# Patient Record
Sex: Male | Born: 1960 | Race: Black or African American | Hispanic: No | Smoking: Never smoker
Health system: Southern US, Community
[De-identification: ages and names within clinical notes are randomized; demographics above are authoritative.]

## PROBLEM LIST (undated history)

## (undated) DIAGNOSIS — I10 Essential (primary) hypertension: Secondary | ICD-10-CM

## (undated) DIAGNOSIS — I639 Cerebral infarction, unspecified: Secondary | ICD-10-CM

## (undated) DIAGNOSIS — K219 Gastro-esophageal reflux disease without esophagitis: Secondary | ICD-10-CM

## (undated) HISTORY — PX: BACK SURGERY: SHX140

## (undated) HISTORY — PX: FOOT SURGERY: SHX648

## (undated) HISTORY — PX: HERNIA REPAIR: SHX51

---

## 1998-02-15 ENCOUNTER — Emergency Department (HOSPITAL_COMMUNITY): Admission: EM | Admit: 1998-02-15 | Discharge: 1998-02-15 | Payer: Self-pay | Admitting: Emergency Medicine

## 2000-01-30 ENCOUNTER — Emergency Department (HOSPITAL_COMMUNITY): Admission: EM | Admit: 2000-01-30 | Discharge: 2000-01-30 | Payer: Self-pay | Admitting: Emergency Medicine

## 2000-01-30 ENCOUNTER — Encounter: Payer: Self-pay | Admitting: Emergency Medicine

## 2001-01-05 ENCOUNTER — Encounter: Payer: Self-pay | Admitting: Emergency Medicine

## 2001-01-05 ENCOUNTER — Emergency Department (HOSPITAL_COMMUNITY): Admission: EM | Admit: 2001-01-05 | Discharge: 2001-01-05 | Payer: Self-pay | Admitting: Emergency Medicine

## 2002-03-24 ENCOUNTER — Emergency Department (HOSPITAL_COMMUNITY): Admission: EM | Admit: 2002-03-24 | Discharge: 2002-03-24 | Payer: Self-pay | Admitting: Emergency Medicine

## 2002-03-24 ENCOUNTER — Encounter: Payer: Self-pay | Admitting: Emergency Medicine

## 2005-12-24 ENCOUNTER — Inpatient Hospital Stay (HOSPITAL_COMMUNITY): Admission: EM | Admit: 2005-12-24 | Discharge: 2005-12-31 | Payer: Self-pay | Admitting: Neurology

## 2005-12-24 ENCOUNTER — Ambulatory Visit: Payer: Self-pay | Admitting: Physical Medicine & Rehabilitation

## 2005-12-24 ENCOUNTER — Encounter: Payer: Self-pay | Admitting: Emergency Medicine

## 2005-12-24 ENCOUNTER — Ambulatory Visit: Payer: Self-pay | Admitting: Internal Medicine

## 2005-12-29 ENCOUNTER — Encounter: Payer: Self-pay | Admitting: Vascular Surgery

## 2005-12-31 ENCOUNTER — Inpatient Hospital Stay (HOSPITAL_COMMUNITY)
Admission: RE | Admit: 2005-12-31 | Discharge: 2006-01-28 | Payer: Self-pay | Admitting: Physical Medicine & Rehabilitation

## 2006-01-08 ENCOUNTER — Encounter: Payer: Self-pay | Admitting: Vascular Surgery

## 2006-01-26 ENCOUNTER — Ambulatory Visit: Payer: Self-pay | Admitting: Physical Medicine & Rehabilitation

## 2006-02-02 ENCOUNTER — Encounter
Admission: RE | Admit: 2006-02-02 | Discharge: 2006-05-03 | Payer: Self-pay | Admitting: Physical Medicine & Rehabilitation

## 2006-03-02 ENCOUNTER — Ambulatory Visit: Payer: Self-pay | Admitting: Physical Medicine & Rehabilitation

## 2006-03-02 ENCOUNTER — Encounter
Admission: RE | Admit: 2006-03-02 | Discharge: 2006-05-31 | Payer: Self-pay | Admitting: Physical Medicine & Rehabilitation

## 2006-05-04 ENCOUNTER — Encounter
Admission: RE | Admit: 2006-05-04 | Discharge: 2006-08-02 | Payer: Self-pay | Admitting: Physical Medicine & Rehabilitation

## 2007-06-25 ENCOUNTER — Emergency Department (HOSPITAL_COMMUNITY): Admission: EM | Admit: 2007-06-25 | Discharge: 2007-06-25 | Payer: Self-pay | Admitting: Emergency Medicine

## 2007-10-05 ENCOUNTER — Emergency Department (HOSPITAL_COMMUNITY): Admission: EM | Admit: 2007-10-05 | Discharge: 2007-10-06 | Payer: Self-pay | Admitting: Emergency Medicine

## 2008-01-05 ENCOUNTER — Emergency Department (HOSPITAL_COMMUNITY): Admission: EM | Admit: 2008-01-05 | Discharge: 2008-01-05 | Payer: Self-pay | Admitting: Emergency Medicine

## 2008-05-05 ENCOUNTER — Ambulatory Visit (HOSPITAL_COMMUNITY): Admission: RE | Admit: 2008-05-05 | Discharge: 2008-05-05 | Payer: Self-pay | Admitting: Internal Medicine

## 2008-05-10 ENCOUNTER — Observation Stay (HOSPITAL_COMMUNITY): Admission: RE | Admit: 2008-05-10 | Discharge: 2008-05-11 | Payer: Self-pay | Admitting: General Surgery

## 2008-05-10 ENCOUNTER — Encounter (INDEPENDENT_AMBULATORY_CARE_PROVIDER_SITE_OTHER): Payer: Self-pay | Admitting: General Surgery

## 2008-05-11 ENCOUNTER — Emergency Department (HOSPITAL_COMMUNITY): Admission: EM | Admit: 2008-05-11 | Discharge: 2008-05-11 | Payer: Self-pay | Admitting: Emergency Medicine

## 2008-09-13 ENCOUNTER — Emergency Department (HOSPITAL_COMMUNITY): Admission: EM | Admit: 2008-09-13 | Discharge: 2008-09-14 | Payer: Self-pay | Admitting: Emergency Medicine

## 2008-10-25 ENCOUNTER — Ambulatory Visit (HOSPITAL_COMMUNITY): Admission: RE | Admit: 2008-10-25 | Discharge: 2008-10-25 | Payer: Self-pay | Admitting: Neurosurgery

## 2008-11-01 ENCOUNTER — Inpatient Hospital Stay (HOSPITAL_COMMUNITY): Admission: RE | Admit: 2008-11-01 | Discharge: 2008-11-03 | Payer: Self-pay | Admitting: Neurosurgery

## 2008-11-10 ENCOUNTER — Ambulatory Visit (HOSPITAL_COMMUNITY): Admission: RE | Admit: 2008-11-10 | Discharge: 2008-11-10 | Payer: Self-pay | Admitting: Neurosurgery

## 2009-02-25 ENCOUNTER — Emergency Department (HOSPITAL_COMMUNITY): Admission: EM | Admit: 2009-02-25 | Discharge: 2009-02-25 | Payer: Self-pay | Admitting: Emergency Medicine

## 2009-02-26 ENCOUNTER — Ambulatory Visit (HOSPITAL_COMMUNITY): Admission: RE | Admit: 2009-02-26 | Discharge: 2009-02-26 | Payer: Self-pay | Admitting: Emergency Medicine

## 2009-08-26 ENCOUNTER — Emergency Department (HOSPITAL_COMMUNITY): Admission: EM | Admit: 2009-08-26 | Discharge: 2009-08-26 | Payer: Self-pay | Admitting: Emergency Medicine

## 2009-11-23 ENCOUNTER — Ambulatory Visit (HOSPITAL_COMMUNITY): Admission: RE | Admit: 2009-11-23 | Discharge: 2009-11-23 | Payer: Self-pay | Admitting: Podiatry

## 2009-12-04 ENCOUNTER — Encounter (HOSPITAL_COMMUNITY): Admission: RE | Admit: 2009-12-04 | Discharge: 2010-01-03 | Payer: Self-pay | Admitting: Podiatry

## 2011-01-27 LAB — URINALYSIS, ROUTINE W REFLEX MICROSCOPIC
Bilirubin Urine: NEGATIVE
Glucose, UA: NEGATIVE mg/dL
Nitrite: NEGATIVE
Protein, ur: 100 mg/dL — AB
Specific Gravity, Urine: 1.015 (ref 1.005–1.030)
Urobilinogen, UA: 1 mg/dL (ref 0.0–1.0)
pH: 7 (ref 5.0–8.0)

## 2011-01-27 LAB — COMPREHENSIVE METABOLIC PANEL
Albumin: 4 g/dL (ref 3.5–5.2)
Albumin: 4.2 g/dL (ref 3.5–5.2)
Alkaline Phosphatase: 84 U/L (ref 39–117)
Alkaline Phosphatase: 111 U/L (ref 39–117)
BUN: 8 mg/dL (ref 6–23)
BUN: 9 mg/dL (ref 6–23)
CO2: 32 mEq/L (ref 19–32)
Calcium: 9.4 mg/dL (ref 8.4–10.5)
Calcium: 9.5 mg/dL (ref 8.4–10.5)
Creatinine, Ser: 1 mg/dL (ref 0.4–1.5)
GFR calc non Af Amer: >60 mL/min (ref >60)
Glucose, Bld: 80 mg/dL (ref 70–99)
Potassium: 3.3 mEq/L — ABNORMAL LOW (ref 3.5–5.1)
Potassium: 3.7 mEq/L (ref 3.5–5.1)
Total Bilirubin: 0.8 mg/dL (ref 0.3–1.2)
Total Protein: 6.8 g/dL (ref 6.0–8.3)
Total Protein: 8.3 g/dL (ref 6.0–8.3)

## 2011-01-27 LAB — CBC
HCT: 42.7 % (ref 39.0–52.0)
MCHC: 32.6 g/dL (ref 30.0–36.0)
MCV: 86.9 fL (ref 78.0–100.0)
Platelets: 360 10*3/uL (ref 150–400)
RDW: 15 % (ref 11.5–15.5)
WBC: 8.9 10*3/uL (ref 4.0–10.5)

## 2011-01-27 LAB — DIFFERENTIAL
Basophils Relative: 1 % (ref 0–1)
Basophils Relative: 0 % (ref 0–1)
Lymphocytes Relative: 21 % (ref 12–46)
Lymphocytes Relative: 21 % (ref 12–46)
Lymphs Abs: 1.8 10*3/uL (ref 0.7–4.0)
Lymphs Abs: 2.4 10*3/uL (ref 0.7–4.0)
Monocytes Absolute: 0.8 10*3/uL (ref 0.1–1.0)
Monocytes Relative: 8 % (ref 3–12)
Monocytes Relative: 7 % (ref 3–12)
Neutro Abs: 6.3 10*3/uL (ref 1.7–7.7)
Neutro Abs: 8 10*3/uL — ABNORMAL HIGH (ref 1.7–7.7)

## 2011-01-27 LAB — BASIC METABOLIC PANEL
BUN: 12 mg/dL (ref 6–23)
CO2: 29 mEq/L (ref 19–32)
Chloride: 101 mEq/L (ref 96–112)
Chloride: 98 mEq/L (ref 96–112)
Creatinine, Ser: 0.92 mg/dL (ref 0.4–1.5)
Creatinine, Ser: 1 mg/dL (ref 0.4–1.5)
GFR calc Af Amer: >60 mL/min (ref >60)
GFR calc non Af Amer: >60 mL/min (ref >60)
GFR calc non Af Amer: >60 mL/min (ref >60)
Glucose, Bld: 105 mg/dL — ABNORMAL HIGH (ref 70–99)
Potassium: 2.6 mEq/L — CL (ref 3.5–5.1)
Potassium: 2.6 mEq/L — CL (ref 3.5–5.1)
Sodium: 135 mEq/L (ref 135–145)
Sodium: 141 mEq/L (ref 135–145)

## 2011-01-27 LAB — APTT: aPTT: 45 seconds — ABNORMAL HIGH (ref 24–37)

## 2011-01-27 LAB — URINE MICROSCOPIC-ADD ON

## 2011-02-25 NOTE — Op Note (Signed)
NAMETHERAN, VANDERGRIFT            ACCOUNT NO.:  [id number]   MEDICAL RECORD NO.:  [id number]          PATIENT TYPE:  INP   LOCATION:  3004                         FACILITY:  [facility]   PHYSICIAN:  Payton Doughty, M.D.      DATE OF BIRTH:  07/22/1961   DATE OF PROCEDURE:  11/01/2008  DATE OF DISCHARGE:                               OPERATIVE REPORT   PREOPERATIVE DIAGNOSIS:  Herniated disk on the left side, L4-5.   POSTOPERATIVE DIAGNOSIS:  Herniated disk on the left side, L4-5.   OPERATIVE PROCEDURE:  Left L4-5 laminectomy, diskectomy.   SURGEON:  Payton Doughty, MD   PREPARATION:  Prepped and draped with alcohol wipe.   COMPLICATIONS:  None.   ASSISTANT:  Danae Orleans. Venetia Maxon, MD   BODY OF TEXT:  A 49 year old with the disk at 4-5 on the left, taken to  operating room, smoothly anesthetized and intubated, placed prone on the  operating table.  Following shave, prep, and drape in usual sterile  fashion, skin was infiltrated with 1% lidocaine with 1:400,00  epinephrine.  Skin was incised over the 4-5 interspace.  The left L4  lamina was dissected free in subperiosteal plane.  Intraoperative x-ray  confirmed correctness level.  Having confirmed correctness level, hemi-  semi-laminectomy was carried out to the top of ligamentum flavum that  was removed in a retrograde fashion.  This allowed exposure of the  lateral aspect of the thecal sac as well as the five root as it  traversed.  This area was gently retracted medially.  A large herniated  disk was found underneath it and removed without difficulty.  The disk  space was entered and other fragments were removed.  This resulted in  immediate decompression of left L5 root.  Root was explored carefully  and found to be free.  Wound was irrigated.  Hemostasis assured.  Depo-  Medrol soaked cotton was placed in the laminectomy defect.  Successive  layers of 0-Vicryl, 2-0 Vicryl, and 3-0 nylon were used to close.  Betadine and Telfa dressing was  applied, made occlusive with OpSite, and  the patient returned recovery room in good condition.           ______________________________  Payton Doughty, M.D.     MWR/MEDQ  D:  11/01/2008  T:  11/02/2008  Job:  [id number]

## 2011-02-25 NOTE — Op Note (Signed)
NAMELEOCADIO, HEAL            ACCOUNT NO.:  [id number]   MEDICAL RECORD NO.:  [id number]          PATIENT TYPE:  OBV   LOCATION:  A305                          FACILITY:  [facility]   PHYSICIAN:  Barbaraann Barthel, M.D. DATE OF BIRTH:  [birth year]   DATE OF PROCEDURE:  05/10/2008  DATE OF DISCHARGE:                               OPERATIVE REPORT   SURGEON:  Barbaraann Barthel, MD   PREOPERATIVE DIAGNOSIS:  Umbilical hernia.   POSTOPERATIVE DIAGNOSIS:  Umbilical hernia.   PROCEDURE:  Umbilical herniorrhaphy.   NOTE:  This is a 50 year old black male who is status post  cerebrovascular accident who had an incarcerated umbilical hernia for  quite some time.  We planned for an elective repair.  He was referred  from the [facility] [place].   GROSS OPERATIVE FINDINGS:  The patient had some incarcerated omentum and  small umbilical hernia defect which did not require mesh for repair.   TECHNIQUE:  The patient was placed in the supine position, and after the  adequate administration of general anesthesia via endotracheal  intubation his abdomen was prepped with Betadine solution and draped in  the usual manner.  A periumbilical incision was carried out over the  inferior aspect of the umbilicus.  The skin and subcutaneous tissue was  incised down the fascia.  The hernia sac was dissected free from the  skin of the umbilicus.  There was a small defect about the size of a  quarter, which was closed with interrupted 0 Polysorb suture.  This was  done using figure-of-eight sutures.  I then tacked the umbilicus down to  the fascia restoring its natural concave position, irrigated with normal  saline solution, and closed the skin with stapling device.  Prior to  closure, all sponge, needle, and instrument counts were found to be  correct.  Estimated blood loss was minimal.  The patient received 500 mL  of crystalloids intraoperatively.  No drains were placed.  There were no  complications.      Barbaraann Barthel, M.D.  Electronically Signed     WB/MEDQ  D:  05/10/2008  T:  05/10/2008  Job:  [id number]   cc:   [facility]

## 2011-02-25 NOTE — H&P (Signed)
NAMEKAZUMA, ELENA            ACCOUNT NO.:  [id number]   MEDICAL RECORD NO.:  [id number]          PATIENT TYPE:  INP   LOCATION:  3004                         FACILITY:  [facility]   PHYSICIAN:  Payton Doughty, M.D.      DATE OF BIRTH:  [birth year]   DATE OF ADMISSION:  11/01/2008  DATE OF DISCHARGE:                              HISTORY & PHYSICAL   ADMITTING DIAGNOSIS:  Herniated disk on the left side at L4-5.   SURGEON:  Payton Doughty, MD   BODY OF TEXT:  Dwaine Pringle is a 50 year old right-handed black  gentleman 6 months had increasing pain down his back to his left lower  extremity.  MR showed herniated disk at L4-5.  He was sent to me.  He  underwent epidural steroids to no avail and he is admitted now for  diskectomy.  He was going to be admitted last week because his potassium  was low, now it is okay and he is admitted.   MEDICAL HISTORY:  Remarkable for:  1. Stroke few years ago.  2. Left hemiparesis.  3. He has hypertension.   MEDICATIONS:  He is on Neurontin, omega-3 fish oil, potassium,  hydrochlorothiazide, simvastatin, Norvasc and vitamin E.   ALLERGIES:  None.   SURGICAL HISTORY:  Hernia.   SOCIAL HISTORY:  He does not smoke or drink.  He is on disability.   FAMILY HISTORY:  Mother 84 and father 104.  There is a history of  hypertension in the family.   REVIEW OF SYSTEMS:  Remarkable for hypertension, hypercholesterolemia,  swelling in hands and feet, leg pain, back pain, and arm pain.   PHYSICAL EXAMINATION:  HEENT:  Within normal limits.  NECK:  He has reasonable range of motion in neck.  CHEST:  Clear.  CARDIAC:  Regular rate and rhythm.  ABDOMEN: Large but nontender with no hepatosplenomegaly.  EXTREMITIES:  No clubbing, cyanosis.  GU:  Deferred.  Peripheral pulses are good.  NEUROLOGIC:  He is awake, alert and oriented.  His cranial nerves are  intact.  Motor exam shows 5/5 strength throughout the upper and lower  extremities except for the left  side which is hemiparetic from his  stroke.  He has a positive straight leg raise on the left.   MR shows herniated disk at L4-5 on the left.   CLINICAL IMPRESSION:  Left L5 radiculopathy related to herniated disk at  L4-5.   PLAN:  Left L4-5 laminectomy and diskectomy.  The risks and benefits  have been discussed with him and he wished to proceed.    .           ______________________________  Payton Doughty, M.D.     MWR/MEDQ  D:  11/01/2008  T:  11/02/2008  Job:  [id number]

## 2011-02-28 NOTE — H&P (Signed)
Scott Ritter, Scott Ritter            ACCOUNT NO.:  [id number]   MEDICAL RECORD NO.:  [id number]          PATIENT TYPE:  INP   LOCATION:  2909                         FACILITY:  [facility]   PHYSICIAN:  Marlan Palau, M.D.  DATE OF BIRTH:  [birth year]   DATE OF ADMISSION:  12/24/2005  DATE OF DISCHARGE:                                HISTORY & PHYSICAL   HISTORY OF PRESENT ILLNESS:  Scott Ritter is a 50 year old right-handed  black male born [birth year] with a history of hypertension followed by Dr.  Gareth Morgan in the [place] area.  Patient has been on a blood pressure  pill but family cannot remember the name of the medication.  Patient  otherwise has been in good health.  Patient was noted to have some problems  with speech, slight confusion beginning around 7:30 p.m. tonight.  Patient  went to the [facility] Room and underwent CT of the head showing  evidence of a right basilar gangliar hemorrhage without intraventricular  extension or significant shift.  Size of the hemorrhage was noted to be  around 32 x 33 mm.  Patient was transported via [facility] to [facility].  En route patient was noted to have agitation and then became  quite somnolent.  Patient developed bradycardia as well with rates in the  mid 50s.  Patient would occasionally drop down in the mid 40s.  Patient has  remained somnolent upon arrival to [facility].  Patient had been placed on a  Cardene drip initially.  This was discontinued en route and patient's blood  pressure has not elevated.  Heart rates, again, remained in the upper 50s to  around 60.  Patient is being admitted to stroke M.D.   PAST MEDICAL HISTORY:  1.  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  2.  Hypertension.   MEDICATIONS:  Blood pressure pill, unknown type.   Patient has no known allergies.  Not smoke or drink.   SOCIAL HISTORY:  This  patient is engaged to be married next week.  Works at  [employer].  Has one son, one daughter who are alive and well.   FAMILY HISTORY:  Mother is alive and well.  Father is alive, has history of  hypertension.  History of strokes on the father's side of the family.  Patient has four brothers and sisters who are alive and well.   REVIEW OF SYSTEMS:  Cannot be directly obtained from the patient.  Patient  did complain of a headache around the time the slurred speech began.  Developed a left hemiparesis.  Was really feeling fairly good prior to that.   PHYSICAL EXAMINATION:  VITAL SIGNS:  Blood pressure approximately 169/90,  heart rate 60, respiratory rate 18, temperature afebrile.  GENERAL:  This patient is a fairly well-developed, black male who is very  lethargic at the time of the examination.  HEENT:  Head is atraumatic.  Eyes:  Pupils are 2-3 mm, reactive.  Disks are  flat.  NECK:  Supple.  No carotid bruits noted.  RESPIRATORY:  Clear.  CARDIOVASCULAR:  Regular rate and rhythm.  No obvious murmur, rubs noted.  ABDOMEN:  Decreased bowel sounds.  No organomegaly or tenderness noted.  EXTREMITIES:  Without significant edema.  NEUROLOGIC:  Cranial nerves as above.  Facial symmetry reveals minimal  depression of the left nasal labial fold.  Patient at times has divergent  gaze, other times is able to conjugately deviate the eyes from one side to  the next.  Does not really blink from either side.  When stimulated patient  may say a word or two that is relatively unintelligible.  Will occasionally  follow some verbal commands, but then doze off to sleep.  Patient does seem  to grimace some to deep pain stimulation on all fours, more so on the right  than the left.  Patient has inability to maintain the left arm above head  against gravity.  Can perform this on the right.  Can grip with the right  hand.  Cannot keep the left leg up off the bed.  Can perform this to some  degree with  the right leg.  Deep tendon reflexes are depressed, but  symmetric. Toe on the left is neutral, maybe slightly upgoing toe on the  right.  Patient was not alert enough to follow cerebellar testing commands  and was really not alert enough to participate in a more detailed sensory  examination.  NIH stroke scale was 25.   LABORATORIES:  Notable for a white count of 9.7, hemoglobin of 12.7,  hematocrit of 37.8, MCV of 85.3, platelets of 306.  Sodium 140, potassium  3.1, chloride of 107, CO2 28, glucose of 111, BUN of 17, creatinine of 1.2,  calcium of 8.9.  Urine drug screen was negative.  Urinalysis revealed  specific gravity of 1.02, pH of 7.5, red cells 3-6, negative white cells.   Only medication given to the [facility] ER was 20 mg of labetalol and 2 mg of  morphine.  No other sedative medications were given.   EKG, chest x-ray are pending.   IMPRESSION:  1.  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  2.  History of hypertension.  3.  Hypokalemia on admission.   This patient appears to have a fairly significant deficit with the left  hemiparesis, but apparently has had a change in mental status en route with  initially some agitation, but now has significant lethargy/stuporous state  that may indicate an extension of the intracranial hemorrhage from the  original CT scan.  Need to pursue further testing at this point.  The  ensuing bradycardia may be a manifestation of increased intracranial  pressure.   PLAN:  1.  Repeat CT scan of the head.  2.  Neurosurgical consultation.  3.  Consider cardiology evaluation if bradycardia becomes significant.  4.  Replenish potassium.  5.  n.p.o.  6.  Will repeat blood work and CT scan of the head in a.m.  I have discussed      the case with the family.      Marlan Palau, M.D.  Electronically Signed    CKW/MEDQ  D:  12/25/2005  T:  12/25/2005  Job:  [id number]   cc:   Mila Homer. Sudie Bailey, M.D.  Fax:  [phone]   Garrison [place]. Yetta Barre, M.D.  Fax: [phone]

## 2011-02-28 NOTE — H&P (Signed)
NAMESAMMY, CASSAR            ACCOUNT NO.:  [id number]   MEDICAL RECORD NO.:  [id number]          PATIENT TYPE:  IPS   LOCATION:  4029                         FACILITY:  [facility]   PHYSICIAN:  Ranelle Oyster, M.D.DATE OF BIRTH:  09-10-1961   DATE OF ADMISSION:  12/31/2005  DATE OF DISCHARGE:                                HISTORY & PHYSICAL   CHIEF COMPLAINT:  Left-sided weakness, dysarthria, and dysphagia.   HISTORY OF PRESENT ILLNESS:  This is a 50 year old black male with history  of hypertension admitted to [facility] with speech problems and  mental status changes. On admission he was found to have a right basal  ganglia hemorrhage. The patient developed increasing left hemiparesis around  the time of his admit. The patient developed bradycardia which was felt to  be secondary to his Cardizem drip versus Cushing's reflex. The patient was  treated x1 with mannitol for increased intracranial pressure and lethargy  with improvement of mental status. The patient displayed ongoing improvement  in his mental status. He was found to have oral phase dysphagia with  spillage and is currently on D2 thin liquid diet. Followup head scan on  March 17 revealed stable right basal ganglia hematoma, approximately 6 mm  right-to-left midline shift. The patient had surrounding edema as well.   REVIEW OF SYSTEMS:  Negative for any shortness of breath, chest pain, sleep  difficulties. Other pertinent positives are listed above. The patient has  displayed a flat affect during this hospitalization.   PAST MEDICAL HISTORY:  Positive for hypertension. The patient has taken no  medications for a year.   FAMILY HISTORY:  Positive for stroke and hypertension.   SOCIAL HISTORY:  The patient lives with his fiance and three children in a  two-level house. The patient was independent prior to arrival and worked for  [employer].   MEDICATIONS PRIOR TO ARRIVAL:  Medications prior to arrival  which he had  stopped include Lotrel and hydrochlorothiazide. He uses ibuprofen and Aleve  p.r.n.   ALLERGIES:  No known drug allergies.   PHYSICAL EXAMINATION:  VITAL SIGNS:  Blood pressure is 124/82, pulse is 82,  respiratory rate is 16. He is afebrile.  GENERAL:  The patient is generally flat, in no acute distress.  HEENT:  Pupils equal, round, and reactive to light and accommodation.  Extraocular eye movements are intact. Ear, nose, and throat exam was  unremarkable except for poor dentition.  NECK:  Supple without JVD or lymphadenopathy.  CHEST:  Clear to auscultation bilaterally without wheezes, rales, or  rhonchi.  HEART:  Regular rate and rhythm without murmur, rubs, or gallops.  EXTREMITIES:  Show no clubbing, cyanosis, or edema.  ABDOMEN:  Soft, nontender, bowel sounds positive.  NEUROLOGIC:  Cranial nerve exam revealed a positive left central VII and  XII. He had left inattention today. His left arm and leg were flaccid. He  had 1+ reflexes at the elbow, knee, and ankle today. The patient had some  problems with his visual acuity. He was slow to respond and lacked  initiative and awareness, as well as attention today.  He is alert to name  and place. Memory was fair. There was a large mood component to his  participation in the neurological exam today. Right-sided motor function was  5/5 and as stated before, left side was 0/5.   ASSESSMENT AND PLAN:  1.  Functional deficit secondary to right basal ganglia hemorrhage with      flaccid left hemiparesis, dysphagia, inattention, and dysarthria. Begin      comprehensive inpatient rehab with PT, OT, and speech. PT will assess      for mobility, lower extremity strengthening. OT will assess for self      care and upper extremities. Speech will assess for swallowing,      dysphagia, and cognitive deficits. Nursing will manage bowel, bladder,      skin, and medications. Psychology to assess for mood. Nurse case manager      and  social work to assess for psychosocial needs. Estimated length of      stay is 3 weeks. Prognosis fair to good. Goal is supervision to minimum      assist.  2.  Deep venous thrombosis prophylaxis with TEDs and PAS hose.  3.  Hypertension. Continue hydrochlorothiazide daily.  4.  Acute renal insufficiency. Push p.o. fluids.  5.  Lethargy. The patient may benefit from Ritalin trial.  6.  Dyslipidemia:  Zocor.  7.  Hypokalemia:  Check admission labs.  8.  Hyperhomocysteinemia:  Continue Foltx.      Ranelle Oyster, M.D.  Electronically Signed     ZTS/MEDQ  D:  12/31/2005  T:  01/01/2006  Job:  [id number]

## 2011-02-28 NOTE — Assessment & Plan Note (Signed)
A 50 year old male with history of hypertension.  Had a right basal ganglia  intracranial hemorrhage on December 24, 2005.  He was admitted to  rehabilitation on December 31, 2005 and stayed in rehabilitation until January 28, 2006.  He was discharged to home using an AFO requiring total assistance  for ambulation.  He is now ambulating with a wheeled walker with supervision  assistance.  He has followed up with Dr. Donette Larry who has increased his  Altace, but otherwise kept all medications the same.  Patient has had no  falls.  He has had no aspiration.  He has had no seizures.  No depression.   He takes Tylenol at night for left shoulder pain.  Shoulder pain has  responded to OT scapular mobilization treatments.  Sleep is good.   He uses a wheelchair out in the community.  He has not worked.  He has not  driven since his stroke.   REVIEW OF SYSTEMS:  Swelling left foot.  Numbness and tingling in the left  fingers.   PHYSICAL EXAMINATION:  VITAL SIGNS:  Blood pressure 143/73, pulse 66,  respiratory rate 17, O2 saturation 99% room air.  GENERAL:  No acute distress.  Mood and affect appropriate.  NEUROLOGIC:  His left upper extremity has no evidence of subluxation.  He  has 2- at the shoulder retractors, trace at the biceps, trace shoulder  adduction on the left side.  He does have increased tone in the left  pectoralis musculature.  He has pain with passive external rotation of his  left humerus.   He has no swelling in his left hand.  No cyanosis or erythema.  No  hypersensitivity to touch.  His left lower extremity strength is 4- at the  knee extensor and 3- at the ankle dorsiflexor.  His gait is with stand-by  assistance, widened base support, decreased step length, and decreased toe  clearance on the left compared to the right side.   He has absent sensation to light touch in the left hand.  His visual  confrontation testing is normal.   IMPRESSION:  1.  Right intracranial hemorrhage  basal ganglia causing left upper      extremity, lower extremity spastic hemiplegia.  2.  Left hemisensory deficits secondary to above.  No visual neglect or      visual field cut noted.  3.  Dysphagia, improved.   PLAN:  1.  Continue PT and OT.  2.  Discussed driving.  3.  Discussed work/disability issues.  I think there is a possibility that      he can go back to a sedentary type of      position in six more months or so, but will need to monitor his      rehabilitation progress.  Discuss driving at next visit, but none until      then.  He will probably need OT driver's evaluation.      Erick Colace, M.D.  Electronically Signed     AEK/MedQ  D:  03/03/2006 10:54:45  T:  03/03/2006 15:07:55  Job #:  [id number]   cc:   Georgann Housekeeper, MD  Fax: [phone]   Pramod P. Pearlean Brownie, MD  Fax: [phone]

## 2011-02-28 NOTE — Discharge Summary (Signed)
NAMEHASSEN, Scott Ritter            ACCOUNT NO.:  [id number]   MEDICAL RECORD NO.:  [id number]          PATIENT TYPE:  IPS   LOCATION:  4149                         FACILITY:  [facility]   PHYSICIAN:  Erick Colace, M.D.DATE OF BIRTH:  [birth year]   DATE OF ADMISSION:  12/31/2005  DATE OF DISCHARGE:  01/28/2006                                 DISCHARGE SUMMARY   DISCHARGE DIAGNOSES:  1.  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  2.  Hypertension.  3.  Acute renal insufficiency, resolved.  4.  Pericarditis treated.  5.  Dyslipidemia.  6.  Hypokalemia, resolved.   HISTORY OF PRESENT ILLNESS:  Scott Ritter is a 50 year old male with a  history of hypertension, no BP medications for the last few months admitted  via [facility] with speech problem and mental status changes  secondary to right basal ganglia hemorrhage. The patient noted to have  bradycardia in route as well as left hemiparesis noted at admission. Neuro  consulted for input and has been following along with conservative  management with serial CT scans. Initially patient on Cardizem drip for BP  management. However, he developed bradycardia and [facility] was  consulted for input. They question bradycardia secondary to Cushing reflex  due to stress, voice the side effect of Cardizem drip. This patient is noted  to have increase in intracranial pressure as well as lethargy and was  treated with one dose Mannitol with improvement in mental status. Other  workup include check with carotid duplex showing no ICA stenosis. . The  patient's mentation improved. The patient was noted to have oral phase  dysphagia with spillage and is currently on D2 diet with thin liquids. The  patient continued with left hemiparesis, restricted upward gaze, dysarthria  and inattention.   LABORATORY DATA:  CT of head from March 17 showed stable right basal ganglia  hematoma with 6 mm right to left midline shift  with surrounding edema. The  patient also noted to have elevated homocystine level at 17.5 and was  started on Foltx. Currently the patient continues with hypersomnia and  lethargy. Blood pressures borderline control with some renal insufficiency.  BUN 25, creatinine 1.2 onHCl  50 mg a day. Rehab consulted for progressive  therapy secondary to impairment and self care and mobility.   PAST MEDICAL HISTORY:  Significant for hypertension not treated for past few  months.   ALLERGIES:  No known drug allergies.   FAMILY HISTORY:  Positive for CVA and hypertension.   SOCIAL HISTORY:  The patient lives with fiance and 2 children in a two-level  home with 3 steps at entry, 5 steps to bedroom. Does not use any tobacco,  uses alcohol occasionally.   HOSPITAL COURSE:  Scott Ritter was admitted to rehab on December 31, 2005. The patient's therapies to consist of PT/OT daily. Past admission  followup labs done revealed hemoglobin 14.0, hematocrit 41.0, white count  14.6, platelets 461. Check of lytes showed mild renal insufficiency with BUN  24, creatinine 1.1. The patient's HCTZ was decreased to 25 mg q.a.m. and  patient was started  on Altace 2.5 mg p.o. per day for BP control. On March  26, the patient was noted to have a temperature spike of 101.1. Blood  cultures x2 were drawn including check of labs showing patient with  worsening renal insufficiency with BUN 30, creatinine 1.1, leukocytosis  noted with white count at 15.4. Chest x-ray done showed left basilar  atelectasis and small left effusion. A followup CT of head was done as  question edema pressure as cause of fever. CT of head showed right basal  ganglia, hematoma to be slightly decreased with decrease in subfalcine  herniation. The patient was noted to have some fullness of the right face  with fever and leukocytosis. This was contributed to pericarditis. He was  treated with IV cefuroxime from March 28 to April 2 and also  hydrated with  IV fluids. Bilateral lower extremity Doppler's were also done to rule out  DVT secondary to the patient's left hemiparesis. This was noted to be  negative. On April 2, the patient was changed over to p.o. amoxicillin;  however, this did not provide good coverage as the patient respiked temp on  April 6. He was changed over to Augmentin 825 mg p.o. b.i.d. and was  continued on this through January 28, 2006 to complete treatment for  pericarditis. The most recent check of electrolytes from April 4 which shows  hyponatremia and renal insufficiency have resolved with sodium 137,  potassium 3.6, chloride 104, CO2 25, BUN 13, creatinine 1.1, glucose 106.  CBC check of April 4 shows hemoglobin 11.0, hematocrit 32.2, white count  9.2, platelets 478. Blood cultures checked on February 26 and April 6 both  show no growth.   The patient's mood has been stable. He has participated in therapy. He has  been continent of bowel and bladder. He has had issues with constipation  that was treated with Dulcolax tabs 2 p.o. q.h.s. as Senokot S ineffective.  __________ was carried out to help assist with his left facial droop and  this has helped greatly. Currently the patient has been advanced to D3 diet  with thin liquids and is tolerating this without difficulty. The patient's  blood pressures have been monitored on a b.i.d. basis with addition of meds  with tighter control. At the time of discharge, blood pressures ranging from  120s to 130s systolic, 80s diastolic on Altace 5 mg b.i.d., Norvasc 5 mg a  day, 25 mg a day and Lopressor 50 mg b.i.d. Heart rate stable from 70s to  high 80s on current dose of beta blocker. Currently the patient is at  supervision level for supervision mod independent,  to follow speech,  swallowing function exercises. He is able to attend to tasks at supervision  level. He does continue with left hemiparesis, left upper greater than left lower. A left AFO  was  ordered to help with gait. Currently the patient is  able to ambulate 20 feet x4 with__________  total assist to help facilitate  left hip and for quad control. He is min assist with standwith transfers. He  requires moderate assist for upper extremity support for dynamic standing  balance. The patient is at min assist for self care. The patient will  continue to receive further followup outpatient PT/OT at [facility] beginning February 02, 2006. On January 28, 2006, patient is  discharged to home.   DISCHARGE MEDICATIONS:  1.  Norvasc 5 mg a day.  2.__________  25 mg a day.  1.  Altace 5 mg b.i.d.  2.  Foltx 1 per day.  3.  Lopressor 50 mg b.i.d.  4.  Prevacid 15 mg a day p.r.n.  5.  K-Dur 20 mEq a day.  6.  Dulcolax tabs 2 p.o. q.h.s.  7.  Zocor 20 mg q.h.s.   DIET:  Low fat.   ACTIVITY:  As toleratedwalk only with assistance.   SPECIAL INSTRUCTIONS:  No alcohol, no aspirin, no aspirin containing  products. May use Tylenol p.r.n. for pain.   FOLLOW UP:  The patient to followup with Dr. Wynn Banker on May 11 at 9:30,  followup with Dr. Pearlean Brownie in 4-6 weeks, followup with Dr. Donette Larry in 2-3 weeks  for recheck blood pressure and medical management.      Greg Cutter, P.A.      Erick Colace, M.D.  Electronically Signed    PP/MEDQ  D:  01/28/2006  T:  01/29/2006  Job:  [id number]   cc:   Georgann Housekeeper, MD  Fax: [phone]   Pramod P. Pearlean Brownie, MD  Fax: [phone]

## 2011-02-28 NOTE — Discharge Summary (Signed)
NAMESIGURD, PUGH            ACCOUNT NO.:  [id number]   MEDICAL RECORD NO.:  [id number]          PATIENT TYPE:  INP   LOCATION:  3041                         FACILITY:  [facility]   PHYSICIAN:  Pramod P. Pearlean Brownie, MD    DATE OF BIRTH:  [birth year]   DATE OF ADMISSION:  12/24/2005  DATE OF DISCHARGE:  12/30/2005                                 DISCHARGE SUMMARY   DISCHARGE DIAGNOSES:  1.  Hypertensive right basal ganglia hemorrhage.  2.  Hypertension.  3.  Dysphagia.  4.  Hypercholesterolemia.  5.  Hyperhomocystinemia.   DISCHARGE MEDICATIONS:  1.  Protonix 40 mg a day.  2.  Hydrochlorothiazide 50 mg a day.  3.  Potassium 20 mEq a day.  4.  Lotensin 20 mg a day.   STUDIES PERFORMED:  1.  CT of the brain on admission shows acute intracranial hemorrhage in the      right lateral basal ganglia likely hypertensive.  There is 2.5 mm right-      to-left shift.  2.  Follow-up CT at 24 hours shows slightly interval increase in size of      interparenchymal hemorrhage at right basal ganglia with mild increase in      right-to-left shift.  3.  CT at 48 hours shows no significant acute change in the right basal      ganglia.  There is a 6 mm at midline shift with mass effect.  4.  CT of the head at 36 hours shows stable right basal ganglia hematoma      with 6 mm right-to-left shift.  5.  Abdominal x-ray shows Panda tube in the gastricum tip.  6.  Chest x-ray shows cardiomegaly without acute abnormality.  7.  EKG shows normal sinus rhythm with right axis deviation, pulmonary      disease patient, minimal voltage criteria for LVH, may be normal      variant.  8.  Carotid Doppler shows no ICA stenosis.  9.  A 2-D echocardiogram  has been completed, results are pending.  10. Transcranial Doppler was not ordered, not needed.   LABORATORY STUDIES:  CBC with hemoglobin 13.5 to 17.4, RDW 14.4 to 14.7,  otherwise normal, differential with neutrophils 90, lymphocytes 8, monocytes  2,  eosinophils 0, basophils 0.  Chemistry with sugars 120 to 132, creatinine  1.1 to 1.2.  Liver function tests normal.  Homocystine 17.5.  Cholesterol  208, triglycerides 186, HDL 41, LDL 130.   HISTORY OF PRESENT ILLNESS:  Scott Ritter is a 50 year old right-handed  African American male with a history of hypertension followed by Dr.  Sudie Bailey in [place], [place].  Patient has been on blood pressure  pill but family cannot remember the name of the medicine.  He has otherwise  been in good health.  The night of admission, he was noted to have some  problems with his speech and slight confusion that started around 7:30 p.m.  The patient went to [facility] and underwent a CT of  the head which showed a right basal ganglia hemorrhage without  interventricular extension or significant  shift.  Size of the hemorrhage was  around 32 x 33 mm.  Patient was transported via [facility] to [facility].  En route, patient was noticed to have agitation, then  became quite somnolent.  He developed bradycardia with rates in the 50s,  with occasional drops in the 40s.  Patient has remained somnolent upon  arrival to [facility]. [facility].  He was placed on a Cardene drip  initially, but this was discontinued en route as his blood pressure did not  elevate.  He was admitted to the hospital to the ICU for further stroke  evaluation.   HOSPITAL COURSE:  Patient was initially apparently placed on a Cardizem drip  but then changed to Cardene in the hospital for elevated blood pressures in  the 180s/100s.  He was initially very sedate and unable to swallow.  Panda  was placed for tube feeding administration and medicines.  He was started on  per tube blood pressure medicines and Cardene drip was able to be weaned.  He became more alert and was able to pass a swallowing evaluation and  tolerate a dysphagia II thin liquid diet.  Medicines were then  changed to  p.o. and patient was moved from the intensive care unit.  He did have one  episode of emergency elevated hypertension with bradycardia and apnea once  off Cardene.  He was difficult to arouse.  Blood pressure was 212/102 with  heart rates in the 30s.  He had episodes of apnea.  Dr. Orlin Hilding was called  in to see him emergently and was concerned about __________  triad with risk  for intracranial pressure related to his breathing.  It was discussed at  length with Dr. Gerlene Fee who felt there was no rule for surgery.  A one time  dose of mannitol was administered.  There was also some discussion that  BiPAP may have been the answer, however, he returned to normal breathing  pattern and this was not necessary.  This all occurred in the unit prior to  transfer to floor.  Once on the floor, patient was stabilized.  He does look  like he has some mild renal insufficiency.  He had newly diagnosed risk  factors of hypercholesterolemia and hyperhomocystinemia for which he was  started on Statin and Foltx, respectively. He remained with flaccid left  hemiparesis and was thought to be a good rehab candidate.   CONDITION ON DISCHARGE:  Patient alert and oriented x3.  Speech is  dysarthric.  Affect is flat.  He has left-sided neglect.  He has left  facial, he last left homonomous hemianopsia, no aphasia. He has flaccid left  hemiparesis with right strength okay.  He is able to follow one, two and  three-step commands.  He does withdraw to pain on the left.  His chest is  clear to auscultation.  His heart rate is regular.   DISCHARGE PLAN:  1.  Transfer to rehab for continuation of therapies.  2.  Aspirin in future after stabilized from hemorrhage.  3.  New Statin follow-up 6 to 8 weeks.  4.  New Foltx, will need to remain on life-long.  5.  Aggressive blood pressure control.  Rehab to follow, adjust medications      as needed. 6.  Follow-up primary care physician within one month of  discharge.  Follow-      up with Dr. Pearlean Brownie in two to three months.      Jasmine December  Biby, N.P.    ______________________________  Sunny Schlein. Pearlean Brownie, MD    SB/MEDQ  D:  12/30/2005  T:  12/31/2005  Job:  [id number]   cc:   Mila Homer. Sudie Bailey, M.D.  Fax: [phone]   Garrison [place]. Yetta Barre, M.D.  Fax: [phone]

## 2011-03-30 ENCOUNTER — Emergency Department (HOSPITAL_COMMUNITY)
Admission: EM | Admit: 2011-03-30 | Discharge: 2011-03-30 | Disposition: A | Discharge status: Home / Self Care | Payer: 59 | Attending: Emergency Medicine | Admitting: Emergency Medicine

## 2011-03-30 DIAGNOSIS — I10 Essential (primary) hypertension: Secondary | ICD-10-CM | POA: Insufficient documentation

## 2011-03-30 DIAGNOSIS — Z8673 Personal history of transient ischemic attack (TIA), and cerebral infarction without residual deficits: Secondary | ICD-10-CM | POA: Insufficient documentation

## 2011-03-30 DIAGNOSIS — M549 Dorsalgia, unspecified: Secondary | ICD-10-CM | POA: Insufficient documentation

## 2011-03-30 DIAGNOSIS — J069 Acute upper respiratory infection, unspecified: Secondary | ICD-10-CM | POA: Insufficient documentation

## 2011-03-30 LAB — RAPID STREP SCREEN (MED CTR MEBANE ONLY): Streptococcus, Group A Screen (Direct): NEGATIVE

## 2011-07-11 LAB — DIFFERENTIAL
Basophils Absolute: 0
Basophils Absolute: 0
Basophils Relative: 0
Basophils Relative: 1
Lymphocytes Relative: 23
Lymphocytes Relative: 7 — ABNORMAL LOW
Monocytes Absolute: 0.3
Monocytes Absolute: 0.6
Monocytes Relative: 8
Neutro Abs: 13.3 — ABNORMAL HIGH
Neutro Abs: 4.8
Neutrophils Relative %: 63
Neutrophils Relative %: 91 — ABNORMAL HIGH

## 2011-07-11 LAB — BASIC METABOLIC PANEL
Calcium: 9.5
Creatinine, Ser: 1.09
GFR calc Af Amer: >60
GFR calc non Af Amer: >60
Sodium: 138

## 2011-07-11 LAB — CBC
HCT: 41.3
Hemoglobin: 12.8 — ABNORMAL LOW
Hemoglobin: 13.8
MCV: 85.2
RBC: 4.48
RBC: 4.85
WBC: 14.6 — ABNORMAL HIGH

## 2011-07-11 LAB — COMPREHENSIVE METABOLIC PANEL
Albumin: 4.7
Alkaline Phosphatase: 89
BUN: 8
CO2: 28
Chloride: 99
Creatinine, Ser: 1.09
GFR calc non Af Amer: >60
Glucose, Bld: 118 — ABNORMAL HIGH
Potassium: 3 — ABNORMAL LOW
Total Bilirubin: 0.6

## 2011-07-11 LAB — URINALYSIS, ROUTINE W REFLEX MICROSCOPIC
Bilirubin Urine: NEGATIVE
Glucose, UA: NEGATIVE
Ketones, ur: NEGATIVE
Leukocytes, UA: NEGATIVE
Protein, ur: NEGATIVE
pH: 7

## 2011-07-11 LAB — LIPASE, BLOOD: Lipase: 23

## 2011-07-11 LAB — POCT I-STAT 4, (NA,K, GLUC, HGB,HCT)
Glucose, Bld: 101 — ABNORMAL HIGH
HCT: 42
Potassium: 3.1 — ABNORMAL LOW

## 2011-07-11 LAB — URINE MICROSCOPIC-ADD ON

## 2011-07-11 LAB — POTASSIUM: Potassium: 3.2 — ABNORMAL LOW

## 2011-07-18 LAB — CBC
MCHC: 34.3
RBC: 4.4

## 2011-07-18 LAB — DIFFERENTIAL
Basophils Relative: 1
Eosinophils Absolute: 0.2
Lymphs Abs: 2.1
Neutro Abs: 4.6
Neutrophils Relative %: 60

## 2011-07-18 LAB — POCT CARDIAC MARKERS
Operator id: 294341
Troponin i, poc: <0.05

## 2011-07-18 LAB — I-STAT 8, (EC8 V) (CONVERTED LAB)
Acid-Base Excess: 4 — ABNORMAL HIGH
Bicarbonate: 27.5 — ABNORMAL HIGH
Chloride: 105
HCT: 42
Operator id: 294341
Sodium: 138
pCO2, Ven: 38.3 — ABNORMAL LOW

## 2011-07-18 LAB — CK: Total CK: 296 — ABNORMAL HIGH

## 2011-07-18 LAB — D-DIMER, QUANTITATIVE: D-Dimer, Quant: 0.22

## 2011-11-09 ENCOUNTER — Emergency Department (HOSPITAL_COMMUNITY): Payer: Medicare PPO

## 2011-11-09 ENCOUNTER — Emergency Department (HOSPITAL_COMMUNITY)
Admission: EM | Admit: 2011-11-09 | Discharge: 2011-11-09 | Disposition: A | Discharge status: Home / Self Care | Payer: Medicare PPO | Attending: Emergency Medicine | Admitting: Emergency Medicine

## 2011-11-09 ENCOUNTER — Encounter (HOSPITAL_COMMUNITY): Payer: Self-pay | Admitting: Emergency Medicine

## 2011-11-09 DIAGNOSIS — I10 Essential (primary) hypertension: Secondary | ICD-10-CM | POA: Insufficient documentation

## 2011-11-09 DIAGNOSIS — M5430 Sciatica, unspecified side: Secondary | ICD-10-CM | POA: Insufficient documentation

## 2011-11-09 DIAGNOSIS — M549 Dorsalgia, unspecified: Secondary | ICD-10-CM

## 2011-11-09 HISTORY — DX: Essential (primary) hypertension: I10

## 2011-11-09 MED ORDER — HYDROMORPHONE HCL PF 1 MG/ML IJ SOLN
1.0000 mg | Freq: Once | INTRAMUSCULAR | Status: AC
  Administered 2011-11-09: 1 mg via INTRAMUSCULAR
  Filled 2011-11-09: qty 1

## 2011-11-09 MED ORDER — OXYCODONE-ACETAMINOPHEN 5-325 MG PO TABS: 1.0000 | ORAL_TABLET | Freq: Four times a day (QID) | ORAL | Status: AC | PRN

## 2011-11-09 MED ORDER — PREDNISONE 20 MG PO TABS: ORAL_TABLET | ORAL | Status: DC

## 2011-11-09 NOTE — ED Notes (Signed)
Patient complaining of sudden onset of back pain starting this morning. Also complaining of tingling down left leg. Denies injury. Denies chronic back pain.

## 2011-11-09 NOTE — ED Provider Notes (Signed)
History   This chart was scribed for Scott Lennert, MD by Sofie Rower. The patient was seen in room APA01/APA01 and the patient's care was started at 9:46PM.    CSN: [id number]  Arrival date & time 11/09/11  2021   First MD Initiated Contact with Patient 11/09/11 2138      Chief Complaint  Patient presents with  . Back Pain    (Consider location/radiation/quality/duration/timing/severity/associated sxs/prior treatment) Patient is a 51 y.o. male presenting with back pain. The history is provided by the patient. No language interpreter was used.  Back Pain  This is a new problem. The current episode started 6 to 12 hours ago. The problem occurs constantly. The problem has not changed since onset.The pain is associated with no known injury. The quality of the pain is described as shooting. The pain radiates to the left knee. The pain is moderate. Associated symptoms include tingling. Pertinent negatives include no chest pain, no fever, no headaches and no abdominal pain.    Past Medical History  Diagnosis Date  . Hypertension     Past Surgical History  Procedure Date  . Back surgery     History reviewed. No pertinent family history.  History  Substance Use Topics  . Smoking status: Never Smoker   . Smokeless tobacco: Not on file  . Alcohol Use: Yes     occasionally      Review of Systems  Constitutional: Negative for fever and fatigue.  HENT: Negative for congestion, sinus pressure and ear discharge.   Eyes: Negative for discharge.  Respiratory: Negative for cough.   Cardiovascular: Negative for chest pain.  Gastrointestinal: Negative for abdominal pain and diarrhea.  Genitourinary: Negative for frequency and hematuria.  Musculoskeletal: Positive for back pain.  Skin: Negative for rash.  Neurological: Positive for tingling. Negative for seizures and headaches.  Hematological: Negative.   Psychiatric/Behavioral: Negative for hallucinations.  All other systems  reviewed and are negative.    Allergies  Review of patient's allergies indicates no known allergies.  Home Medications  No current outpatient prescriptions on file.  BP 169/82  Pulse 60  Temp(Src) 97.8 F (36.6 C) (Oral)  Resp 16  Ht 5\' 7"  (1.702 m)  Wt 260 lb (117.935 kg)  BMI 40.72 kg/m2  SpO2 100%  Physical Exam  Constitutional: He is oriented to person, place, and time. He appears well-developed.  HENT:  Head: Normocephalic and atraumatic.  Eyes: Conjunctivae and EOM are normal. No scleral icterus.  Neck: Neck supple. No thyromegaly present.  Cardiovascular: Normal rate and regular rhythm.  Exam reveals no gallop and no friction rub.   No murmur heard. Pulmonary/Chest: No stridor. He has no wheezes. He has no rales. He exhibits no tenderness.  Abdominal: He exhibits no distension. There is no tenderness. There is no rebound.  Musculoskeletal: He exhibits tenderness (tenderness in lumbar spine.). He exhibits no edema.       Pt cannot move his left arm.  Lymphadenopathy:    He has no cervical adenopathy.  Neurological: He is oriented to person, place, and time. Coordination normal.  Skin: No rash noted. No erythema.  Psychiatric: He has a normal mood and affect. His behavior is normal.    ED Course  Procedures (including critical care time)  DIAGNOSTIC STUDIES: Oxygen Saturation is 100% on room air, normal by my interpretation.    COORDINATION OF CARE:    Results for orders placed during the hospital encounter of 03/30/11  RAPID STREP SCREEN  Component Value Range   Streptococcus, Group A Screen (Direct) NEGATIVE  NEGATIVE    Dg Lumbar Spine Complete  11/09/2011  *RADIOLOGY REPORT*  Clinical Data: Mid lower back pain greater on left  LUMBAR SPINE - COMPLETE 4+ VIEW  Comparison: 11/01/2008  Findings: Five non-rib bearing lumbar vertebrae. Question mild osseous demineralization. Vertebral body heights maintained. Disc space narrowing L4-L5. No acute  fracture, subluxation or bone destruction. No definite spondylolysis. SI joints symmetric.  IMPRESSION: Degenerative disc disease changes L4-L5. No acute bony abnormalities.  Original Report Authenticated By: Lollie Marrow, M.D.         MDM  Back pain sciatica..  Pt should follow up in 3 daya    9:49PM- EDP at bedside discusses treatment plan.   The chart was scribed for me under my direct supervision.  I personally performed the history, physical, and medical decision making and all procedures in the evaluation of this patient.Scott Lennert, MD 11/09/11 2312

## 2012-12-04 ENCOUNTER — Emergency Department (HOSPITAL_COMMUNITY)
Admission: EM | Admit: 2012-12-04 | Discharge: 2012-12-04 | Disposition: A | Discharge status: Home / Self Care | Payer: Medicare Other | Attending: Emergency Medicine | Admitting: Emergency Medicine

## 2012-12-04 ENCOUNTER — Encounter (HOSPITAL_COMMUNITY): Payer: Self-pay

## 2012-12-04 DIAGNOSIS — M5430 Sciatica, unspecified side: Secondary | ICD-10-CM | POA: Insufficient documentation

## 2012-12-04 DIAGNOSIS — M79609 Pain in unspecified limb: Secondary | ICD-10-CM | POA: Insufficient documentation

## 2012-12-04 DIAGNOSIS — I10 Essential (primary) hypertension: Secondary | ICD-10-CM | POA: Insufficient documentation

## 2012-12-04 DIAGNOSIS — Z9889 Other specified postprocedural states: Secondary | ICD-10-CM | POA: Insufficient documentation

## 2012-12-04 DIAGNOSIS — M549 Dorsalgia, unspecified: Secondary | ICD-10-CM

## 2012-12-04 MED ORDER — IBUPROFEN 800 MG PO TABS: 800.0000 mg | ORAL_TABLET | Freq: Four times a day (QID) | ORAL | Status: DC | PRN

## 2012-12-04 MED ORDER — HYDROCODONE-ACETAMINOPHEN 5-325 MG PO TABS: 1.0000 | ORAL_TABLET | Freq: Four times a day (QID) | ORAL | Status: DC | PRN

## 2012-12-04 MED ORDER — HYDROCODONE-ACETAMINOPHEN 5-325 MG PO TABS
2.0000 | ORAL_TABLET | Freq: Once | ORAL | Status: AC
  Administered 2012-12-04: 2 via ORAL
  Filled 2012-12-04: qty 2

## 2012-12-04 MED ORDER — IBUPROFEN 800 MG PO TABS
800.0000 mg | ORAL_TABLET | Freq: Once | ORAL | Status: AC
  Administered 2012-12-04: 800 mg via ORAL
  Filled 2012-12-04: qty 1

## 2012-12-04 MED ORDER — DIAZEPAM 5 MG PO TABS: 5.0000 mg | ORAL_TABLET | Freq: Two times a day (BID) | ORAL | Status: DC | PRN

## 2012-12-04 MED ORDER — DIAZEPAM 5 MG PO TABS
5.0000 mg | ORAL_TABLET | Freq: Once | ORAL | Status: AC
  Administered 2012-12-04: 5 mg via ORAL
  Filled 2012-12-04: qty 1

## 2012-12-04 NOTE — ED Notes (Signed)
Pain in lower back shooting into left leg per pt. Hurts me to lay down, sit up, stand, walk per pt.

## 2012-12-04 NOTE — ED Provider Notes (Signed)
History     CSN: [id number]  Arrival date & time 12/04/12  0000   First MD Initiated Contact with Patient 12/04/12 0028      Chief Complaint  Patient presents with  . Back Pain  . Leg Pain    (Consider location/radiation/quality/duration/timing/severity/associated sxs/prior treatment) HPI Scott Ritter is a 52 y.o. male with a history of sciatica who presents with back pain in the lower lumbar region it is severe, 10/10, sharp and shooting and radiates in an electric fashion down the left leg. He denies any numbness or tingling at rest, weakness of the left leg, saddle anesthesia, urinary retention, urinary incontinence or fecal incontinence. He's had similar symptoms before when his sciatica is acting up. He says the other day he twisted wrong while moving a chair and may have injured it then. He says he occasionally gets some tingling when he is walking on the left leg. It hurts worse when walking and hurt worse when sitting for prolonged times, helps when he is laying down with a pillow underneath his legs. Patient has not taken any analgesics within the last 12 hours. Patient denies any recent weight loss, any other antecedent trauma, no fevers or chills. Patient is had no abdominal pain, no chest pain, no dizziness, no shortness of breath, no nausea or vomiting.   Past Medical History  Diagnosis Date  . Hypertension     Past Surgical History  Procedure Laterality Date  . Back surgery      No family history on file.  History  Substance Use Topics  . Smoking status: Never Smoker   . Smokeless tobacco: Not on file  . Alcohol Use: Yes     Comment: occasionally      Review of Systems At least 10pt or greater review of systems completed and are negative except where specified in the HPI.  Allergies  Review of patient's allergies indicates no known allergies.  Home Medications  No current outpatient prescriptions on file.  BP 130/53  Pulse 70  Temp(Src) 98.3 F  (36.8 C) (Oral)  Resp 20  Ht 5\' 7"  (1.702 m)  Wt 208 lb (94.348 kg)  BMI 32.57 kg/m2  SpO2 96%  Physical Exam  Nursing notes reviewed.  Electronic medical record reviewed. VITAL SIGNS:   Filed Vitals:   12/04/12 0017  BP: 130/53  Pulse: 70  Temp: 98.3 F (36.8 C)  TempSrc: Oral  Resp: 20  Height: 5\' 7"  (1.702 m)  Weight: 208 lb (94.348 kg)  SpO2: 96%   CONSTITUTIONAL: Awake, oriented, appears non-toxic HENT: Atraumatic, normocephalic, oral mucosa pink and moist, airway patent. Nares patent without drainage. External ears normal. EYES: Conjunctiva clear, EOMI, PERRLA NECK: Trachea midline, non-tender, supple CARDIOVASCULAR: Normal heart rate, Normal rhythm, No murmurs, rubs, gallops PULMONARY/CHEST: Clear to auscultation, no rhonchi, wheezes, or rales. Symmetrical breath sounds. Non-tender. ABDOMINAL: Non-distended, soft, non-tender - no rebound or guarding.  BS normal. Back: Tender to palpation in the left lower lumbar paraspinous region, there is a palpable muscle spasm in the L2-L4 region.  Negative straight leg test. NEUROLOGIC: Non-focal, moving all four extremities, no gross sensory or motor deficits. EXTREMITIES: No clubbing, cyanosis, or edema SKIN: Warm, Dry, No erythema, No rash  ED Course  Procedures (including critical care time)  Labs Reviewed - No data to display No results found.   1. Back pain   2. Sciatica       MDM  Scott Ritter is a 52 y.o. male presenting with sciatic  symptoms. Patient does have a negative straight leg test however his symptoms are consistent with his prior sciatica, he is had prior disc decompression, and on prior x-ray is had degenerative disc disease at L4 and L5. Patient has no symptoms which are consistent with cord compression syndrome or cauda equina syndrome. There is no suggestion or concern for epidural abscess.  We'll treat the patient's pain, and given a prescription for analgesics as well as a short prescription  for Valium to control muscle spasm.  Patient has no other symptoms I do not think he's got any intrapelvic or neurologic emergency at this time.  I explained the diagnosis and have given explicit precautions to return to the ER including weakness, loss of bowel or bladder functions, urinary retention, fevers chills or any other new or worsening symptoms. The patient understands and accepts the medical plan as it's been dictated and I have answered their questions. Discharge instructions concerning home care and prescriptions have been given.  The patient is STABLE and is discharged to home in good condition.          Jones Skene, MD 12/04/12 [phone]

## 2013-07-13 DIAGNOSIS — R209 Unspecified disturbances of skin sensation: Secondary | ICD-10-CM | POA: Insufficient documentation

## 2013-07-13 DIAGNOSIS — M545 Low back pain, unspecified: Secondary | ICD-10-CM | POA: Insufficient documentation

## 2013-07-13 DIAGNOSIS — I10 Essential (primary) hypertension: Secondary | ICD-10-CM | POA: Insufficient documentation

## 2013-07-14 ENCOUNTER — Emergency Department (HOSPITAL_COMMUNITY)
Admission: EM | Admit: 2013-07-14 | Discharge: 2013-07-14 | Disposition: A | Discharge status: Home / Self Care | Payer: Medicare Other | Attending: Emergency Medicine | Admitting: Emergency Medicine

## 2013-07-14 ENCOUNTER — Encounter (HOSPITAL_COMMUNITY): Payer: Self-pay

## 2013-07-14 ENCOUNTER — Emergency Department (HOSPITAL_COMMUNITY): Payer: Medicare Other

## 2013-07-14 DIAGNOSIS — M549 Dorsalgia, unspecified: Secondary | ICD-10-CM

## 2013-07-14 MED ORDER — HYDROMORPHONE HCL PF 1 MG/ML IJ SOLN
1.0000 mg | Freq: Once | INTRAMUSCULAR | Status: AC
  Administered 2013-07-14: 1 mg via INTRAMUSCULAR
  Filled 2013-07-14: qty 1

## 2013-07-14 MED ORDER — HYDROCODONE-ACETAMINOPHEN 5-325 MG PO TABS: 1.0000 | ORAL_TABLET | Freq: Four times a day (QID) | ORAL | Status: DC | PRN

## 2013-07-14 MED ORDER — METHYLPREDNISOLONE SODIUM SUCC 125 MG IJ SOLR
125.0000 mg | Freq: Once | INTRAMUSCULAR | Status: AC
  Administered 2013-07-14: 125 mg via INTRAMUSCULAR
  Filled 2013-07-14: qty 2

## 2013-07-14 MED ORDER — PREDNISONE 10 MG PO TABS: 20.0000 mg | ORAL_TABLET | Freq: Every day | ORAL | Status: DC

## 2013-07-14 NOTE — ED Provider Notes (Signed)
CSN: [id number]     Arrival date & time 07/13/13  2349 History   First MD Initiated Contact with Patient 07/14/13 0029     Chief Complaint  Patient presents with  . Back Pain  . Leg numbness    (Consider location/radiation/quality/duration/timing/severity/associated sxs/prior Treatment) Patient is a 52 y.o. male presenting with back pain. The history is provided by the patient (the pt complains of back pain with radiation down his left leg).  Back Pain Location:  Lumbar spine Quality:  Aching Radiates to:  L posterior upper leg Pain severity:  Moderate Pain is:  Same all the time Onset quality:  Gradual Timing:  Constant Associated symptoms: no abdominal pain, no chest pain and no headaches     Past Medical History  Diagnosis Date  . Hypertension    Past Surgical History  Procedure Laterality Date  . Back surgery     No family history on file. History  Substance Use Topics  . Smoking status: Never Smoker   . Smokeless tobacco: Not on file  . Alcohol Use: Yes     Comment: occasionally    Review of Systems  Constitutional: Negative for appetite change and fatigue.  HENT: Negative for congestion, sinus pressure and ear discharge.   Eyes: Negative for discharge.  Respiratory: Negative for cough.   Cardiovascular: Negative for chest pain.  Gastrointestinal: Negative for abdominal pain and diarrhea.  Genitourinary: Negative for frequency and hematuria.  Musculoskeletal: Positive for back pain.  Skin: Negative for rash.  Neurological: Negative for seizures and headaches.  Psychiatric/Behavioral: Negative for hallucinations.    Allergies  Review of patient's allergies indicates no known allergies.  Home Medications   Current Outpatient Rx  Name  Route  Sig  Dispense  Refill  . diazepam (VALIUM) 5 MG tablet   Oral   Take 1 tablet (5 mg total) by mouth every 12 (twelve) hours as needed (severe back pain, muscle spasm).   11 tablet   0   .  HYDROcodone-acetaminophen (NORCO/VICODIN) 5-325 MG per tablet   Oral   Take 1-2 tablets by mouth every 6 (six) hours as needed for pain.   17 tablet   0   . HYDROcodone-acetaminophen (NORCO/VICODIN) 5-325 MG per tablet   Oral   Take 1 tablet by mouth every 6 (six) hours as needed for pain.   20 tablet   0   . ibuprofen (ADVIL,MOTRIN) 800 MG tablet   Oral   Take 1 tablet (800 mg total) by mouth every 6 (six) hours as needed for pain.   20 tablet   0   . predniSONE (DELTASONE) 10 MG tablet   Oral   Take 2 tablets (20 mg total) by mouth daily.   15 tablet   0    BP 136/81  Pulse 83  Temp(Src) 98.9 F (37.2 C) (Oral)  Resp 18  SpO2 98% Physical Exam  Constitutional: He is oriented to person, place, and time. He appears well-developed.  HENT:  Head: Normocephalic.  Eyes: Conjunctivae and EOM are normal. No scleral icterus.  Neck: Neck supple. No thyromegaly present.  Cardiovascular: Normal rate and regular rhythm.  Exam reveals no gallop and no friction rub.   No murmur heard. Pulmonary/Chest: No stridor. He has no wheezes. He has no rales. He exhibits no tenderness.  Abdominal: He exhibits no distension. There is no tenderness. There is no rebound.  Musculoskeletal: Normal range of motion. He exhibits no edema.  Lymphadenopathy:    He has no cervical  adenopathy.  Neurological: He is oriented to person, place, and time. He displays normal reflexes. He exhibits normal muscle tone. Coordination normal.  Post. Straight leg raise left  Skin: No rash noted. No erythema.  Psychiatric: He has a normal mood and affect. His behavior is normal.    ED Course  Procedures (including critical care time) Labs Review Labs Reviewed - No data to display Imaging Review Dg Lumbar Spine Complete  07/14/2013   CLINICAL DATA:  Left back pain, leg pain.  EXAM: LUMBAR SPINE - COMPLETE 4+ VIEW  COMPARISON:  11/09/2011  FINDINGS: Slight disk space narrowing in the lower lumbar spine at L4-5  and L5-S1. Normal alignment. No fracture. SI joints are symmetric and unremarkable. Mild degenerative changes in the hips bilaterally.  IMPRESSION: No acute bony abnormality.   Electronically Signed   By: Charlett Nose M.D.   On: 07/14/2013 01:34    MDM   1. Back pain       Benny Lennert, MD 07/14/13 [phone]

## 2013-07-14 NOTE — ED Notes (Signed)
Pt denies injury, states his lower back is bothering him more than usual today. Pt also c/o numbness to lower legs.

## 2014-03-22 ENCOUNTER — Encounter (HOSPITAL_COMMUNITY): Payer: Self-pay | Admitting: Emergency Medicine

## 2014-03-22 ENCOUNTER — Emergency Department (HOSPITAL_COMMUNITY)
Admission: EM | Admit: 2014-03-22 | Discharge: 2014-03-22 | Disposition: A | Discharge status: Home / Self Care | Payer: Medicare HMO | Attending: Emergency Medicine | Admitting: Emergency Medicine

## 2014-03-22 DIAGNOSIS — I10 Essential (primary) hypertension: Secondary | ICD-10-CM | POA: Insufficient documentation

## 2014-03-22 DIAGNOSIS — J329 Chronic sinusitis, unspecified: Secondary | ICD-10-CM

## 2014-03-22 DIAGNOSIS — IMO0002 Reserved for concepts with insufficient information to code with codable children: Secondary | ICD-10-CM | POA: Insufficient documentation

## 2014-03-22 DIAGNOSIS — Z8673 Personal history of transient ischemic attack (TIA), and cerebral infarction without residual deficits: Secondary | ICD-10-CM | POA: Insufficient documentation

## 2014-03-22 HISTORY — DX: Cerebral infarction, unspecified: I63.9

## 2014-03-22 MED ORDER — FLUTICASONE PROPIONATE 50 MCG/ACT NA SUSP: 2.0000 | Freq: Every day | NASAL | Status: DC

## 2014-03-22 NOTE — ED Provider Notes (Signed)
CSN: [id number]     Arrival date & time 03/22/14  2216 History   First MD Initiated Contact with Patient 03/22/14 2230     Chief Complaint  Patient presents with  . Facial Pain     The history is provided by the patient.  pt reports he has had symptoms of a sinus infection for past 2 days.  He reports congestion, cough and difficulty breathing though his nose.  No fever. No vomiting His course is worsening, and OTC meds are not helping his symptoms  Past Medical History  Diagnosis Date  . Hypertension   . Stroke    Past Surgical History  Procedure Laterality Date  . Back surgery     History reviewed. No pertinent family history. History  Substance Use Topics  . Smoking status: Never Smoker   . Smokeless tobacco: Not on file  . Alcohol Use: Yes     Comment: occasionally    Review of Systems  Constitutional: Negative for fever.  Respiratory: Positive for cough.       Allergies  Review of patient's allergies indicates no known allergies.  Home Medications   Prior to Admission medications   Medication Sig Start Date End Date Taking? Authorizing Provider  fluticasone (FLONASE) 50 MCG/ACT nasal spray Place 2 sprays into both nostrils daily. 03/22/14   Joya Gaskins, MD   BP 161/86  Pulse 65  Temp(Src) 98 F (36.7 C) (Oral)  Resp 20  Ht 5\' 7"  (1.702 m)  Wt 215 lb (97.523 kg)  BMI 33.67 kg/m2  SpO2 99% Physical Exam CONSTITUTIONAL: Well developed/well nourished HEAD: Normocephalic/atraumatic EYES: EOMI/PERRL ENMT: Mucous membranes moist, nasal congestion noted.   NECK: supple no meningeal signs CV: S1/S2 noted, no murmurs/rubs/gallops noted LUNGS: Lungs are clear to auscultation bilaterally, no apparent distress ABDOMEN: soft, nontender, no rebound or guarding NEURO: Pt is awake/alert, moves all extremitiesx4 EXTREMITIES: pulses normal, full ROM SKIN: warm, color normal PSYCH: no abnormalities of mood noted  ED Course  Procedures   MDM   Final  diagnoses:  Sinusitis    Nursing notes including past medical history and social history reviewed and considered in documentation     Joya Gaskins, MD 03/22/14 2244

## 2014-03-22 NOTE — ED Notes (Signed)
Discharge instructions and prescription given and reviewed with patient. Patient verbalized understanding to follow up with PMD as needed.  Patient discharged home in good condition via wheelchair.

## 2014-03-22 NOTE — Discharge Instructions (Signed)

## 2014-03-22 NOTE — ED Notes (Signed)
Pt with facial pressure and congestion since Monday, productive cough, denies fever

## 2014-09-22 ENCOUNTER — Ambulatory Visit: Payer: Self-pay | Admitting: Podiatrist

## 2014-10-20 ENCOUNTER — Ambulatory Visit: Payer: Self-pay | Admitting: Podiatrist

## 2015-05-02 DIAGNOSIS — N529 Male erectile dysfunction, unspecified: Secondary | ICD-10-CM | POA: Diagnosis not present

## 2015-05-02 DIAGNOSIS — E118 Type 2 diabetes mellitus with unspecified complications: Secondary | ICD-10-CM | POA: Diagnosis not present

## 2015-05-02 DIAGNOSIS — E785 Hyperlipidemia, unspecified: Secondary | ICD-10-CM | POA: Diagnosis not present

## 2015-05-02 DIAGNOSIS — G8114 Spastic hemiplegia affecting left nondominant side: Secondary | ICD-10-CM | POA: Diagnosis not present

## 2015-05-12 ENCOUNTER — Emergency Department (HOSPITAL_COMMUNITY)
Admission: EM | Admit: 2015-05-12 | Discharge: 2015-05-12 | Disposition: A | Discharge status: Home / Self Care | Payer: Commercial Managed Care - HMO | Attending: Emergency Medicine | Admitting: Emergency Medicine

## 2015-05-12 ENCOUNTER — Encounter (HOSPITAL_COMMUNITY): Payer: Self-pay | Admitting: *Deleted

## 2015-05-12 DIAGNOSIS — Y998 Other external cause status: Secondary | ICD-10-CM | POA: Diagnosis not present

## 2015-05-12 DIAGNOSIS — X58XXXA Exposure to other specified factors, initial encounter: Secondary | ICD-10-CM | POA: Diagnosis not present

## 2015-05-12 DIAGNOSIS — I10 Essential (primary) hypertension: Secondary | ICD-10-CM | POA: Insufficient documentation

## 2015-05-12 DIAGNOSIS — Z8673 Personal history of transient ischemic attack (TIA), and cerebral infarction without residual deficits: Secondary | ICD-10-CM | POA: Diagnosis not present

## 2015-05-12 DIAGNOSIS — S3992XA Unspecified injury of lower back, initial encounter: Secondary | ICD-10-CM | POA: Insufficient documentation

## 2015-05-12 DIAGNOSIS — M549 Dorsalgia, unspecified: Secondary | ICD-10-CM

## 2015-05-12 DIAGNOSIS — Z9889 Other specified postprocedural states: Secondary | ICD-10-CM | POA: Diagnosis not present

## 2015-05-12 DIAGNOSIS — Z7951 Long term (current) use of inhaled steroids: Secondary | ICD-10-CM | POA: Diagnosis not present

## 2015-05-12 DIAGNOSIS — Y9289 Other specified places as the place of occurrence of the external cause: Secondary | ICD-10-CM | POA: Insufficient documentation

## 2015-05-12 DIAGNOSIS — M545 Low back pain: Secondary | ICD-10-CM | POA: Diagnosis not present

## 2015-05-12 DIAGNOSIS — Y9389 Activity, other specified: Secondary | ICD-10-CM | POA: Diagnosis not present

## 2015-05-12 MED ORDER — PREDNISONE 20 MG PO TABS: 40.0000 mg | ORAL_TABLET | Freq: Every day | ORAL | Status: DC

## 2015-05-12 MED ORDER — METHOCARBAMOL 500 MG PO TABS: 500.0000 mg | ORAL_TABLET | Freq: Two times a day (BID) | ORAL | Status: DC

## 2015-05-12 NOTE — ED Notes (Signed)
Pt reporting pain in lower back radiating into legs.  Reports pain began about 2 days ago.  Denies specific injury.

## 2015-05-12 NOTE — Discharge Instructions (Signed)
Take the prescribed medication as directed. °Follow-up with your primary care physician. °Return to the ED for new or worsening symptoms. ° °

## 2015-05-12 NOTE — ED Provider Notes (Signed)
CSN: [id number]     Arrival date & time 05/12/15  2046 History   First MD Initiated Contact with Patient 05/12/15 2059     Chief Complaint  Patient presents with  . Back Pain     (Consider location/radiation/quality/duration/timing/severity/associated sxs/prior Treatment) The history is provided by the patient and medical records.    54 y.o. M with hx of HTN and stroke, presenting to the ED for low back pain x 2 days.  Patient states on Thursday evening he lifted a heavy laundry basket and thinks he "tweaked" his back.  He state since this time he has been having persisten tpain in his left lower back with some radiation into the left posterior thigh but does not descend past the knee. He denies numbness or weakness of his left leg. No loss of bowel or bladder control. Patient does have history of herniated disc in the past which didn't require surgery. He has not followed up with his neurosurgeon in the past several years. He took aspirin prior to arrival, mild relief.  Past Medical History  Diagnosis Date  . Hypertension   . Stroke    Past Surgical History  Procedure Laterality Date  . Back surgery     No family history on file. History  Substance Use Topics  . Smoking status: Never Smoker   . Smokeless tobacco: Not on file  . Alcohol Use: Yes     Comment: occasionally    Review of Systems  Musculoskeletal: Positive for back pain.  All other systems reviewed and are negative.     Allergies  Review of patient's allergies indicates no known allergies.  Home Medications   Prior to Admission medications   Medication Sig Start Date End Date Taking? Authorizing Provider  fluticasone (FLONASE) 50 MCG/ACT nasal spray Place 2 sprays into both nostrils daily. 03/22/14   Zadie Rhine, MD   BP 196/88 mmHg  Pulse 77  Temp(Src) 98.8 F (37.1 C) (Oral)  Resp 20  Ht 5' 6.5" (1.689 m)  Wt 212 lb (96.163 kg)  BMI 33.71 kg/m2  SpO2 95%   Physical Exam  Constitutional: He  is oriented to person, place, and time. He appears well-developed and well-nourished.  NAD, texting on cell phone throughout entire exam  HENT:  Head: Normocephalic and atraumatic.  Mouth/Throat: Oropharynx is clear and moist.  Eyes: Conjunctivae and EOM are normal. Pupils are equal, round, and reactive to light.  Neck: Normal range of motion.  Cardiovascular: Normal rate, regular rhythm and normal heart sounds.   Pulmonary/Chest: Effort normal and breath sounds normal. No respiratory distress. He has no wheezes.  Musculoskeletal: Normal range of motion.  Well-healed midline lumbar surgical incision Tenderness of left SI joint without acute deformity; positive straight leg raise on left; normal strength and sensation of bilateral lower extremities; normal gait  Neurological: He is alert and oriented to person, place, and time.  Skin: Skin is warm and dry.  Psychiatric: He has a normal mood and affect.  Nursing note and vitals reviewed.   ED Course  Procedures (including critical care time) Labs Review Labs Reviewed - No data to display  Imaging Review No results found.   EKG Interpretation None      MDM   Final diagnoses:  Back pain, unspecified location   54 year old male here with left lower back pain for the past 2 days after lifting a laundry basket. He has tenderness of his left SI joint with positive straight leg raise on the left. He  has no focal neurologic deficits to suggest cauda equina. Vital signs are stable. Suspect lumbar strain +/- sciatic nerve irritation.  Patient d/c home with robaxin and prednisone.  Encouraged to FU with PCP.  Discussed plan with patient, he/she acknowledged understanding and agreed with plan of care.  Return precautions given for new or worsening symptoms.  Garlon Hatchet, PA-C 05/12/15 2121  Mancel Bale, MD 05/12/15 [phone]

## 2015-08-05 ENCOUNTER — Encounter (HOSPITAL_COMMUNITY): Payer: Self-pay | Admitting: Emergency Medicine

## 2015-08-05 ENCOUNTER — Emergency Department (HOSPITAL_COMMUNITY)
Admission: EM | Admit: 2015-08-05 | Discharge: 2015-08-05 | Disposition: A | Discharge status: Home / Self Care | Payer: Commercial Managed Care - HMO | Attending: Emergency Medicine | Admitting: Emergency Medicine

## 2015-08-05 DIAGNOSIS — Z8673 Personal history of transient ischemic attack (TIA), and cerebral infarction without residual deficits: Secondary | ICD-10-CM | POA: Insufficient documentation

## 2015-08-05 DIAGNOSIS — Z7982 Long term (current) use of aspirin: Secondary | ICD-10-CM | POA: Diagnosis not present

## 2015-08-05 DIAGNOSIS — Z7951 Long term (current) use of inhaled steroids: Secondary | ICD-10-CM | POA: Diagnosis not present

## 2015-08-05 DIAGNOSIS — H109 Unspecified conjunctivitis: Secondary | ICD-10-CM | POA: Diagnosis not present

## 2015-08-05 DIAGNOSIS — I10 Essential (primary) hypertension: Secondary | ICD-10-CM | POA: Insufficient documentation

## 2015-08-05 DIAGNOSIS — H578 Other specified disorders of eye and adnexa: Secondary | ICD-10-CM | POA: Diagnosis present

## 2015-08-05 DIAGNOSIS — Z79899 Other long term (current) drug therapy: Secondary | ICD-10-CM | POA: Diagnosis not present

## 2015-08-05 MED ORDER — TETRACAINE HCL 0.5 % OP SOLN
OPHTHALMIC | Status: AC
  Administered 2015-08-05: 22:00:00
  Filled 2015-08-05: qty 2

## 2015-08-05 MED ORDER — CIPROFLOXACIN HCL 0.3 % OP SOLN
OPHTHALMIC | Status: AC
  Filled 2015-08-05: qty 2.5

## 2015-08-05 MED ORDER — FLUORESCEIN SODIUM 1 MG OP STRP
ORAL_STRIP | OPHTHALMIC | Status: AC
  Administered 2015-08-05: 1
  Filled 2015-08-05: qty 1

## 2015-08-05 MED ORDER — CIPROFLOXACIN HCL 0.3 % OP SOLN
2.0000 [drp] | OPHTHALMIC | Status: DC
  Administered 2015-08-05: 2 [drp] via OPHTHALMIC
  Filled 2015-08-05: qty 2.5

## 2015-08-05 NOTE — ED Provider Notes (Signed)
CSN: [id number]     Arrival date & time 08/05/15  2043 History   First MD Initiated Contact with Patient 08/05/15 2055     Chief Complaint  Patient presents with  . Eye Drainage     (Consider location/radiation/quality/duration/timing/severity/associated sxs/prior Treatment) HPI  Scott Ritter N Radler is a 54 y.o. male who presents with a three day history of clear, thin drainage from his left eye. No purulence noted by pt. Pt denies waking up with eye matted shut. Pt denies allergy symptoms such as rhinorrhea or sneezing. Pt also states, "Sometimes it feels like there is something in my eye, but then it goes away." Pt denies any vision changes, accompanying pain or illness, or any additional complaints.   Past Medical History  Diagnosis Date  . Hypertension   . Stroke [facility](HCC)    Past Surgical History  Procedure Laterality Date  . Back surgery     History reviewed. No pertinent family history. Social History  Substance Use Topics  . Smoking status: Never Smoker   . Smokeless tobacco: None  . Alcohol Use: Yes     Comment: occasionally    Review of Systems  Constitutional: Negative for fever and chills.  HENT: Negative for ear discharge, ear pain, rhinorrhea, sneezing and sore throat.        Eye drainage from left eye.  Skin: Negative for rash.  Neurological: Negative for headaches.  Hematological: Negative for adenopathy.      Allergies  Review of patient's allergies indicates no known allergies.  Home Medications   Prior to Admission medications   Medication Sig Start Date End Date Taking? Authorizing Provider  amLODipine (NORVASC) 5 MG tablet Take 5 mg by mouth daily. 05/04/15  Yes Historical Provider, MD  aspirin EC 81 MG tablet Take 81 mg by mouth daily.   Yes Historical Provider, MD  atorvastatin (LIPITOR) 20 MG tablet Take 20 mg by mouth daily. 05/04/15  Yes Historical Provider, MD  losartan-hydrochlorothiazide (HYZAAR) 100-25 MG per tablet Take 1 tablet by mouth  daily. 05/04/15  Yes Historical Provider, MD  fluticasone (FLONASE) 50 MCG/ACT nasal spray Place 2 sprays into both nostrils daily. 03/22/14   Zadie Rhineonald Wickline, MD  methocarbamol (ROBAXIN) 500 MG tablet Take 1 tablet (500 mg total) by mouth 2 (two) times daily. 05/12/15   Garlon HatchetLisa M Sanders, PA-C  predniSONE (DELTASONE) 20 MG tablet Take 2 tablets (40 mg total) by mouth daily. Take 40 mg by mouth daily for 3 days, then 20mg  by mouth daily for 3 days, then 10mg  daily for 3 days 05/12/15   Garlon HatchetLisa M Sanders, PA-C   BP 178/90 mmHg  Pulse 60  Temp(Src) 97.8 F (36.6 C) (Oral)  Resp 15  Ht 5\' 7"  (1.702 m)  Wt 215 lb (97.523 kg)  BMI 33.67 kg/m2  SpO2 98% Physical Exam  Constitutional: He appears well-developed and well-nourished.  HENT:  Head: Normocephalic and atraumatic.  Minimal, thin, non-purulent discharge present from left eye. No copious discharge. Sclera mildly injected. No swelling of lid. Woods and Slit lamp exams documented in MDM.  Eyes: Conjunctivae and EOM are normal. Pupils are equal, round, and reactive to light.  Nursing note and vitals reviewed.   ED Course  Procedures (including critical care time) Labs Review Labs Reviewed - No data to display  Imaging Review No results found. I have personally reviewed and evaluated these images and lab results as part of my medical decision-making.   EKG Interpretation None      MDM  Final diagnoses:  Conjunctivitis of left eye    Scott Bruce presents with thin, watery discharge from left eye for three days.  Fluorescein and tetracaine applied. Woods lamp revealed no foreign bodies. Slit lamp exam reveals no foreign bodies, corneal lesions, or any other abnormalities. Pt to be discharged with antibiotic drops for possible conjunctivitis.      Anselm Pancoast, PA-C 08/05/15 2157  Eber Hong, MD 08/07/15 [phone]

## 2015-08-05 NOTE — ED Notes (Signed)
Pt states that he has been having left eye irritation with drainage for 2 days

## 2015-08-05 NOTE — Discharge Instructions (Signed)
You are encouraged to use the antibiotics that you have been discharged with 1-2 drops in the left eye every four hours while awake. Follow up with PCP as needed. Return to ED should symptoms worsen.   Bacterial Conjunctivitis Bacterial conjunctivitis, commonly called pink eye, is an inflammation of the clear membrane that covers the white part of the eye (conjunctiva). The inflammation can also happen on the underside of the eyelids. The blood vessels in the conjunctiva become inflamed, causing the eye to become red or pink. Bacterial conjunctivitis may spread easily from one eye to another and from person to person (contagious).  CAUSES  Bacterial conjunctivitis is caused by bacteria. The bacteria may come from your own skin, your upper respiratory tract, or from someone else with bacterial conjunctivitis. SYMPTOMS  The normally white color of the eye or the underside of the eyelid is usually pink or red. The pink eye is usually associated with irritation, tearing, and some sensitivity to light. Bacterial conjunctivitis is often associated with a thick, yellowish discharge from the eye. The discharge may turn into a crust on the eyelids overnight, which causes your eyelids to stick together. If a discharge is present, there may also be some blurred vision in the affected eye. DIAGNOSIS  Bacterial conjunctivitis is diagnosed by your caregiver through an eye exam and the symptoms that you report. Your caregiver looks for changes in the surface tissues of your eyes, which may point to the specific type of conjunctivitis. A sample of any discharge may be collected on a cotton-tip swab if you have a severe case of conjunctivitis, if your cornea is affected, or if you keep getting repeat infections that do not respond to treatment. The sample will be sent to a lab to see if the inflammation is caused by a bacterial infection and to see if the infection will respond to antibiotic medicines. TREATMENT   1. Bacterial conjunctivitis is treated with antibiotics. Antibiotic eyedrops are most often used. However, antibiotic ointments are also available. Antibiotics pills are sometimes used. Artificial tears or eye washes may ease discomfort. HOME CARE INSTRUCTIONS  1. To ease discomfort, apply a cool, clean washcloth to your eye for 10-20 minutes, 3-4 times a day. 2. Gently wipe away any drainage from your eye with a warm, wet washcloth or a cotton ball. 3. Wash your hands often with soap and water. Use paper towels to dry your hands. 4. Do not share towels or washcloths. This may spread the infection. 5. Change or wash your pillowcase every day. 6. You should not use eye makeup until the infection is gone. 7. Do not operate machinery or drive if your vision is blurred. 8. Stop using contact lenses. Ask your caregiver how to sterilize or replace your contacts before using them again. This depends on the type of contact lenses that you use. 9. When applying medicine to the infected eye, do not touch the edge of your eyelid with the eyedrop bottle or ointment tube. SEEK IMMEDIATE MEDICAL CARE IF:   Your infection has not improved within 3 days after beginning treatment.  You had yellow discharge from your eye and it returns.  You have increased eye pain.  Your eye redness is spreading.  Your vision becomes blurred.  You have a fever or persistent symptoms for more than 2-3 days.  You have a fever and your symptoms suddenly get worse.  You have facial pain, redness, or swelling. MAKE SURE YOU:   Understand these instructions.  Will watch  your condition.  Will get help right away if you are not doing well or get worse.   This information is not intended to replace advice given to you by your health care provider. Make sure you discuss any questions you have with your health care provider.   Document Released: 09/29/2005 Document Revised: 10/20/2014 Document Reviewed:  03/01/2012 Elsevier Interactive Patient Education 2016 [employer].  How to Use Eye Drops and Eye Ointments HOW TO APPLY EYE DROPS Follow these steps when applying eye drops: 2. Wash your hands. 3. Tilt your head back. 4. Put a finger under your eye and use it to gently pull your lower lid downward. Keep that finger in place. 5. Using your other hand, hold the dropper between your thumb and index finger. 6. Position the dropper just over the edge of the lower lid. Hold it as close to your eye as you can without touching the dropper to your eye. 7. Steady your hand. One way to do this is to lean your index finger against your brow. 8. Look up. 9. Slowly and gently squeeze one drop of medicine into your eye. 10. Close your eye. 11. Place a finger between your lower eyelid and your nose. Press gently for 2 minutes. This increases the amount of time that the medicine is exposed to the eye. It also reduces side effects that can develop if the drop gets into the bloodstream through the nose. HOW TO APPLY EYE OINTMENTS Follow these steps when applying eye ointments: 10. Wash your hands. 11. Put a finger under your eye and use it to gently pull your lower lid downward. Keep that finger in place. 12. Using your other hand, place the tip of the tube between your thumb and index finger with the remaining fingers braced against your cheek or nose. 13. Hold the tube just over the edge of your lower lid without touching the tube to your lid or eyeball. 14. Look up. 15. Line the inner part of your lower lid with ointment. 16. Gently pull up on your upper lid and look down. This will force the ointment to spread over the surface of the eye. 17. Release the upper lid. 18. If you can, close your eyes for 1-2 minutes. Do not rub your eyes. If you applied the ointment correctly, your vision will be blurry for a few minutes. This is normal. ADDITIONAL INFORMATION  Make sure to use the eye drops or  ointment as told by your health care provider.  If you have been told to use both eye drops and an eye ointment, apply the eye drops first, then wait 3-4 minutes before you apply the ointment.  Try not to touch the tip of the dropper or tube to your eye. A dropper or tube that has touched the eye can become contaminated.   This information is not intended to replace advice given to you by your health care provider. Make sure you discuss any questions you have with your health care provider.   Document Released: 01/05/2001 Document Revised: 02/13/2015 Document Reviewed: 09/25/2014 Elsevier Interactive Patient Education [employer]

## 2015-08-05 NOTE — ED Provider Notes (Signed)
The patient presents with one day of left eye discomfort and some watery drainage. On exam he does have mild erythema of the left conjunctiva, small amount of increased watery drainage, on lid eversion there is no foreign body, normal pupils, normal extraocular movements, normal visual acuity, slit lamp exam with floor seen in tetracaine reveals no signs of corneal abrasion, corneal ulcer or foreign bodies. The patient appears stable for discharge on antibiotic for possible early conjunctivitis.  Medical screening examination/treatment/procedure(s) were conducted as a shared visit with non-physician practitioner(s) and myself.  I personally evaluated the patient during the encounter.  Clinical Impression:   Final diagnoses:  Conjunctivitis of left eye         Scott HongBrian Nester Bachus, MD 08/07/15 [phone]

## 2015-10-01 DIAGNOSIS — N529 Male erectile dysfunction, unspecified: Secondary | ICD-10-CM | POA: Diagnosis not present

## 2015-10-01 DIAGNOSIS — I10 Essential (primary) hypertension: Secondary | ICD-10-CM | POA: Diagnosis not present

## 2015-10-01 DIAGNOSIS — G8114 Spastic hemiplegia affecting left nondominant side: Secondary | ICD-10-CM | POA: Diagnosis not present

## 2015-10-01 DIAGNOSIS — I69354 Hemiplegia and hemiparesis following cerebral infarction affecting left non-dominant side: Secondary | ICD-10-CM | POA: Diagnosis not present

## 2015-10-01 DIAGNOSIS — M722 Plantar fascial fibromatosis: Secondary | ICD-10-CM | POA: Diagnosis not present

## 2015-10-01 DIAGNOSIS — Z125 Encounter for screening for malignant neoplasm of prostate: Secondary | ICD-10-CM | POA: Diagnosis not present

## 2015-10-01 DIAGNOSIS — E785 Hyperlipidemia, unspecified: Secondary | ICD-10-CM | POA: Diagnosis not present

## 2015-10-01 DIAGNOSIS — E118 Type 2 diabetes mellitus with unspecified complications: Secondary | ICD-10-CM | POA: Diagnosis not present

## 2015-10-09 ENCOUNTER — Telehealth: Payer: Self-pay

## 2015-10-09 NOTE — Telephone Encounter (Signed)
Pt received letter from [facility] to set up colonoscopy. Please call him back at [phone]

## 2015-10-09 NOTE — Telephone Encounter (Signed)
Records requested

## 2015-10-09 NOTE — Telephone Encounter (Signed)
I called pt and he said he had a [facility] about 5-6 years ago [facility]. He doesn't remember the results and recommendation. I told him we will get a copy of that and review and call him.  Sending a message to Darl PikesSusan to get that report.

## 2015-10-10 NOTE — Telephone Encounter (Signed)
Received a fax back from [facility] that pt has not had a procedure at that facility.

## 2015-10-11 NOTE — Telephone Encounter (Signed)
I called and informed pt. He said it had been so long ago he is not sure. I have triaged and scheduled for colonoscopy with Dr. Darrick PennaFields on 10/29/2015 at 12:45 PM.

## 2015-10-11 NOTE — Addendum Note (Signed)
Addended by: Lavena BullionSTEWART, Tacori Kvamme H on: 10/11/2015 11:03 AM   Modules accepted: Medications

## 2015-10-11 NOTE — Telephone Encounter (Signed)
Gastroenterology Pre-Procedure Review  Request Date: 10/11/2015 Requesting Physician: Dr. Parke SimmersBland  PATIENT REVIEW QUESTIONS: The patient responded to the following health history questions as indicated:    1. Diabetes Melitis: no 2. Joint replacements in the past 12 months: no 3. Major health problems in the past 3 months: no 4. Has an artificial valve or MVP: no 5. Has a defibrillator: no 6. Has been advised in past to take antibiotics in advance of a procedure like teeth cleaning: no 7. Family history of colon cancer: no  8. Alcohol Use: YES    Said he drinks a couple of beers on the weekends 9. History of sleep apnea: no     MEDICATIONS & ALLERGIES:    Patient reports the following regarding taking any blood thinners:   Plavix? no Aspirin? YES Coumadin? no  Patient confirms/reports the following medications:  Current Outpatient Prescriptions  Medication Sig Dispense Refill  . amLODipine (NORVASC) 5 MG tablet Take 5 mg by mouth daily.    Marland Kitchen. aspirin EC 81 MG tablet Take 81 mg by mouth daily.    Marland Kitchen. atorvastatin (LIPITOR) 20 MG tablet Take 20 mg by mouth daily.    Marland Kitchen. losartan-hydrochlorothiazide (HYZAAR) 100-25 MG per tablet Take 1 tablet by mouth daily.    . fluticasone (FLONASE) 50 MCG/ACT nasal spray Place 2 sprays into both nostrils daily. (Patient not taking: Reported on 10/11/2015) 16 g 0  . methocarbamol (ROBAXIN) 500 MG tablet Take 1 tablet (500 mg total) by mouth 2 (two) times daily. (Patient not taking: Reported on 10/11/2015) 20 tablet 0  . predniSONE (DELTASONE) 20 MG tablet Take 2 tablets (40 mg total) by mouth daily. Take 40 mg by mouth daily for 3 days, then 20mg  by mouth daily for 3 days, then 10mg  daily for 3 days (Patient not taking: Reported on 10/11/2015) 12 tablet 0   No current facility-administered medications for this visit.    Patient confirms/reports the following allergies:  No Known Allergies  No orders of the defined types were placed in this  encounter.    AUTHORIZATION INFORMATION Primary Insurance:   ID #:   Group #:  Pre-Cert / Auth required: Pre-Cert / Auth #:   Secondary Insurance:  ID #:   Group #:  Pre-Cert / Auth required:  Pre-Cert / Auth #:   SCHEDULE INFORMATION: Procedure has been scheduled as follows:  Date:  10/29/2015             Time:  12:45 PM Location: [facility] Short Stay  This Gastroenterology Pre-Precedure Review Form is being routed to the following provider(s): Jonette EvaSandi Fields, MD

## 2015-10-11 NOTE — Telephone Encounter (Signed)
SUPREP SPLIT DOSING- CLEAR LIQUIDS WITH BREAKFAST.  

## 2015-10-17 ENCOUNTER — Other Ambulatory Visit: Payer: Self-pay

## 2015-10-17 DIAGNOSIS — Z1211 Encounter for screening for malignant neoplasm of colon: Secondary | ICD-10-CM

## 2015-10-17 MED ORDER — NA SULFATE-K SULFATE-MG SULF 17.5-3.13-1.6 GM/177ML PO SOLN: 1.0000 | ORAL | Status: DC

## 2015-10-17 NOTE — Telephone Encounter (Signed)
Rx sent to the pharmacy and instructions mailed to pt.  

## 2015-10-17 NOTE — Addendum Note (Signed)
Addended by: Lavena BullionSTEWART, Zoeie Ritter H on: 10/17/2015 10:34 AM   Modules accepted: Orders

## 2015-10-23 ENCOUNTER — Telehealth: Payer: Self-pay

## 2015-10-23 NOTE — Telephone Encounter (Signed)
[employer] referral/approval for colonoscopy with Dr. Darrick PennaFields and Kennyth ArnoldStacy is scanning in epic.

## 2015-10-24 ENCOUNTER — Ambulatory Visit: Payer: Self-pay | Admitting: Podiatry

## 2015-10-26 ENCOUNTER — Telehealth: Payer: Self-pay

## 2015-10-26 NOTE — Telephone Encounter (Signed)
Pt called and requested to move his TCS to 11/26/2015.  I told pt I would hold spot on schedule for him.  He would have to speak with Tyler Aasoris whom triaged him to verify his triage before new instructions would be mailed to him.  [facility] and instructions

## 2015-11-08 NOTE — Telephone Encounter (Signed)
Tried to call pt. Will need to update his med list. He has been rescheduled to 11/26/2015 at 8:30 AM with Dr. Darrick Penna.

## 2015-11-08 NOTE — Telephone Encounter (Signed)
Tried to call, many rings and no answer.  

## 2015-11-09 NOTE — Telephone Encounter (Signed)
Mailing a letter to pt with his new instructions to call and give me update on his medications since he has been rescheduled.

## 2015-11-21 ENCOUNTER — Ambulatory Visit (INDEPENDENT_AMBULATORY_CARE_PROVIDER_SITE_OTHER): Payer: Commercial Managed Care - HMO | Admitting: Podiatry

## 2015-11-21 NOTE — Progress Notes (Signed)
Patient ID: Scott Ritter, male   DOB: 05-09-1961, 55 y.o.   MRN: [id number]   No show

## 2015-11-22 ENCOUNTER — Telehealth: Payer: Self-pay

## 2015-11-22 MED ORDER — PEG 3350-KCL-NA BICARB-NACL 420 G PO SOLR: 4000.0000 mL | ORAL | Status: DC

## 2015-11-22 NOTE — Telephone Encounter (Signed)
Pt called and could not afford Suprep. I sent in [place] and mailed new instructions.

## 2015-11-22 NOTE — Telephone Encounter (Signed)
REVIEWED-NO ADDITIONAL RECOMMENDATIONS. 

## 2015-11-26 ENCOUNTER — Telehealth: Payer: Self-pay

## 2015-11-26 NOTE — Telephone Encounter (Signed)
I got a VM from pt that he had to cancel the colonoscopy today. His wife's schedule changed and she could not bring him. He would like to reschedule. I tried to call, many rings and no answer. I called and told ENDO that he had to reschedule due to transportation problems.

## 2015-11-27 NOTE — Telephone Encounter (Signed)
PT left a VM that he will reschedule his colonoscopy. I tried to return his call @ [phone] and was told it was not his number.

## 2015-12-03 NOTE — Telephone Encounter (Signed)
PT came by the office and rescheduled for 12/28/2015 at 9:30 AM with Dr. Darrick Penna. Scott Ritter is aware in Endo.  I gave pt new instructions for the trilyte prep since he had asked for cheaper prep.

## 2015-12-03 NOTE — Telephone Encounter (Signed)
REVIEWED-NO ADDITIONAL RECOMMENDATIONS. 

## 2015-12-03 NOTE — Telephone Encounter (Signed)
Pt said there has been no change in his medications.

## 2015-12-27 ENCOUNTER — Telehealth: Payer: Self-pay

## 2015-12-27 ENCOUNTER — Telehealth: Payer: Self-pay | Admitting: Gastroenterology

## 2015-12-27 NOTE — Telephone Encounter (Signed)
PLEASE CALL PT. WE UNDERSTAND THINGS COME UP, BUT IF HE CANCELS OR RESCHEDULES HIS COLONOSCOPY  WITHIN 7 DAYS OF HIS APPT IN April, WE WILL NEED TO DISCHARGE HIM FROM THE PRACTICE.

## 2015-12-27 NOTE — Telephone Encounter (Signed)
PT said he has a bad cold and wants to reschedule the procedure. He has been rescheduled to 02/01/2016 at 1:15 PM with Dr. Darrick PennaFields and I am sending new instructions.

## 2015-12-27 NOTE — Telephone Encounter (Signed)
Pt wants to cancel procedure for tomorrow. Please call [phone] to reschedule

## 2015-12-27 NOTE — Telephone Encounter (Signed)
[employer] approved the screening colonoscopy on line # [phone].

## 2015-12-28 ENCOUNTER — Encounter (HOSPITAL_COMMUNITY): Admission: RE | Payer: Self-pay | Source: Ambulatory Visit

## 2015-12-28 SURGERY — COLONOSCOPY
Anesthesia: Moderate Sedation

## 2015-12-28 NOTE — Telephone Encounter (Signed)
I tried to call the [phone]) and she stated the patient no longer lives there and she doesn't know how to get in touch with him.

## 2016-01-28 ENCOUNTER — Telehealth: Payer: Self-pay

## 2016-01-28 NOTE — Telephone Encounter (Signed)
Tried to call pt to update meds prior to procedure. Line was busy.

## 2016-01-31 NOTE — Telephone Encounter (Signed)
Pt called office and LMOM states that he will need to cancel his TCS on 04/21 because something has come up and will call us back and reschedule

## 2016-02-01 ENCOUNTER — Encounter (HOSPITAL_COMMUNITY): Admission: RE | Payer: Self-pay | Source: Ambulatory Visit

## 2016-02-01 ENCOUNTER — Ambulatory Visit (HOSPITAL_COMMUNITY)
Admission: RE | Admit: 2016-02-01 | Payer: Commercial Managed Care - HMO | Source: Ambulatory Visit | Admitting: Gastroenterology

## 2016-02-01 SURGERY — COLONOSCOPY
Anesthesia: Moderate Sedation

## 2016-02-06 ENCOUNTER — Encounter: Payer: Self-pay | Admitting: General Practice

## 2016-02-06 NOTE — Telephone Encounter (Signed)
Discharged letter mailed

## 2016-02-06 NOTE — Telephone Encounter (Signed)
FYI to Dr. Fields.  

## 2016-02-06 NOTE — Telephone Encounter (Addendum)
REVIEWED-PT HAS CANCELLED OR RESCHEDULES HIS COLONOSCOPY 3 TIMES SINCE JAN 2017, WE WILL NEED TO DISCHARGE HIM FROM THE PRACTICE DUE TO INABILITY TO ADHERE TO PLAN OF CARE.

## 2016-02-11 NOTE — Telephone Encounter (Signed)
Gastroenterology Pre-Procedure Review  Request Date: 02/11/2016 Requesting Physician:   PATIENT REVIEW QUESTIONS: The patient responded to the following health history questions as indicated:    1. Diabetes Melitis: no 2. Joint replacements in the past 12 months: no 3. Major health problems in the past 3 months: no 4. Has an artificial valve or MVP: no 5. Has a defibrillator: no 6. Has been advised in past to take antibiotics in advance of a procedure like teeth cleaning: no 7. Family history of colon cancer: no  8. Alcohol Use: YES 9. History of sleep apnea: no     MEDICATIONS & ALLERGIES:    Patient reports the following regarding taking any blood thinners:   Plavix? no Aspirin? YES Coumadin? no  Patient confirms/reports the following medications:  Current Outpatient Prescriptions  Medication Sig Dispense Refill  . amLODipine (NORVASC) 5 MG tablet Take 5 mg by mouth daily.    Marland Kitchen aspirin EC 81 MG tablet Take 81 mg by mouth daily.    Marland Kitchen atorvastatin (LIPITOR) 20 MG tablet Take 20 mg by mouth daily.    . fluticasone (FLONASE) 50 MCG/ACT nasal spray Place 2 sprays into both nostrils daily. (Patient not taking: Reported on 10/11/2015) 16 g 0  . losartan-hydrochlorothiazide (HYZAAR) 100-25 MG per tablet Take 1 tablet by mouth daily.    . methocarbamol (ROBAXIN) 500 MG tablet Take 1 tablet (500 mg total) by mouth 2 (two) times daily. (Patient not taking: Reported on 10/11/2015) 20 tablet 0  . Na Sulfate-K Sulfate-Mg Sulf (SUPREP BOWEL PREP) SOLN Take 1 kit by mouth as directed. 1 Bottle 0  . polyethylene glycol-electrolytes (TRILYTE) 420 g solution Take 4,000 mLs by mouth as directed. 4000 mL 0  . predniSONE (DELTASONE) 20 MG tablet Take 2 tablets (40 mg total) by mouth daily. Take 40 mg by mouth daily for 3 days, then 72m by mouth daily for 3 days, then 126mdaily for 3 days (Patient not taking: Reported on 10/11/2015) 12 tablet 0   No current facility-administered medications for this  visit.    Patient confirms/reports the following allergies:  No Known Allergies  No orders of the defined types were placed in this encounter.    AUTHORIZATION INFORMATION Primary Insurance:   ID #:   Group #: Pre-Cert / Auth required:  Pre-Cert / Auth #:   Secondary Insurance:   ID #: G Andres Ege:  Pre-Cert / Auth required:  Pre-Cert / Auth #:   SCHEDULE INFORMATION: Procedure has been scheduled as follows:  Date:  02/29/2016                 Time:  8:30 Am [facility] [place]  This Gastroenterology Pre-Precedure Review Form is being routed to the following provider(s): SaBarney DrainMD

## 2016-02-11 NOTE — Telephone Encounter (Signed)
[facility] to [facility]

## 2016-02-11 NOTE — Telephone Encounter (Signed)
Ok to [facility]: Lambert Rhonda [facility] TCS.

## 2016-02-11 NOTE — Telephone Encounter (Signed)
I spoke with the patient and he stated he would like another chance and asked if I would speak with Dr. Darrick PennaFields about it and call him back at [phone].  Routing to Dr. Darrick PennaFields for her advice

## 2016-02-12 NOTE — Telephone Encounter (Signed)
Routing to Dor[facility] 

## 2016-02-12 NOTE — Telephone Encounter (Signed)
SUPREP SPLIT DOSING- CLEAR LIQUIDS WITH BREAKFAST.  

## 2016-02-13 DIAGNOSIS — I10 Essential (primary) hypertension: Secondary | ICD-10-CM | POA: Diagnosis not present

## 2016-02-13 DIAGNOSIS — E785 Hyperlipidemia, unspecified: Secondary | ICD-10-CM | POA: Diagnosis not present

## 2016-02-18 ENCOUNTER — Other Ambulatory Visit: Payer: Self-pay

## 2016-02-18 DIAGNOSIS — Z1211 Encounter for screening for malignant neoplasm of colon: Secondary | ICD-10-CM

## 2016-02-18 NOTE — Telephone Encounter (Signed)
I called pt and reviewed his instructions and told him his prescription was sent in on 11/22/2015. He could not afford the Suprep and wanted the [place]. I am sending him new instructions and I told him to call if he has questions.

## 2016-02-29 ENCOUNTER — Ambulatory Visit (HOSPITAL_COMMUNITY)
Admission: RE | Admit: 2016-02-29 | Discharge: 2016-02-29 | Disposition: A | Discharge status: Home / Self Care | Payer: Commercial Managed Care - HMO | Source: Ambulatory Visit | Attending: Gastroenterology | Admitting: Gastroenterology

## 2016-02-29 ENCOUNTER — Encounter (HOSPITAL_COMMUNITY)
Admission: RE | Disposition: A | Discharge status: Home / Self Care | Payer: Self-pay | Source: Ambulatory Visit | Attending: Gastroenterology

## 2016-02-29 ENCOUNTER — Encounter (HOSPITAL_COMMUNITY): Payer: Self-pay | Admitting: *Deleted

## 2016-02-29 DIAGNOSIS — Z1211 Encounter for screening for malignant neoplasm of colon: Secondary | ICD-10-CM | POA: Insufficient documentation

## 2016-02-29 DIAGNOSIS — Q438 Other specified congenital malformations of intestine: Secondary | ICD-10-CM | POA: Diagnosis not present

## 2016-02-29 DIAGNOSIS — Z8673 Personal history of transient ischemic attack (TIA), and cerebral infarction without residual deficits: Secondary | ICD-10-CM | POA: Insufficient documentation

## 2016-02-29 DIAGNOSIS — K648 Other hemorrhoids: Secondary | ICD-10-CM | POA: Diagnosis not present

## 2016-02-29 DIAGNOSIS — Z79899 Other long term (current) drug therapy: Secondary | ICD-10-CM | POA: Diagnosis not present

## 2016-02-29 DIAGNOSIS — Z7982 Long term (current) use of aspirin: Secondary | ICD-10-CM | POA: Insufficient documentation

## 2016-02-29 DIAGNOSIS — I10 Essential (primary) hypertension: Secondary | ICD-10-CM | POA: Insufficient documentation

## 2016-02-29 HISTORY — PX: COLONOSCOPY: SHX5424

## 2016-02-29 SURGERY — COLONOSCOPY
Anesthesia: Moderate Sedation

## 2016-02-29 MED ORDER — MIDAZOLAM HCL 5 MG/5ML IJ SOLN
INTRAMUSCULAR | Status: AC
  Filled 2016-02-29: qty 10

## 2016-02-29 MED ORDER — SODIUM CHLORIDE 0.9 % IV SOLN
INTRAVENOUS | Status: DC
Start: 2016-02-29 — End: 2016-02-29
  Administered 2016-02-29: 1000 mL via INTRAVENOUS

## 2016-02-29 MED ORDER — MEPERIDINE HCL 100 MG/ML IJ SOLN
INTRAMUSCULAR | Status: AC
  Filled 2016-02-29: qty 2

## 2016-02-29 MED ORDER — MIDAZOLAM HCL 5 MG/5ML IJ SOLN
INTRAMUSCULAR | Status: DC | PRN
  Administered 2016-02-29 (×2): 2 mg via INTRAVENOUS

## 2016-02-29 MED ORDER — SIMETHICONE 40 MG/0.6ML PO SUSP
ORAL | Status: DC | PRN
  Administered 2016-02-29: 2.5 mL

## 2016-02-29 MED ORDER — MEPERIDINE HCL 100 MG/ML IJ SOLN
INTRAMUSCULAR | Status: DC | PRN
  Administered 2016-02-29: 50 mg via INTRAVENOUS
  Administered 2016-02-29: 25 mg via INTRAVENOUS

## 2016-02-29 NOTE — Op Note (Addendum)
Operating Room Services Patient Name: Scott Ritter Procedure Date: 02/29/2016 8:34 AM MRN: [id number] Date of Birth: 06/02/1961 Attending MD: Jonette Eva , MD CSN: [id number] Age: 55 Admit Type: Outpatient Procedure:                Colonoscopy Indications:              Screening for colorectal malignant neoplasm Providers:                Jonette Eva, MD, Nena Polio, RN, Burke Keels,                            Technician Referring MD:              Medicines:                Meperidine 75 mg IV, Midazolam 4 mg IV Complications:            No immediate complications. Estimated Blood Loss:     Estimated blood loss: none. Procedure:                Pre-Anesthesia Assessment:                           - Prior to the procedure, a History and Physical                            was performed, and patient medications and                            allergies were reviewed. The patient's tolerance of                            previous anesthesia was also reviewed. The risks                            and benefits of the procedure and the sedation                            options and risks were discussed with the patient.                            All questions were answered, and informed consent                            was obtained. Prior Anticoagulants: The patient has                            taken aspirin, last dose was day of procedure. ASA                            Grade Assessment: II - A patient with mild systemic                            disease. After reviewing the risks and benefits,  the patient was deemed in satisfactory condition to                            undergo the procedure.                           After obtaining informed consent, the colonoscope                            was passed under direct vision. Throughout the                            procedure, the patient's blood pressure, pulse, and                            oxygen  saturations were monitored continuously. The                            EC-[id number] ([id number]) scope was introduced through                            the anus and advanced to the the cecum, identified                            by appendiceal orifice and ileocecal valve. The                            colonoscopy was somewhat difficult due to a                            tortuous colon. Successful completion of the                            procedure was aided by straightening and shortening                            the scope to obtain bowel loop reduction. The                            ileocecal valve, appendiceal orifice, and rectum                            were photographed. The quality of the bowel                            preparation was good. Scope In: 9:16:32 AM Scope Out: 9:33:05 AM Scope Withdrawal Time: 0 hours 11 minutes 21 seconds  Total Procedure Duration: 0 hours 16 minutes 33 seconds  Findings:      The digital rectal exam was normal.      The recto-sigmoid colon was mildly redundant.      Non-bleeding internal hemorrhoids were found. The hemorrhoids were small. Impression:               - Redundant colon.                           -  Non-bleeding internal hemorrhoids. Moderate Sedation:      Moderate (conscious) sedation was administered by the endoscopy nurse       and supervised by the endoscopist. The following parameters were       monitored: oxygen saturation, heart rate, blood pressure, and response       to care. Total physician intraservice time was 34 minutes. Recommendation:           - High fiber diet.                           - Repeat colonoscopy in 10 years for surveillance.                           - Patient has a contact number available for                            emergencies. The signs and symptoms of potential                            delayed complications were discussed with the                            patient. Return to normal  activities tomorrow.                            Written discharge instructions were provided to the                            patient.                           DRINK WATER TO KEEP YOUR URINE LIGHT YELLOW.                           FOLLOW A HIGH FIBER DIET. AVOID ITEMS THAT CAUSE                            BLOATING. SEE INFO BELOW.                           USE PREPARATION H FOUR TIMES A DAY IF NEEDED TO                            RELIEVE RECTAL PAIN/PRESSURE/BLEEDING.                           Next colonoscopy in 10 years.                           - Continue present medications. Procedure Code(s):        --- Professional ---                           N8295, Colorectal cancer screening; colonoscopy on  individual not meeting criteria for high risk                           99153, Moderate sedation services; each additional                            15 minutes intraservice time                           G0500, Moderate sedation services provided by the                            same physician or other qualified health care                            professional performing a gastrointestinal                            endoscopic service that sedation supports,                            requiring the presence of an independent trained                            observer to assist in the monitoring of the                            patient's level of consciousness and physiological                            status; initial 15 minutes of intra-service time;                            patient age 36 years or older (additional time may                            be reported with 40981, as appropriate) Diagnosis Code(s):        --- Professional ---                           Z12.11, Encounter for screening for malignant                            neoplasm of colon                           K64.8, Other hemorrhoids                           Q43.8, Other specified  congenital malformations of                            intestine CPT copyright 2016 [employer]. All rights reserved. The codes documented in this report are preliminary and upon coder review may  be revised to meet current compliance requirements. Jeannie Mallinger  Darrick PennaFields, MD Jonette EvaSandi Thedford Bunton, MD 02/29/2016 9:00:03 PM This report has been signed electronically. Number of Addenda: 0

## 2016-02-29 NOTE — Discharge Instructions (Signed)
You have internal hemorrhoids. YOU DID NOT HAVE ANY POLYPS. ° ° °DRINK WATER TO KEEP YOUR URINE LIGHT YELLOW. ° °FOLLOW A HIGH FIBER DIET. AVOID ITEMS THAT CAUSE BLOATING. SEE INFO BELOW. ° °USE PREPARATION H FOUR TIMES  A DAY IF NEEDED TO RELIEVE RECTAL PAIN/PRESSURE/BLEEDING. ° °Next colonoscopy in 10 years. °Colonoscopy °Care After °Read the instructions outlined below and refer to this sheet in the next week. These discharge instructions provide you with general information on caring for yourself after you leave the hospital. While your treatment has been planned according to the most current medical practices available, unavoidable complications occasionally occur. If you have any problems or questions after discharge, call DR. Swetha Rayle, [phone]. ° °ACTIVITY °· You may resume your regular activity, but move at a slower pace for the next 24 hours.  °· Take frequent rest periods for the next 24 hours.  °· Walking will help get rid of the air and reduce the bloated feeling in your belly (abdomen).  °· No driving for 24 hours (because of the medicine (anesthesia) used during the test).  °· You may shower.  °· Do not sign any important legal documents or operate any machinery for 24 hours (because of the anesthesia used during the test).  °·  °NUTRITION °· Drink plenty of fluids.  °· You may resume your normal diet as instructed by your doctor.  °· Begin with a light meal and progress to your normal diet. Heavy or fried foods are harder to digest and may make you feel sick to your stomach (nauseated).  °· Avoid alcoholic beverages for 24 hours or as instructed.  °·  °MEDICATIONS °· You may resume your normal medications. °·  °WHAT YOU CAN EXPECT TODAY °· Some feelings of bloating in the abdomen.  °· Passage of more gas than usual.  °· Spotting of blood in your stool or on the toilet paper °· .  °IF YOU HAD POLYPS REMOVED DURING THE COLONOSCOPY: °· Eat a soft diet IF YOU HAVE NAUSEA, BLOATING, ABDOMINAL PAIN, OR  VOMITING. °·   °FINDING OUT THE RESULTS OF YOUR TEST °Not all test results are available during your visit. DR. Lollie Gunner WILL CALL YOU WITHIN 7 DAYS OF YOUR PROCEDUE WITH YOUR RESULTS. Do not assume everything is normal if you have not heard from DR. Adelaida Reindel IN ONE WEEK, CALL HER OFFICE AT [phone]. ° °SEEK IMMEDIATE MEDICAL ATTENTION AND CALL THE OFFICE: [phone] IF: °· You have more than a spotting of blood in your stool.  °· Your belly is swollen (abdominal distention).  °· You are nauseated or vomiting.  °· You have a temperature over 101F.  °· You have abdominal pain or discomfort that is severe or gets worse throughout the day. ° °High-Fiber Diet °A high-fiber diet changes your normal diet to include more whole grains, legumes, fruits, and vegetables. Changes in the diet involve replacing refined carbohydrates with unrefined foods. The calorie level of the diet is essentially unchanged. The Dietary Reference Intake (recommended amount) for adult males is 38 grams per day. For adult females, it is 25 grams per day. Pregnant and lactating women should consume 28 grams of fiber per day. °Fiber is the intact part of a plant that is not broken down during digestion. Functional fiber is fiber that has been isolated from the plant to provide a beneficial effect in the body. °PURPOSE °· Increase stool bulk.  °· Ease and regulate bowel movements.  °· Lower cholesterol.  °· REDUCE RISK OF COLON   CANCER ° °INDICATIONS THAT YOU NEED MORE FIBER °· Constipation and hemorrhoids.  °· Uncomplicated diverticulosis (intestine condition) and irritable bowel syndrome.  °· Weight management.  °· As a protective measure against hardening of the arteries (atherosclerosis), diabetes, and cancer.  ° °GUIDELINES FOR INCREASING FIBER IN THE DIET °· Start adding fiber to the diet slowly. A gradual increase of about 5 more grams (2 slices of whole-wheat bread, 2 servings of most fruits or vegetables, or 1 bowl of high-fiber cereal) per  day is best. Too rapid an increase in fiber may result in constipation, flatulence, and bloating.  °· Drink enough water and fluids to keep your urine clear or pale yellow. Water, juice, or caffeine-free drinks are recommended. Not drinking enough fluid may cause constipation.  °· Eat a variety of high-fiber foods rather than one type of fiber.  °· Try to increase your intake of fiber through using high-fiber foods rather than fiber pills or supplements that contain small amounts of fiber.  °· The goal is to change the types of food eaten. Do not supplement your present diet with high-fiber foods, but replace foods in your present diet.  ° °INCLUDE A VARIETY OF FIBER SOURCES °· Replace refined and processed grains with whole grains, canned fruits with fresh fruits, and incorporate other fiber sources. White rice, white breads, and most bakery goods contain little or no fiber.  °· Brown whole-grain rice, buckwheat oats, and many fruits and vegetables are all good sources of fiber. These include: broccoli, Brussels sprouts, cabbage, cauliflower, beets, sweet potatoes, white potatoes (skin on), carrots, tomatoes, eggplant, squash, berries, fresh fruits, and dried fruits.  °· Cereals appear to be the richest source of fiber. Cereal fiber is found in whole grains and bran. Bran is the fiber-rich outer coat of cereal grain, which is largely removed in refining. In whole-grain cereals, the bran remains. In breakfast cereals, the largest amount of fiber is found in those with "bran" in their names. The fiber content is sometimes indicated on the label.  °· You may need to include additional fruits and vegetables each day.  °· In baking, for 1 cup white flour, you may use the following substitutions:  °· 1 cup whole-wheat flour minus 2 tablespoons.  °· 1/2 cup white flour plus 1/2 cup whole-wheat flour.  ° °Hemorrhoids °Hemorrhoids are dilated (enlarged) veins around the rectum. Sometimes clots will form in the veins. This  makes them swollen and painful. These are called thrombosed hemorrhoids. °Causes of hemorrhoids include: °· Constipation.  °· Straining to have a bowel movement. °·  HEAVY LIFTING ° °HOME CARE INSTRUCTIONS °· Eat a well balanced diet and drink 6 to 8 glasses of water every day to avoid constipation. You may also use a bulk laxative.  °· Avoid straining to have bowel movements.  °· Keep anal area dry and clean.  °· Do not use a donut shaped pillow or sit on the toilet for long periods. This increases blood pooling and pain.  °· Move your bowels when your body has the urge; this will require less straining and will decrease pain and pressure.  ° °

## 2016-02-29 NOTE — H&P (Signed)
  Primary Care Physician:  Maggie Font, MD Primary Gastroenterologist:  Dr. Oneida Alar  Pre-Procedure History & Physical: HPI:  Scott Ritter is a 55 y.o. male here for [place]. Ate sandwich and spaghetti.  Past Medical History  Diagnosis Date  . Hypertension   . Stroke [facility])     Past Surgical History  Procedure Laterality Date  . Back surgery      Prior to Admission medications   Medication Sig Start Date End Date Taking? Authorizing Provider  amLODipine (NORVASC) 10 MG tablet Take 10 mg by mouth daily.   Yes Historical Provider, MD  aspirin EC 81 MG tablet Take 81 mg by mouth daily.   Yes Historical Provider, MD  atorvastatin (LIPITOR) 20 MG tablet Take 20 mg by mouth daily. 05/04/15  Yes Historical Provider, MD  losartan-hydrochlorothiazide (HYZAAR) 100-25 MG per tablet Take 1 tablet by mouth daily. 05/04/15  Yes Historical Provider, MD  polyethylene glycol-electrolytes (TRILYTE) 420 g solution Take 4,000 mLs by mouth as directed. 11/22/15  Yes Danie Binder, MD  Na Sulfate-K Sulfate-Mg Sulf (SUPREP BOWEL PREP) SOLN Take 1 kit by mouth as directed. Patient not taking: Reported on 02/19/2016 10/17/15   Danie Binder, MD    Allergies as of 02/18/2016  . (No Known Allergies)    Family History  Problem Relation Age of Onset  . Hypertension Father     Social History   Social History  . Marital Status: Married    Spouse Name: N/A  . Number of Children: N/A  . Years of Education: N/A   Occupational History  . Not on file.   Social History Main Topics  . Smoking status: Never Smoker   . Smokeless tobacco: Not on file  . Alcohol Use: Yes     Comment: occasionally  . Drug Use: No  . Sexual Activity: Not on file   Other Topics Concern  . Not on file   Social History Narrative    Review of Systems: See HPI, otherwise negative ROS   Physical Exam: BP 150/81 mmHg  Pulse 62  Temp(Src) 97.8 F (36.6 C) (Oral)  Resp 13  Ht _0  (1.702 m)  Wt 211  lb (95.709 kg)  BMI 33.04 kg/m2  SpO2 99% General:   Alert,  pleasant and cooperative in NAD Head:  Normocephalic and atraumatic. Neck:  Supple; Lungs:  Clear throughout to auscultation.    Heart:  Regular rate and rhythm. Abdomen:  Soft, nontender and nondistended. Normal bowel sounds, without guarding, and without rebound.   Neurologic:  Alert and  oriented x4;  grossly normal neurologically.  Impression/Plan:     SCREENING  Plan:  1. TCS TODAY

## 2016-03-04 ENCOUNTER — Encounter (HOSPITAL_COMMUNITY): Payer: Self-pay | Admitting: Gastroenterology

## 2016-12-15 ENCOUNTER — Encounter (HOSPITAL_COMMUNITY): Payer: Self-pay | Admitting: *Deleted

## 2016-12-15 DIAGNOSIS — K226 Gastro-esophageal laceration-hemorrhage syndrome: Secondary | ICD-10-CM | POA: Diagnosis not present

## 2016-12-15 DIAGNOSIS — E876 Hypokalemia: Secondary | ICD-10-CM | POA: Diagnosis not present

## 2016-12-15 DIAGNOSIS — Z79899 Other long term (current) drug therapy: Secondary | ICD-10-CM | POA: Diagnosis not present

## 2016-12-15 DIAGNOSIS — Z23 Encounter for immunization: Secondary | ICD-10-CM | POA: Insufficient documentation

## 2016-12-15 DIAGNOSIS — R109 Unspecified abdominal pain: Secondary | ICD-10-CM | POA: Diagnosis present

## 2016-12-15 DIAGNOSIS — Z7982 Long term (current) use of aspirin: Secondary | ICD-10-CM | POA: Diagnosis not present

## 2016-12-15 DIAGNOSIS — I10 Essential (primary) hypertension: Secondary | ICD-10-CM | POA: Insufficient documentation

## 2016-12-15 DIAGNOSIS — Z8673 Personal history of transient ischemic attack (TIA), and cerebral infarction without residual deficits: Secondary | ICD-10-CM | POA: Insufficient documentation

## 2016-12-15 NOTE — ED Triage Notes (Signed)
Pt c/o abdominal pain and vomiting bright red blood x 3 hours

## 2016-12-16 ENCOUNTER — Emergency Department (HOSPITAL_COMMUNITY): Payer: Medicare HMO

## 2016-12-16 ENCOUNTER — Observation Stay (HOSPITAL_COMMUNITY)
Admission: EM | Admit: 2016-12-16 | Discharge: 2016-12-17 | Disposition: A | Discharge status: Home / Self Care | Payer: Medicare HMO | Attending: Internal Medicine | Admitting: Internal Medicine

## 2016-12-16 ENCOUNTER — Observation Stay (HOSPITAL_COMMUNITY): Payer: Medicare HMO

## 2016-12-16 ENCOUNTER — Encounter (HOSPITAL_COMMUNITY): Payer: Self-pay | Admitting: Internal Medicine

## 2016-12-16 DIAGNOSIS — K922 Gastrointestinal hemorrhage, unspecified: Secondary | ICD-10-CM | POA: Diagnosis not present

## 2016-12-16 DIAGNOSIS — K226 Gastro-esophageal laceration-hemorrhage syndrome: Secondary | ICD-10-CM | POA: Diagnosis present

## 2016-12-16 DIAGNOSIS — I10 Essential (primary) hypertension: Secondary | ICD-10-CM

## 2016-12-16 DIAGNOSIS — E876 Hypokalemia: Secondary | ICD-10-CM | POA: Diagnosis present

## 2016-12-16 DIAGNOSIS — K920 Hematemesis: Secondary | ICD-10-CM | POA: Diagnosis present

## 2016-12-16 LAB — CBC
HCT: 39 % (ref 39.0–52.0)
HCT: 36.1 % — ABNORMAL LOW (ref 39.0–52.0)
HCT: 35.8 % — ABNORMAL LOW (ref 39.0–52.0)
HEMATOCRIT: 35.7 % — AB (ref 39.0–52.0)
HEMOGLOBIN: 12 g/dL — AB (ref 13.0–17.0)
HEMOGLOBIN: 12 g/dL — AB (ref 13.0–17.0)
Hemoglobin: 12.9 g/dL — ABNORMAL LOW (ref 13.0–17.0)
Hemoglobin: 12.2 g/dL — ABNORMAL LOW (ref 13.0–17.0)
MCH: 27.9 pg (ref 26.0–34.0)
MCH: 28 pg (ref 26.0–34.0)
MCH: 28.2 pg (ref 26.0–34.0)
MCH: 28.3 pg (ref 26.0–34.0)
MCHC: 33.1 g/dL (ref 30.0–36.0)
MCHC: 33.5 g/dL (ref 30.0–36.0)
MCHC: 33.6 g/dL (ref 30.0–36.0)
MCHC: 33.8 g/dL (ref 30.0–36.0)
MCV: 83.6 fL (ref 78.0–100.0)
MCV: 83.8 fL (ref 78.0–100.0)
MCV: 83.8 fL (ref 78.0–100.0)
MCV: 84.4 fL (ref 78.0–100.0)
PLATELETS: 340 10*3/uL (ref 150–400)
PLATELETS: 298 10*3/uL (ref 150–400)
PLATELETS: 326 10*3/uL (ref 150–400)
Platelets: 318 10*3/uL (ref 150–400)
RBC: 4.26 MIL/uL (ref 4.22–5.81)
RBC: 4.28 MIL/uL (ref 4.22–5.81)
RBC: 4.31 MIL/uL (ref 4.22–5.81)
RBC: 4.62 MIL/uL (ref 4.22–5.81)
RDW: 14.7 % (ref 11.5–15.5)
RDW: 14.8 % (ref 11.5–15.5)
RDW: 14.8 % (ref 11.5–15.5)
RDW: 15 % (ref 11.5–15.5)
WBC: 10.4 10*3/uL (ref 4.0–10.5)
WBC: 12.2 10*3/uL — ABNORMAL HIGH (ref 4.0–10.5)
WBC: 8.7 10*3/uL (ref 4.0–10.5)
WBC: 9.6 10*3/uL (ref 4.0–10.5)

## 2016-12-16 LAB — COMPREHENSIVE METABOLIC PANEL
ALBUMIN: 4.1 g/dL (ref 3.5–5.0)
ALK PHOS: 89 U/L (ref 38–126)
ALT: 14 U/L — ABNORMAL LOW (ref 17–63)
ANION GAP: 9 (ref 5–15)
AST: 25 U/L (ref 15–41)
BILIRUBIN TOTAL: 0.6 mg/dL (ref 0.3–1.2)
BUN: 17 mg/dL (ref 6–20)
CALCIUM: 9.1 mg/dL (ref 8.9–10.3)
CO2: 29 mmol/L (ref 22–32)
Chloride: 102 mmol/L (ref 101–111)
Creatinine, Ser: 1.14 mg/dL (ref 0.61–1.24)
GFR calc non Af Amer: >60 mL/min (ref >60)
GLUCOSE: 106 mg/dL — AB (ref 65–99)
POTASSIUM: 2.6 mmol/L — AB (ref 3.5–5.1)
SODIUM: 140 mmol/L (ref 135–145)
TOTAL PROTEIN: 8 g/dL (ref 6.5–8.1)

## 2016-12-16 LAB — BASIC METABOLIC PANEL
Anion gap: 8 (ref 5–15)
BUN: 10 mg/dL (ref 6–20)
CHLORIDE: 103 mmol/L (ref 101–111)
CO2: 27 mmol/L (ref 22–32)
CREATININE: 0.87 mg/dL (ref 0.61–1.24)
Calcium: 8.7 mg/dL — ABNORMAL LOW (ref 8.9–10.3)
GFR calc non Af Amer: >60 mL/min (ref >60)
Glucose, Bld: 97 mg/dL (ref 65–99)
Potassium: 2.7 mmol/L — CL (ref 3.5–5.1)
SODIUM: 138 mmol/L (ref 135–145)

## 2016-12-16 LAB — URINALYSIS, ROUTINE W REFLEX MICROSCOPIC
BACTERIA UA: NONE SEEN
BILIRUBIN URINE: NEGATIVE
Glucose, UA: NEGATIVE mg/dL
Hgb urine dipstick: NEGATIVE
KETONES UR: NEGATIVE mg/dL
LEUKOCYTES UA: NEGATIVE
NITRITE: NEGATIVE
PROTEIN: 30 mg/dL — AB
Specific Gravity, Urine: 1.02 (ref 1.005–1.030)
pH: 6 (ref 5.0–8.0)

## 2016-12-16 LAB — MRSA PCR SCREENING: MRSA BY PCR: NEGATIVE

## 2016-12-16 LAB — TYPE AND SCREEN
ABO/RH(D): A POS
ANTIBODY SCREEN: NEGATIVE

## 2016-12-16 LAB — LIPASE, BLOOD: Lipase: 16 U/L (ref 11–51)

## 2016-12-16 MED ORDER — SODIUM CHLORIDE 0.9 % IV BOLUS (SEPSIS)
1000.0000 mL | Freq: Once | INTRAVENOUS | Status: AC
  Administered 2016-12-16: 1000 mL via INTRAVENOUS

## 2016-12-16 MED ORDER — SODIUM CHLORIDE 0.9 % IV SOLN
8.0000 mg/h | INTRAVENOUS | Status: DC
  Administered 2016-12-16 (×2): 8 mg/h via INTRAVENOUS
  Filled 2016-12-16 (×4): qty 80

## 2016-12-16 MED ORDER — POTASSIUM CHLORIDE 10 MEQ/100ML IV SOLN
10.0000 meq | INTRAVENOUS | Status: AC
  Administered 2016-12-16 (×3): 10 meq via INTRAVENOUS
  Filled 2016-12-16 (×3): qty 100

## 2016-12-16 MED ORDER — SODIUM CHLORIDE 0.9% FLUSH
3.0000 mL | Freq: Two times a day (BID) | INTRAVENOUS | Status: DC
  Administered 2016-12-16: 3 mL via INTRAVENOUS

## 2016-12-16 MED ORDER — ONDANSETRON HCL 4 MG PO TABS: 4.0000 mg | ORAL_TABLET | Freq: Four times a day (QID) | ORAL | Status: DC | PRN

## 2016-12-16 MED ORDER — PANTOPRAZOLE SODIUM 40 MG IV SOLR
40.0000 mg | Freq: Once | INTRAVENOUS | Status: AC
  Administered 2016-12-16: 40 mg via INTRAVENOUS
  Filled 2016-12-16: qty 40

## 2016-12-16 MED ORDER — POTASSIUM CHLORIDE 10 MEQ/100ML IV SOLN
INTRAVENOUS | Status: AC
  Filled 2016-12-16: qty 100

## 2016-12-16 MED ORDER — ONDANSETRON HCL 4 MG/2ML IJ SOLN: 4.0000 mg | Freq: Four times a day (QID) | INTRAMUSCULAR | Status: DC | PRN

## 2016-12-16 MED ORDER — PANTOPRAZOLE SODIUM 40 MG IV SOLR
40.0000 mg | Freq: Two times a day (BID) | INTRAVENOUS | Status: DC
  Administered 2016-12-16 – 2016-12-17 (×2): 40 mg via INTRAVENOUS
  Filled 2016-12-16 (×2): qty 40

## 2016-12-16 MED ORDER — ATORVASTATIN CALCIUM 20 MG PO TABS
20.0000 mg | ORAL_TABLET | Freq: Every day | ORAL | Status: DC
  Administered 2016-12-16 – 2016-12-17 (×2): 20 mg via ORAL
  Filled 2016-12-16 (×2): qty 1

## 2016-12-16 MED ORDER — POTASSIUM CHLORIDE 10 MEQ/100ML IV SOLN
10.0000 meq | Freq: Once | INTRAVENOUS | Status: AC
  Administered 2016-12-16: 10 meq via INTRAVENOUS

## 2016-12-16 MED ORDER — KCL IN DEXTROSE-NACL 20-5-0.9 MEQ/L-%-% IV SOLN
INTRAVENOUS | Status: DC
  Administered 2016-12-16 (×2): via INTRAVENOUS

## 2016-12-16 MED ORDER — PANTOPRAZOLE SODIUM 40 MG IV SOLR
INTRAVENOUS | Status: AC
  Filled 2016-12-16: qty 80

## 2016-12-16 MED ORDER — PANTOPRAZOLE SODIUM 40 MG IV SOLR: 40.0000 mg | Freq: Two times a day (BID) | INTRAVENOUS | Status: DC

## 2016-12-16 MED ORDER — INFLUENZA VAC SPLIT QUAD 0.5 ML IM SUSY
0.5000 mL | PREFILLED_SYRINGE | INTRAMUSCULAR | Status: AC
  Administered 2016-12-17: 0.5 mL via INTRAMUSCULAR
  Filled 2016-12-16: qty 0.5

## 2016-12-16 NOTE — Care Management Note (Signed)
Case Management Note  Patient Details  Name: Scott Ritter MRN: [id number] Date of Birth: 14-Feb-1961  Subjective/Objective:   Patient adm from home with hematemesis/hypokalmeia. He is from home with wife, ind with ADL's. Has cane if needed. Has PCP, still drives to appointments, no issues affording medications.               Action/Plan: Anticipate DC home with self care. No CM needs known. Awaiting GI consult.   Expected Discharge Date:  12/21/16               Expected Discharge Plan:  Home/Self Care  In-House Referral:     Discharge planning Services  CM Consult  Post Acute Care Choice:  NA Choice offered to:  NA  DME Arranged:    DME Agency:     HH Arranged:    HH Agency:     Status of Service:  Completed, signed off  If discussed at [employer] Length of Stay Meetings, dates discussed:    Additional Comments:  Scott Ritter, Scott OilerSharley Diane, RN 12/16/2016, 2:15 PM

## 2016-12-16 NOTE — ED Provider Notes (Signed)
[facility] DEPT Provider Note   CSN: [id number] Arrival date & time: 12/15/16  2302  Time seen 03:45 AM   History   Chief Complaint Chief Complaint  Patient presents with  . Abdominal Pain    HPI Scott Ritter is a 56 y.o. male.  HPI  patient states he felt fine all day. He states he ate about 1:30 PM and ate a  [place] melt sandwich with strawberry cheesecake and afterwards just felt full all day. He states about 6 PM he started getting achy generalized abdominal discomfort and about an hour later he had nausea and vomiting and felt better. However about 10 PM he started feeling nauseated again and he had another episode of vomiting and this time it was just a large amount of bright red blood. He states the blood was so thick you couldn't see through the toilet water. He states afterwards he felt weak although that's improving. He states he did have some burning in his epigastric area afterwards. He states he has mild nausea but no diarrhea, melena. He denies being on blood thinners. He states he's never had this happen before.  PCP Evlyn Courier, MD   Past Medical History:  Diagnosis Date  . Hypertension   . Stroke [facility])     Patient Active Problem List   Diagnosis Date Noted  . Special screening for malignant neoplasms, colon     Past Surgical History:  Procedure Laterality Date  . BACK SURGERY    . COLONOSCOPY N/A 02/29/2016   Procedure: COLONOSCOPY;  Surgeon: West Bali, MD;  Location: [facility] ENDO SUITE;  Service: Endoscopy;  Laterality: N/A;  8:30 Am  . HERNIA REPAIR         Home Medications    Prior to Admission medications   Medication Sig Start Date End Date Taking? Authorizing Provider  amLODipine (NORVASC) 10 MG tablet Take 10 mg by mouth daily.    Historical Provider, MD  aspirin EC 81 MG tablet Take 81 mg by mouth daily.    Historical Provider, MD  atorvastatin (LIPITOR) 20 MG tablet Take 20 mg by mouth daily. 05/04/15   Historical Provider, MD    losartan-hydrochlorothiazide (HYZAAR) 100-25 MG per tablet Take 1 tablet by mouth daily. 05/04/15   Historical Provider, MD    Family History Family History  Problem Relation Age of Onset  . Hypertension Father     Social History Social History  Substance Use Topics  . Smoking status: Never Smoker  . Smokeless tobacco: Never Used  . 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   Review of Systems Review of Systems  All other systems reviewed and are negative.    Physical Exam Updated Vital Signs BP 167/97 (BP Location: Right Wrist)   Pulse 62   Temp 98.3 F (36.8 C) (Oral)   Resp 16   Ht 5\' 7"  (1.702 m)   Wt 212 lb (96.2 kg)   SpO2 99%   BMI 33.20 kg/m   Vital signs normal except for hypertension   Physical Exam  Constitutional: He is oriented to person, place, and time. He appears well-developed and well-nourished.  Non-toxic appearance. He does not appear ill. No distress.  HENT:  Head: Normocephalic and atraumatic.  Right Ear:  External ear normal.  Left Ear: External ear normal.  Nose: Nose normal. No mucosal edema or rhinorrhea.  Mouth/Throat: Oropharynx is clear and moist and mucous membranes are normal. No dental abscesses or uvula swelling.  Eyes: Conjunctivae and EOM are normal. Pupils are equal, round, and reactive to light.  Neck: Normal range of motion and full passive range of motion without pain. Neck supple.  Cardiovascular: Normal rate, regular rhythm and normal heart sounds.  Exam reveals no gallop and no friction rub.   No murmur heard. Pulmonary/Chest: Effort normal and breath sounds normal. No respiratory distress. He has no wheezes. He has no rhonchi. He has no rales. He exhibits no tenderness and no crepitus.  Abdominal: Soft. Normal  appearance and bowel sounds are normal. He exhibits no distension. There is no tenderness. There is no rebound and no guarding.  Musculoskeletal: Normal range of motion. He exhibits no edema or tenderness.  Moves all extremities well.   Neurological: He is alert and oriented to person, place, and time. He has normal strength. No cranial nerve deficit.  Skin: Skin is warm, dry and intact. No rash noted. No erythema. No pallor.  Psychiatric: He has a normal mood and affect. His speech is normal and behavior is normal. His mood appears not anxious.  Nursing note and vitals reviewed.    ED Treatments / Results  Labs (all labs ordered are listed, but only abnormal results are displayed) Results for orders placed or performed during the hospital encounter of 12/16/16  Lipase, blood  Result Value Ref Range   Lipase 16 11 - 51 U/L  Comprehensive metabolic panel  Result Value Ref Range   Sodium 140 135 - 145 mmol/L   Potassium 2.6 (LL) 3.5 - 5.1 mmol/L   Chloride 102 101 - 111 mmol/L   CO2 29 22 - 32 mmol/L   Glucose, Bld 106 (H) 65 - 99 mg/dL   BUN 17 6 - 20 mg/dL   Creatinine, Ser 1.61 0.61 - 1.24 mg/dL   Calcium 9.1 8.9 - 09.6 mg/dL   Total Protein 8.0 6.5 - 8.1 g/dL   Albumin 4.1 3.5 - 5.0 g/dL   AST 25 15 - 41 U/L   ALT 14 (L) 17 - 63 U/L   Alkaline Phosphatase 89 38 - 126 U/L   Total Bilirubin 0.6 0.3 - 1.2 mg/dL   GFR calc non Af Amer >60 >60 mL/min   GFR calc Af Amer >60 >60 mL/min   Anion gap 9 5 - 15  CBC  Result Value Ref Range   WBC 12.2 (H) 4.0 - 10.5 K/uL   RBC 4.26 4.22 - 5.81 MIL/uL   Hemoglobin 12.0 (L) 13.0 - 17.0 g/dL   HCT 04.5 (L) 40.9 - 81.1 %   MCV 83.8 78.0 - 100.0 fL   MCH 28.2 26.0 - 34.0 pg   MCHC 33.6 30.0 - 36.0 g/dL   RDW 91.4 78.2 - 95.6 %   Platelets 318 150 - 400 K/uL  Urinalysis, Routine w reflex microscopic  Result Value Ref Range   Color, Urine YELLOW YELLOW   APPearance CLEAR CLEAR   Specific Gravity, Urine 1.020 1.005 - 1.030   pH 6.0  5.0 - 8.0   Glucose, UA NEGATIVE NEGATIVE mg/dL   Hgb urine dipstick NEGATIVE NEGATIVE   Bilirubin Urine NEGATIVE NEGATIVE   Ketones, ur NEGATIVE NEGATIVE mg/dL   Protein, ur 30 (A) NEGATIVE mg/dL   Nitrite NEGATIVE NEGATIVE   Leukocytes, UA NEGATIVE NEGATIVE  RBC / HPF 0-5 0 - 5 RBC/hpf   WBC, UA 0-5 0 - 5 WBC/hpf   Bacteria, UA NONE SEEN NONE SEEN   Mucous PRESENT    Laboratory interpretation all normal except Hypokalemia (patient has had hypokalemia down to 2.6 couple times in the past), leukocytosis, mild anemia    EKG  EKG Interpretation  Date/Time:  Tuesday December 16 2016 04:03:37 EST Ventricular Rate:  51 PR Interval:    QRS Duration: 112 QT Interval:  485 QTC Calculation: 447 R Axis:   94 Text Interpretation:  Sinus rhythm Borderline intraventricular conduction delay Borderline T abnormalities, inferior leads No significant change since last tracing 19 Oct 2008 Confirmed by [facility]  MD-I, Paige Monarrez ([id number]) on 12/16/2016 4:43:12 AM       Radiology No results found.  Procedures Procedures (including critical care time)  Medications Ordered in ED Medications  potassium chloride 10 mEq in 100 mL IVPB (10 mEq Intravenous New Bag/Given 12/16/16 0442)  pantoprazole (PROTONIX) 80 mg in sodium chloride 0.9 % 250 mL (0.32 mg/mL) infusion (not administered)  pantoprazole (PROTONIX) injection 40 mg (not administered)  pantoprazole (PROTONIX) injection 40 mg (not administered)  sodium chloride 0.9 % bolus 1,000 mL (1,000 mLs Intravenous New Bag/Given 12/16/16 0432)  pantoprazole (PROTONIX) injection 40 mg (40 mg Intravenous Given 12/16/16 0434)     Initial Impression / Assessment and Plan / ED Course  I have reviewed the triage vital signs and the nursing notes.  Pertinent labs & imaging results that were available during my care of the patient were reviewed by me and considered in my medical decision making (see chart for details).  Patient was given IV fluids and started on IV  Protonix. After reviewing his laboratory results (I was not contacted them being low him (he was started on IV runs of potassium since at this point he should be nothing by mouth. We discussed admission to get endoscopy in the morning. We discussed most likely had a Mallory-Weiss tear however he could have some peptic ulcer disease or esophagitis. Patient is agreeable.  Orthostatic VS for the past 24 hrs:  BP- Lying Pulse- Lying BP- Sitting Pulse- Sitting BP- Standing at 0 minutes Pulse- Standing at 0 minutes  12/16/16 0354 156/78 57 159/85 58 169/83 61     Normal orthostatics  04:28 AM Dr Conley RollsLe, hospitalist, wants patient to get the full 80 mg bolus of Protonix  and start on a Protonix drip. This was done. He will admit the patient.    Final Clinical Impressions(s) / ED Diagnoses   Final diagnoses:  Acute upper GI bleed  Hypokalemia    Plan admission  Devoria AlbeIva Trannie Bardales, MD, Concha PyoFACEP     Ondria Oswald, MD 12/16/16 [phone]

## 2016-12-16 NOTE — ED Notes (Signed)
Orthostatic vitals taken. EKG done

## 2016-12-16 NOTE — ED Notes (Signed)
CRITICAL VALUE ALERT  Critical value received:  Potassium 2.6  Date of notification:  12/16/2016  Time of notification:  0031  Critical value read back: yes  Nurse who received alert:  Casimiro NeedleMichael, RN  MD notified (1st page):  Lynelle DoctorKnapp  Time of first page:  [phone]

## 2016-12-16 NOTE — Care Management Obs Status (Signed)
MEDICARE OBSERVATION STATUS NOTIFICATION   Patient Details  Name: Scott Ritter MRN: [id number] Date of Birth: Sep 29, 1961   Medicare Observation Status Notification Given:  Yes    Jalayne Ganesh, Chrystine OilerSharley Diane, RN 12/16/2016, 2:14 PM

## 2016-12-16 NOTE — Progress Notes (Signed)
12/16/2016  3:04 AM  12/16/2016 7:21 AM  Scott Ritter was seen and examined.  The H&P by the admitting provider, orders, imaging was reviewed.  Please see orders.  Will continue to follow.   Maryln Manuel. Johnson, MD [facility]

## 2016-12-16 NOTE — H&P (Signed)
History and Physical    [name]:[id number] DOB: April 27, 1961 DOA: 12/16/2016  PCP: Evlyn Courier, MD  Patient coming from: Home.    Chief Complaint:   Hematemesis.   HPI: Scott Ritter is an 56 y.o. male with hx of HTN on Norvasc and Zesteretic, hx of prior CVA, a rare beer drinker, presented to the ER with hematemesis.  He had been nauseated earlier today, with no abdominal pain, and vomited food earlier today, had some retching, then a few hours later, he vomited bright red blood.  He denied dark or old blood, and had no melena.  He denied lightheadedness or diaphoresis.  In the ER, his SBP was 160, and his Hb was 12 g per dL, with BUN of 17.  He was found to have a K of 2.6, and his Cr was normal. His LFT and Lipase were normal.  Hospitalist was asked to admit him for severe hypokalemia, diuretic induced, and hematemesis, likely from a MW tear.  He did not have a CXR.   ED Course:  See above.  Rewiew of Systems:  Constitutional: Negative for malaise, fever and chills. No significant weight loss or weight gain Eyes: Negative for eye pain, redness and discharge, diplopia, visual changes, or flashes of light. ENMT: Negative for ear pain, hoarseness, nasal congestion, sinus pressure and sore throat. No headaches; tinnitus, drooling, or problem swallowing. Cardiovascular: Negative for chest pain, palpitations, diaphoresis, dyspnea and peripheral edema. ; No orthopnea, PND Respiratory: Negative for cough, hemoptysis, wheezing and stridor. No pleuritic chestpain. Gastrointestinal: Negative for diarrhea, constipation,  melena, blood in stool, jaundice and rectal bleeding.    Genitourinary: Negative for frequency, dysuria, incontinence,flank pain and hematuria; Musculoskeletal: Negative for back pain and neck pain. Negative for swelling and trauma.;  Skin: . Negative for pruritus, rash, abrasions, bruising and skin lesion.; ulcerations Neuro: Negative for headache, lightheadedness  and neck stiffness. Negative for weakness, altered level of consciousness , altered mental status, extremity weakness, burning feet, involuntary movement, seizure and syncope.  Psych: negative for anxiety, depression, insomnia, tearfulness, panic attacks, hallucinations, paranoia, suicidal or homicidal ideation    Past Medical History:  Diagnosis Date  . Hypertension   . Stroke [facility])     Past Surgical History:  Procedure Laterality Date  . BACK SURGERY    . COLONOSCOPY N/A 02/29/2016   Procedure: COLONOSCOPY;  Surgeon: West Bali, MD;  Location: [facility] ENDO SUITE;  Service: Endoscopy;  Laterality: N/A;  8:30 Am  . HERNIA REPAIR       reports that he has never smoked. He has never used smokeless tobacco. He reports that he drinks alcohol. He reports that he does not use drugs.  No Known Allergies  Family History  Problem Relation Age of Onset  . Hypertension Father      Prior to Admission medications   Medication Sig Start Date End Date Taking? Authorizing Provider  amLODipine (NORVASC) 10 MG tablet Take 10 mg by mouth daily.    Historical Provider, MD  aspirin EC 81 MG tablet Take 81 mg by mouth daily.    Historical Provider, MD  atorvastatin (LIPITOR) 20 MG tablet Take 20 mg by mouth daily. 05/04/15   Historical Provider, MD  losartan-hydrochlorothiazide (HYZAAR) 100-25 MG per tablet Take 1 tablet by mouth daily. 05/04/15   Historical Provider, MD    Physical Exam: Vitals:   12/15/16 2339 12/16/16 0433  BP: 155/84 167/97  Pulse: 65 62  Resp: 18 16  Temp: 98.3 F (  36.8 C)   TempSrc: Oral   SpO2: 99% 99%  Weight: 96.2 kg (212 lb)   Height: 5\' 7"  (1.702 m)    Constitutional: NAD, calm, comfortable Vitals:   12/15/16 2339 12/16/16 0433  BP: 155/84 167/97  Pulse: 65 62  Resp: 18 16  Temp: 98.3 F (36.8 C)   TempSrc: Oral   SpO2: 99% 99%  Weight: 96.2 kg (212 lb)   Height: 5\' 7"  (1.702 m)    Eyes: PERRL, lids and conjunctivae normal ENMT: Mucous membranes are  moist. Posterior pharynx clear of any exudate or lesions.Normal dentition.  Neck: normal, supple, no masses, no thyromegaly Respiratory: clear to auscultation bilaterally, no wheezing, no crackles. Normal respiratory effort. No accessory muscle use.  Cardiovascular: Regular rate and rhythm, no murmurs / rubs / gallops. No extremity edema. 2+ pedal pulses. No carotid bruits.  Abdomen: no tenderness, no masses palpated. No hepatosplenomegaly. Bowel sounds positive.  Musculoskeletal: no clubbing / cyanosis. No joint deformity upper and lower extremities. Good ROM, no contractures. Normal muscle tone.  Skin: no rashes, lesions, ulcers. No induration Neurologic: CN 2-12 grossly intact. Sensation intact, DTR normal. Strength 5/5 in all 4.  Psychiatric: Normal judgment and insight. Alert and oriented x 3. Normal mood.    Labs on Admission: I have personally reviewed following labs and imaging studies  CBC:  Recent Labs Lab 12/15/16 2351  WBC 12.2*  HGB 12.0*  HCT 35.7*  MCV 83.8  PLT 318   Basic Metabolic Panel:  Recent Labs Lab 12/15/16 2351  NA 140  K 2.6*  CL 102  CO2 29  GLUCOSE 106*  BUN 17  CREATININE 1.14  CALCIUM 9.1   GFR: Estimated Creatinine Clearance: 82.8 mL/min (by C-G formula based on SCr of 1.14 mg/dL). Liver Function Tests:  Recent Labs Lab 12/15/16 2351  AST 25  ALT 14*  ALKPHOS 89  BILITOT 0.6  PROT 8.0  ALBUMIN 4.1    Recent Labs Lab 12/15/16 2351  LIPASE 16   Urine analysis:    Component Value Date/Time   COLORURINE YELLOW 12/16/2016 0330   APPEARANCEUR CLEAR 12/16/2016 0330   LABSPEC 1.020 12/16/2016 0330   PHURINE 6.0 12/16/2016 0330   GLUCOSEU NEGATIVE 12/16/2016 0330   HGBUR NEGATIVE 12/16/2016 0330   BILIRUBINUR NEGATIVE 12/16/2016 0330   KETONESUR NEGATIVE 12/16/2016 0330   PROTEINUR 30 (A) 12/16/2016 0330   UROBILINOGEN 1.0 11/01/2008 1425   NITRITE NEGATIVE 12/16/2016 0330   LEUKOCYTESUR NEGATIVE 12/16/2016 0330    Radiological Exams on Admission:  EKG: Independently reviewed.  Assessment/Plan Principal Problem:   Mallory-Weiss tear Active Problems:   Hematemesis   HTN (hypertension)   Hypokalemia   PLAN:   Hematemesis:  I suspect from the history, that he had a Mallory Weiss tear.  Will obtain and upright CXR to rule Boorhaave's syndrome.  He has no chest pain or SOB.   Will make NPO and follow H and H.  Type and screen and consult GI.  Will hold anti HTN meds and give IVF.  He is hemodynamically stable.   Reasonable at this time to give PPI drip.  No evidence of esophageal varices.   Hypokalemia:  Will give IV boluses of KCL, along with oral and IV fluid additive.  Follow K level q 12 hours.   HTN:  His SBP is 160.  Will hold anti HTN for now.  Resume once ascertained his bleeding stopped.  ;Hold ASA.    DVT prophylaxis: SCD.  Code Status: FULL CODE.  Family Communication: wife at bedside.  Disposition Plan: To home when appropriate.  Consults called: None. Admission status: OBS>    Kista Robb MD FACP. [facility]  If 7PM-7AM, please contact night-coverage [email or web address] Password [email or web address]  12/16/2016, 5:02 AM

## 2016-12-16 NOTE — Progress Notes (Signed)
Placed Dr. Darrick PennaFields on treatment team and called and left message.

## 2016-12-16 NOTE — ED Notes (Signed)
ICU called and asked this RN if I could give them time before getting report due to a death upstairs. Pt still needing care here in the ED.

## 2016-12-16 NOTE — ED Notes (Signed)
Called xray for stat xray.

## 2016-12-16 NOTE — ED Notes (Signed)
Called to ICU to see if the were ready to get report. The nurses are taking care of a pt who has passed away and they will call me back shortly.

## 2016-12-16 NOTE — Consult Note (Signed)
Referring Provider: Dr. Conley Rolls  Primary Care Physician:  Evlyn Courier, MD Primary Gastroenterologist:  Dr. Darrick Penna   Date of Admission: 12/15/16 Date of Consultation: 12/16/16   Reason for Consultation:  Hematemesis   HPI:  Scott Ritter is a 56 y.o. year old male who presented to the ED yesterday evening after several episodes of vomiting, resulting in bright red emesis He states that he ate a sandwich from [employer] yesterday and strawberry cheesecake. Around 6pm, he felt his stomach burning "all over" and then had several episodes of N/V, which brought relief. It wasn't until around 9 to 10 pm that evening that he felt he was going to heave again and noted "only bright red blood" filling the toilet. Denies NSAIDs. No chronic reflux. Feels much improved since admission and actually states he does not want to stay overnight. Significant hypokalemia on admission 2.6, s/p multiple rounds of KCL 10 mEq IV. This afternoon, potassium 2.7. Hgb 12 on admission and remaining stable. No further hematemesis since admission. NO melena. No prior EGD. Colonoscopy on file from last year. No known history of liver disease.   Past Medical History:  Diagnosis Date  . Hypertension   . Stroke [facility])     Past Surgical History:  Procedure Laterality Date  . BACK SURGERY    . COLONOSCOPY N/A 02/29/2016   Dr. Darrick Penna: redundant colon, non-bleeding internal hemorrhoids   . HERNIA REPAIR      Prior to Admission medications   Medication Sig Start Date End Date Taking? Authorizing Provider  amLODipine (NORVASC) 10 MG tablet Take 10 mg by mouth daily.   Yes Historical Provider, MD  aspirin EC 81 MG tablet Take 81 mg by mouth daily.   Yes Historical Provider, MD  atorvastatin (LIPITOR) 20 MG tablet Take 20 mg by mouth daily. 05/04/15  Yes Historical Provider, MD  losartan-hydrochlorothiazide (HYZAAR) 100-25 MG per tablet Take 1 tablet by mouth daily. 05/04/15  Yes Historical Provider, MD    Current  Facility-Administered Medications  Medication Dose Route Frequency Provider Last Rate Last Dose  . atorvastatin (LIPITOR) tablet 20 mg  20 mg Oral Daily Houston Siren, MD   20 mg at 12/16/16 1002  . dextrose 5 % and 0.9 % NaCl with KCl 20 mEq/L infusion   Intravenous Continuous Houston Siren, MD 100 mL/hr at 12/16/16 0808    . [START ON 12/17/2016] Influenza vac split quadrivalent PF (FLUARIX) injection 0.5 mL  0.5 mL Intramuscular Tomorrow-1000 Clanford L Johnson, MD      . ondansetron (ZOFRAN) tablet 4 mg  4 mg Oral Q6H PRN Houston Siren, MD       Or  . ondansetron [facility]) injection 4 mg  4 mg Intravenous Q6H PRN Houston Siren, MD      . pantoprazole (PROTONIX) 80 mg in sodium chloride 0.9 % 250 mL (0.32 mg/mL) infusion  8 mg/hr Intravenous Continuous Devoria Albe, MD 25 mL/hr at 12/16/16 1324 8 mg/hr at 12/16/16 1324  . potassium chloride 10 MEQ/100ML IVPB           . sodium chloride flush (NS) 0.9 % injection 3 mL  3 mL Intravenous Q12H Houston Siren, MD   3 mL at 12/16/16 1007    Allergies as of 12/15/2016  . (No Known Allergies)    Family History  Problem Relation Age of Onset  . Hypertension Father   . Colon cancer Neg Hx     Social History   Social History  . Marital status: Married  Spouse name: N/A  . Number of children: N/A  . Years of education: N/A   Occupational History  . Not on file.   Social History Main Topics  . Smoking status: Never Smoker  . Smokeless tobacco: Never Used  . Alcohol use Yes     Comment: occasionally  . Drug use: No  . Sexual activity: Not on file   Other Topics Concern  . Not on file   Social History Narrative  . No narrative on file    Review of Systems: Gen: Denies fever, chills, loss of appetite, change in weight or weight loss CV: Denies chest pain, heart palpitations, syncope, edema  Resp: Denies shortness of breath with rest, cough, wheezing GI: see HPI  GU : Denies urinary burning, urinary frequency, urinary incontinence.  MS: Denies joint  pain,swelling, cramping Derm: Denies rash, itching, dry skin Psych: Denies depression, anxiety,confusion, or memory loss Heme: see HPI   Physical Exam: Vital signs in last 24 hours: Temp:  [98.1 F (36.7 C)-98.3 F (36.8 C)] 98.1 F (36.7 C) (03/06 1529) Pulse Rate:  [58-67] 67 (03/06 1300) Resp:  [12-21] 20 (03/06 1300) BP: (142-167)/(73-97) 142/73 (03/06 1000) SpO2:  [98 %-100 %] 100 % (03/06 1300) Weight:  [203 lb 11.3 oz (92.4 kg)-212 lb (96.2 kg)] 203 lb 11.3 oz (92.4 kg) (03/06 0736) Last BM Date: 12/14/16 General:   Alert,  Well-developed, well-nourished, pleasant and cooperative in NAD Head:  Normocephalic and atraumatic. Eyes:  Sclera clear, no icterus.   Conjunctiva pink. Ears:  Normal auditory acuity. Nose:  No deformity, discharge,  or lesions. Mouth:  No deformity or lesions Lungs:  Clear throughout to auscultation.   No wheezes, crackles, or rhonchi. No acute distress. Heart:  Slight bradycardia, occasional PVCs; no murmurs, clicks, rubs,  or gallops. Abdomen:  Soft, nontender and nondistended. No masses, hepatosplenomegaly or hernias noted. Normal bowel sounds, without guarding, and without rebound.   Rectal:  Deferred  Msk:  Symmetrical without gross deformities. Normal posture. Extremities:  Without edema. Neurologic:  Alert and  oriented x4 Psych:  Alert and cooperative. Normal mood and affect.  Intake/Output from previous day: No intake/output data recorded. Intake/Output this shift: Total I/O In: 155.8 [I.V.:155.8] Out: 1325 [Urine:1325]  Lab Results:  Recent Labs  12/15/16 2351 12/16/16 0902 12/16/16 1322  WBC 12.2* 10.4 8.7  HGB 12.0* 12.9* 12.2*  HCT 35.7* 39.0 36.1*  PLT 318 340 298   BMET  Recent Labs  12/15/16 2351 12/16/16 1322  NA 140 138  K 2.6* 2.7*  CL 102 103  CO2 29 27  GLUCOSE 106* 97  BUN 17 10  CREATININE 1.14 0.87  CALCIUM 9.1 8.7*   LFT  Recent Labs  12/15/16 2351  PROT 8.0  ALBUMIN 4.1  AST 25  ALT 14*   ALKPHOS 89  BILITOT 0.6    Studies/Results: Dg Chest 2 View  Result Date: 12/16/2016 CLINICAL DATA:  Hematemesis. EXAM: CHEST  2 VIEW COMPARISON:  10/19/2008 FINDINGS: The heart size and mediastinal contours are within normal limits. Both lungs are clear. The visualized skeletal structures are unremarkable. IMPRESSION: No active cardiopulmonary disease. Electronically Signed   By: Ellery Plunk M.D.   On: 12/16/2016 05:19    Impression: 56 year old male admitted with hematemesis after multiple rounds of vomiting. No further overt GI bleeding since admission, Hgb remaining stable since admission. Significant hypokalemia continues to be an issue, requiring IV replacement. Likely MW tear in setting of repetitive vomiting. Discussed electrolyte replacement with tentative  EGD tomorrow with Dr. Darrick PennaFields; however, patient is quite eager to go home and even spoke of leaving against our advice. He continued to question how many more rounds of potassium he would need. I discussed the risks of leaving without diagnostic evaluation of hematemesis and also the dangers of not replacing potassium. I relayed patient's eagerness to go home with nurse.   Plan: Potassium replacement Recommend EGD while inpatient if patient is willing IV Protonix Will evaluate again in the morning   Gelene MinkAnna W. Kyran Whittier, ANP-BC [facility]     LOS: 0 days    12/16/2016, 4:02 PM

## 2016-12-17 ENCOUNTER — Encounter (HOSPITAL_COMMUNITY): Payer: Self-pay | Admitting: *Deleted

## 2016-12-17 ENCOUNTER — Encounter (HOSPITAL_COMMUNITY)
Admission: EM | Disposition: A | Discharge status: Home / Self Care | Payer: Self-pay | Source: Home / Self Care | Attending: Emergency Medicine

## 2016-12-17 DIAGNOSIS — I10 Essential (primary) hypertension: Secondary | ICD-10-CM | POA: Diagnosis not present

## 2016-12-17 DIAGNOSIS — K920 Hematemesis: Secondary | ICD-10-CM

## 2016-12-17 DIAGNOSIS — K226 Gastro-esophageal laceration-hemorrhage syndrome: Secondary | ICD-10-CM | POA: Diagnosis not present

## 2016-12-17 DIAGNOSIS — E876 Hypokalemia: Secondary | ICD-10-CM | POA: Diagnosis not present

## 2016-12-17 HISTORY — PX: ESOPHAGOGASTRODUODENOSCOPY: SHX5428

## 2016-12-17 LAB — COMPREHENSIVE METABOLIC PANEL
ALBUMIN: 3.6 g/dL (ref 3.5–5.0)
ALT: 14 U/L — ABNORMAL LOW (ref 17–63)
AST: 23 U/L (ref 15–41)
Alkaline Phosphatase: 81 U/L (ref 38–126)
Anion gap: 7 (ref 5–15)
BUN: 7 mg/dL (ref 6–20)
CHLORIDE: 104 mmol/L (ref 101–111)
CO2: 25 mmol/L (ref 22–32)
Calcium: 8.4 mg/dL — ABNORMAL LOW (ref 8.9–10.3)
Creatinine, Ser: 0.94 mg/dL (ref 0.61–1.24)
GFR calc Af Amer: >60 mL/min (ref >60)
GFR calc non Af Amer: >60 mL/min (ref >60)
GLUCOSE: 98 mg/dL (ref 65–99)
POTASSIUM: 2.8 mmol/L — AB (ref 3.5–5.1)
Sodium: 136 mmol/L (ref 135–145)
Total Bilirubin: 0.6 mg/dL (ref 0.3–1.2)
Total Protein: 7.2 g/dL (ref 6.5–8.1)

## 2016-12-17 LAB — CBC
HEMATOCRIT: 35 % — AB (ref 39.0–52.0)
Hemoglobin: 11.6 g/dL — ABNORMAL LOW (ref 13.0–17.0)
MCH: 27.8 pg (ref 26.0–34.0)
MCHC: 33.1 g/dL (ref 30.0–36.0)
MCV: 83.9 fL (ref 78.0–100.0)
Platelets: 287 10*3/uL (ref 150–400)
RBC: 4.17 MIL/uL — ABNORMAL LOW (ref 4.22–5.81)
RDW: 14.6 % (ref 11.5–15.5)
WBC: 9.3 10*3/uL (ref 4.0–10.5)

## 2016-12-17 LAB — POTASSIUM: Potassium: 3.1 mmol/L — ABNORMAL LOW (ref 3.5–5.1)

## 2016-12-17 LAB — MAGNESIUM: Magnesium: 1.7 mg/dL (ref 1.7–2.4)

## 2016-12-17 LAB — HIV ANTIBODY (ROUTINE TESTING W REFLEX): HIV Screen 4th Generation wRfx: NONREACTIVE

## 2016-12-17 SURGERY — EGD (ESOPHAGOGASTRODUODENOSCOPY)
Anesthesia: Moderate Sedation

## 2016-12-17 MED ORDER — POTASSIUM CHLORIDE CRYS ER 20 MEQ PO TBCR: 20.0000 meq | EXTENDED_RELEASE_TABLET | Freq: Every day | ORAL | 0 refills | Status: DC

## 2016-12-17 MED ORDER — ASPIRIN EC 81 MG PO TBEC: 81.0000 mg | DELAYED_RELEASE_TABLET | Freq: Every day | ORAL | Status: DC

## 2016-12-17 MED ORDER — PANTOPRAZOLE SODIUM 40 MG PO TBEC: 40.0000 mg | DELAYED_RELEASE_TABLET | Freq: Every day | ORAL | 1 refills | Status: DC

## 2016-12-17 MED ORDER — SODIUM CHLORIDE 0.9 % IV SOLN
INTRAVENOUS | Status: DC
  Administered 2016-12-17: 1000 mL via INTRAVENOUS

## 2016-12-17 MED ORDER — LIDOCAINE VISCOUS 2 % MT SOLN
OROMUCOSAL | Status: AC
  Administered 2016-12-17: 16:00:00
  Filled 2016-12-17: qty 15

## 2016-12-17 MED ORDER — POTASSIUM CHLORIDE 10 MEQ/100ML IV SOLN
10.0000 meq | INTRAVENOUS | Status: AC
  Administered 2016-12-17 (×3): 10 meq via INTRAVENOUS
  Filled 2016-12-17 (×3): qty 100

## 2016-12-17 MED ORDER — MEPERIDINE HCL 100 MG/ML IJ SOLN
INTRAMUSCULAR | Status: DC | PRN
  Administered 2016-12-17 (×2): 25 mg via INTRAVENOUS

## 2016-12-17 MED ORDER — POTASSIUM CHLORIDE CRYS ER 20 MEQ PO TBCR
40.0000 meq | EXTENDED_RELEASE_TABLET | Freq: Once | ORAL | Status: AC
  Administered 2016-12-17: 40 meq via ORAL
  Filled 2016-12-17: qty 2

## 2016-12-17 MED ORDER — MIDAZOLAM HCL 5 MG/5ML IJ SOLN
INTRAMUSCULAR | Status: AC
  Administered 2016-12-17: 16:00:00
  Filled 2016-12-17: qty 10

## 2016-12-17 MED ORDER — PANTOPRAZOLE SODIUM 40 MG PO TBEC
40.0000 mg | DELAYED_RELEASE_TABLET | Freq: Two times a day (BID) | ORAL | Status: DC
  Administered 2016-12-17: 40 mg via ORAL
  Filled 2016-12-17: qty 1

## 2016-12-17 MED ORDER — MEPERIDINE HCL 100 MG/ML IJ SOLN
INTRAMUSCULAR | Status: AC
  Administered 2016-12-17: 16:00:00
  Filled 2016-12-17: qty 2

## 2016-12-17 MED ORDER — LIDOCAINE VISCOUS 2 % MT SOLN
OROMUCOSAL | Status: DC | PRN
  Administered 2016-12-17: 1 via OROMUCOSAL

## 2016-12-17 MED ORDER — MIDAZOLAM HCL 5 MG/5ML IJ SOLN
INTRAMUSCULAR | Status: DC | PRN
  Administered 2016-12-17: 1 mg via INTRAVENOUS
  Administered 2016-12-17 (×2): 2 mg via INTRAVENOUS

## 2016-12-17 NOTE — Op Note (Signed)
[facility] Patient Name: Scott Ritter Procedure Date: 12/17/2016 2:58 PM MRN: [id number] Date of Birth: [birth year] Attending MD: Jonette EvaSandi Talon Regala , MD CSN: [id number] Age: 56 Admit Type: Outpatient Procedure:                Upper GI endoscopy, DIAGNOSTIC Indications:              Hematemesis Providers:                Jonette EvaSandi Huxton Glaus, MD, Nena PolioLisa Moore, RN, Edrick Kinsammy Vaught, RN Referring MD:             Annia FriendlyGerald K. Hill MD, MD Medicines:                Meperidine 50 mg IV, Midazolam 5 mg IV Complications:            No immediate complications. Estimated Blood Loss:     Estimated blood loss: none. Procedure:                Pre-Anesthesia Assessment:                           - Prior to the procedure, a History and Physical                            was performed, and patient medications and                            allergies were reviewed. The patient's tolerance of                            previous anesthesia was also reviewed. The risks                            and benefits of the procedure and the sedation                            options and risks were discussed with the patient.                            All questions were answered, and informed consent                            was obtained. Prior Anticoagulants: The patient has                            taken aspirin, last dose was 2 days prior to                            procedure. ASA Grade Assessment: II - A patient                            with mild systemic disease. After reviewing the                            risks and benefits, the patient was deemed in  satisfactory condition to undergo the procedure.                            After obtaining informed consent, the endoscope was                            passed under direct vision. Throughout the                            procedure, the patient's blood pressure, pulse, and                            oxygen saturations were monitored  continuously. The                            [id number] ([id number]) scope was introduced through the                            mouth, and advanced to the second part of duodenum.                            The upper GI endoscopy was accomplished without                            difficulty. The patient tolerated the procedure                            well. Scope In: 3:14:24 PM Scope Out: 3:19:39 PM Total Procedure Duration: 0 hours 5 minutes 15 seconds  Findings:      A 3 mm non-bleeding Mallory-Weiss tear with no stigmata of recent       bleeding was found.      No gross lesions were noted in the stomach.      The examined duodenum was normal. Impression:               - HEMATEMESIS DUE TO Mallory-Weiss tear. Moderate Sedation:      Moderate (conscious) sedation was administered by the endoscopy nurse       and supervised by the endoscopist. The following parameters were       monitored: oxygen saturation, heart rate, blood pressure, and response       to care. Total physician intraservice time was 13 minutes. Recommendation:           - Return patient to hospital ward.                           - Cardiac diet.                           - Continue present medications.                           - Use Protonix (pantoprazole) 40 mg PO daily TO                            PREVENT GASTRITIS/PUD WHILE TAKING ASA. Procedure Code(s):        ---  Professional ---                           [phone], Esophagogastroduodenoscopy, flexible,                            transoral; diagnostic, including collection of                            specimen(s) by brushing or washing, when performed                            (separate procedure)                           99152, Moderate sedation services provided by the                            same physician or other qualified health care                            professional performing the diagnostic or                            therapeutic service that  the sedation supports,                            requiring the presence of an independent trained                            observer to assist in the monitoring of the                            patient's level of consciousness and physiological                            status; initial 15 minutes of intraservice time,                            patient age 54 years or older Diagnosis Code(s):        --- Professional ---                           K22.6, Gastro-esophageal laceration-hemorrhage                            syndrome                           K92.0, Hematemesis CPT copyright 2016 American [employer] Association. All rights reserved. The codes documented in this report are preliminary and upon coder review may  be revised to meet current compliance requirements. Jonette Eva, MD Jonette Eva, MD 12/17/2016 3:28:08 PM This report has been signed electronically. Number of Addenda: 0

## 2016-12-17 NOTE — Progress Notes (Signed)
REVIEWED-NO ADDITIONAL RECOMMENDATIONS-SLF  Patient for EGD today. Potassium 2.8. Discussed with Dr. Darrick Pennafields. Plan for KCl 40 mEq orally 1 now. KCl 10 mEq IV Q hour 3.  Leanna BattlesLeslie S. Dixon BoosLewis, PA-C [facility] [phone] 3/7/20188:17 AM

## 2016-12-17 NOTE — H&P (Signed)
Primary Care Physician:  Evlyn CourierGerald K Hill, MD Primary Gastroenterologist:  Dr. Darrick PennaFields  Pre-Procedure History & Physical: HPI:  Scott Ritter is a 56 y.o. male here for HEMATEMESIS.  Past Medical History:  Diagnosis Date  . Hypertension   . Stroke [facility](H[facility])     Past Surgical History:  Procedure Laterality Date  . BACK SURGERY    . COLONOSCOPY N/A 02/29/2016   Dr. Darrick PennaFields: redundant colon, non-bleeding internal hemorrhoids   . HERNIA REPAIR      Prior to Admission medications   Medication Sig Start Date End Date Taking? Authorizing Provider  amLODipine (NORVASC) 10 MG tablet Take 10 mg by mouth daily.   Yes Historical Provider, MD  aspirin EC 81 MG tablet Take 81 mg by mouth daily.   Yes Historical Provider, MD  atorvastatin (LIPITOR) 20 MG tablet Take 20 mg by mouth daily. 05/04/15  Yes Historical Provider, MD  losartan-hydrochlorothiazide (HYZAAR) 100-25 MG per tablet Take 1 tablet by mouth daily. 05/04/15  Yes Historical Provider, MD    Allergies as of 12/15/2016  . (No Known Allergies)    Family History  Problem Relation Age of Onset  . Hypertension Father   . Colon cancer Neg Hx     Social History   Social History  . Marital status: Married    Spouse name: N/A  . Number of children: N/A  . Years of education: N/A   Occupational History  . Not on file.   Social History Main Topics  . Smoking status: Never Smoker  . Smokeless tobacco: Never Used  . Alcohol use Yes     Comment: occasionally  . Drug use: No  . Sexual activity: Not on file   Other Topics Concern  . Not on file   Social History Narrative  . No narrative on file    Review of Systems: See HPI, otherwise negative ROS   Physical Exam: BP (!) 176/93   Pulse 68   Temp 99.2 F (37.3 C) (Oral)   Resp 14   Ht 5\' 7"  (1.702 m)   Wt 201 lb 12.6 oz (91.5 kg)   SpO2 100%   BMI 31.60 kg/m  General:   Alert,  pleasant and cooperative in NAD Head:  Normocephalic and atraumatic. Neck:   Supple; Lungs:  Clear throughout to auscultation.    Heart:  Regular rate and rhythm. Abdomen:  Soft, nontender and nondistended. Normal bowel sounds, without guarding, and without rebound.   Neurologic:  Alert and  oriented x4;  grossly normal neurologically.  Impression/Plan:     HEMATEMESIS  PLAN:  EGD TODAY. DISCUSSED PROCEDURE, BENEFITS, & RISKS: < 1% chance of medication reaction, bleeding, or perforation.

## 2016-12-17 NOTE — Discharge Summary (Signed)
Physician Discharge Summary  Scott Ritter ZOX:[id number] DOB: [birth year] DOA: 12/16/2016  PCP: Evlyn Courier, MD  Admit date: 12/16/2016 Discharge date: 12/17/2016  Admitted From: home Disposition:  home  Recommendations for Outpatient Follow-up:  1. Follow up with PCP in 1-2 weeks 2. Please obtain BMP/CBC in one week 3. Follow up with GI as needed  Home Health: Equipment/Devices:  Discharge Condition: stable CODE STATUS: full Diet recommendation: Heart Healthy   Brief/Interim Summary: Scott Ritter is an 56 y.o. male with hx of HTN on Norvasc and Zesteretic, hx of prior CVA, a rare beer drinker, presented to the ER with hematemesis.  He had been nauseated earlier today, with no abdominal pain, and vomited food earlier today, had some retching, then a few hours later, he vomited bright red blood.  He denied dark or old blood, and had no melena.  He denied lightheadedness or diaphoresis.  In the ER, his SBP was 160, and his Hb was 12 g per dL, with BUN of 17.  He was found to have a K of 2.6, and his Cr was normal. His LFT and Lipase were normal.  Hospitalist was asked to admit him for severe hypokalemia, diuretic induced, and hematemesis, likely from a MW tear.     Discharge Diagnoses:  Principal Problem:   Mallory-Weiss tear Active Problems:   Hematemesis   HTN (hypertension)   Hypokalemia   Acute upper GI bleed  Patient was admitted to The hospital with repeated episodes of vomiting and hematemesis. He underwent EGD which revealed Mallory-Weiss tear. Hemoglobin remained relatively stable and did not require transfusion of PRBCs. GI is recommended Protonix to be taken while he is on aspirin. He did not have any further episodes of hematemesis since being admitted to the hospital.  He was noted to be significantly hypokalemic, also noted to be on hydrochlorothiazide as an outpatient. Potassium has been repleted and will continue on daily potassium supplement. He will need  repeat chemistry to be drawn in one week.  Discharge Instructions  Discharge Instructions    Diet - low sodium heart healthy    Complete by:  As directed    Increase activity slowly    Complete by:  As directed      Allergies as of 12/17/2016   No Known Allergies     Medication List    TAKE these medications   amLODipine 10 MG tablet Commonly known as:  NORVASC Take 10 mg by mouth daily.   aspirin EC 81 MG tablet Take 1 tablet (81 mg total) by mouth daily. Restart in 5 days What changed:  additional instructions   atorvastatin 20 MG tablet Commonly known as:  LIPITOR Take 20 mg by mouth daily.   losartan-hydrochlorothiazide 100-25 MG tablet Commonly known as:  HYZAAR Take 1 tablet by mouth daily.   pantoprazole 40 MG tablet Commonly known as:  PROTONIX Take 1 tablet (40 mg total) by mouth daily.   potassium chloride SA 20 MEQ tablet Commonly known as:  K-DUR,KLOR-CON Take 1 tablet (20 mEq total) by mouth daily.      Follow-up Information    Jonette Eva, MD Follow up.   Specialty:  Gastroenterology Why:  follow up as needed  Contact information: [address] [place] [address] [phone]        Evlyn Courier, MD. Schedule an appointment as soon as possible for a visit on 12/20/2016.   Specialty:  Family Medicine Why:  Appointment with Dr. Erma Pinto. March 10th  at 11:15am Contact information: [address] [place] [address] [phone]          No Known Allergies  Consultations:  GI   Procedures/Studies: Dg Chest 2 View  Result Date: 12/16/2016 CLINICAL DATA:  Hematemesis. EXAM: CHEST  2 VIEW COMPARISON:  10/19/2008 FINDINGS: The heart size and mediastinal contours are within normal limits. Both lungs are clear. The visualized skeletal structures are unremarkable. IMPRESSION: No active cardiopulmonary disease. Electronically Signed   By: Ellery Plunk M.D.   On: 12/16/2016 05:19    Echo: - HEMATEMESIS DUE TO  Mallory-Weiss tear.   Subjective: No further vomiting or hematemesis  Discharge Exam: Vitals:   12/17/16 1540 12/17/16 1600  BP: 123/68 (!) 149/82  Pulse: 62 62  Resp: 14 15  Temp:  97.7 F (36.5 C)   Vitals:   12/17/16 1530 12/17/16 1535 12/17/16 1540 12/17/16 1600  BP: 122/82 107/65 123/68 (!) 149/82  Pulse: 65 64 62 62  Resp: 15 14 14 15   Temp:    97.7 F (36.5 C)  TempSrc:    Oral  SpO2: 100% 100% 100% 99%  Weight:      Height:        General: Pt is alert, awake, not in acute distress Cardiovascular: RRR, S1/S2 +, no rubs, no gallops Respiratory: CTA bilaterally, no wheezing, no rhonchi Abdominal: Soft, NT, ND, bowel sounds + Extremities: no edema, no cyanosis    The results of significant diagnostics from this hospitalization (including imaging, microbiology, ancillary and laboratory) are listed below for reference.     Microbiology: Recent Results (from the past 240 hour(s))  MRSA PCR Screening     Status: None   Collection Time: 12/16/16  7:21 AM  Result Value Ref Range Status   MRSA by PCR NEGATIVE NEGATIVE Final    Comment:        The GeneXpert MRSA Assay (FDA approved for NASAL specimens only), is one component of a comprehensive MRSA colonization surveillance program. It is not intended to diagnose MRSA infection nor to guide or monitor treatment for MRSA infections.      Labs: BNP (last 3 results) No results for input(s): BNP in the last 8760 hours. Basic Metabolic Panel:  Recent Labs Lab 12/15/16 2351 12/16/16 1322 12/17/16 0100 12/17/16 1542  NA 140 138 136  --   K 2.6* 2.7* 2.8* 3.1*  CL 102 103 104  --   CO2 29 27 25   --   GLUCOSE 106* 97 98  --   BUN 17 10 7   --   CREATININE 1.14 0.87 0.94  --   CALCIUM 9.1 8.7* 8.4*  --   MG  --   --  1.7  --    Liver Function Tests:  Recent Labs Lab 12/15/16 2351 12/17/16 0100  AST 25 23  ALT 14* 14*  ALKPHOS 89 81  BILITOT 0.6 0.6  PROT 8.0 7.2  ALBUMIN 4.1 3.6    Recent  Labs Lab 12/15/16 2351  LIPASE 16   No results for input(s): AMMONIA in the last 168 hours. CBC:  Recent Labs Lab 12/15/16 2351 12/16/16 0902 12/16/16 1322 12/16/16 1958 12/17/16 0100  WBC 12.2* 10.4 8.7 9.6 9.3  HGB 12.0* 12.9* 12.2* 12.0* 11.6*  HCT 35.7* 39.0 36.1* 35.8* 35.0*  MCV 83.8 84.4 83.8 83.6 83.9  PLT 318 340 298 326 287   Cardiac Enzymes: No results for input(s): CKTOTAL, CKMB, CKMBINDEX, TROPONINI in the last 168 hours. BNP: Invalid input(s):  POCBNP CBG: No results for input(s): GLUCAP in the last 168 hours. D-Dimer No results for input(s): DDIMER in the last 72 hours. Hgb A1c No results for input(s): HGBA1C in the last 72 hours. Lipid Profile No results for input(s): CHOL, HDL, LDLCALC, TRIG, CHOLHDL, LDLDIRECT in the last 72 hours. Thyroid function studies No results for input(s): TSH, T4TOTAL, T3FREE, THYROIDAB in the last 72 hours.  Invalid input(s): FREET3 Anemia work up No results for input(s): VITAMINB12, FOLATE, FERRITIN, TIBC, IRON, RETICCTPCT in the last 72 hours. Urinalysis    Component Value Date/Time   COLORURINE YELLOW 12/16/2016 0330   APPEARANCEUR CLEAR 12/16/2016 0330   LABSPEC 1.020 12/16/2016 0330   PHURINE 6.0 12/16/2016 0330   GLUCOSEU NEGATIVE 12/16/2016 0330   HGBUR NEGATIVE 12/16/2016 0330   BILIRUBINUR NEGATIVE 12/16/2016 0330   KETONESUR NEGATIVE 12/16/2016 0330   PROTEINUR 30 (A) 12/16/2016 0330   UROBILINOGEN 1.0 11/01/2008 1425   NITRITE NEGATIVE 12/16/2016 0330   LEUKOCYTESUR NEGATIVE 12/16/2016 0330   Sepsis Labs Invalid input(s): PROCALCITONIN,  WBC,  LACTICIDVEN Microbiology Recent Results (from the past 240 hour(s))  MRSA PCR Screening     Status: None   Collection Time: 12/16/16  7:21 AM  Result Value Ref Range Status   MRSA by PCR NEGATIVE NEGATIVE Final    Comment:        The GeneXpert MRSA Assay (FDA approved for NASAL specimens only), is one component of a comprehensive MRSA  colonization surveillance program. It is not intended to diagnose MRSA infection nor to guide or monitor treatment for MRSA infections.      Time coordinating discharge: Over 30 minutes  SIGNED:   Erick BlinksMEMON,JEHANZEB, MD  [facility] 12/17/2016, 5:36 PM Pager   If 7PM-7AM, please contact night-coverage [email or web address] Password [email or web address]

## 2016-12-17 NOTE — Progress Notes (Signed)
Pt IV and telemetry removed, tolerated well.  Reviewed discharge instructions with pt and answered all questions at this time. 

## 2016-12-19 ENCOUNTER — Encounter (HOSPITAL_COMMUNITY): Payer: Self-pay | Admitting: Gastroenterology

## 2017-01-12 DIAGNOSIS — I6930 Unspecified sequelae of cerebral infarction: Secondary | ICD-10-CM | POA: Diagnosis not present

## 2017-01-12 DIAGNOSIS — E784 Other hyperlipidemia: Secondary | ICD-10-CM | POA: Diagnosis not present

## 2017-01-12 DIAGNOSIS — E559 Vitamin D deficiency, unspecified: Secondary | ICD-10-CM | POA: Diagnosis not present

## 2017-01-12 DIAGNOSIS — Z125 Encounter for screening for malignant neoplasm of prostate: Secondary | ICD-10-CM | POA: Diagnosis not present

## 2017-01-12 DIAGNOSIS — I10 Essential (primary) hypertension: Secondary | ICD-10-CM | POA: Diagnosis not present

## 2017-02-02 DIAGNOSIS — E876 Hypokalemia: Secondary | ICD-10-CM | POA: Diagnosis not present

## 2017-02-02 DIAGNOSIS — E784 Other hyperlipidemia: Secondary | ICD-10-CM | POA: Diagnosis not present

## 2017-02-02 DIAGNOSIS — I10 Essential (primary) hypertension: Secondary | ICD-10-CM | POA: Diagnosis not present

## 2017-02-02 DIAGNOSIS — E559 Vitamin D deficiency, unspecified: Secondary | ICD-10-CM | POA: Diagnosis not present

## 2017-03-16 DIAGNOSIS — E784 Other hyperlipidemia: Secondary | ICD-10-CM | POA: Diagnosis not present

## 2017-03-16 DIAGNOSIS — E876 Hypokalemia: Secondary | ICD-10-CM | POA: Diagnosis not present

## 2017-03-16 DIAGNOSIS — Z713 Dietary counseling and surveillance: Secondary | ICD-10-CM | POA: Diagnosis not present

## 2017-03-16 DIAGNOSIS — I10 Essential (primary) hypertension: Secondary | ICD-10-CM | POA: Diagnosis not present

## 2017-03-16 DIAGNOSIS — E559 Vitamin D deficiency, unspecified: Secondary | ICD-10-CM | POA: Diagnosis not present

## 2017-06-23 DIAGNOSIS — E784 Other hyperlipidemia: Secondary | ICD-10-CM | POA: Diagnosis not present

## 2017-06-23 DIAGNOSIS — I10 Essential (primary) hypertension: Secondary | ICD-10-CM | POA: Diagnosis not present

## 2017-06-23 DIAGNOSIS — E559 Vitamin D deficiency, unspecified: Secondary | ICD-10-CM | POA: Diagnosis not present

## 2017-06-23 DIAGNOSIS — M79673 Pain in unspecified foot: Secondary | ICD-10-CM | POA: Diagnosis not present

## 2017-06-23 DIAGNOSIS — E876 Hypokalemia: Secondary | ICD-10-CM | POA: Diagnosis not present

## 2017-07-16 DIAGNOSIS — I10 Essential (primary) hypertension: Secondary | ICD-10-CM | POA: Diagnosis not present

## 2017-07-16 DIAGNOSIS — E876 Hypokalemia: Secondary | ICD-10-CM | POA: Diagnosis not present

## 2017-07-17 DIAGNOSIS — M71572 Other bursitis, not elsewhere classified, left ankle and foot: Secondary | ICD-10-CM | POA: Diagnosis not present

## 2017-07-17 DIAGNOSIS — M76822 Posterior tibial tendinitis, left leg: Secondary | ICD-10-CM | POA: Diagnosis not present

## 2017-07-17 DIAGNOSIS — M19072 Primary osteoarthritis, left ankle and foot: Secondary | ICD-10-CM | POA: Diagnosis not present

## 2017-10-14 DIAGNOSIS — I6930 Unspecified sequelae of cerebral infarction: Secondary | ICD-10-CM | POA: Diagnosis not present

## 2017-10-14 DIAGNOSIS — E7849 Other hyperlipidemia: Secondary | ICD-10-CM | POA: Diagnosis not present

## 2017-10-20 DIAGNOSIS — E559 Vitamin D deficiency, unspecified: Secondary | ICD-10-CM | POA: Diagnosis not present

## 2017-10-20 DIAGNOSIS — E7849 Other hyperlipidemia: Secondary | ICD-10-CM | POA: Diagnosis not present

## 2017-10-20 DIAGNOSIS — I10 Essential (primary) hypertension: Secondary | ICD-10-CM | POA: Diagnosis not present

## 2017-10-20 DIAGNOSIS — Z23 Encounter for immunization: Secondary | ICD-10-CM | POA: Diagnosis not present

## 2017-11-28 ENCOUNTER — Emergency Department (HOSPITAL_COMMUNITY)
Admission: EM | Admit: 2017-11-28 | Discharge: 2017-11-28 | Disposition: A | Discharge status: Home / Self Care | Payer: Medicare HMO | Attending: Emergency Medicine | Admitting: Emergency Medicine

## 2017-11-28 ENCOUNTER — Emergency Department (HOSPITAL_COMMUNITY): Payer: Medicare HMO

## 2017-11-28 ENCOUNTER — Other Ambulatory Visit: Payer: Self-pay

## 2017-11-28 ENCOUNTER — Encounter (HOSPITAL_COMMUNITY): Payer: Self-pay | Admitting: Emergency Medicine

## 2017-11-28 DIAGNOSIS — Z7982 Long term (current) use of aspirin: Secondary | ICD-10-CM | POA: Insufficient documentation

## 2017-11-28 DIAGNOSIS — Z79899 Other long term (current) drug therapy: Secondary | ICD-10-CM | POA: Insufficient documentation

## 2017-11-28 DIAGNOSIS — I10 Essential (primary) hypertension: Secondary | ICD-10-CM | POA: Insufficient documentation

## 2017-11-28 DIAGNOSIS — S8992XA Unspecified injury of left lower leg, initial encounter: Secondary | ICD-10-CM | POA: Diagnosis not present

## 2017-11-28 DIAGNOSIS — M25562 Pain in left knee: Secondary | ICD-10-CM | POA: Diagnosis not present

## 2017-11-28 DIAGNOSIS — M79672 Pain in left foot: Secondary | ICD-10-CM | POA: Diagnosis not present

## 2017-11-28 DIAGNOSIS — S99922A Unspecified injury of left foot, initial encounter: Secondary | ICD-10-CM | POA: Diagnosis not present

## 2017-11-28 DIAGNOSIS — S99912A Unspecified injury of left ankle, initial encounter: Secondary | ICD-10-CM | POA: Diagnosis not present

## 2017-11-28 NOTE — ED Provider Notes (Addendum)
[facility] EMERGENCY DEPARTMENT Provider Note   CSN: [id number] Arrival date & time: 11/28/17  7829     History   Chief Complaint Chief Complaint  Patient presents with  . Foot Pain    left    HPI Scott Ritter is a 57 y.o. male.  HPI  Patient slipped on steps and twisted left foot striking it on the stairs last night.  He did not fall to the ground and has no other injuries.  He has been walking on his foot with his cane without difficulty.  Pain is moderate.  He has taken nothing for the pain.  It is worsened by walking.  It is relieved by walking with his cane or staying off of his foot.  Past Medical History:  Diagnosis Date  . Hypertension   . Stroke [facility])     Patient Active Problem List   Diagnosis Date Noted  . Mallory-Weiss tear 12/16/2016  . Hematemesis 12/16/2016  . HTN (hypertension) 12/16/2016  . Hypokalemia 12/16/2016  . Acute upper GI bleed   . Special screening for malignant neoplasms, colon     Past Surgical History:  Procedure Laterality Date  . BACK SURGERY    . COLONOSCOPY N/A 02/29/2016   Dr. Darrick Penna: redundant colon, non-bleeding internal hemorrhoids   . ESOPHAGOGASTRODUODENOSCOPY N/A 12/17/2016   Procedure: ESOPHAGOGASTRODUODENOSCOPY (EGD);  Surgeon: West Bali, MD;  Location: [facility];  Service: Endoscopy;  Laterality: N/A;  . HERNIA REPAIR         Home Medications    Prior to Admission medications   Medication Sig Start Date End Date Taking? Authorizing Provider  amLODipine (NORVASC) 10 MG tablet Take 10 mg by mouth daily.    [provider]  aspirin EC 81 MG tablet Take 1 tablet (81 mg total) by mouth daily. Restart in 5 days 12/17/16   Erick Blinks, MD  atorvastatin (LIPITOR) 20 MG tablet Take 20 mg by mouth daily. 05/04/15   [provider]  hydrALAZINE (APRESOLINE) 25 MG tablet Take 1 tablet by mouth daily. 10/22/17   [provider]  losartan (COZAAR) 100 MG tablet Take 1 tablet by mouth  daily. 10/22/17   [provider]  losartan-hydrochlorothiazide (HYZAAR) 100-25 MG per tablet Take 1 tablet by mouth daily. 05/04/15   [provider]  pantoprazole (PROTONIX) 40 MG tablet Take 1 tablet (40 mg total) by mouth daily. 12/17/16   Erick Blinks, MD  potassium chloride SA (K-DUR,KLOR-CON) 20 MEQ tablet Take 1 tablet (20 mEq total) by mouth daily. 12/17/16   Erick Blinks, MD    Family History Family History  Problem Relation Age of Onset  . Hypertension Father   . Colon cancer Neg Hx     Social History Social History   Tobacco Use  . Smoking status: Never Smoker  . Smokeless tobacco: Never Used  Substance Use Topics  . Alcohol use: Yes    Comment: occasionally  . Drug use: No     Allergies   Patient has no known allergies.   Review of Systems Review of Systems  All other systems reviewed and are negative.    Physical Exam Updated Vital Signs BP (!) 168/86 (BP Location: Right Arm)   Pulse 65   Temp 98.6 F (37 C) (Oral)   Resp 18   Ht 1.702 m (5\' 7" )   Wt 94.3 kg (208 lb)   SpO2 100%   BMI 32.58 kg/m   Physical Exam  Constitutional: He is oriented  to person, place, and time. He appears well-developed and well-nourished.  HENT:  Head: Normocephalic and atraumatic.  Eyes: Pupils are equal, round, and reactive to light.  Neck: Normal range of motion.  Cardiovascular: Normal rate and regular rhythm.  Pulmonary/Chest: Effort normal.  Musculoskeletal: Normal range of motion.  Left hand contracted- prior stroke Left foot ttp dorsum No ttp left ankle mild ttp left lower knee Full arom Skin  Pink and intact Pulses intact  Neurological: He is alert and oriented to person, place, and time.  Skin: Skin is warm. Capillary refill takes less than 2 seconds.  Psychiatric: He has a normal mood and affect.  Nursing note and vitals reviewed.    ED Treatments / Results  Labs (all labs ordered are listed, but only abnormal results are  displayed) Labs Reviewed - No data to display  EKG  EKG Interpretation None       Radiology Dg Knee 2 Views Left  Result Date: 11/28/2017 CLINICAL DATA:  Fall with left foot pain and knee pain. Initial encounter. EXAM: LEFT KNEE - 1-2 VIEW COMPARISON:  None. FINDINGS: No acute osseous or joint abnormality. IMPRESSION: Negative. Electronically Signed   By: Leanna BattlesMelinda  Blietz M.D.   On: 11/28/2017 10:07   Dg Ankle 2 Views Left  Result Date: 11/28/2017 CLINICAL DATA:  Fall with left foot pain initial encounter. EXAM: LEFT ANKLE - 2 VIEW COMPARISON:  None. FINDINGS: No acute osseous abnormality.  Ankle mortise is intact. IMPRESSION: No acute findings. Electronically Signed   By: Leanna BattlesMelinda  Blietz M.D.   On: 11/28/2017 10:06   Dg Foot Complete Left  Result Date: 11/28/2017 CLINICAL DATA:  Larey SeatFell 2 days ago with left foot pain. EXAM: LEFT FOOT - COMPLETE 3+ VIEW COMPARISON:  None. FINDINGS: Negative for an acute fracture or dislocation. No focal soft tissue abnormality. Alignment of the left foot is within normal limits. IMPRESSION: No acute abnormality. Electronically Signed   By: Richarda OverlieAdam  Henn M.D.   On: 11/28/2017 10:06   Reviewed all x-rays and discussed with patient Margarita GrizzleDanielle Mariska Daffin  Procedures Procedures (including critical care time)  Medications Ordered in ED Medications - No data to display   Initial Impression / Assessment and Plan / ED Course  I have reviewed the triage vital signs and the nursing notes.  Pertinent labs & imaging results that were available during my care of the patient were reviewed by me and considered in my medical decision making (see chart for details).     Left foot  Injury from fall.  No fracture noted. Injury localized to left foot Patient with prior contracture from stroke and will use cane.  Plan post op boot.   Final Clinical Impressions(s) / ED Diagnoses   Final diagnoses:  Foot pain, left    ED Discharge Orders    None       Margarita Grizzleay, Jacorie Ernsberger,  MD 11/28/17 1020    Margarita Grizzleay, Darryon Bastin, MD 11/28/17 1021

## 2017-11-28 NOTE — ED Triage Notes (Signed)
Pt reports falling two days ago and has L foot pain. Has not taken OTC medicines today. Ambulatory in lobby with use of cane.

## 2017-11-28 NOTE — ED Notes (Signed)
Patient transported to X-ray 

## 2018-01-19 DIAGNOSIS — I10 Essential (primary) hypertension: Secondary | ICD-10-CM | POA: Diagnosis not present

## 2018-01-19 DIAGNOSIS — Z125 Encounter for screening for malignant neoplasm of prostate: Secondary | ICD-10-CM | POA: Diagnosis not present

## 2018-01-19 DIAGNOSIS — E876 Hypokalemia: Secondary | ICD-10-CM | POA: Diagnosis not present

## 2018-01-19 DIAGNOSIS — E559 Vitamin D deficiency, unspecified: Secondary | ICD-10-CM | POA: Diagnosis not present

## 2018-01-19 DIAGNOSIS — E7849 Other hyperlipidemia: Secondary | ICD-10-CM | POA: Diagnosis not present

## 2018-02-16 DIAGNOSIS — I10 Essential (primary) hypertension: Secondary | ICD-10-CM | POA: Diagnosis not present

## 2018-02-16 DIAGNOSIS — E876 Hypokalemia: Secondary | ICD-10-CM | POA: Diagnosis not present

## 2018-02-16 DIAGNOSIS — Z0001 Encounter for general adult medical examination with abnormal findings: Secondary | ICD-10-CM | POA: Diagnosis not present

## 2018-02-16 DIAGNOSIS — E559 Vitamin D deficiency, unspecified: Secondary | ICD-10-CM | POA: Diagnosis not present

## 2018-02-16 DIAGNOSIS — D508 Other iron deficiency anemias: Secondary | ICD-10-CM | POA: Diagnosis not present

## 2018-02-16 DIAGNOSIS — Z79899 Other long term (current) drug therapy: Secondary | ICD-10-CM | POA: Diagnosis not present

## 2018-02-16 DIAGNOSIS — D649 Anemia, unspecified: Secondary | ICD-10-CM | POA: Diagnosis not present

## 2018-02-18 ENCOUNTER — Encounter (HOSPITAL_COMMUNITY): Payer: Self-pay | Admitting: Emergency Medicine

## 2018-02-18 ENCOUNTER — Other Ambulatory Visit: Payer: Self-pay

## 2018-02-18 ENCOUNTER — Encounter: Payer: Self-pay | Admitting: Internal Medicine

## 2018-02-18 ENCOUNTER — Inpatient Hospital Stay (HOSPITAL_COMMUNITY)
Admission: EM | Admit: 2018-02-18 | Discharge: 2018-02-20 | DRG: 379 | Disposition: A | Discharge status: Home / Self Care | Payer: Medicare HMO | Attending: Internal Medicine | Admitting: Internal Medicine

## 2018-02-18 ENCOUNTER — Emergency Department (HOSPITAL_COMMUNITY): Payer: Medicare HMO

## 2018-02-18 DIAGNOSIS — K922 Gastrointestinal hemorrhage, unspecified: Secondary | ICD-10-CM | POA: Diagnosis present

## 2018-02-18 DIAGNOSIS — K2970 Gastritis, unspecified, without bleeding: Secondary | ICD-10-CM | POA: Diagnosis not present

## 2018-02-18 DIAGNOSIS — D500 Iron deficiency anemia secondary to blood loss (chronic): Secondary | ICD-10-CM | POA: Diagnosis present

## 2018-02-18 DIAGNOSIS — K921 Melena: Secondary | ICD-10-CM | POA: Diagnosis present

## 2018-02-18 DIAGNOSIS — E876 Hypokalemia: Secondary | ICD-10-CM | POA: Diagnosis present

## 2018-02-18 DIAGNOSIS — T39395A Adverse effect of other nonsteroidal anti-inflammatory drugs [NSAID], initial encounter: Secondary | ICD-10-CM | POA: Diagnosis not present

## 2018-02-18 DIAGNOSIS — I10 Essential (primary) hypertension: Secondary | ICD-10-CM | POA: Diagnosis present

## 2018-02-18 DIAGNOSIS — K226 Gastro-esophageal laceration-hemorrhage syndrome: Secondary | ICD-10-CM | POA: Diagnosis present

## 2018-02-18 DIAGNOSIS — Z8673 Personal history of transient ischemic attack (TIA), and cerebral infarction without residual deficits: Secondary | ICD-10-CM

## 2018-02-18 DIAGNOSIS — Z79899 Other long term (current) drug therapy: Secondary | ICD-10-CM | POA: Diagnosis not present

## 2018-02-18 DIAGNOSIS — R0602 Shortness of breath: Secondary | ICD-10-CM | POA: Diagnosis not present

## 2018-02-18 DIAGNOSIS — K264 Chronic or unspecified duodenal ulcer with hemorrhage: Principal | ICD-10-CM | POA: Diagnosis present

## 2018-02-18 DIAGNOSIS — K2951 Unspecified chronic gastritis with bleeding: Secondary | ICD-10-CM | POA: Diagnosis not present

## 2018-02-18 DIAGNOSIS — I639 Cerebral infarction, unspecified: Secondary | ICD-10-CM | POA: Diagnosis present

## 2018-02-18 DIAGNOSIS — K319 Disease of stomach and duodenum, unspecified: Secondary | ICD-10-CM | POA: Diagnosis not present

## 2018-02-18 DIAGNOSIS — Z7982 Long term (current) use of aspirin: Secondary | ICD-10-CM | POA: Diagnosis not present

## 2018-02-18 DIAGNOSIS — K3189 Other diseases of stomach and duodenum: Secondary | ICD-10-CM | POA: Diagnosis not present

## 2018-02-18 DIAGNOSIS — K269 Duodenal ulcer, unspecified as acute or chronic, without hemorrhage or perforation: Secondary | ICD-10-CM | POA: Diagnosis not present

## 2018-02-18 DIAGNOSIS — D72829 Elevated white blood cell count, unspecified: Secondary | ICD-10-CM | POA: Diagnosis present

## 2018-02-18 DIAGNOSIS — D649 Anemia, unspecified: Secondary | ICD-10-CM | POA: Diagnosis not present

## 2018-02-18 DIAGNOSIS — K2960 Other gastritis without bleeding: Secondary | ICD-10-CM | POA: Diagnosis not present

## 2018-02-18 DIAGNOSIS — K219 Gastro-esophageal reflux disease without esophagitis: Secondary | ICD-10-CM | POA: Diagnosis not present

## 2018-02-18 DIAGNOSIS — K228 Other specified diseases of esophagus: Secondary | ICD-10-CM | POA: Diagnosis not present

## 2018-02-18 DIAGNOSIS — E785 Hyperlipidemia, unspecified: Secondary | ICD-10-CM | POA: Diagnosis present

## 2018-02-18 LAB — BASIC METABOLIC PANEL
Anion gap: 7 (ref 5–15)
BUN: 26 mg/dL — AB (ref 6–20)
CO2: 25 mmol/L (ref 22–32)
Calcium: 9.4 mg/dL (ref 8.9–10.3)
Chloride: 111 mmol/L (ref 101–111)
Creatinine, Ser: 1.19 mg/dL (ref 0.61–1.24)
GFR calc Af Amer: >60 mL/min (ref >60)
GLUCOSE: 108 mg/dL — AB (ref 65–99)
POTASSIUM: 3.2 mmol/L — AB (ref 3.5–5.1)
SODIUM: 143 mmol/L (ref 135–145)

## 2018-02-18 LAB — URINALYSIS, ROUTINE W REFLEX MICROSCOPIC
BILIRUBIN URINE: NEGATIVE
Glucose, UA: NEGATIVE mg/dL
HGB URINE DIPSTICK: NEGATIVE
Ketones, ur: NEGATIVE mg/dL
Leukocytes, UA: NEGATIVE
Nitrite: NEGATIVE
Protein, ur: NEGATIVE mg/dL
SPECIFIC GRAVITY, URINE: 1.012 (ref 1.005–1.030)
pH: 7 (ref 5.0–8.0)

## 2018-02-18 LAB — IRON AND TIBC
IRON: 43 ug/dL — AB (ref 45–182)
Saturation Ratios: 11 % — ABNORMAL LOW (ref 17.9–39.5)
TIBC: 391 ug/dL (ref 250–450)
UIBC: 348 ug/dL

## 2018-02-18 LAB — FERRITIN: Ferritin: 3 ng/mL — ABNORMAL LOW (ref 24–336)

## 2018-02-18 LAB — CBC WITH DIFFERENTIAL/PLATELET
Basophils Absolute: 0 10*3/uL (ref 0.0–0.1)
Basophils Relative: 0 %
EOS ABS: 0.3 10*3/uL (ref 0.0–0.7)
Eosinophils Relative: 2 %
HEMATOCRIT: 19.7 % — AB (ref 39.0–52.0)
HEMOGLOBIN: 6.1 g/dL — AB (ref 13.0–17.0)
LYMPHS ABS: 3.4 10*3/uL (ref 0.7–4.0)
LYMPHS PCT: 24 %
MCH: 23.9 pg — AB (ref 26.0–34.0)
MCHC: 31 g/dL (ref 30.0–36.0)
MCV: 77.3 fL — ABNORMAL LOW (ref 78.0–100.0)
Monocytes Absolute: 1.3 10*3/uL — ABNORMAL HIGH (ref 0.1–1.0)
Monocytes Relative: 9 %
NEUTROS ABS: 8.9 10*3/uL — AB (ref 1.7–7.7)
NEUTROS PCT: 65 %
Platelets: 292 10*3/uL (ref 150–400)
RBC: 2.55 MIL/uL — AB (ref 4.22–5.81)
RDW: 16.7 % — ABNORMAL HIGH (ref 11.5–15.5)
WBC: 14 10*3/uL — AB (ref 4.0–10.5)

## 2018-02-18 LAB — RETICULOCYTES
RBC.: 2.36 MIL/uL — ABNORMAL LOW (ref 4.22–5.81)
RETIC CT PCT: 4.9 % — AB (ref 0.4–3.1)
Retic Count, Absolute: 115.6 10*3/uL (ref 19.0–186.0)

## 2018-02-18 LAB — RAPID URINE DRUG SCREEN, HOSP PERFORMED
Amphetamines: NOT DETECTED
BARBITURATES: NOT DETECTED
Benzodiazepines: NOT DETECTED
Cocaine: NOT DETECTED
OPIATES: NOT DETECTED
TETRAHYDROCANNABINOL: NOT DETECTED

## 2018-02-18 LAB — BRAIN NATRIURETIC PEPTIDE: B Natriuretic Peptide: 283 pg/mL — ABNORMAL HIGH (ref 0.0–100.0)

## 2018-02-18 LAB — VITAMIN B12: VITAMIN B 12: 159 pg/mL — AB (ref 180–914)

## 2018-02-18 LAB — POC OCCULT BLOOD, ED: Fecal Occult Bld: POSITIVE — AB

## 2018-02-18 LAB — TROPONIN I: Troponin I: <0.03 ng/mL (ref <0.03)

## 2018-02-18 LAB — MAGNESIUM: MAGNESIUM: 1.7 mg/dL (ref 1.7–2.4)

## 2018-02-18 LAB — PREPARE RBC (CROSSMATCH)

## 2018-02-18 LAB — FOLATE: FOLATE: 9 ng/mL (ref >5.9)

## 2018-02-18 MED ORDER — PANTOPRAZOLE SODIUM 40 MG PO TBEC
40.0000 mg | DELAYED_RELEASE_TABLET | Freq: Every day | ORAL | Status: DC
  Administered 2018-02-19 – 2018-02-20 (×2): 40 mg via ORAL
  Filled 2018-02-18 (×2): qty 1

## 2018-02-18 MED ORDER — SODIUM CHLORIDE 0.9 % IV SOLN
INTRAVENOUS | Status: DC
  Administered 2018-02-18 – 2018-02-19 (×2): via INTRAVENOUS

## 2018-02-18 MED ORDER — ACETAMINOPHEN 325 MG PO TABS
650.0000 mg | ORAL_TABLET | Freq: Four times a day (QID) | ORAL | Status: DC | PRN
  Administered 2018-02-18: 650 mg via ORAL
  Filled 2018-02-18: qty 2

## 2018-02-18 MED ORDER — ALBUTEROL SULFATE (2.5 MG/3ML) 0.083% IN NEBU
5.0000 mg | INHALATION_SOLUTION | Freq: Once | RESPIRATORY_TRACT | Status: AC
  Administered 2018-02-18: 5 mg via RESPIRATORY_TRACT
  Filled 2018-02-18: qty 6

## 2018-02-18 MED ORDER — HYDRALAZINE HCL 25 MG PO TABS
25.0000 mg | ORAL_TABLET | Freq: Three times a day (TID) | ORAL | Status: DC
  Administered 2018-02-18 – 2018-02-20 (×6): 25 mg via ORAL
  Filled 2018-02-18 (×6): qty 1

## 2018-02-18 MED ORDER — VITAMIN D 50 MCG (2000 UT) PO CAPS: 2000.0000 [IU] | ORAL_CAPSULE | Freq: Every day | ORAL | Status: DC

## 2018-02-18 MED ORDER — ACETAMINOPHEN 650 MG RE SUPP: 650.0000 mg | Freq: Four times a day (QID) | RECTAL | Status: DC | PRN

## 2018-02-18 MED ORDER — ONDANSETRON HCL 4 MG/2ML IJ SOLN: 4.0000 mg | Freq: Four times a day (QID) | INTRAMUSCULAR | Status: DC | PRN

## 2018-02-18 MED ORDER — CHOLECALCIFEROL 10 MCG (400 UNIT) PO TABS
200.0000 [IU] | ORAL_TABLET | Freq: Every day | ORAL | Status: DC
  Administered 2018-02-19 – 2018-02-20 (×2): 200 [IU] via ORAL
  Filled 2018-02-18 (×2): qty 1

## 2018-02-18 MED ORDER — LOSARTAN POTASSIUM 50 MG PO TABS
100.0000 mg | ORAL_TABLET | Freq: Every day | ORAL | Status: DC
  Administered 2018-02-19 – 2018-02-20 (×2): 100 mg via ORAL
  Filled 2018-02-18 (×2): qty 2

## 2018-02-18 MED ORDER — POTASSIUM CHLORIDE 10 MEQ/100ML IV SOLN
10.0000 meq | INTRAVENOUS | Status: DC
  Filled 2018-02-18: qty 100

## 2018-02-18 MED ORDER — ONDANSETRON HCL 4 MG PO TABS: 4.0000 mg | ORAL_TABLET | Freq: Four times a day (QID) | ORAL | Status: DC | PRN

## 2018-02-18 MED ORDER — SODIUM CHLORIDE 0.9 % IV SOLN: 10.0000 mL/h | Freq: Once | INTRAVENOUS | Status: DC

## 2018-02-18 MED ORDER — PANTOPRAZOLE SODIUM 40 MG IV SOLR
80.0000 mg | Freq: Once | INTRAVENOUS | Status: AC
  Administered 2018-02-18: 80 mg via INTRAVENOUS
  Filled 2018-02-18: qty 80

## 2018-02-18 MED ORDER — ATORVASTATIN CALCIUM 20 MG PO TABS
20.0000 mg | ORAL_TABLET | Freq: Every day | ORAL | Status: DC
  Administered 2018-02-18 – 2018-02-20 (×3): 20 mg via ORAL
  Filled 2018-02-18 (×3): qty 1

## 2018-02-18 MED ORDER — TRAZODONE HCL 50 MG PO TABS: 25.0000 mg | ORAL_TABLET | Freq: Every evening | ORAL | Status: DC | PRN

## 2018-02-18 NOTE — ED Triage Notes (Signed)
Pt c/o of sob since yesterday. States PCP switched his HTN meds and having elevated BP since.

## 2018-02-18 NOTE — ED Notes (Signed)
Date and time results received: 02/18/18 1:14 PM   Test: hgb  Critical Value: 6.1  Name of Provider Notified: Long  Orders Received? Or Actions Taken?: no new orders at this time.

## 2018-02-18 NOTE — ED Provider Notes (Addendum)
Emergency Department Provider Note   I have reviewed the triage vital signs and the nursing notes.   HISTORY  Chief Complaint Shortness of Breath   HPI Scott Ritter is a 57 y.o. male with PMH of HTN and prior CVA with mild left sided deficits presents to the ED for evaluation of shortness of breath worsening over the past 2 days.  Patient denies any associated chest pain.  He has no prior history of dyspnea.  He does not smoke cigarettes.  He had amlodipine discontinued recently due to gum swelling.  He states since that time his blood pressures have been elevated and he has "just not felt right."  2 days ago he noticed worsening exertional dyspnea without chest pain.  He denies any lightheadedness.  No fevers or chills.  No productive cough.  No vomiting or diarrhea.  He has some baseline mild swelling in the left leg which is unchanged.    Past Medical History:  Diagnosis Date  . Hypertension   . Stroke [facility])     Patient Active Problem List   Diagnosis Date Noted  . Symptomatic anemia 02/18/2018  . Melena 02/18/2018  . History of Stroke 02/18/2018  . Leukocytosis 02/18/2018  . NSAID induced gastritis 02/18/2018  . Hyperlipidemia 02/18/2018  . Gastrointestinal hemorrhage with melena   . History of Mallory-Weiss tear 12/16/2016  . Hematemesis 12/16/2016  . HTN (hypertension) 12/16/2016  . Hypokalemia 12/16/2016  . Acute upper GI bleed   . Special screening for malignant neoplasms, colon     Past Surgical History:  Procedure Laterality Date  . BACK SURGERY    . COLONOSCOPY N/A 02/29/2016   Dr. Darrick Penna: redundant colon, non-bleeding internal hemorrhoids   . ESOPHAGOGASTRODUODENOSCOPY N/A 12/17/2016   Procedure: ESOPHAGOGASTRODUODENOSCOPY (EGD);  Surgeon: West Bali, MD;  Location: [facility];  Service: Endoscopy;  Laterality: N/A;  . HERNIA REPAIR        Allergies Patient has no known allergies.  Family History  Problem Relation Age of Onset  .  Hypertension Father   . Colon cancer Neg Hx     Social History Social History   Tobacco Use  . Smoking status: Never Smoker  . Smokeless tobacco: Never Used  Substance Use Topics  . Alcohol use: Yes    Comment: occasionally  . Drug use: No    Review of Systems  Constitutional: No fever/chills Eyes: No visual changes. ENT: No sore throat. Cardiovascular: Denies chest pain. Respiratory: Positive shortness of breath. Gastrointestinal: No abdominal pain.  No nausea, no vomiting.  No diarrhea.  No constipation. Genitourinary: Negative for dysuria. Musculoskeletal: Negative for back pain. Skin: Negative for rash. Neurological: Negative for headaches, focal weakness or numbness.  10-point ROS otherwise negative.  ____________________________________________   PHYSICAL EXAM:  VITAL SIGNS: ED Triage Vitals [02/18/18 1134]  Enc Vitals Group     BP (!) 180/77     Pulse Rate 82     Resp 18     Temp 98.3 F (36.8 C)     Temp Source Oral     SpO2 99 %     Weight 214 lb (97.1 kg)     Height  (1.702 m)     Pain Score 5   Constitutional: Alert and oriented. Well appearing and in no acute distress. Eyes: Conjunctivae are normal.  Head: Atraumatic. Nose: No congestion/rhinnorhea. Mouth/Throat: Mucous membranes are moist.  Oropharynx non-erythematous. Neck: No stridor.   Cardiovascular: Normal rate, regular rhythm. Good peripheral circulation.  Grossly normal heart sounds.   Respiratory: Normal respiratory effort.  No retractions. Lungs CTAB. Gastrointestinal: Soft and nontender. No distention.  Musculoskeletal: No lower extremity tenderness nor edema. No gross deformities of extremities. Neurologic:  Normal speech and language. No gross focal neurologic deficits are appreciated.  Skin:  Skin is warm, dry and intact. No rash noted.  ____________________________________________   LABS (all labs ordered are listed, but only abnormal results are displayed)  Labs  Reviewed  BASIC METABOLIC PANEL - Abnormal; Notable for the following components:      Result Value   Potassium 3.2 (*)    Glucose, Bld 108 (*)    BUN 26 (*)    All other components within normal limits  BRAIN NATRIURETIC PEPTIDE - Abnormal; Notable for the following components:   B Natriuretic Peptide 283.0 (*)    All other components within normal limits  CBC WITH DIFFERENTIAL/PLATELET - Abnormal; Notable for the following components:   WBC 14.0 (*)    RBC 2.55 (*)    Hemoglobin 6.1 (*)    HCT 19.7 (*)    MCV 77.3 (*)    MCH 23.9 (*)    RDW 16.7 (*)    Neutro Abs 8.9 (*)    Monocytes Absolute 1.3 (*)    All other components within normal limits  RETICULOCYTES - Abnormal; Notable for the following components:   Retic Ct Pct 4.9 (*)    RBC. 2.36 (*)    All other components within normal limits  URINALYSIS, ROUTINE W REFLEX MICROSCOPIC - Abnormal; Notable for the following components:   Color, Urine STRAW (*)    All other components within normal limits  POC OCCULT BLOOD, ED - Abnormal; Notable for the following components:   Fecal Occult Bld POSITIVE (*)    All other components within normal limits  TROPONIN I  RAPID URINE DRUG SCREEN, HOSP PERFORMED  MAGNESIUM  VITAMIN B12  FOLATE  IRON AND TIBC  FERRITIN  MAGNESIUM  HIV ANTIBODY (ROUTINE TESTING)  CBC  BASIC METABOLIC PANEL  PREPARE RBC (CROSSMATCH)  TYPE AND SCREEN   ____________________________________________  EKG   EKG Interpretation  Date/Time:  Thursday Feb 18 2018 12:34:20 EDT Ventricular Rate:  85 PR Interval:    QRS Duration: 97 QT Interval:  388 QTC Calculation: 462 R Axis:   104 Text Interpretation:  Sinus rhythm Right axis deviation Baseline wander in lead(s) V2 No STEMI.  Confirmed by Alona Bene [phone]) on 02/18/2018 12:36:24 PM       ____________________________________________   PROCEDURES  Procedure(s) performed:   .Critical Care Performed by: Maia Plan, MD Authorized by:  Maia Plan, MD   Critical care provider statement:    Critical care time (minutes):  35   Critical care time was exclusive of:  Separately billable procedures and treating other patients and teaching time   Critical care was necessary to treat or prevent imminent or life-threatening deterioration of the following conditions:  Circulatory failure and dehydration   Critical care was time spent personally by me on the following activities:  Blood draw for specimens, discussions with consultants, evaluation of patient's response to treatment, development of treatment plan with patient or surrogate, examination of patient, obtaining history from patient or surrogate, ordering and performing treatments and interventions, ordering and review of laboratory studies, ordering and review of radiographic studies, pulse oximetry, re-evaluation of patient's condition and review of old charts   I assumed direction of critical care for this patient from another provider in  my specialty: no       ____________________________________________   INITIAL IMPRESSION / ASSESSMENT AND PLAN / ED COURSE  Pertinent labs & imaging results that were available during my care of the patient were reviewed by me and considered in my medical decision making (see chart for details).  Patient presents to the emergency department for evaluation of exertional dyspnea for the past 2 days.  He has no wheezing on exam.  He received an albuterol treatment from triage with no improvement in symptoms.  He has no significant lower extremity edema or tenderness.  Denies chest pain.  No evidence of volume overload.  No upper airway swelling.  Patient was told by his PCP recently that he is anemic.  Plan for baseline labs, chest x-ray, and EKG.  Very low suspicion for atypical ACS presentation.   01:25 PM Patient has a hemoglobin of 6.1.  Rectal exam shows melena.  Patient is not anticoagulated.  He is having no abdominal or chest pain.   Will initiate Protonix bolus and have ordered for 2 units PRBCs to be given in the emergency department.  I will discussed the case with gastroneurology on-call.  Anticipate admission.   Spoke with Dr. Jena Gauss with GI who will evaluate the patient as consult.   Discussed patient's case with Hospitalist, Dr. Laural Benes to request admission. Patient and family (if present) updated with plan. Care transferred to Hospitalist service.  I reviewed all nursing notes, vitals, pertinent old records, EKGs, labs, imaging (as available).  ____________________________________________  FINAL CLINICAL IMPRESSION(S) / ED DIAGNOSES  Final diagnoses:  Symptomatic anemia  Gastrointestinal hemorrhage with melena     MEDICATIONS GIVEN DURING THIS VISIT:  Medications  atorvastatin (LIPITOR) tablet 20 mg (20 mg Oral Given 02/18/18 1712)  hydrALAZINE (APRESOLINE) tablet 25 mg (25 mg Oral Given 02/18/18 1712)  losartan (COZAAR) tablet 100 mg (has no administration in time range)  acetaminophen (TYLENOL) tablet 650 mg (650 mg Oral Given 02/18/18 1857)    Or  acetaminophen (TYLENOL) suppository 650 mg ( Rectal See Alternative 02/18/18 1857)  ondansetron (ZOFRAN) tablet 4 mg (has no administration in time range)    Or  ondansetron (ZOFRAN) injection 4 mg (has no administration in time range)  traZODone (DESYREL) tablet 25 mg (has no administration in time range)  pantoprazole (PROTONIX) EC tablet 40 mg (has no administration in time range)  potassium chloride 10 mEq in 100 mL IVPB (has no administration in time range)  0.9 %  sodium chloride infusion ( Intravenous New Bag/Given 02/18/18 1659)  cholecalciferol (VITAMIN D) tablet 200 Units (has no administration in time range)  albuterol (PROVENTIL) (2.5 MG/3ML) 0.083% nebulizer solution 5 mg (5 mg Nebulization Given 02/18/18 1205)  pantoprazole (PROTONIX) injection 80 mg (80 mg Intravenous Given 02/18/18 1348)     Note:  This document was prepared using Dragon voice  recognition software and may include unintentional dictation errors.  Alona Bene, MD Emergency Medicine    Arzell Mcgeehan, Arlyss Repress, MD 02/18/18 2103    Maia Plan, MD 02/18/18 2103

## 2018-02-18 NOTE — ED Notes (Signed)
Patient transported to X-ray 

## 2018-02-18 NOTE — ED Notes (Signed)
Pt states ekg done in triage.  Pt denies any CP

## 2018-02-18 NOTE — Consult Note (Addendum)
Referring Provider: [facility] Primary Care Physician:  Wilson Singer, MD Primary Gastroenterologist:  Dr. Darrick Penna  Date of Admission: 02/18/18 Date of Consultation: 02/18/18  Reason for Consultation:  Melena, symptomatic anemia  HPI:  Scott Ritter is a 57 y.o. male with a past medical history of hypertension and stroke.  The patient is only been seen by our service as an inpatient in the hospital.  He was recently admitted from 12/16/2016 through 12/17/2016 for acute upper GI bleed.  At that time he presented to the emergency department with hematemesis, nausea but without abdominal pain.  He did have some vomiting, some of which had bright red blood.  Denied melena.  Denied symptoms of anemia.  In the ER his vital signs are stable, hemoglobin was 12 and BUN 17.  He underwent EGD which revealed a Mallory-Weiss tear.  Hemoglobin remained stable and he did not require transfusion.  Recommend Protonix while on aspirin.  He was discharged in satisfactory condition.  Today the patient presents emergency department for shortness of breath over the past 2 days which is been progressive.  No associated chest pain and no history of dyspnea or smoking.  Denies lightheadedness, fever, chills, vomiting, diarrhea.  His labs reviewed white blood cell count of 14.0, hemoglobin is 6.1.  Heme positive on exam, gross melena noted on rectal exam.  No anticoagulation on board.  Initiated Protonix bolus in the emergency department.  2 units of PRBCs were ordered in the emergency department.  Patient was referred for admission and GI was consulted.  Medication list was reviewed which lists ibuprofen 200 mg as needed, 81 mg aspirin daily.  Specifically concerning that no PPIs listed on his home medication list despite NSAID use.  Today he states he began having dark stools about 2 weeks ago.  He started having progressive dyspnea 2 days ago which is gotten worse over the past day.  He has also had some  worsening fatigue and lethargy.  He remains on daily aspirin.  He takes intermittent ibuprofen but states he has been taking this more regularly over the past month due to a toothache.  Denies overt abdominal pain, GERD, nausea, vomiting, diarrhea, hematochezia.  He is not on a PPI at home.  No other overt GI symptoms.  Past Medical History:  Diagnosis Date  . Hypertension   . Stroke [facility])     Past Surgical History:  Procedure Laterality Date  . BACK SURGERY    . COLONOSCOPY N/A 02/29/2016   Dr. Darrick Penna: redundant colon, non-bleeding internal hemorrhoids   . ESOPHAGOGASTRODUODENOSCOPY N/A 12/17/2016   Procedure: ESOPHAGOGASTRODUODENOSCOPY (EGD);  Surgeon: West Bali, MD;  Location: [facility];  Service: Endoscopy;  Laterality: N/A;  . HERNIA REPAIR      Prior to Admission medications   Medication Sig Start Date End Date Taking? Authorizing Provider  amLODipine (NORVASC) 10 MG tablet Take 10 mg by mouth daily.   Yes [provider]  amoxicillin (AMOXIL) 500 MG capsule  01/15/18  Yes [provider]  aspirin EC 81 MG tablet Take 1 tablet (81 mg total) by mouth daily. Restart in 5 days 12/17/16  Yes Erick Blinks, MD  atorvastatin (LIPITOR) 20 MG tablet Take 20 mg by mouth daily. 05/04/15  Yes [provider]  Cholecalciferol (VITAMIN D) 2000 units CAPS Take 2,000 Units by mouth daily.   Yes [provider]  hydrALAZINE (APRESOLINE) 25 MG tablet Take 1 tablet by mouth daily. 10/22/17  Yes [provider]  hydrALAZINE (APRESOLINE) 50 MG tablet  01/19/18  Yes [provider]  losartan (COZAAR) 100 MG tablet Take 1 tablet by mouth daily. 10/22/17  Yes [provider]  losartan-hydrochlorothiazide (HYZAAR) 100-25 MG per tablet Take 1 tablet by mouth daily. 05/04/15  Yes [provider]  potassium chloride SA (K-DUR,KLOR-CON) 20 MEQ tablet Take 1 tablet (20 mEq total) by mouth daily. 12/17/16  Yes Erick Blinks, MD  ibuprofen  (ADVIL,MOTRIN) 800 MG tablet Take 200 mg by mouth as needed.  01/15/18   [provider]    Current Facility-Administered Medications  Medication Dose Route Frequency Provider Last Rate Last Dose  . 0.9 %  sodium chloride infusion  10 mL/hr Intravenous Once Long, Arlyss Repress, MD       Current Outpatient Medications  Medication Sig Dispense Refill  . amLODipine (NORVASC) 10 MG tablet Take 10 mg by mouth daily.    Marland Kitchen amoxicillin (AMOXIL) 500 MG capsule   0  . aspirin EC 81 MG tablet Take 1 tablet (81 mg total) by mouth daily. Restart in 5 days    . atorvastatin (LIPITOR) 20 MG tablet Take 20 mg by mouth daily.    . Cholecalciferol (VITAMIN D) 2000 units CAPS Take 2,000 Units by mouth daily.    . hydrALAZINE (APRESOLINE) 25 MG tablet Take 1 tablet by mouth daily.    . hydrALAZINE (APRESOLINE) 50 MG tablet     . losartan (COZAAR) 100 MG tablet Take 1 tablet by mouth daily.    Marland Kitchen losartan-hydrochlorothiazide (HYZAAR) 100-25 MG per tablet Take 1 tablet by mouth daily.    . potassium chloride SA (K-DUR,KLOR-CON) 20 MEQ tablet Take 1 tablet (20 mEq total) by mouth daily. 30 tablet 0  . ibuprofen (ADVIL,MOTRIN) 800 MG tablet Take 200 mg by mouth as needed.   0    Allergies as of 02/18/2018  . (No Known Allergies)    Family History  Problem Relation Age of Onset  . Hypertension Father   . Colon cancer Neg Hx     Social History   Socioeconomic History  . Marital status: Married    Spouse name: Not on file  . Number of children: Not on file  . Years of education: Not on file  . Highest education level: Not on file  Occupational History  . Not on file  Social Needs  . Financial resource strain: Not on file  . Food insecurity:    Worry: Not on file    Inability: Not on file  . Transportation needs:    Medical: Not on file    Non-medical: Not on file  Tobacco Use  . Smoking status: Never Smoker  . Smokeless tobacco: Never Used  Substance and Sexual Activity  . Alcohol use:  Yes    Comment: occasionally  . Drug use: No  . Sexual activity: Not on file  Lifestyle  . Physical activity:    Days per week: Not on file    Minutes per session: Not on file  . Stress: Not on file  Relationships  . Social connections:    Talks on phone: Not on file    Gets together: Not on file    Attends religious service: Not on file    Active member of club or organization: Not on file    Attends meetings of clubs or organizations: Not on file    Relationship status: Not on file  . Intimate partner violence:    Fear of current or ex partner: Not  on file    Emotionally abused: Not on file    Physically abused: Not on file    Forced sexual activity: Not on file  Other Topics Concern  . Not on file  Social History Narrative  . Not on file    Review of Systems: General: Negative for anorexia, weight loss, fever, chills. ENT: Negative for hoarseness, difficulty swallowing. CV: Negative for chest pain, angina, palpitations, peripheral edema.  Respiratory: Negative for dyspnea at rest, cough, sputum, wheezing.  GI: See history of present illness. Derm: Negative for rash or itching.  Endo: Negative for unusual weight change.  Heme: Negative for bruising or bleeding. Neuro: Admits left-sided weakness s/p CVA history. Allergy: Negative for rash or hives.  Physical Exam: Vital signs in last 24 hours: Temp:  [98.3 F (36.8 C)] 98.3 F (36.8 C) (05/09 1134) Pulse Rate:  [77-90] 78 (05/09 1345) Resp:  [11-20] 17 (05/09 1345) BP: (164-180)/(62-77) 164/62 (05/09 1300) SpO2:  [97 %-100 %] 99 % (05/09 1345) Weight:  [214 lb (97.1 kg)] 214 lb (97.1 kg) (05/09 1134)   General:   Alert,  Well-developed, well-nourished, pleasant and cooperative in NAD Head:  Normocephalic and atraumatic. Eyes:  Sclera clear, no icterus. Conjunctiva pink. Ears:  Normal auditory acuity. Neck:  Supple; no masses or thyromegaly. Lungs:  Clear throughout to auscultation. No wheezes, crackles, or  rhonchi. No acute distress. Heart:  Regular rate and rhythm; no murmurs, clicks, rubs,  or gallops. Abdomen:  Soft, nontender and nondistended. No masses, hepatosplenomegaly or hernias noted. Normal bowel sounds, without guarding, and without rebound.   Rectal:  Deferred.   Msk:  Symmetrical without gross deformities. Pulses:  Normal bilateral DP pulses noted. Extremities:  Without clubbing or edema. Neurologic:  Alert and  oriented x4;  Noted left-sided weakness with unilateral decreased grip strength and leg raise. Skin:  Intact without significant lesions or rashes. Psych:  Alert and cooperative. Normal mood and affect.  Intake/Output from previous day: No intake/output data recorded. Intake/Output this shift: No intake/output data recorded.  Lab Results: Recent Labs    02/18/18 1230  WBC 14.0*  HGB 6.1*  HCT 19.7*  PLT 292   BMET Recent Labs    02/18/18 1230  NA 143  K 3.2*  CL 111  CO2 25  GLUCOSE 108*  BUN 26*  CREATININE 1.19  CALCIUM 9.4   LFT No results for input(s): PROT, ALBUMIN, AST, ALT, ALKPHOS, BILITOT, BILIDIR, IBILI in the last 72 hours. PT/INR No results for input(s): LABPROT, INR in the last 72 hours. Hepatitis Panel No results for input(s): HEPBSAG, HCVAB, HEPAIGM, HEPBIGM in the last 72 hours. C-Diff No results for input(s): CDIFFTOX in the last 72 hours.  Studies/Results: No results found.  Impression: Less than 70 year old gentleman who presents with melena, symptomatic anemia with a hemoglobin in the 6 range.  He is currently getting blood in the emergency department.  Vital signs are stable.  He has had progressive dyspnea and weakness/lethargy.  Rectal exam was heme positive and noted gross melena per the ED provider.  He was admitted about a year and 3 months ago for hematemesis and found to have a Mallory-Weiss tear.  He was recommended to take a PPI as long as he on daily aspirin.  He is no longer on PPI.  He is on daily aspirin still  and, additionally, he is on ibuprofen for recent toothache pretty regularly over the past month.  Denies hematochezia.  His BUN is elevated at 26 as  would be expected.  Last meal was about 3 to 4 hours ago.  Differentials include esophagitis, gastritis, duodenitis, peptic ulcer disease, duodenal ulcer.  Less likely neoplastic process or AVM.  No sedating medications on home list. Last EGD 1 yr 2 months ago sucessful on conscious sedation.  In the event he is scheduled for EGD: the risks, benefits, and alternatives have been discussed with the patient in detail. The patient states understanding and desires to proceed.   Plan: 1. Clear liquids tonight 2. NPO after midnight 3. Would liekly benefit from EGD tomorrow (conscious sedation), will discuss with Dr. Jena Gauss 4. Monitor for recurrent GI bleed 5. Agree with PPI bolus and gtt until EGD proves it unnecessary 6. Monitor H/H 7. Transfuse as necessary 8. Supportive measures   Thank you for allowing [place] to participate in the care of Scott Gobble, DNP, AGNP-C Adult & Gerontological Nurse Practitioner [facility]    LOS: 0 days     02/18/2018, 2:26 PM

## 2018-02-18 NOTE — H&P (Addendum)
History and Physical  Scott Ritter [id number]:[id number] DOB: 07-20-1961 DOA: 02/18/2018  Referring physician: Jacqulyn Bath PCP: Wilson Singer, MD   Chief Complaint: Shortness of breath   HPI: Scott Ritter is a 57 y.o. male with a past medical history of Mallory-Weiss tear, NSAID induced gastritis and hypertension, prior CVA, hematemesis presented to the emergency department complaining of progressive shortness of breath over the past several days.  He denies having chest pain.  He says that he has not been feeling well.  He has had elevated blood pressures.  He has been having some dental problems and been taking ibuprofen because of dental pain.  He has recently stopped amlodipine due to swelling in the gums.  He denies fever and chills nausea and vomiting. ED course: The patient was evaluated in the ED and noted to have a hemoglobin of 6.1.  He also noted that he has been having melena.  He denied having abdominal pain.  His rectal exam was Hemoccult positive.  He was ordered for packed red blood cells and admission was requested.  GI was consulted and recommended starting a Protonix infusion.  Review of Systems: All systems reviewed and apart from history of presenting illness, are negative.  Past Medical History:  Diagnosis Date  . Hypertension   . Stroke [facility])    Past Surgical History:  Procedure Laterality Date  . BACK SURGERY    . COLONOSCOPY N/A 02/29/2016   Dr. Darrick Penna: redundant colon, non-bleeding internal hemorrhoids   . ESOPHAGOGASTRODUODENOSCOPY N/A 12/17/2016   Procedure: ESOPHAGOGASTRODUODENOSCOPY (EGD);  Surgeon: West Bali, MD;  Location: [facility];  Service: Endoscopy;  Laterality: N/A;  . HERNIA REPAIR     Social History:  reports that he has never smoked. He has never used smokeless tobacco. He reports that he drinks alcohol. He reports that he does not use drugs.  No Known Allergies  Family History  Problem Relation Age of Onset  . Hypertension Father     . Colon cancer Neg Hx     Prior to Admission medications   Medication Sig Start Date End Date Taking? Authorizing Provider  amLODipine (NORVASC) 10 MG tablet Take 10 mg by mouth daily.   Yes [provider]  amoxicillin (AMOXIL) 500 MG capsule  01/15/18  Yes [provider]  aspirin EC 81 MG tablet Take 1 tablet (81 mg total) by mouth daily. Restart in 5 days 12/17/16  Yes Erick Blinks, MD  atorvastatin (LIPITOR) 20 MG tablet Take 20 mg by mouth daily. 05/04/15  Yes [provider]  Cholecalciferol (VITAMIN D) 2000 units CAPS Take 2,000 Units by mouth daily.   Yes [provider]  hydrALAZINE (APRESOLINE) 25 MG tablet Take 1 tablet by mouth daily. 10/22/17  Yes [provider]  hydrALAZINE (APRESOLINE) 50 MG tablet  01/19/18  Yes [provider]  losartan (COZAAR) 100 MG tablet Take 1 tablet by mouth daily. 10/22/17  Yes [provider]  losartan-hydrochlorothiazide (HYZAAR) 100-25 MG per tablet Take 1 tablet by mouth daily. 05/04/15  Yes [provider]  potassium chloride SA (K-DUR,KLOR-CON) 20 MEQ tablet Take 1 tablet (20 mEq total) by mouth daily. 12/17/16  Yes Erick Blinks, MD  ibuprofen (ADVIL,MOTRIN) 800 MG tablet Take 200 mg by mouth as needed.  01/15/18   [provider]   Physical Exam: Vitals:   02/18/18 1300 02/18/18 1315 02/18/18 1330 02/18/18 1345  BP: (!) 164/62     Pulse: 90 83 84 78  Resp: Temp:      TempSrc:      SpO2: 99% 97% 100% 99%  Weight:      Height:        General exam: Moderately built and nourished patient, lying comfortably supine on the gurney in no obvious distress.  Head, eyes and ENT: Nontraumatic and normocephalic. Pupils equally reacting to light and accommodation. Oral mucosa dry.  Neck: Supple. No JVD, carotid bruit or thyromegaly.  Lymphatics: No lymphadenopathy.  Respiratory system: Clear to auscultation. No increased work of breathing.  Cardiovascular  system: S1 and S2 heard with murmur heard.Marland Kitchen No JVD.  Gastrointestinal system: Abdomen is nondistended, soft and nontender. Normal bowel sounds heard. No organomegaly or masses appreciated.  Central nervous system: Alert and oriented. No focal neurological deficits.  Extremities: Symmetric 5 x 5 power. Peripheral pulses symmetrically felt.   Skin: No rashes or acute findings.  Musculoskeletal system: Negative exam.  Psychiatry: Pleasant and cooperative.  Labs on Admission:  Basic Metabolic Panel: Recent Labs  Lab 02/18/18 1230  NA 143  K 3.2*  CL 111  CO2 25  GLUCOSE 108*  BUN 26*  CREATININE 1.19  CALCIUM 9.4   Liver Function Tests: No results for input(s): AST, ALT, ALKPHOS, BILITOT, PROT, ALBUMIN in the last 168 hours. No results for input(s): LIPASE, AMYLASE in the last 168 hours. No results for input(s): AMMONIA in the last 168 hours. CBC: Recent Labs  Lab 02/18/18 1230  WBC 14.0*  NEUTROABS 8.9*  HGB 6.1*  HCT 19.7*  MCV 77.3*  PLT 292   Cardiac Enzymes: Recent Labs  Lab 02/18/18 1230  TROPONINI <0.03    BNP (last 3 results) No results for input(s): PROBNP in the last 8760 hours. CBG: No results for input(s): GLUCAP in the last 168 hours.  Radiological Exams on Admission: No results found.  EKG: Personally reviewed.   Assessment/Plan Principal Problem:   Symptomatic anemia Active Problems:   Melena   History of Mallory-Weiss tear   HTN (hypertension)   Hypokalemia   Acute upper GI bleed   History of Stroke   Leukocytosis   NSAID induced gastritis   Hyperlipidemia  1. Upper GI bleeding with chronic blood loss anemia- admit for IV Protonix, GI consult, stabilization with packed red blood cell transfusion.  Clear liquid diet is been started.  Supportive therapy orders written.  Holding all NSAIDs. 2. Chronic blood loss anemia- patient is being transfused 2 units of packed red blood cells.  Monitor CBC.  GI consult for consideration of  EGD. 3. History of Mallory-Weiss tear- GI planning for upper endoscopy when medically stabilized. 4. Essential hypertension-resume home blood pressure medications and follow. 5. Leukocytosis-no signs of infection found will initiate work-up and plan to recheck CBC with differential in the morning. 6. Hyperlipidemia-resume home medications- follow. 7. Hypokalemia-replacement has been ordered, check magnesium and replace if needed. 8. Melena-likely from upper GI bleeding-treatment noted above.  DVT Prophylaxis: SCDs Code Status: Full Family Communication: Bedside Disposition Plan: Inpatient medical care  Time spent: 57 minutes  Standley Dakins, MD [facility] Pager [phone]  If 7PM-7AM, please contact night-coverage [email or web address] Password [email or web address] 02/18/2018, 2:11 PM

## 2018-02-19 ENCOUNTER — Encounter (HOSPITAL_COMMUNITY): Payer: Self-pay | Admitting: Emergency Medicine

## 2018-02-19 ENCOUNTER — Other Ambulatory Visit: Payer: Self-pay

## 2018-02-19 ENCOUNTER — Encounter (HOSPITAL_COMMUNITY)
Admission: EM | Disposition: A | Discharge status: Home / Self Care | Payer: Self-pay | Source: Home / Self Care | Attending: Family Medicine

## 2018-02-19 DIAGNOSIS — K228 Other specified diseases of esophagus: Secondary | ICD-10-CM

## 2018-02-19 DIAGNOSIS — K269 Duodenal ulcer, unspecified as acute or chronic, without hemorrhage or perforation: Secondary | ICD-10-CM

## 2018-02-19 DIAGNOSIS — K3189 Other diseases of stomach and duodenum: Secondary | ICD-10-CM

## 2018-02-19 HISTORY — PX: ESOPHAGOGASTRODUODENOSCOPY: SHX5428

## 2018-02-19 LAB — BPAM RBC
BLOOD PRODUCT EXPIRATION DATE: 201906052359
BLOOD PRODUCT EXPIRATION DATE: 201906052359
ISSUE DATE / TIME: 201905092018
ISSUE DATE / TIME: 201905091638
UNIT TYPE AND RH: 6200
Unit Type and Rh: 6200

## 2018-02-19 LAB — BASIC METABOLIC PANEL
ANION GAP: 7 (ref 5–15)
BUN: 14 mg/dL (ref 6–20)
CO2: 24 mmol/L (ref 22–32)
Calcium: 8.7 mg/dL — ABNORMAL LOW (ref 8.9–10.3)
Chloride: 106 mmol/L (ref 101–111)
Creatinine, Ser: 0.98 mg/dL (ref 0.61–1.24)
GLUCOSE: 101 mg/dL — AB (ref 65–99)
POTASSIUM: 3.6 mmol/L (ref 3.5–5.1)
Sodium: 137 mmol/L (ref 135–145)

## 2018-02-19 LAB — CBC
HEMATOCRIT: 27 % — AB (ref 39.0–52.0)
Hemoglobin: 8.7 g/dL — ABNORMAL LOW (ref 13.0–17.0)
MCH: 26 pg (ref 26.0–34.0)
MCHC: 32.2 g/dL (ref 30.0–36.0)
MCV: 80.6 fL (ref 78.0–100.0)
Platelets: 275 10*3/uL (ref 150–400)
RBC: 3.35 MIL/uL — AB (ref 4.22–5.81)
RDW: 17.4 % — ABNORMAL HIGH (ref 11.5–15.5)
WBC: 11.3 10*3/uL — AB (ref 4.0–10.5)

## 2018-02-19 LAB — TYPE AND SCREEN
ABO/RH(D): A POS
Antibody Screen: NEGATIVE
UNIT DIVISION: 0
Unit division: 0

## 2018-02-19 LAB — HIV ANTIBODY (ROUTINE TESTING W REFLEX): HIV Screen 4th Generation wRfx: NONREACTIVE

## 2018-02-19 SURGERY — EGD (ESOPHAGOGASTRODUODENOSCOPY)
Anesthesia: Moderate Sedation

## 2018-02-19 MED ORDER — MEPERIDINE HCL 100 MG/ML IJ SOLN
INTRAMUSCULAR | Status: AC
  Filled 2018-02-19: qty 2

## 2018-02-19 MED ORDER — SODIUM CHLORIDE 0.9 % IV SOLN: INTRAVENOUS | Status: DC

## 2018-02-19 MED ORDER — ONDANSETRON HCL 4 MG/2ML IJ SOLN
INTRAMUSCULAR | Status: AC
  Filled 2018-02-19: qty 2

## 2018-02-19 MED ORDER — ONDANSETRON HCL 4 MG/2ML IJ SOLN
INTRAMUSCULAR | Status: DC | PRN
  Administered 2018-02-19: 4 mg via INTRAVENOUS

## 2018-02-19 MED ORDER — POTASSIUM CHLORIDE 10 MEQ/100ML IV SOLN
10.0000 meq | INTRAVENOUS | Status: AC
  Administered 2018-02-19 (×5): 10 meq via INTRAVENOUS
  Filled 2018-02-19 (×4): qty 100

## 2018-02-19 MED ORDER — MIDAZOLAM HCL 5 MG/5ML IJ SOLN
INTRAMUSCULAR | Status: AC
  Filled 2018-02-19: qty 10

## 2018-02-19 MED ORDER — LIDOCAINE VISCOUS 2 % MT SOLN
OROMUCOSAL | Status: AC
  Filled 2018-02-19: qty 15

## 2018-02-19 MED ORDER — LIDOCAINE VISCOUS 2 % MT SOLN
OROMUCOSAL | Status: DC | PRN
  Administered 2018-02-19: 1 via OROMUCOSAL

## 2018-02-19 MED ORDER — MIDAZOLAM HCL 5 MG/5ML IJ SOLN
INTRAMUSCULAR | Status: DC | PRN
  Administered 2018-02-19: 1 mg via INTRAVENOUS
  Administered 2018-02-19: 2 mg via INTRAVENOUS

## 2018-02-19 MED ORDER — MEPERIDINE HCL 100 MG/ML IJ SOLN
INTRAMUSCULAR | Status: DC | PRN
  Administered 2018-02-19: 50 mg via INTRAVENOUS
  Administered 2018-02-19: 25 mg via INTRAVENOUS

## 2018-02-19 NOTE — Progress Notes (Addendum)
PROGRESS NOTE    Scott Ritter  ZOX:[id number]  DOB: 03/14/1961  DOA: 02/18/2018 PCP: Wilson Singer, MD   Brief Admission Hx: Scott Ritter is a 57 y.o. male with a past medical history of Mallory-Weiss tear, NSAID induced gastritis and hypertension, prior CVA, hematemesis presented to the emergency department complaining of progressive shortness of breath over the past several days.  He was admitted with severe symptomatic anemia.   MDM/Assessment & Plan:   1. Upper GI bleeding with chronic blood loss anemia- admit for IV Protonix, GI consult, stabilization with packed red blood cell transfusion.  Holding all NSAIDs.  He had EGD this morning and it showed erosive gastropathy and duodenal erosions, continue daily oral pantoprazole. 2. Chronic blood loss anemia- patient was transfused 2 units of packed red blood cells.  Monitor CBC.  3. History of Mallory-Weiss tear- GI completed upper endoscopy - see findings below.  4. Essential hypertension-resumed home blood pressure medications and follow. 5. Leukocytosis-no signs of infection found. Improving. 6. Hyperlipidemia-resumed home medications. 7. Hypokalemia-repleted.  8. Melena-likely from upper GI bleeding-treatment noted above.  DVT Prophylaxis: SCDs Code Status: Full Family Communication: Bedside Disposition Plan: Discharge in 24 hours   Consultants:  GI  Procedures:  EGD 02/19/18 Impression:      - Mucosal changes in the esophagus - query segment Barrett's. Biopsied.                           - Erosive gastropathy. Biopsied.                           - Duodenal erosions. I suspect NSAID insult                            produced clinical presentation. Other occult NSAID                            lesions further down in the small bowel not                            excluded. Colonoscopy findings 2 years ago                            reassuring. Moderate Sedation:      Moderate (conscious) sedation was  administered by the endoscopy nurse       and supervised by the endoscopist. The following parameters were       monitored: oxygen saturation, heart rate, blood pressure, respiratory       rate, EKG, adequacy of pulmonary ventilation, and response to care.       Total physician intraservice time was 15 minutes. Recommendation:           - Return patient to hospital ward for ongoing care.                           - Advance diet as tolerated. Continue once daily                            PPI therapy. Avoid all nonsteroidal agents over  above 181 mg aspirin daily.                           - Continue present medications.                           - Await pathology results.                           - No repeat upper endoscopy. From a GI standpoint,                            patient could probably be discharged within the                            next 24 hours.                           - Return to GI office in 6 weeks. Patient needs to                            be followed closely as an outpatient to assure                            hemoglobin returns to normal. If not, further                            evaluation may be needed.   Subjective: Pt reports that he feels a lot better after blood transfusion.    Objective: Vitals:   02/19/18 1120 02/19/18 1125 02/19/18 1130 02/19/18 1152  BP: (!) 160/139 (!) 126/59 120/62 (!) 147/53  Pulse: 71 72 61 63  Resp: Temp:    97.8 F (36.6 C)  TempSrc:    Oral  SpO2: 97% 90% 96% 100%  Weight:      Height:        Intake/Output Summary (Last 24 hours) at 02/19/2018 1220 Last data filed at 02/19/2018 0934 Gross per 24 hour  Intake 2079.17 ml  Output 2800 ml  Net -720.83 ml   Filed Weights   02/18/18 1134 02/19/18 1019  Weight: 97.1 kg (214 lb) 97.1 kg (214 lb)     REVIEW OF SYSTEMS  As per history otherwise all reviewed and reported negative  Exam:  General exam: awake, alert, NAD.  Cooperative.   Respiratory system: Clear. No increased work of breathing. Cardiovascular system: S1 & S2 heard, RRR. No JVD, murmurs, gallops, clicks or pedal edema. Gastrointestinal system: Abdomen is nondistended, soft and nontender. Normal bowel sounds heard. Central nervous system: Alert and oriented. No focal neurological deficits. Extremities: no CCE.  Data Reviewed: Basic Metabolic Panel: Recent Labs  Lab 02/18/18 1230 02/18/18 1231 02/19/18 0628  NA 143  --  137  K 3.2*  --  3.6  CL 111  --  106  CO2 25  --  24  GLUCOSE 108*  --  101*  BUN 26*  --  14  CREATININE 1.19  --  0.98  CALCIUM 9.4  --  8.7*  MG  --  1.7  --    Liver Function Tests:  No results for input(s): AST, ALT, ALKPHOS, BILITOT, PROT, ALBUMIN in the last 168 hours. No results for input(s): LIPASE, AMYLASE in the last 168 hours. No results for input(s): AMMONIA in the last 168 hours. CBC: Recent Labs  Lab 02/18/18 1230 02/19/18 0628  WBC 14.0* 11.3*  NEUTROABS 8.9*  --   HGB 6.1* 8.7*  HCT 19.7* 27.0*  MCV 77.3* 80.6  PLT 292 275   Cardiac Enzymes: Recent Labs  Lab 02/18/18 1230  TROPONINI <0.03   CBG (last 3)  No results for input(s): GLUCAP in the last 72 hours. No results found for this or any previous visit (from the past 240 hour(s)).   Studies: Dg Chest 2 View  Result Date: 02/18/2018 CLINICAL DATA:  57 y/o M; 2 days of shortness of breath and weakness. EXAM: CHEST - 2 VIEW COMPARISON:  12/16/2016 chest radiograph FINDINGS: Normal cardiac silhouette. Aortic atherosclerosis with calcification. Clear lungs. No pleural effusion or pneumothorax. No acute osseous abnormality is evident. IMPRESSION: No acute pulmonary process identified. Electronically Signed   By: Mitzi Hansen M.D.   On: 02/18/2018 14:31     Scheduled Meds: . atorvastatin  20 mg Oral Daily  . cholecalciferol  200 Units Oral Daily  . hydrALAZINE  25 mg Oral Q8H  . losartan  100 mg Oral Daily  .  pantoprazole  40 mg Oral Daily   Continuous Infusions: . sodium chloride 50 mL/hr at 02/19/18 1158    Principal Problem:   Symptomatic anemia Active Problems:   Melena   History of Mallory-Weiss tear   HTN (hypertension)   Hypokalemia   Acute upper GI bleed   History of Stroke   Leukocytosis   NSAID induced gastritis   Hyperlipidemia   Gastrointestinal hemorrhage with melena  Time spent:   Standley Dakins, MD, FAAFP [facility] Pager [phone]  If 7PM-7AM, please contact night-coverage [email or web address] Password [email or web address] 02/19/2018, 12:20 PM    LOS: 1 day

## 2018-02-19 NOTE — Plan of Care (Deleted)
Pt currently afebrile. Iv site within defined parameters. No s/s of infection noted at current time. Hg currently 8.5 up from 6.1 after receiving 2 units of PRBCS on 02/18/18.

## 2018-02-19 NOTE — Progress Notes (Signed)
Patient seen in endoscopy. He remains hemodynamically stable hemoglobin 8.7 this morning after 2 units of packed RBCs. EGD now being performed.  The risks, benefits, limitations, alternatives and imponderables have been reviewed with the patient. Potential for esophageal dilation, biopsy, etc. have also been reviewed.  Questions have been answered. All parties agreeable.  Further recommendations to follow.

## 2018-02-19 NOTE — Op Note (Signed)
[facility] Patient Name: Scott Ritter Procedure Date: 02/19/2018 10:22 AM MRN: [id number] Date of Birth: 1961/01/18 Attending MD: Gennette Pac , MD CSN: [id number] Age: 57 Admit Type: Inpatient Procedure:                Upper GI endoscopy Indications:              Melena Providers:                Gennette Pac, MD, Nena Polio, RN, Dyann Ruddle Referring MD:              Medicines:                Midazolam 3 mg IV, Meperidine 75 mg IV Complications:            No immediate complications. Estimated Blood Loss:     Estimated blood loss was minimal. Procedure:                Pre-Anesthesia Assessment:                           - Prior to the procedure, a History and Physical                            was performed, and patient medications and                            allergies were reviewed. The patient's tolerance of                            previous anesthesia was also reviewed. The risks                            and benefits of the procedure and the sedation                            options and risks were discussed with the patient.                            All questions were answered, and informed consent                            was obtained. Prior Anticoagulants: The patient has                            taken no previous anticoagulant or antiplatelet                            agents. ASA Grade Assessment: II - A patient with                            mild systemic disease. After reviewing the risks  and benefits, the patient was deemed in                            satisfactory condition to undergo the procedure.                           After obtaining informed consent, the endoscope was                            passed under direct vision. Throughout the                            procedure, the patient's blood pressure, pulse, and                            oxygen saturations were  monitored continuously. The                            EG-[id number] ([id number]) scope was introduced through the                            mouth, and advanced to the second part of duodenum.                            The upper GI endoscopy was accomplished without                            difficulty. The patient tolerated the procedure                            well. Scope In: 11:08:25 AM Scope Out: 11:17:25 AM Total Procedure Duration: 0 hours 9 minutes 0 seconds  Findings:      Mucosal changes were found in the esophagus. (3) 1-2 cm "tongues" of       salmon-colored epithelium at the GE junction. This was biopsied with a       cold forceps.      Multiple erosions were found in the gastric antrum. This was biopsied       with a cold forceps for histology.      A few erosions were found in the duodenal bulb. No ulcer or infiltrating       process found in the upper GI tract. Impression:               - Mucosal changes in the esophagus - query short                            segment Barrett's. Biopsied.                           - Erosive gastropathy. Biopsied.                           - Duodenal erosions. I suspect NSAID insult  produced clinical presentation. Other occult NSAID                            lesions further down in the small bowel not                            excluded. Colonoscopy findings 2 years ago                            reassuring. Moderate Sedation:      Moderate (conscious) sedation was administered by the endoscopy nurse       and supervised by the endoscopist. The following parameters were       monitored: oxygen saturation, heart rate, blood pressure, respiratory       rate, EKG, adequacy of pulmonary ventilation, and response to care.       Total physician intraservice time was 15 minutes. Recommendation:           - Return patient to hospital ward for ongoing care.                           - Advance diet as tolerated. Continue  once daily                            PPI therapy. Avoid all nonsteroidal agents over                            above 181 mg aspirin daily.                           - Continue present medications.                           - Await pathology results.                           - No repeat upper endoscopy. From a GI standpoint,                            patient could probably be discharged within the                            next 24 hours.                           - Return to GI office in 6 weeks. Patient needs to                            be followed closely as an outpatient to assure                            hemoglobin returns to normal. If not, further                            evaluation may be needed. Procedure Code(s):        ---  Professional ---                           [phone], Esophagogastroduodenoscopy, flexible,                            transoral; with biopsy, single or multiple                           G0500, Moderate sedation services provided by the                            same physician or other qualified health care                            professional performing a gastrointestinal                            endoscopic service that sedation supports,                            requiring the presence of an independent trained                            observer to assist in the monitoring of the                            patient's level of consciousness and physiological                            status; initial 15 minutes of intra-service time;                            patient age 41 years or older (additional time may                            be reported with 46962, as appropriate) Diagnosis Code(s):        --- Professional ---                           K22.8, Other specified diseases of esophagus                           K31.89, Other diseases of stomach and duodenum                           K26.9, Duodenal ulcer, unspecified as acute or                             chronic, without hemorrhage or perforation                           K92.1, Melena (includes Hematochezia) CPT copyright 2017 [employer]. All rights reserved. The codes documented in this report are preliminary and upon coder review may  be revised to meet current  compliance requirements. Gerrit Friends. , MD Gennette Pac, MD 02/19/2018 11:29:02 AM This report has been signed electronically. Number of Addenda: 0

## 2018-02-20 DIAGNOSIS — E785 Hyperlipidemia, unspecified: Secondary | ICD-10-CM

## 2018-02-20 LAB — BASIC METABOLIC PANEL
ANION GAP: 6 (ref 5–15)
BUN: 17 mg/dL (ref 6–20)
CALCIUM: 8.5 mg/dL — AB (ref 8.9–10.3)
CO2: 25 mmol/L (ref 22–32)
Chloride: 106 mmol/L (ref 101–111)
Creatinine, Ser: 1.2 mg/dL (ref 0.61–1.24)
GFR calc Af Amer: >60 mL/min (ref >60)
GLUCOSE: 99 mg/dL (ref 65–99)
POTASSIUM: 3.6 mmol/L (ref 3.5–5.1)
SODIUM: 137 mmol/L (ref 135–145)

## 2018-02-20 LAB — CBC
HCT: 27.9 % — ABNORMAL LOW (ref 39.0–52.0)
Hemoglobin: 8.7 g/dL — ABNORMAL LOW (ref 13.0–17.0)
MCH: 25.5 pg — ABNORMAL LOW (ref 26.0–34.0)
MCHC: 31.2 g/dL (ref 30.0–36.0)
MCV: 81.8 fL (ref 78.0–100.0)
PLATELETS: 282 10*3/uL (ref 150–400)
RBC: 3.41 MIL/uL — ABNORMAL LOW (ref 4.22–5.81)
RDW: 17.7 % — AB (ref 11.5–15.5)
WBC: 10.8 10*3/uL — AB (ref 4.0–10.5)

## 2018-02-20 MED ORDER — FERROUS SULFATE 325 (65 FE) MG PO TBEC: 325.0000 mg | DELAYED_RELEASE_TABLET | Freq: Two times a day (BID) | ORAL | 3 refills | Status: DC

## 2018-02-20 MED ORDER — PANTOPRAZOLE SODIUM 40 MG PO TBEC: 40.0000 mg | DELAYED_RELEASE_TABLET | Freq: Every day | ORAL | 0 refills | Status: DC

## 2018-02-20 NOTE — Progress Notes (Signed)
Patient says he feels good. Tolerating diet. No abdominal pain. No melena.  Vital signs in last 24 hours: Temp:  [97.8 F (36.6 C)-98.4 F (36.9 C)] 98.4 F (36.9 C) (05/11 0517) Pulse Rate:  [61-73] 62 (05/11 0517) Resp:  [12-17] 17 (05/11 0517) BP: (120-182)/(53-139) 163/87 (05/11 0517) SpO2:  [90 %-100 %] 97 % (05/11 0752) Weight:  [214 lb (97.1 kg)] 214 lb (97.1 kg) (05/10 1019) Last BM Date: 02/18/18 General:   Alert,   pleasant and cooperative in NAD Abdomen:  Soft, nontender and nondistended.  Normal bowel sounds, without guarding, and without rebound.  No mass or organomegaly. Extremities:  Without clubbing or edema.    Intake/Output from previous day: 05/10 0701 - 05/11 0700 In: 1049.2 [P.O.:600; I.V.:449.2] Out: 800 [Urine:800] Intake/Output this shift: No intake/output data recorded.  Lab Results: Recent Labs    02/18/18 1230 02/19/18 0628 02/20/18 0457  WBC 14.0* 11.3* 10.8*  HGB 6.1* 8.7* 8.7*  HCT 19.7* 27.0* 27.9*  PLT 292 275 282   BMET Recent Labs    02/18/18 1230 02/19/18 0628 02/20/18 0457  NA 143 137 137  K 3.2* 3.6 3.6  CL 111 106 106  CO2 GLUCOSE 108* 101* 99  BUN 26* 14 17  CREATININE 1.19 0.98 1.20  CALCIUM 9.4 8.7* 8.5*   LFT No results for input(s): PROT, ALBUMIN, AST, ALT, ALKPHOS, BILITOT, BILIDIR, IBILI in the last 72 hours. PT/INR No results for input(s): LABPROT, INR in the last 72 hours. Hepatitis Panel No results for input(s): HEPBSAG, HCVAB, HEPAIGM, HEPBIGM in the last 72 hours. C-Diff No results for input(s): CDIFFTOX in the last 72 hours.  Studies/Results: Dg Chest 2 View  Result Date: 02/18/2018 CLINICAL DATA:  57 y/o M; 2 days of shortness of breath and weakness. EXAM: CHEST - 2 VIEW COMPARISON:  12/16/2016 chest radiograph FINDINGS: Normal cardiac silhouette. Aortic atherosclerosis with calcification. Clear lungs. No pleural effusion or pneumothorax. No acute osseous abnormality is evident. IMPRESSION: No  acute pulmonary process identified. Electronically Signed   By: Mitzi Hansen M.D.   On: 02/18/2018 14:31    Impression:  GI bleed secondary to NSAID gastropathy/enteropathy. Bleeding seems to have ceased. Hemoglobin stable.  Recommendations: Absolutely refrain from all forms NSAID ASA products for now.  Continue PPI once daily.  Short course of oral iron therapy.  Follow-up on pathology. Will arrange close interval follow-up in the office to assure he is doing well and follow his H&H back to the normal range. Further evaluation GI bleeding may or may not be needed as an outpatient. From a GI standpoint, could be discharged anytime.

## 2018-02-20 NOTE — Discharge Summary (Signed)
Physician Discharge Summary  Scott Ritter ZOX:[id number] DOB: Oct 04, 1961 DOA: 02/18/2018  PCP: Wilson Singer, MD  Admit date: 02/18/2018 Discharge date: 02/20/2018  Admitted From: Home Disposition: Home  Recommendations for Outpatient Follow-up:  1. Follow up with PCP in 1-2 weeks 2. Please obtain BMP/CBC in one week 3. Follow-up with GI as an outpatient  Home Health: Equipment/Devices:  Discharge Condition: Stable CODE STATUS: Full code Diet recommendation: Heart Healthy    Brief/Interim Summary: 57 year old male with a history of Mallory-Weiss tear, NSAID induced gastritis and hypertension, admitted to the hospital with hematemesis.  He had progressive shortness of breath over the last several days prior to admission.  He was admitted with severe symptomatic anemia.  On admission, hemoglobin noted to be low at 6.1.  He was transfused 2 units of PRBC with improvement of hemoglobin 8.7.  Follow-up hemoglobin remained stable.  He was seen by gastroenterology who performed endoscopy.  It was felt that his anemia was related to NSAID induced gastric injury.  He was advised to refrain from any further NSAID/aspirin use.  He is continued on PPI daily.  Patient is feeling significantly better and feels ready for discharge home.  His hemoglobin has remained stable.  Arrangements will be made by GI service for outpatient follow-up.  Discharge Diagnoses:  Principal Problem:   Symptomatic anemia Active Problems:   History of Mallory-Weiss tear   HTN (hypertension)   Hypokalemia   Acute upper GI bleed   Melena   History of Stroke   Leukocytosis   NSAID induced gastritis   Hyperlipidemia   Gastrointestinal hemorrhage with melena    Discharge Instructions  Discharge Instructions    Diet - low sodium heart healthy   Complete by:  As directed    Increase activity slowly   Complete by:  As directed      Allergies as of 02/20/2018   No Known Allergies     Medication List     STOP taking these medications   amoxicillin 500 MG capsule Commonly known as:  AMOXIL   aspirin EC 81 MG tablet   ibuprofen 800 MG tablet Commonly known as:  ADVIL,MOTRIN   losartan 100 MG tablet Commonly known as:  COZAAR     TAKE these medications   amLODipine 10 MG tablet Commonly known as:  NORVASC Take 10 mg by mouth daily.   atorvastatin 20 MG tablet Commonly known as:  LIPITOR Take 20 mg by mouth daily.   ferrous sulfate 325 (65 FE) MG EC tablet Take 1 tablet (325 mg total) by mouth 2 (two) times daily.   hydrALAZINE 25 MG tablet Commonly known as:  APRESOLINE Take 1 tablet by mouth daily. What changed:  Another medication with the same name was removed. Continue taking this medication, and follow the directions you see here.   losartan-hydrochlorothiazide 100-25 MG tablet Commonly known as:  HYZAAR Take 1 tablet by mouth daily.   pantoprazole 40 MG tablet Commonly known as:  PROTONIX Take 1 tablet (40 mg total) by mouth daily. Start taking on:  02/21/2018   potassium chloride SA 20 MEQ tablet Commonly known as:  K-DUR,KLOR-CON Take 1 tablet (20 mEq total) by mouth daily.   Vitamin D 2000 units Caps Take 2,000 Units by mouth daily.       No Known Allergies  Consultations:  Gastroenterology   Procedures/Studies: Dg Chest 2 View  Result Date: 02/18/2018 CLINICAL DATA:  57 y/o M; 2 days of shortness of breath and weakness. EXAM: CHEST -  2 VIEW COMPARISON:  12/16/2016 chest radiograph FINDINGS: Normal cardiac silhouette. Aortic atherosclerosis with calcification. Clear lungs. No pleural effusion or pneumothorax. No acute osseous abnormality is evident. IMPRESSION: No acute pulmonary process identified. Electronically Signed   By: Mitzi Hansen M.D.   On: 02/18/2018 14:31    EGD:   - Mucosal changes in the esophagus - query short                            segment Barrett's. Biopsied.                           - Erosive gastropathy.  Biopsied.                           - Duodenal erosions. I suspect NSAID insult                            produced clinical presentation. Other occult NSAID                            lesions further down in the small bowel not                            excluded. Colonoscopy findings 2 years ago                            reassuring     Subjective: Feeling better.  No dizziness or lightheadedness.  No chest pain or shortness of breath.  No abdominal pain.  Discharge Exam: Vitals:   02/20/18 0517 02/20/18 0752  BP: (!) 163/87   Pulse: 62   Resp: 17   Temp: 98.4 F (36.9 C)   SpO2: 98% 97%   Vitals:   02/19/18 1302 02/19/18 2048 02/20/18 0517 02/20/18 0752  BP: (!) 158/77 (!) 161/74 (!) 163/87   Pulse: 61 68 62   Resp: 16  17   Temp: 98 F (36.7 C) 98.2 F (36.8 C) 98.4 F (36.9 C)   TempSrc: Oral Oral Oral   SpO2: 99% 98% 98% 97%  Weight:      Height:        General: Pt is alert, awake, not in acute distress Cardiovascular: RRR, S1/S2 +, no rubs, no gallops Respiratory: CTA bilaterally, no wheezing, no rhonchi Abdominal: Soft, NT, ND, bowel sounds + Extremities: no edema, no cyanosis    The results of significant diagnostics from this hospitalization (including imaging, microbiology, ancillary and laboratory) are listed below for reference.     Microbiology: No results found for this or any previous visit (from the past 240 hour(s)).   Labs: BNP (last 3 results) Recent Labs    02/18/18 1230  BNP 283.0*   Basic Metabolic Panel: Recent Labs  Lab 02/18/18 1230 02/18/18 1231 02/19/18 0628 02/20/18 0457  NA 143  --  137 137  K 3.2*  --  3.6 3.6  CL 111  --  106 106  CO2 25  --  24 25  GLUCOSE 108*  --  101* 99  BUN 26*  --  14 17  CREATININE 1.19  --  0.98 1.20  CALCIUM 9.4  --  8.7* 8.5*  MG  --  1.7  --   --    Liver Function Tests: No results for input(s): AST, ALT, ALKPHOS, BILITOT, PROT, ALBUMIN in the last 168 hours. No results for  input(s): LIPASE, AMYLASE in the last 168 hours. No results for input(s): AMMONIA in the last 168 hours. CBC: Recent Labs  Lab 02/18/18 1230 02/19/18 0628 02/20/18 0457  WBC 14.0* 11.3* 10.8*  NEUTROABS 8.9*  --   --   HGB 6.1* 8.7* 8.7*  HCT 19.7* 27.0* 27.9*  MCV 77.3* 80.6 81.8  PLT 292 275 282   Cardiac Enzymes: Recent Labs  Lab 02/18/18 1230  TROPONINI <0.03   BNP: Invalid input(s): POCBNP CBG: No results for input(s): GLUCAP in the last 168 hours. D-Dimer No results for input(s): DDIMER in the last 72 hours. Hgb A1c No results for input(s): HGBA1C in the last 72 hours. Lipid Profile No results for input(s): CHOL, HDL, LDLCALC, TRIG, CHOLHDL, LDLDIRECT in the last 72 hours. Thyroid function studies No results for input(s): TSH, T4TOTAL, T3FREE, THYROIDAB in the last 72 hours.  Invalid input(s): FREET3 Anemia work up Recent Labs    02/18/18 1240 02/18/18 1415  VITAMINB12 159*  --   FOLATE  --  9.0  FERRITIN 3*  --   TIBC 391  --   IRON 43*  --   RETICCTPCT  --  4.9*   Urinalysis    Component Value Date/Time   COLORURINE STRAW (A) 02/18/2018 1700   APPEARANCEUR CLEAR 02/18/2018 1700   LABSPEC 1.012 02/18/2018 1700   PHURINE 7.0 02/18/2018 1700   GLUCOSEU NEGATIVE 02/18/2018 1700   HGBUR NEGATIVE 02/18/2018 1700   BILIRUBINUR NEGATIVE 02/18/2018 1700   KETONESUR NEGATIVE 02/18/2018 1700   PROTEINUR NEGATIVE 02/18/2018 1700   UROBILINOGEN 1.0 11/01/2008 1425   NITRITE NEGATIVE 02/18/2018 1700   LEUKOCYTESUR NEGATIVE 02/18/2018 1700   Sepsis Labs Invalid input(s): PROCALCITONIN,  WBC,  LACTICIDVEN Microbiology No results found for this or any previous visit (from the past 240 hour(s)).   Time coordinating discharge:  SIGNED:   Erick Blinks, MD  [facility] 02/20/2018, 6:29 PM Pager   If 7PM-7AM, please contact night-coverage [email or web address] Password [email or web address]

## 2018-02-20 NOTE — Progress Notes (Signed)
Removed IV-clean, dry, intact. Reviewed d/c paperwork with patient. Discussed new/changes of medications. Answered all questions. Wheeled stable patient and belongings to main entrance.

## 2018-02-20 NOTE — Plan of Care (Signed)
progressing 

## 2018-02-23 ENCOUNTER — Encounter: Payer: Self-pay | Admitting: Internal Medicine

## 2018-02-23 ENCOUNTER — Encounter (HOSPITAL_COMMUNITY): Payer: Self-pay | Admitting: Internal Medicine

## 2018-03-31 DIAGNOSIS — D649 Anemia, unspecified: Secondary | ICD-10-CM | POA: Diagnosis not present

## 2018-03-31 DIAGNOSIS — M79673 Pain in unspecified foot: Secondary | ICD-10-CM | POA: Diagnosis not present

## 2018-03-31 DIAGNOSIS — I10 Essential (primary) hypertension: Secondary | ICD-10-CM | POA: Diagnosis not present

## 2018-04-26 ENCOUNTER — Ambulatory Visit: Payer: Medicare HMO | Admitting: Nurse Practitioner

## 2018-08-16 DIAGNOSIS — E559 Vitamin D deficiency, unspecified: Secondary | ICD-10-CM | POA: Diagnosis not present

## 2018-08-16 DIAGNOSIS — Z23 Encounter for immunization: Secondary | ICD-10-CM | POA: Diagnosis not present

## 2018-08-16 DIAGNOSIS — E7849 Other hyperlipidemia: Secondary | ICD-10-CM | POA: Diagnosis not present

## 2018-08-16 DIAGNOSIS — I10 Essential (primary) hypertension: Secondary | ICD-10-CM | POA: Diagnosis not present

## 2018-08-16 DIAGNOSIS — D649 Anemia, unspecified: Secondary | ICD-10-CM | POA: Diagnosis not present

## 2018-08-17 ENCOUNTER — Encounter (HOSPITAL_COMMUNITY): Payer: Self-pay | Admitting: Emergency Medicine

## 2018-08-17 ENCOUNTER — Observation Stay (HOSPITAL_COMMUNITY)
Admission: EM | Admit: 2018-08-17 | Discharge: 2018-08-19 | Disposition: A | Discharge status: Home / Self Care | Payer: Medicare HMO | Attending: Internal Medicine | Admitting: Internal Medicine

## 2018-08-17 ENCOUNTER — Other Ambulatory Visit: Payer: Self-pay

## 2018-08-17 DIAGNOSIS — K6389 Other specified diseases of intestine: Secondary | ICD-10-CM | POA: Insufficient documentation

## 2018-08-17 DIAGNOSIS — K449 Diaphragmatic hernia without obstruction or gangrene: Secondary | ICD-10-CM | POA: Diagnosis not present

## 2018-08-17 DIAGNOSIS — D509 Iron deficiency anemia, unspecified: Secondary | ICD-10-CM

## 2018-08-17 DIAGNOSIS — Z8673 Personal history of transient ischemic attack (TIA), and cerebral infarction without residual deficits: Secondary | ICD-10-CM | POA: Insufficient documentation

## 2018-08-17 DIAGNOSIS — D649 Anemia, unspecified: Secondary | ICD-10-CM | POA: Diagnosis not present

## 2018-08-17 DIAGNOSIS — I10 Essential (primary) hypertension: Secondary | ICD-10-CM | POA: Diagnosis present

## 2018-08-17 DIAGNOSIS — E876 Hypokalemia: Secondary | ICD-10-CM | POA: Diagnosis not present

## 2018-08-17 DIAGNOSIS — D62 Acute posthemorrhagic anemia: Principal | ICD-10-CM | POA: Insufficient documentation

## 2018-08-17 DIAGNOSIS — K226 Gastro-esophageal laceration-hemorrhage syndrome: Secondary | ICD-10-CM | POA: Diagnosis present

## 2018-08-17 DIAGNOSIS — Q2733 Arteriovenous malformation of digestive system vessel: Secondary | ICD-10-CM | POA: Diagnosis not present

## 2018-08-17 DIAGNOSIS — D500 Iron deficiency anemia secondary to blood loss (chronic): Secondary | ICD-10-CM

## 2018-08-17 DIAGNOSIS — Z8719 Personal history of other diseases of the digestive system: Secondary | ICD-10-CM | POA: Insufficient documentation

## 2018-08-17 DIAGNOSIS — Z8249 Family history of ischemic heart disease and other diseases of the circulatory system: Secondary | ICD-10-CM | POA: Insufficient documentation

## 2018-08-17 DIAGNOSIS — E785 Hyperlipidemia, unspecified: Secondary | ICD-10-CM | POA: Diagnosis not present

## 2018-08-17 DIAGNOSIS — K922 Gastrointestinal hemorrhage, unspecified: Secondary | ICD-10-CM | POA: Diagnosis present

## 2018-08-17 DIAGNOSIS — R0602 Shortness of breath: Secondary | ICD-10-CM | POA: Diagnosis present

## 2018-08-17 DIAGNOSIS — Z79899 Other long term (current) drug therapy: Secondary | ICD-10-CM | POA: Insufficient documentation

## 2018-08-17 LAB — COMPREHENSIVE METABOLIC PANEL
ALBUMIN: 4 g/dL (ref 3.5–5.0)
ALT: 12 U/L (ref 0–44)
ANION GAP: 10 (ref 5–15)
AST: 18 U/L (ref 15–41)
Alkaline Phosphatase: 80 U/L (ref 38–126)
BILIRUBIN TOTAL: 0.6 mg/dL (ref 0.3–1.2)
BUN: 16 mg/dL (ref 6–20)
CALCIUM: 9 mg/dL (ref 8.9–10.3)
CO2: 23 mmol/L (ref 22–32)
Chloride: 104 mmol/L (ref 98–111)
Creatinine, Ser: 1.35 mg/dL — ABNORMAL HIGH (ref 0.61–1.24)
GFR calc Af Amer: >60 mL/min (ref >60)
GFR calc non Af Amer: 58 mL/min — ABNORMAL LOW (ref >60)
GLUCOSE: 91 mg/dL (ref 70–99)
POTASSIUM: 3.4 mmol/L — AB (ref 3.5–5.1)
Sodium: 137 mmol/L (ref 135–145)
TOTAL PROTEIN: 7.7 g/dL (ref 6.5–8.1)

## 2018-08-17 LAB — CBC WITH DIFFERENTIAL/PLATELET
Abs Immature Granulocytes: 0.04 10*3/uL (ref 0.00–0.07)
BASOS ABS: 0.1 10*3/uL (ref 0.0–0.1)
Basophils Relative: 1 %
EOS ABS: 0.3 10*3/uL (ref 0.0–0.5)
EOS PCT: 3 %
HEMATOCRIT: 24.6 % — AB (ref 39.0–52.0)
Hemoglobin: 7.2 g/dL — ABNORMAL LOW (ref 13.0–17.0)
Immature Granulocytes: 0 %
LYMPHS ABS: 1.9 10*3/uL (ref 0.7–4.0)
LYMPHS PCT: 15 %
MCH: 21.6 pg — ABNORMAL LOW (ref 26.0–34.0)
MCHC: 29.3 g/dL — AB (ref 30.0–36.0)
MCV: 73.9 fL — AB (ref 80.0–100.0)
MONO ABS: 1 10*3/uL (ref 0.1–1.0)
MONOS PCT: 9 %
NRBC: 0 % (ref 0.0–0.2)
Neutro Abs: 8.8 10*3/uL — ABNORMAL HIGH (ref 1.7–7.7)
Neutrophils Relative %: 72 %
Platelets: 387 10*3/uL (ref 150–400)
RBC: 3.33 MIL/uL — ABNORMAL LOW (ref 4.22–5.81)
RDW: 17.3 % — AB (ref 11.5–15.5)
WBC: 12.2 10*3/uL — ABNORMAL HIGH (ref 4.0–10.5)

## 2018-08-17 LAB — RETICULOCYTES
IMMATURE RETIC FRACT: 38.3 % — AB (ref 2.3–15.9)
RBC.: 3.33 MIL/uL — ABNORMAL LOW (ref 4.22–5.81)
RETIC COUNT ABSOLUTE: 73.9 10*3/uL (ref 19.0–186.0)
Retic Ct Pct: 2.2 % (ref 0.4–3.1)

## 2018-08-17 LAB — POC OCCULT BLOOD, ED: Fecal Occult Bld: POSITIVE — AB

## 2018-08-17 LAB — MAGNESIUM: Magnesium: 2 mg/dL (ref 1.7–2.4)

## 2018-08-17 LAB — IRON AND TIBC
IRON: 13 ug/dL — AB (ref 45–182)
SATURATION RATIOS: 3 % — AB (ref 17.9–39.5)
TIBC: 441 ug/dL (ref 250–450)
UIBC: 428 ug/dL

## 2018-08-17 LAB — PREPARE RBC (CROSSMATCH)

## 2018-08-17 LAB — VITAMIN B12: VITAMIN B 12: 256 pg/mL (ref 180–914)

## 2018-08-17 LAB — FOLATE: FOLATE: 9.7 ng/mL (ref >5.9)

## 2018-08-17 LAB — FERRITIN: Ferritin: 3 ng/mL — ABNORMAL LOW (ref 24–336)

## 2018-08-17 MED ORDER — ONDANSETRON HCL 4 MG/2ML IJ SOLN: 4.0000 mg | Freq: Four times a day (QID) | INTRAMUSCULAR | Status: DC | PRN

## 2018-08-17 MED ORDER — POTASSIUM CHLORIDE CRYS ER 20 MEQ PO TBCR
20.0000 meq | EXTENDED_RELEASE_TABLET | Freq: Every day | ORAL | Status: DC
  Administered 2018-08-18 – 2018-08-19 (×2): 20 meq via ORAL
  Filled 2018-08-17 (×2): qty 1

## 2018-08-17 MED ORDER — ACETAMINOPHEN 325 MG PO TABS: 650.0000 mg | ORAL_TABLET | Freq: Four times a day (QID) | ORAL | Status: DC | PRN

## 2018-08-17 MED ORDER — HYDRALAZINE HCL 20 MG/ML IJ SOLN
10.0000 mg | INTRAMUSCULAR | Status: DC | PRN
  Administered 2018-08-18: 10 mg via INTRAVENOUS
  Filled 2018-08-17: qty 1

## 2018-08-17 MED ORDER — AMLODIPINE BESYLATE 5 MG PO TABS
10.0000 mg | ORAL_TABLET | Freq: Every day | ORAL | Status: DC
  Administered 2018-08-18 – 2018-08-19 (×2): 10 mg via ORAL
  Filled 2018-08-17 (×2): qty 2

## 2018-08-17 MED ORDER — HYDRALAZINE HCL 25 MG PO TABS
50.0000 mg | ORAL_TABLET | Freq: Three times a day (TID) | ORAL | Status: DC
  Administered 2018-08-17 – 2018-08-19 (×4): 50 mg via ORAL
  Filled 2018-08-17 (×5): qty 2

## 2018-08-17 MED ORDER — ONDANSETRON HCL 4 MG PO TABS: 4.0000 mg | ORAL_TABLET | Freq: Four times a day (QID) | ORAL | Status: DC | PRN

## 2018-08-17 MED ORDER — SODIUM CHLORIDE 0.9 % IV SOLN: 10.0000 mL/h | Freq: Once | INTRAVENOUS | Status: DC

## 2018-08-17 MED ORDER — ACETAMINOPHEN 650 MG RE SUPP: 650.0000 mg | Freq: Four times a day (QID) | RECTAL | Status: DC | PRN

## 2018-08-17 MED ORDER — ATORVASTATIN CALCIUM 20 MG PO TABS
20.0000 mg | ORAL_TABLET | Freq: Every evening | ORAL | Status: DC
  Administered 2018-08-17 – 2018-08-18 (×2): 20 mg via ORAL
  Filled 2018-08-17 (×2): qty 1

## 2018-08-17 MED ORDER — LOSARTAN POTASSIUM 50 MG PO TABS
100.0000 mg | ORAL_TABLET | Freq: Every day | ORAL | Status: DC
  Administered 2018-08-18 – 2018-08-19 (×2): 100 mg via ORAL
  Filled 2018-08-17 (×2): qty 2

## 2018-08-17 MED ORDER — PANTOPRAZOLE SODIUM 40 MG IV SOLR
40.0000 mg | INTRAVENOUS | Status: DC
  Administered 2018-08-18: 40 mg via INTRAVENOUS
  Filled 2018-08-17: qty 40

## 2018-08-17 MED ORDER — FERROUS SULFATE 325 (65 FE) MG PO TABS
325.0000 mg | ORAL_TABLET | Freq: Every day | ORAL | Status: DC
  Administered 2018-08-17 – 2018-08-19 (×3): 325 mg via ORAL
  Filled 2018-08-17 (×2): qty 1

## 2018-08-17 MED ORDER — FUROSEMIDE 10 MG/ML IJ SOLN
20.0000 mg | Freq: Once | INTRAMUSCULAR | Status: AC
  Administered 2018-08-17: 20 mg via INTRAVENOUS
  Filled 2018-08-17: qty 2

## 2018-08-17 MED ORDER — POLYETHYLENE GLYCOL 3350 17 G PO PACK: 17.0000 g | PACK | Freq: Every day | ORAL | Status: DC | PRN

## 2018-08-17 MED ORDER — POTASSIUM CHLORIDE CRYS ER 20 MEQ PO TBCR
40.0000 meq | EXTENDED_RELEASE_TABLET | Freq: Once | ORAL | Status: AC
  Administered 2018-08-17: 40 meq via ORAL
  Filled 2018-08-17: qty 2

## 2018-08-17 NOTE — ED Provider Notes (Signed)
Emergency Department Provider Note   I have reviewed the triage vital signs and the nursing notes.   HISTORY  Chief Complaint Abnormal Lab   HPI Scott Ritter is a 57 y.o. male with history of hypertension, stroke and symptomatic anemia secondary to GI bleed the presents to the emergency department today with fatigue, shortness of breath with exertion and weakness.  Patient states that he saw his doctor yesterday and was told he had a hemoglobin of that was low need to come to the emergency room.  Patient has no other complaints at this time.  No dark stools or bright red blood in his stools.  No other focality of blood loss. No other associated or modifying symptoms.    Past Medical History:  Diagnosis Date  . Hypertension   . Stroke [facility])     Patient Active Problem List   Diagnosis Date Noted  . Symptomatic anemia 02/18/2018  . Melena 02/18/2018  . History of Stroke 02/18/2018  . Leukocytosis 02/18/2018  . NSAID induced gastritis 02/18/2018  . Hyperlipidemia 02/18/2018  . Gastrointestinal hemorrhage with melena   . History of Mallory-Weiss tear 12/16/2016  . Hematemesis 12/16/2016  . HTN (hypertension) 12/16/2016  . Hypokalemia 12/16/2016  . Acute upper GI bleed   . Special screening for malignant neoplasms, colon     Past Surgical History:  Procedure Laterality Date  . BACK SURGERY    . COLONOSCOPY N/A 02/29/2016   Dr. Darrick Penna: redundant colon, non-bleeding internal hemorrhoids   . ESOPHAGOGASTRODUODENOSCOPY N/A 12/17/2016   Procedure: ESOPHAGOGASTRODUODENOSCOPY (EGD);  Surgeon: West Bali, MD;  Location: [facility] ENDO [facility]ITE;  Service: Endoscopy;  Laterality: N/A;  . ESOPHAGOGASTRODUODENOSCOPY N/A 02/19/2018   Procedure: ESOPHAGOGASTRODUODENOSCOPY (EGD);  Surgeon: Corbin Ade, MD;  Location: [facility] ENDO SUITE;  Service: Endoscopy;  Laterality: N/A;  . HERNIA REPAIR      Current Outpatient Rx  . Order #: [id number] Class: Historical Med  . Order #:  [id number] Class: Historical Med  . Order #: [id number] Class: Historical Med  . Order #: [id number] Class: Historical Med  . Order #: [id number] Class: Historical Med  . Order #: [id number] Class: Normal    Allergies Patient has no known allergies.  Family History  Problem Relation Age of Onset  . Hypertension Father   . Colon cancer Neg Hx     Social History Social History   Tobacco Use  . Smoking status: Never Smoker  . Smokeless tobacco: Never Used  Substance Use Topics  . Alcohol use: Yes    Comment: occasionally  . Drug use: No    Review of Systems  All other systems negative except as documented in the HPI. All pertinent positives and negatives as reviewed in the HPI. ____________________________________________   PHYSICAL EXAM:  VITAL SIGNS: ED Triage Vitals [08/17/18 1317]  Enc Vitals Group     BP (!) 179/85     Pulse Rate 99     Resp 16     Temp (!) 97.5 F (36.4 C)     Temp Source Tympanic     SpO2 100 %     Weight 211 lb (95.7 kg)     Height 5\' 7"  (1.702 m)    Constitutional: Alert and oriented. Well appearing and in no acute distress. Eyes: Conjunctivae are pale. PERRL. EOMI. Head: Atraumatic. Nose: No congestion/rhinnorhea. Mouth/Throat: Mucous membranes are moist.  Oropharynx non-erythematous. Neck: No stridor.  No meningeal signs.   Cardiovascular: Normal rate, regular rhythm. Good peripheral circulation. Grossly normal heart sounds.  Respiratory: Normal respiratory effort.  No retractions. Lungs CTAB. Gastrointestinal: Soft and nontender. No distention.  Musculoskeletal: No lower extremity tenderness nor edema. No gross deformities of extremities. Neurologic:  Normal speech and language. No gross focal neurologic deficits are appreciated.  Skin:  Skin is warm, pale, dry and intact. No rash noted.  ____________________________________________   LABS (all labs ordered are listed, but only abnormal results are displayed)  Labs Reviewed  CBC  WITH DIFFERENTIAL/PLATELET - Abnormal; Notable for the following components:      Result Value   WBC 12.2 (*)    RBC 3.33 (*)    Hemoglobin 7.2 (*)    HCT 24.6 (*)    MCV 73.9 (*)    MCH 21.6 (*)    MCHC 29.3 (*)    RDW 17.3 (*)    Neutro Abs 8.8 (*)    All other components within normal limits  COMPREHENSIVE METABOLIC PANEL - Abnormal; Notable for the following components:   Potassium 3.4 (*)    Creatinine, Ser 1.35 (*)    GFR calc non Af Amer 58 (*)    All other components within normal limits  IRON AND TIBC - Abnormal; Notable for the following components:   Iron 13 (*)    Saturation Ratios 3 (*)    All other components within normal limits  FERRITIN - Abnormal; Notable for the following components:   Ferritin 3 (*)    All other components within normal limits  RETICULOCYTES - Abnormal; Notable for the following components:   RBC. 3.33 (*)    Immature Retic Fract 38.3 (*)    All other components within normal limits  POC OCCULT BLOOD, ED - Abnormal; Notable for the following components:   Fecal Occult Bld POSITIVE (*)    All other components within normal limits  VITAMIN B12  FOLATE  TYPE AND SCREEN  PREPARE RBC (CROSSMATCH)   ____________________________________________  PROCEDURES  Procedure(s) performed:   Procedures  CRITICAL CARE Performed by: Marily Memos Total critical care time: 35 minutes Critical care time was exclusive of separately billable procedures and treating other patients. Critical care was necessary to treat or prevent imminent or life-threatening deterioration. Critical care was time spent personally by me on the following activities: development of treatment plan with patient and/or surrogate as well as nursing, discussions with consultants, evaluation of patient's response to treatment, examination of patient, obtaining history from patient or surrogate, ordering and performing treatments and interventions, ordering and review of laboratory  studies, ordering and review of radiographic studies, pulse oximetry and re-evaluation of patient's condition.  ____________________________________________   INITIAL IMPRESSION / ASSESSMENT AND PLAN / ED COURSE  Symptomatic anemia with hemoccult positive stool but not grossly positive. Will d/w GI and hospitalist for admission/management.   Pertinent labs & imaging results that were available during my care of the patient were reviewed by me and considered in my medical decision making (see chart for details).  ____________________________________________  FINAL CLINICAL IMPRESSION(S) / ED DIAGNOSES  Final diagnoses:  Symptomatic anemia     MEDICATIONS GIVEN DURING THIS VISIT:  Medications  0.9 %  sodium chloride infusion (has no administration in time range)     NEW OUTPATIENT MEDICATIONS STARTED DURING THIS VISIT:  New Prescriptions   No medications on file    Note:  This note was prepared with assistance of Dragon voice recognition software. Occasional wrong-word or sound-a-like substitutions may have occurred due to the inherent limitations of voice recognition software.   Jaidon Ellery, Barbara Cower, MD  08/17/18 1621  

## 2018-08-17 NOTE — H&P (Addendum)
History and Physical    SEVERO BEBER ZOX:[id number] DOB: Mar 24, 1961 DOA: 08/17/2018  PCP: Wilson Singer, MD   Patient coming from: Home  I have personally briefly reviewed patient's old medical records in [facility]  Chief Complaint: Shortness of breath with exertion, low blood count.  HPI: Scott Ritter is a 57 y.o. male with medical history significant Mallory-weiss tear, HTN, upper Gi bleed, anemia, with complaints of shortness of breath with exertion over the past week.  He saw his primary care provider for same yesterday, blood work was drawn and he was called today to come to the ED because low blood counts. He denies chest pain, no leg swelling.  He denies vomiting, abdominal pain or black stools.  He tells me he takes took aspirin very occasionally- last time 2 to 3 weeks ago he is unsure of the dose.  He otherwise denies NSAID use.  Denies Goody powder use.  He occasionally drinks alcohol ~1-2 bottles a week.  He is not on a PPI or H2 blocker  Similar presentation 02/2018-1 requiring 2 units PRBC.  Endoscopy by Dr. Naoma Diener gastropathy duodenal erosions thought to be NSAID induced.  ED Course: Stable vitals.  Hemoglobin low at 7.2-baseline prior to recent admission 11-12.  Active stool FOBT. Ferritin low 3, low serum iron 13, low iron saturation 3.  Reticulocyte- WNL.  2 units PRBC ordered in ED.   Review of Systems: As per HPI all other systems reviewed and negative.  Past Medical History:  Diagnosis Date  . Hypertension   . Stroke [facility])     Past Surgical History:  Procedure Laterality Date  . BACK SURGERY    . COLONOSCOPY N/A 02/29/2016   Dr. Darrick Penna: redundant colon, non-bleeding internal hemorrhoids   . ESOPHAGOGASTRODUODENOSCOPY N/A 12/17/2016   Procedure: ESOPHAGOGASTRODUODENOSCOPY (EGD);  Surgeon: West Bali, MD;  Location: [facility] ENDO SUITE;  Service: Endoscopy;  Laterality: N/A;  . ESOPHAGOGASTRODUODENOSCOPY N/A 02/19/2018   Procedure:  ESOPHAGOGASTRODUODENOSCOPY (EGD);  Surgeon: Corbin Ade, MD;  Location: [facility] ENDO SUITE;  Service: Endoscopy;  Laterality: N/A;  . HERNIA REPAIR       reports that he has never smoked. He has never used smokeless tobacco. He reports that he drinks alcohol. He reports that he does not use drugs.  No Known Allergies  Family History  Problem Relation Age of Onset  . Hypertension Father   . Colon cancer Neg Hx     Prior to Admission medications   Medication Sig Start Date End Date Taking? Authorizing Provider  amLODipine (NORVASC) 10 MG tablet Take 10 mg by mouth daily.   Yes [provider]  atorvastatin (LIPITOR) 20 MG tablet Take 20 mg by mouth every evening.  05/04/15  Yes [provider]  Cholecalciferol (VITAMIN D) 2000 units CAPS Take 2,000 Units by mouth daily.   Yes [provider]  hydrALAZINE (APRESOLINE) 50 MG tablet Take 50 mg by mouth 3 (three) times daily. 06/08/18  Yes [provider]  losartan (COZAAR) 100 MG tablet Take 100 mg by mouth daily. 06/10/18  Yes [provider]  potassium chloride SA (K-DUR,KLOR-CON) 20 MEQ tablet Take 1 tablet (20 mEq total) by mouth daily. 12/17/16  Halford Decamp, MD    Physical Exam: Vitals:   08/17/18 1630 08/17/18 1647 08/17/18 1700 08/17/18 1730  BP: (!) 156/115 (!) 188/94 (!) 170/82 (!) 181/89  Pulse: 73 78 77 69  Resp:  18    Temp:  98.7 F (37.1  C)    TempSrc:  Oral    SpO2: 100% 98% 100% 98%  Weight:      Height:        Constitutional: NAD, calm, comfortable Vitals:   08/17/18 1630 08/17/18 1647 08/17/18 1700 08/17/18 1730  BP: (!) 156/115 (!) 188/94 (!) 170/82 (!) 181/89  Pulse: 73 78 77 69  Resp:  18    Temp:  98.7 F (37.1 C)    TempSrc:  Oral    SpO2: 100% 98% 100% 98%  Weight:      Height:       Eyes: PERRL, lids and conjunctivae normal ENMT: Mucous membranes are moist. Posterior pharynx clear of any exudate or lesions.Normal dentition.  Neck: normal, supple,  no masses, no thyromegaly Respiratory: clear to auscultation bilaterally, no wheezing, no crackles. Normal respiratory effort. No accessory muscle use.  Cardiovascular: Regular rate and rhythm, no murmurs / rubs / gallops. No extremity edema. 2+ pedal pulses. No carotid bruits.  Abdomen: no tenderness, no masses palpated. No hepatosplenomegaly. Bowel sounds positive.  Musculoskeletal: no clubbing / cyanosis. No joint deformity upper and lower extremities. Good ROM, no contractures. Normal muscle tone.  Skin: no rashes, lesions, ulcers. No induration Neurologic: CN 2-12 grossly intact, moving all extremities spontaneously Psychiatric: Normal judgment and insight. Alert and oriented x 3. Normal mood.   Labs on Admission: I have personally reviewed following labs and imaging studies  CBC: Recent Labs  Lab 08/17/18 1428  WBC 12.2*  NEUTROABS 8.8*  HGB 7.2*  HCT 24.6*  MCV 73.9*  PLT 387   Basic Metabolic Panel: Recent Labs  Lab 08/17/18 1428  NA 137  K 3.4*  CL 104  CO2 23  GLUCOSE 91  BUN 16  CREATININE 1.35*  CALCIUM 9.0  MG 2.0   GFR: Estimated Creatinine Clearance: 68.1 mL/min (A) (by C-G formula based on SCr of 1.35 mg/dL (H)). Liver Function Tests: Recent Labs  Lab 08/17/18 1428  AST 18  ALT 12  ALKPHOS 80  BILITOT 0.6  PROT 7.7  ALBUMIN 4.0   Anemia Panel: Recent Labs    08/17/18 1428  VITAMINB12 256  FOLATE 9.7  FERRITIN 3*  TIBC 441  IRON 13*  RETICCTPCT 2.2    Radiological Exams on Admission: No results found.  EKG: None  Assessment/Plan Principal Problem:   Symptomatic anemia Active Problems:   History of Mallory-Weiss tear   HTN (hypertension)   Acute upper GI bleed   Acute symptomatic anemia- hgb 7.2.  Denies melena hematemesis or hematochezia. Similar admission 02/2018-EGD by Dr. Benard Rink, erosive gastropathy and duodenal erosions, thought NSAID induced.  Last colonoscopy done 2017 for screening purposes-nonbleeding internal hemorrhoids.   Baseline Hgb prior to GI bleed- 11- 12.  Blood work notable for severe iron deficiency.  Positive stool FOBT.  Reports mild occasional aspirin use. -GI consult a.m.-order placed -Transfuse 2 units ordered in ED - IV lasix 20mg  x 1 between transfusions -IV Protonix 40 daily for now, doubt acute bleed. -Start iron 325 daily -Consider IV iron prior to discharge - CBC a.m -Clear liquid diet for now, n.p.o. midnight  HTN-blood pressure systolic 150s to 161W. -Continue home losartan Norvasc and hydralazine  Mild hypokalemia- 3.4. -Check magnesium- 2 -Replete k  DVT prophylaxis: Scds Code Status: Full Family Communication: None at bedside Disposition Plan: 1-2 days Consults called: GI Admission status: Obs, tele   Onnie Boer MD [facility] Pager [phone] From 3PM-11PM.  Otherwise please contact night-coverage [email or web address] Password [email or web address]  08/17/2018, 6:55 PM

## 2018-08-17 NOTE — ED Triage Notes (Signed)
Pt was sent by doctor's office for "low blood count". States this happened several months ago and he had a blood transfusion. Denies bloody or tarry stools. Does report increase in fatigue and feeling "winded" with ambulation.

## 2018-08-18 ENCOUNTER — Encounter (HOSPITAL_COMMUNITY): Payer: Self-pay | Admitting: *Deleted

## 2018-08-18 ENCOUNTER — Encounter (HOSPITAL_COMMUNITY)
Admission: EM | Disposition: A | Discharge status: Home / Self Care | Payer: Self-pay | Source: Home / Self Care | Attending: Emergency Medicine

## 2018-08-18 DIAGNOSIS — K449 Diaphragmatic hernia without obstruction or gangrene: Secondary | ICD-10-CM | POA: Diagnosis not present

## 2018-08-18 DIAGNOSIS — K2270 Barrett's esophagus without dysplasia: Secondary | ICD-10-CM | POA: Diagnosis not present

## 2018-08-18 DIAGNOSIS — D649 Anemia, unspecified: Secondary | ICD-10-CM | POA: Diagnosis not present

## 2018-08-18 DIAGNOSIS — K922 Gastrointestinal hemorrhage, unspecified: Secondary | ICD-10-CM | POA: Diagnosis not present

## 2018-08-18 HISTORY — PX: ESOPHAGOGASTRODUODENOSCOPY: SHX1529

## 2018-08-18 HISTORY — PX: ESOPHAGOGASTRODUODENOSCOPY: SHX5428

## 2018-08-18 HISTORY — PX: GIVENS CAPSULE STUDY: SHX5432

## 2018-08-18 LAB — TYPE AND SCREEN
ABO/RH(D): A POS
ANTIBODY SCREEN: NEGATIVE
Unit division: 0
Unit division: 0

## 2018-08-18 LAB — CBC
HCT: 31.2 % — ABNORMAL LOW (ref 39.0–52.0)
Hemoglobin: 9.4 g/dL — ABNORMAL LOW (ref 13.0–17.0)
MCH: 23 pg — ABNORMAL LOW (ref 26.0–34.0)
MCHC: 30.1 g/dL (ref 30.0–36.0)
MCV: 76.3 fL — AB (ref 80.0–100.0)
NRBC: 0 % (ref 0.0–0.2)
Platelets: 371 10*3/uL (ref 150–400)
RBC: 4.09 MIL/uL — ABNORMAL LOW (ref 4.22–5.81)
RDW: 18.8 % — ABNORMAL HIGH (ref 11.5–15.5)
WBC: 14.4 10*3/uL — ABNORMAL HIGH (ref 4.0–10.5)

## 2018-08-18 LAB — BPAM RBC
BLOOD PRODUCT EXPIRATION DATE: 201912022359
Blood Product Expiration Date: 201912022359
ISSUE DATE / TIME: 201911051628
ISSUE DATE / TIME: 201911052030
UNIT TYPE AND RH: 6200
UNIT TYPE AND RH: 6200

## 2018-08-18 SURGERY — EGD (ESOPHAGOGASTRODUODENOSCOPY)
Anesthesia: Moderate Sedation

## 2018-08-18 MED ORDER — LIDOCAINE VISCOUS HCL 2 % MT SOLN
OROMUCOSAL | Status: AC
  Filled 2018-08-18: qty 15

## 2018-08-18 MED ORDER — LIDOCAINE VISCOUS HCL 2 % MT SOLN
OROMUCOSAL | Status: DC | PRN
  Administered 2018-08-18: 1 via OROMUCOSAL

## 2018-08-18 MED ORDER — MEPERIDINE HCL 50 MG/ML IJ SOLN
INTRAMUSCULAR | Status: DC | PRN
  Administered 2018-08-18 (×2): 25 mg via INTRAVENOUS

## 2018-08-18 MED ORDER — SODIUM CHLORIDE 0.9 % IV SOLN
510.0000 mg | Freq: Once | INTRAVENOUS | Status: AC
  Administered 2018-08-18: 510 mg via INTRAVENOUS
  Filled 2018-08-18: qty 17

## 2018-08-18 MED ORDER — SODIUM CHLORIDE 0.9 % IV SOLN
INTRAVENOUS | Status: DC
  Administered 2018-08-18: 1000 mL via INTRAVENOUS

## 2018-08-18 MED ORDER — MIDAZOLAM HCL 5 MG/5ML IJ SOLN
INTRAMUSCULAR | Status: AC
  Filled 2018-08-18: qty 10

## 2018-08-18 MED ORDER — STERILE WATER FOR IRRIGATION IR SOLN
Status: DC | PRN
  Administered 2018-08-18: 1.5 mL

## 2018-08-18 MED ORDER — MEPERIDINE HCL 50 MG/ML IJ SOLN
INTRAMUSCULAR | Status: AC
  Filled 2018-08-18: qty 1

## 2018-08-18 MED ORDER — MIDAZOLAM HCL 5 MG/5ML IJ SOLN
INTRAMUSCULAR | Status: DC | PRN
  Administered 2018-08-18: 2 mg via INTRAVENOUS
  Administered 2018-08-18: 1 mg via INTRAVENOUS
  Administered 2018-08-18: 2 mg via INTRAVENOUS
  Administered 2018-08-18 (×2): 1 mg via INTRAVENOUS
  Administered 2018-08-18: 2 mg via INTRAVENOUS

## 2018-08-18 NOTE — Progress Notes (Signed)
Brief EGD note:  Salmon-colored mucosa involving the distal 15 cm esophagus with squamous island.  Endoscopic appearance consistent with short segment Barrett's.  Biopsy not taken. 2 cm sized sliding hiatal hernia. Examination of stomach first second third part of duodenum. Given capsule placed endoscopically into second part of duodenum. Somewhat difficult to advance given capsule via oropharynx.

## 2018-08-18 NOTE — Progress Notes (Signed)
Patient received 2 units of blood and tolerated well.

## 2018-08-18 NOTE — Progress Notes (Signed)
Pt being transported down to the endo suite via transport staff.

## 2018-08-18 NOTE — Op Note (Signed)
[facility] Patient Name: Scott Ritter Procedure Date: 08/18/2018 3:01 PM MRN: [id number] Date of Birth: 1961/01/17 Attending MD: Lionel December , MD CSN: [id number] Age: 57 Admit Type: Inpatient Procedure:                Upper GI endoscopy with placement of Given capsule                            for small bowel study. Indications:              Anemia secondary to chronic blood loss Providers:                Lionel December, MD, Nena Polio, RN, Burke Keels,                            Technician Referring MD:              Medicines:                Lidocaine spray, Meperidine 50 mg IV, Midazolam 9                            mg IV Complications:            No immediate complications. Estimated Blood Loss:     Estimated blood loss: none. Procedure:                Pre-Anesthesia Assessment:                           - Prior to the procedure, a History and Physical                            was performed, and patient medications and                            allergies were reviewed. The patient's tolerance of                            previous anesthesia was also reviewed. The risks                            and benefits of the procedure and the sedation                            options and risks were discussed with the patient.                            All questions were answered, and informed consent                            was obtained. Prior Anticoagulants: The patient has                            taken no previous anticoagulant or antiplatelet  agents. ASA Grade Assessment: III - A patient with                            severe systemic disease. After reviewing the risks                            and benefits, the patient was deemed in                            satisfactory condition to undergo the procedure.                           After obtaining informed consent, the endoscope was                            passed under direct  vision. Throughout the                            procedure, the patient's blood pressure, pulse, and                            oxygen saturations were monitored continuously. The                            [id number]-H190 ([id number]) scope was introduced through the                            mouth, and advanced to the third part of duodenum.                            The upper GI endoscopy was technically difficult                            and complex. Successful completion of the procedure                            was aided by increasing the dose of sedation                            medication. The patient tolerated the procedure                            well. Scope In: 3:34:08 PM Scope Out: 3:55:51 PM Total Procedure Duration: 0 hours 21 minutes 43 seconds  Findings:      The proximal esophagus, mid esophagus and distal esophagus were normal.      There were esophageal mucosal changes consistent with short-segment       Barrett's esophagus present in the distal esophagus. The maximum       longitudinal extent of these mucosal changes was 1 cm in length.      A 2 cm hiatal hernia was present.      The entire examined stomach was normal.      The duodenal bulb, second portion of the duodenum, major papilla and  third portion of the duodenum were normal.      Using the endoscope, the video capsule enteroscope was advanced into the       second portion of the duodenum. Impression:               - Normal proximal esophagus, mid esophagus and                            distal esophagus.                           - Esophageal mucosal changes consistent with                            short-segment Barrett's esophagus. Length 15 mm.                            Squamous island noted in the middle of abnormal                            mucosa.                           - 2 cm hiatal hernia.                           - Normal stomach.                           - Normal duodenal bulb,  second portion of the                            duodenum, major papilla and third portion of the                            duodenum.                           - Successful completion of the Video Capsule                            Enteroscope placement.                           - No specimens collected. Moderate Sedation:      Moderate (conscious) sedation was administered by the endoscopy nurse       and supervised by the endoscopist. The following parameters were       monitored: oxygen saturation, heart rate, blood pressure, CO2       capnography and response to care. Total physician intraservice time was       33 minutes. Recommendation:           - Return patient to hospital ward for ongoing care.                           - NPO for two hours; then clear liquids.                           -  Continue present medications.                           - Small bowel study to be read tomorrow morning.                           - H/H in am. Procedure Code(s):        --- Professional ---                           [phone], Esophagogastroduodenoscopy, flexible,                            transoral; diagnostic, including collection of                            specimen(s) by brushing or washing, when performed                            (separate procedure)                           99153, Moderate sedation; each additional 15                            minutes intraservice time                           G0500, Moderate sedation services provided by the                            same physician or other qualified health care                            professional performing a gastrointestinal                            endoscopic service that sedation supports,                            requiring the presence of an independent trained                            observer to assist in the monitoring of the                            patient's level of consciousness and physiological                             status; initial 15 minutes of intra-service time;                            patient age 27 years or older (additional time may                            be reported with 60454,  as appropriate) Diagnosis Code(s):        --- Professional ---                           K44.9, Diaphragmatic hernia without obstruction or                            gangrene                           K22.8, Other specified diseases of esophagus                           D50.0, Iron deficiency anemia secondary to blood                            loss (chronic) CPT copyright 2018 [employer]. All rights reserved. The codes documented in this report are preliminary and upon coder review may  be revised to meet current compliance requirements. Lionel December, MD Lionel December, MD 08/18/2018 4:15:40 PM This report has been signed electronically. Number of Addenda: 0

## 2018-08-18 NOTE — Progress Notes (Signed)
PROGRESS NOTE    Scott Ritter  WUJ:[id number] DOB: 24-Aug-1961 DOA: 08/17/2018 PCP: Wilson Singer, MD   Brief Narrative:  Per HPI from Dr. Mariea Clonts on 11/5: Scott Ritter is a 57 y.o. male with medical history significant Mallory-weiss tear, HTN, upper Gi bleed, anemia, with complaints of shortness of breath with exertion over the past week.  He saw his primary care provider for same yesterday, blood work was drawn and he was called today to come to the ED because low blood counts. He denies chest pain, no leg swelling.  He denies vomiting, abdominal pain or black stools.  He tells me he takes took aspirin very occasionally- last time 2 to 3 weeks ago he is unsure of the dose.  He otherwise denies NSAID use.  Denies Goody powder use.  He occasionally drinks alcohol ~1-2 bottles a week.  He is not on a PPI or H2 blocker  Similar presentation 02/2018-1 requiring 2 units PRBC.  Endoscopy by Dr. Naoma Diener gastropathy duodenal erosions thought to be NSAID induced.  Patient was admitted with acute symptomatic anemia with improvement in hemoglobin level after 2 unit PRBC transfusion.  IV iron to be given today with GI evaluation with EGD and small capsule study pending.  Assessment & Plan:   Principal Problem:   Symptomatic anemia Active Problems:   History of Mallory-Weiss tear   HTN (hypertension)   Acute upper GI bleed   1. Acute, symptomatic blood loss anemia.  This is improved status post 2 unit PRBC transfusion, but patient was noted to have a positive FOBT and GI plans to do EGD with capsule endoscopy with results that should be available by tomorrow.  IV Feraheme given today due to noted iron deficiency.  Maintain on Protonix and clear liquid diet per GI. 2. Hypertension.  Continue home losartan, Norvasc, and hydralazine with satisfactory control noted.  Will add IV hydralazine for breakthrough as needed. 3. Mild hypokalemia.  Recheck labs in a.m.  Magnesium noted to be  okay.   DVT prophylaxis: SCDs Code Status: Full Family Communication: None at bedside Disposition Plan: GI for endoscopy and small capsule study likely today.   Consultants:   GI  Procedures:   None  Antimicrobials:   None   Subjective: Patient seen and evaluated today with no new acute complaints or concerns. No acute concerns or events noted overnight.  Objective: Vitals:   08/17/18 2040 08/17/18 2054 08/17/18 2300 08/18/18 0528  BP: (!) 190/98 (!) 194/90 (!) 170/90 (!) 171/91  Pulse: 71 71 73 79  Resp: 18  16 19   Temp: 97.6 F (36.4 C) 99.1 F (37.3 C) 98.9 F (37.2 C) 98.6 F (37 C)  TempSrc: Oral Oral Oral Oral  SpO2: 96% 98% 98% 99%  Weight:      Height:        Intake/Output Summary (Last 24 hours) at 08/18/2018 1219 Last data filed at 08/18/2018 0500 Gross per 24 hour  Intake 1587.5 ml  Output 2100 ml  Net -512.5 ml   Filed Weights   08/17/18 1317 08/17/18 1837  Weight: 95.7 kg 94 kg    Examination:  General exam: Appears calm and comfortable  Respiratory system: Clear to auscultation. Respiratory effort normal. Cardiovascular system: S1 & S2 heard, RRR. No JVD, murmurs, rubs, gallops or clicks. No pedal edema. Gastrointestinal system: Abdomen is nondistended, soft and nontender. No organomegaly or masses felt. Normal bowel sounds heard. Central nervous system: Alert and oriented. No focal neurological deficits. Extremities: Symmetric  5 x 5 power. Skin: No rashes, lesions or ulcers Psychiatry: Judgement and insight appear normal. Mood & affect appropriate.     Data Reviewed: I have personally reviewed following labs and imaging studies  CBC: Recent Labs  Lab 08/17/18 1428 08/18/18 0414  WBC 12.2* 14.4*  NEUTROABS 8.8*  --   HGB 7.2* 9.4*  HCT 24.6* 31.2*  MCV 73.9* 76.3*  PLT 387 371   Basic Metabolic Panel: Recent Labs  Lab 08/17/18 1428  NA 137  K 3.4*  CL 104  CO2 23  GLUCOSE 91  BUN 16  CREATININE 1.35*  CALCIUM 9.0    MG 2.0   GFR: Estimated Creatinine Clearance: 67.6 mL/min (A) (by C-G formula based on SCr of 1.35 mg/dL (H)). Liver Function Tests: Recent Labs  Lab 08/17/18 1428  AST 18  ALT 12  ALKPHOS 80  BILITOT 0.6  PROT 7.7  ALBUMIN 4.0   No results for input(s): LIPASE, AMYLASE in the last 168 hours. No results for input(s): AMMONIA in the last 168 hours. Coagulation Profile: No results for input(s): INR, PROTIME in the last 168 hours. Cardiac Enzymes: No results for input(s): CKTOTAL, CKMB, CKMBINDEX, TROPONINI in the last 168 hours. BNP (last 3 results) No results for input(s): PROBNP in the last 8760 hours. HbA1C: No results for input(s): HGBA1C in the last 72 hours. CBG: No results for input(s): GLUCAP in the last 168 hours. Lipid Profile: No results for input(s): CHOL, HDL, LDLCALC, TRIG, CHOLHDL, LDLDIRECT in the last 72 hours. Thyroid Function Tests: No results for input(s): TSH, T4TOTAL, FREET4, T3FREE, THYROIDAB in the last 72 hours. Anemia Panel: Recent Labs    08/17/18 1428  VITAMINB12 256  FOLATE 9.7  FERRITIN 3*  TIBC 441  IRON 13*  RETICCTPCT 2.2   Sepsis Labs: No results for input(s): PROCALCITON, LATICACIDVEN in the last 168 hours.  No results found for this or any previous visit (from the past 240 hour(s)).       Radiology Studies: No results found.      Scheduled Meds: . amLODipine  10 mg Oral Daily  . atorvastatin  20 mg Oral QPM  . ferrous sulfate  325 mg Oral Q breakfast  . hydrALAZINE  50 mg Oral Q8H  . losartan  100 mg Oral Daily  . pantoprazole (PROTONIX) IV  40 mg Intravenous Q24H  . potassium chloride SA  20 mEq Oral Daily   Continuous Infusions: . sodium chloride       LOS: 0 days    Time spent: 30 minutes    Scott Scogin Hoover Brunette, DO [facility] Pager [phone]  If 7PM-7AM, please contact night-coverage [email or web address] Password [email or web address] 08/18/2018, 12:19 PM

## 2018-08-18 NOTE — Care Management Obs Status (Signed)
MEDICARE OBSERVATION STATUS NOTIFICATION   Patient Details  Name: Scott Ritter MRN: [id number] Date of Birth: [birth year]   Medicare Observation Status Notification Given:  Yes    Renie Ora 08/18/2018, 11:59 AM

## 2018-08-18 NOTE — Consult Note (Signed)
Reason for Consult: anemia Referring Physician:  Hospitalist  Scott Ritter is an 57 y.o. male.  HPI: Patient saw Dr. Anastasio Champion for routine appt Monday. Was feeling SOB an weak. Symptoms for about a week. Lab work drawn and noted to have low hemoglobin. He was referred to the ED for further evaluation. Admission hemoglobin in ED was 7.2. Has received 2 units of PRBCs. Hemoglobin this am 9.4 post transfusion. Denies any NSAIDs. No vomiting. Stools have been brown. He has not seen any blood in his stools. Admitted to [facility] back in May with melena and underwent an EGD by Dr. Gala Romney which revealed mucosal changes in the esophagus- query short segment Barrett's. Duodenal erosions.     12/17/2016 EGD: Dr. Oneida Alar: hematemesis: 3 mm non-bleeding Mallory Weiss tear with no stigmata of recent bleeding was found.       His last colonoscopy was in 2017 by Dr. Oneida Alar (screening): Non bleeding internal hemorrhoids. Redundant colon.  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 He however can walk.   Past Medical History:  Diagnosis Date  . Hypertension   . Stroke [facility])     Past Surgical History:  Procedure Laterality Date  . BACK SURGERY    . COLONOSCOPY N/A 02/29/2016   Dr. Oneida Alar: redundant colon, non-bleeding internal hemorrhoids   . ESOPHAGOGASTRODUODENOSCOPY N/A 12/17/2016   Procedure: ESOPHAGOGASTRODUODENOSCOPY (EGD);  Surgeon: Danie Binder, MD;  Location: [facility] ENDO [facility]ITE;  Service: Endoscopy;  Laterality: N/A;  . ESOPHAGOGASTRODUODENOSCOPY N/A 02/19/2018   Procedure: ESOPHAGOGASTRODUODENOSCOPY (EGD);  Surgeon: Daneil Dolin, MD;  Location: [facility] END[facility];  Service: Endoscopy;  Laterality: N/A;  . HERNIA REPAIR      Family History  Problem Relation Age of Onset  . Hypertension Father   . Colon cancer Neg Hx     Social History:  reports that he has never smoked. He has never used smokeless tobacco. He reports that he drinks alcohol. He reports that he does not use  drugs.  Allergies: No Known Allergies  Medications: I have reviewed the patient's current medications.  Results for orders placed or performed during the hospital encounter of 08/17/18 (from the past 48 hour(s))  POC occult blood, ED     Status: Abnormal   Collection Time: 08/17/18  2:00 PM  Result Value Ref Range   Fecal Occult Bld POSITIVE (A) NEGATIVE  CBC with Differential     Status: Abnormal   Collection Time: 08/17/18  2:28 PM  Result Value Ref Range   WBC 12.2 (H) 4.0 - 10.5 K/uL   RBC 3.33 (L) 4.22 - 5.81 MIL/uL   Hemoglobin 7.2 (L) 13.0 - 17.0 g/dL    Comment: Reticulocyte Hemoglobin testing may be clinically indicated, consider ordering this additional test [id number]    HCT 24.6 (L) 39.0 - 52.0 %   MCV 73.9 (L) 80.0 - 100.0 fL   MCH 21.6 (L) 26.0 - 34.0 pg   MCHC 29.3 (L) 30.0 - 36.0 g/dL   RDW 17.3 (H) 11.5 - 15.5 %   Platelets 387 150 - 400 K/uL    Comment: SPECIMEN CHECKED FOR CLOTS PLATELET COUNT CONFIRMED BY SMEAR    nRBC 0.0 0.0 - 0.2 %   Neutrophils Relative % 72 %   Neutro Abs 8.8 (H) 1.7 - 7.7 K/uL   Lymphocytes Relative 15 %   Lymphs Abs 1.9 0.7 - 4.0 K/uL   Monocytes Relative 9 %   Monocytes Absolute 1.0 0.1 - 1.0 K/uL  Eosinophils Relative 3 %   Eosinophils Absolute 0.3 0.0 - 0.5 K/uL   Basophils Relative 1 %   Basophils Absolute 0.1 0.0 - 0.1 K/uL   Immature Granulocytes 0 %   Abs Immature Granulocytes 0.04 0.00 - 0.07 K/uL    Comment: Performed at [facility], [address], [place], [place] [address]  Comprehensive metabolic panel     Status: Abnormal   Collection Time: 08/17/18  2:28 PM  Result Value Ref Range   Sodium 137 135 - 145 mmol/L   Potassium 3.4 (L) 3.5 - 5.1 mmol/L   Chloride 104 98 - 111 mmol/L   CO2 23 22 - 32 mmol/L   Glucose, Bld 91 70 - 99 mg/dL   BUN 16 6 - 20 mg/dL   Creatinine, Ser 1.35 (H) 0.61 - 1.24 mg/dL   Calcium 9.0 8.9 - 10.3 mg/dL   Total Protein 7.7 6.5 - 8.1 g/dL   Albumin 4.0 3.5 - 5.0 g/dL   AST 18 15  - 41 U/L   ALT 12 0 - 44 U/L   Alkaline Phosphatase 80 38 - 126 U/L   Total Bilirubin 0.6 0.3 - 1.2 mg/dL   GFR calc non Af Amer 58 (L) >60 mL/min   GFR calc Af Amer >60 >60 mL/min    Comment: (NOTE) The eGFR has been calculated using the CKD EPI equation. This calculation has not been validated in all clinical situations. eGFR's persistently <60 mL/min signify possible Chronic Kidney Disease.    Anion gap 10 5 - 15    Comment: Performed at [facility], [address] [place], [place] [address]  Type and screen     Status: None   Collection Time: 08/17/18  2:28 PM  Result Value Ref Range   ABO/RH(D) A POS    Antibody Screen NEG    Sample Expiration 08/20/2018    Unit Number [id number]    Blood Component Type RED CELLS,LR    Unit division 00    Status of Unit ISSUED,FINAL    Transfusion Status OK TO TRANSFUSE    Crossmatch Result Compatible    Unit Number [id number]    Blood Component Type RED CELLS,LR    Unit division 00    Status of Unit ISSUED,FINAL    Transfusion Status OK TO TRANSFUSE    Crossmatch Result      Compatible Performed at [facility], [address], [place], [place] [address]   Vitamin B12     Status: None   Collection Time: 08/17/18  2:28 PM  Result Value Ref Range   Vitamin B-12 256 180 - 914 pg/mL    Comment: (NOTE) This assay is not validated for testing neonatal or myeloproliferative syndrome specimens for Vitamin B12 levels. Performed at [facility], [address] [place], [place] [address]   Folate     Status: None   Collection Time: 08/17/18  2:28 PM  Result Value Ref Range   Folate 9.7 >5.9 ng/mL    Comment: Performed at [facility], [address] [place], [place] [address]  Iron and TIBC     Status: Abnormal   Collection Time: 08/17/18  2:28 PM  Result Value Ref Range   Iron 13 (L) 45 - 182 ug/dL   TIBC 441 250 - 450 ug/dL   Saturation Ratios 3 (L) 17.9 - 39.5 %   UIBC 428 ug/dL    Comment: Performed at [facility],  [address], [place], [place] [address]  Ferritin  Status: Abnormal   Collection Time: 08/17/18  2:28 PM  Result Value Ref Range   Ferritin 3 (L) 24 - 336 ng/mL    Comment: Performed at [facility], [address], [place], [place] [address]  Reticulocytes     Status: Abnormal   Collection Time: 08/17/18  2:28 PM  Result Value Ref Range   Retic Ct Pct 2.2 0.4 - 3.1 %   RBC. 3.33 (L) 4.22 - 5.81 MIL/uL   Retic Count, Absolute 73.9 19.0 - 186.0 K/uL   Immature Retic Fract 38.3 (H) 2.3 - 15.9 %    Comment: Performed at [facility], [address] [place], [place] [address]  Prepare RBC     Status: None   Collection Time: 08/17/18  2:28 PM  Result Value Ref Range   Order Confirmation      ORDER PROCESSED BY BLOOD BANK Performed at [facility], [address] [place], [place] [address]   Magnesium     Status: None   Collection Time: 08/17/18  2:28 PM  Result Value Ref Range   Magnesium 2.0 1.7 - 2.4 mg/dL    Comment: Performed at [facility], [address] [place], [place] [address]  CBC     Status: Abnormal   Collection Time: 08/18/18  4:14 AM  Result Value Ref Range   WBC 14.4 (H) 4.0 - 10.5 K/uL   RBC 4.09 (L) 4.22 - 5.81 MIL/uL   Hemoglobin 9.4 (L) 13.0 - 17.0 g/dL   HCT 31.2 (L) 39.0 - 52.0 %   MCV 76.3 (L) 80.0 - 100.0 fL   MCH 23.0 (L) 26.0 - 34.0 pg   MCHC 30.1 30.0 - 36.0 g/dL   RDW 18.8 (H) 11.5 - 15.5 %   Platelets 371 150 - 400 K/uL   nRBC 0.0 0.0 - 0.2 %    Comment: Performed at [facility], [address], [place], [place] [address]    No results found.  ROS Blood pressure (!) 171/91, pulse 79, temperature 98.6 F (37 C), temperature source Oral, resp. rate 19, height 5' 7" (1.702 m), weight 94 kg, SpO2 99 %. Physical Exam Alert and oriented. Skin warm and dry. Oral mucosa is moist.   . Sclera anicteric, conjunctivae is pink. Thyroid not enlarged. No cervical lymphadenopathy. Lungs clear. Heart regular rate and rhythm.  Abdomen is soft. Bowel sounds are  positive. No hepatomegaly. No abdominal masses felt. No tenderness.  No edema to lower extremities.  Rectal exam: no masses. Guaiac positive. Stool light brown in color.     Assessment/Plan: Anemia. Guaiac positive stool.  PUD needs to be ruled out.  EGD. I discussed this case with Dr. Laural Golden.  Terri L Setzer 08/18/2018, 8:15 AM

## 2018-08-19 ENCOUNTER — Encounter (HOSPITAL_COMMUNITY): Payer: Self-pay | Admitting: Gastroenterology

## 2018-08-19 DIAGNOSIS — D649 Anemia, unspecified: Secondary | ICD-10-CM

## 2018-08-19 DIAGNOSIS — D500 Iron deficiency anemia secondary to blood loss (chronic): Secondary | ICD-10-CM

## 2018-08-19 DIAGNOSIS — D509 Iron deficiency anemia, unspecified: Secondary | ICD-10-CM

## 2018-08-19 LAB — BASIC METABOLIC PANEL
ANION GAP: 9 (ref 5–15)
BUN: 15 mg/dL (ref 6–20)
CHLORIDE: 106 mmol/L (ref 98–111)
CO2: 23 mmol/L (ref 22–32)
Calcium: 9.2 mg/dL (ref 8.9–10.3)
Creatinine, Ser: 1.22 mg/dL (ref 0.61–1.24)
GFR calc Af Amer: >60 mL/min (ref >60)
Glucose, Bld: 91 mg/dL (ref 70–99)
POTASSIUM: 3.2 mmol/L — AB (ref 3.5–5.1)
SODIUM: 138 mmol/L (ref 135–145)

## 2018-08-19 LAB — CBC
HCT: 33.2 % — ABNORMAL LOW (ref 39.0–52.0)
Hemoglobin: 9.9 g/dL — ABNORMAL LOW (ref 13.0–17.0)
MCH: 22.9 pg — AB (ref 26.0–34.0)
MCHC: 29.8 g/dL — ABNORMAL LOW (ref 30.0–36.0)
MCV: 76.9 fL — AB (ref 80.0–100.0)
NRBC: 0 % (ref 0.0–0.2)
PLATELETS: 356 10*3/uL (ref 150–400)
RBC: 4.32 MIL/uL (ref 4.22–5.81)
RDW: 19.4 % — ABNORMAL HIGH (ref 11.5–15.5)
WBC: 9.5 10*3/uL (ref 4.0–10.5)

## 2018-08-19 MED ORDER — FERROUS SULFATE 325 (65 FE) MG PO TABS: 325.0000 mg | ORAL_TABLET | Freq: Every day | ORAL | 3 refills | Status: DC

## 2018-08-19 MED ORDER — POTASSIUM CHLORIDE CRYS ER 20 MEQ PO TBCR
40.0000 meq | EXTENDED_RELEASE_TABLET | Freq: Once | ORAL | Status: AC
  Administered 2018-08-19: 40 meq via ORAL
  Filled 2018-08-19: qty 2

## 2018-08-19 NOTE — Discharge Summary (Signed)
Physician Discharge Summary  Scott Ritter OZH:[id number] DOB: May 04, 1961 DOA: 08/17/2018  PCP: Wilson Singer, MD  Admit date: 08/17/2018 Discharge date: 08/19/2018  Admitted From: Home Disposition:  Home  Recommendations for Outpatient Follow-up:  1. Follow up with PCP in 1-2 weeks 2. Please obtain BMP/CBC in one week 3. Please follow up on the results of your capsule endoscopy 4. 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  He received 2 units packed red blood cells.  He had no further or additional bleeding.  He did not present with any hematemesis, melena, hematochezia.  He did have an endoscopy on 08/18/2018 that showed no clear source for the bleeding.  He did have capsule endoscopy placed which is pending read.  His hemoglobin was stable on discharge.  He was started on iron supplementation as he was noted to be iron deficient.  #) Hypertension/hyperlipidemia: Patient was continued on amlodipine, atorvastatin, hydralazine, losartan.  Discharge Diagnoses:  Principal Problem:   Symptomatic anemia Active Problems:   History of Mallory-Weiss tear   HTN (hypertension)   Acute upper GI bleed    Discharge Instructions  Discharge Instructions    Call MD for:  difficulty breathing, headache or visual disturbances   Complete by:  As directed    Call MD for:  hives   Complete by:  As directed    Call MD for:  persistant dizziness or light-headedness   Complete by:  As directed    Call MD for:  persistant nausea and vomiting   Complete by:  As directed    Call MD for:  redness, tenderness, or signs of infection (pain, swelling, redness, odor  or green/yellow discharge around incision site)   Complete by:  As directed    Call MD for:  severe uncontrolled pain   Complete by:  As directed    Call MD for:  temperature >100.4   Complete by:  As directed    Diet - low sodium heart healthy   Complete by:  As directed    Discharge instructions   Complete by:  As directed    Please follow-up with your primary care doctor and please have your blood work checked.  Please follow-up with your GI doctor as an outpatient.   Increase activity slowly   Complete by:  As directed      Allergies as of 08/19/2018   No Known Allergies     Medication List    TAKE these medications   amLODipine 10 MG tablet Commonly known as:  NORVASC Take 10 mg by mouth daily.   atorvastatin 20 MG tablet Commonly known as:  LIPITOR Take 20 mg by mouth every evening.   ferrous sulfate 325 (65 FE) MG tablet Take 1 tablet (325 mg total) by mouth daily with breakfast. Start taking on:  08/20/2018   hydrALAZINE 50 MG tablet Commonly known as:  APRESOLINE Take 50 mg by mouth 3 (three) times daily.   losartan 100 MG tablet Commonly known as:  COZAAR Take 100 mg by mouth daily.   potassium chloride SA 20 MEQ tablet Commonly known as:  K-DUR,KLOR-CON Take 1 tablet (20 mEq total) by mouth daily.   Vitamin D 50 MCG (2000  UT) Caps Take 2,000 Units by mouth daily.       No Known Allergies  Consultations:  Gastroenterology Dr. Karilyn Cota   Procedures/Studies:  No results found. 08/18/2018 EGD: The proximal esophagus, mid esophagus and distal esophagus were normal. Findings: There were esophageal mucosal changes consistent with short-segment Barrett's esophagus present in the distal esophagus. The maximum longitudinal extent of these mucosal changes was 1 cm in length. A 2 cm hiatal hernia was present. The entire examined stomach was normal. The duodenal bulb, second portion of the duodenum, major papilla and third portion of the duodenum were  normal. Using the endoscope, the video capsule enteroscope was advanced into the second portion of the Duodenum.  08/19/2018 capsule endoscopy: Results pending    Subjective:   Discharge Exam: Vitals:   08/18/18 2120 08/19/18 0523  BP: (!) 143/75 (!) 163/89  Pulse: 80 72  Resp: 18 19  Temp: 98.4 F (36.9 C) 97.9 F (36.6 C)  SpO2: 97% 94%   Vitals:   08/18/18 1610 08/18/18 1627 08/18/18 2120 08/19/18 0523  BP:  (!) 157/78 (!) 143/75 (!) 163/89  Pulse: 75 68 80 72  Resp: 17 16 18 19   Temp:   98.4 F (36.9 C) 97.9 F (36.6 C)  TempSrc:   Oral Oral  SpO2: 97% 100% 97% 94%  Weight:      Height:        General: Pt is alert, awake, not in acute distress Cardiovascular: Regular rate and rhythm, no murmurs Respiratory: CTA bilaterally, no wheezing, no rhonchi Abdominal: Soft, NT, ND, bowel sounds + Extremities: no edema    The results of significant diagnostics from this hospitalization (including imaging, microbiology, ancillary and laboratory) are listed below for reference.     Microbiology: No results found for this or any previous visit (from the past 240 hour(s)).   Labs: BNP (last 3 results) Recent Labs    02/18/18 1230  BNP 283.0*   Basic Metabolic Panel: Recent Labs  Lab 08/17/18 1428 08/19/18 0437  NA 137 138  K 3.4* 3.2*  CL 104 106  CO2 23 23  GLUCOSE 91 91  BUN 16 15  CREATININE 1.35* 1.22  CALCIUM 9.0 9.2  MG 2.0  --    Liver Function Tests: Recent Labs  Lab 08/17/18 1428  AST 18  ALT 12  ALKPHOS 80  BILITOT 0.6  PROT 7.7  ALBUMIN 4.0   No results for input(s): LIPASE, AMYLASE in the last 168 hours. No results for input(s): AMMONIA in the last 168 hours. CBC: Recent Labs  Lab 08/17/18 1428 08/18/18 0414 08/19/18 0437  WBC 12.2* 14.4* 9.5  NEUTROABS 8.8*  --   --   HGB 7.2* 9.4* 9.9*  HCT 24.6* 31.2* 33.2*  MCV 73.9* 76.3* 76.9*  PLT 387 371 356   Cardiac Enzymes: No results for input(s): CKTOTAL, CKMB, CKMBINDEX,  TROPONINI in the last 168 hours. BNP: Invalid input(s): POCBNP CBG: No results for input(s): GLUCAP in the last 168 hours. D-Dimer No results for input(s): DDIMER in the last 72 hours. Hgb A1c No results for input(s): HGBA1C in the last 72 hours. Lipid Profile No results for input(s): CHOL, HDL, LDLCALC, TRIG, CHOLHDL, LDLDIRECT in the last 72 hours. Thyroid function studies No results for input(s): TSH, T4TOTAL, T3FREE, THYROIDAB in the last 72 hours.  Invalid input(s): FREET3 Anemia work up Recent Labs    08/17/18 1428  VITAMINB12 256  FOLATE 9.7  FERRITIN 3*  TIBC 441  IRON 13*  RETICCTPCT  2.2   Urinalysis    Component Value Date/Time   COLORURINE STRAW (A) 02/18/2018 1700   APPEARANCEUR CLEAR 02/18/2018 1700   LABSPEC 1.012 02/18/2018 1700   PHURINE 7.0 02/18/2018 1700   GLUCOSEU NEGATIVE 02/18/2018 1700   HGBUR NEGATIVE 02/18/2018 1700   BILIRUBINUR NEGATIVE 02/18/2018 1700   KETONESUR NEGATIVE 02/18/2018 1700   PROTEINUR NEGATIVE 02/18/2018 1700   UROBILINOGEN 1.0 11/01/2008 1425   NITRITE NEGATIVE 02/18/2018 1700   LEUKOCYTESUR NEGATIVE 02/18/2018 1700   Sepsis Labs Invalid input(s): PROCALCITONIN,  WBC,  LACTICIDVEN Microbiology No results found for this or any previous visit (from the past 240 hour(s)).   Time coordinating discharge: O35  SIGNED:   Delaine Lame, MD  [facility] 08/19/2018, 10:31 AM  If 7PM-7AM, please contact night-coverage [email or web address] Password [email or web address]

## 2018-08-19 NOTE — Plan of Care (Signed)
  Problem: Education: Goal: Knowledge of General Education information will improve Description Including pain rating scale, medication(s)/side effects and non-pharmacologic comfort measures 08/19/2018 1110 by Darreld Mclean, RN Outcome: Adequate for Discharge 08/19/2018 0828 by Darreld Mclean, RN Outcome: Progressing   Problem: Health Behavior/Discharge Planning: Goal: Ability to manage health-related needs will improve 08/19/2018 1110 by Darreld Mclean, RN Outcome: Adequate for Discharge 08/19/2018 0828 by Darreld Mclean, RN Outcome: Progressing   Problem: Clinical Measurements: Goal: Ability to maintain clinical measurements within normal limits will improve 08/19/2018 1110 by Darreld Mclean, RN Outcome: Adequate for Discharge 08/19/2018 0828 by Darreld Mclean, RN Outcome: Progressing Goal: Will remain free from infection 08/19/2018 1110 by Darreld Mclean, RN Outcome: Adequate for Discharge 08/19/2018 0828 by Darreld Mclean, RN Outcome: Progressing Goal: Diagnostic test results will improve 08/19/2018 1110 by Darreld Mclean, RN Outcome: Adequate for Discharge 08/19/2018 0828 by Darreld Mclean, RN Outcome: Progressing Goal: Respiratory complications will improve 08/19/2018 1110 by Darreld Mclean, RN Outcome: Adequate for Discharge 08/19/2018 0828 by Darreld Mclean, RN Outcome: Progressing Goal: Cardiovascular complication will be avoided 08/19/2018 1110 by Darreld Mclean, RN Outcome: Adequate for Discharge 08/19/2018 0828 by Darreld Mclean, RN Outcome: Progressing   Problem: Activity: Goal: Risk for activity intolerance will decrease 08/19/2018 1110 by Darreld Mclean, RN Outcome: Adequate for Discharge 08/19/2018 0828 by Darreld Mclean, RN Outcome: Progressing   Problem: Nutrition: Goal: Adequate nutrition will be maintained 08/19/2018 1110 by Darreld Mclean, RN Outcome: Adequate for Discharge 08/19/2018 0828 by Darreld Mclean, RN Outcome: Progressing   Problem: Coping: Goal: Level of anxiety will decrease 08/19/2018 1110 by Darreld Mclean, RN Outcome: Adequate for  Discharge 08/19/2018 0828 by Darreld Mclean, RN Outcome: Progressing   Problem: Elimination: Goal: Will not experience complications related to bowel motility 08/19/2018 1110 by Darreld Mclean, RN Outcome: Adequate for Discharge 08/19/2018 0828 by Darreld Mclean, RN Outcome: Progressing Goal: Will not experience complications related to urinary retention 08/19/2018 1110 by Darreld Mclean, RN Outcome: Adequate for Discharge 08/19/2018 0828 by Darreld Mclean, RN Outcome: Progressing   Problem: Pain Managment: Goal: General experience of comfort will improve 08/19/2018 1110 by Darreld Mclean, RN Outcome: Adequate for Discharge 08/19/2018 0828 by Darreld Mclean, RN Outcome: Progressing   Problem: Safety: Goal: Ability to remain free from injury will improve 08/19/2018 1110 by Darreld Mclean, RN Outcome: Adequate for Discharge 08/19/2018 0828 by Darreld Mclean, RN Outcome: Progressing   Problem: Skin Integrity: Goal: Risk for impaired skin integrity will decrease 08/19/2018 1110 by Darreld Mclean, RN Outcome: Adequate for Discharge 08/19/2018 0828 by Darreld Mclean, RN Outcome: Progressing

## 2018-08-19 NOTE — Discharge Instructions (Signed)
Upper Gastrointestinal Bleeding °Upper gastrointestinal (GI) bleeding is bleeding from the swallowing tube (esophagus), stomach, or the first part of the small intestine (duodenum). If you have upper GI bleeding, you may vomit blood or have bloody or black stools. °Bleeding can range from mild to serious or even life-threatening. If there is a lot of bleeding, you may need to stay in the hospital. °What are the causes? °This condition may be caused by: °· Ulcer disease of the stomach (peptic ulcer) or duodenum. This is the most common cause of GI bleeding. °· Inflammation, irritation, or swelling of the esophagus (esophagitis). °· A tear in the esophagus. °· Cancer of the esophagus, stomach, or duodenum. °· An abnormal or weakened blood vessel in one of the upper GI structures. °· A bleeding disorder that impairs the formation of blood clots and causes easy bleeding (coagulopathy). ° °What increases the risk? °The following factors may make you more likely to develop this condition: °· Being older than 55 years of age. °· Being male. °· Having another long-term disease, especially liver or kidney disease. °· Having a stomach infection caused by Helicobacter pylori bacteria. °· Having frequent or severe vomiting. °· Abusing alcohol. °· Taking certain medicines for a long time, such as: °? NSAIDs. °? Anticoagulants. ° °What are the signs or symptoms? °Symptoms of this condition include: °· Vomiting blood. °· Black or maroon-colored stools. °· Bloody stools. °· Weakness or dizziness. °· Heartburn. °· Abdominal pain. °· Difficulty swallowing. °· Weight loss. °· Yellow eyes or skin (jaundice). °· Racing heartbeat. ° °How is this diagnosed? °This condition may be diagnosed based on: °· Your symptoms and medical history. °· A physical exam. During the exam, your health care provider will check for signs of blood loss, such as low blood pressure and a rapid pulse. °· Tests, such as: °? Blood tests to measure your blood cell  count and to check for other signs of blood loss and clotting ability. °? Blood tests to check your liver and kidney function. °? A chest X-ray to look for a tear in the esophagus. °? Endoscopy. In this procedure, a flexible scope is put down your esophagus and into your stomach or duodenum to look for the source of bleeding. °? Angiogram. This may be done if the source of bleeding is not found during endoscopy. For an angiogram, X-rays are taken after a dye is injected into your bloodstream. °? Nasogastric tube insertion. This is a tube passed through your nose and down into your stomach. It may be connected to a source of gentle suction to see if any blood comes out. ° °How is this treated? °Treatment for this condition depends on the cause of the bleeding. Active bleeding is treated at the hospital. Treatment may include: °· Getting fluids through an IV tube inserted into one of your veins. °· Getting blood through an IV tube (blood transfusion). °· Getting high doses of medicine through the IV to lower stomach acid. This may be done to treat ulcer disease. °· Having endoscopy to treat an area of bleeding with high heat (coagulation), injections, or surgical clips. °· Having a procedure that involves first doing an angiogram and then blocking blood flow to the bleeding site (embolization). °· Stopping or changing some of your regular medicines for a certain amount of time. °· Having other surgical procedures if initial treatments do not control bleeding. ° °Follow these instructions at home: °· Take over-the-counter and prescription medicines only as told by your health care   provider. You may need to avoid NSAIDs or other medicines that increase bleeding. °· Do not drink alcohol. °· Drink enough fluid to keep your urine clear or pale yellow. °· Follow instructions from your health care provider about eating or drinking restrictions. °· Return to your normal activities as told by your health care provider. Ask  your health care provider what activities are safe for you. °· Do not use any tobacco products, such as cigarettes, chewing tobacco, and e-cigarettes. If you need help quitting, ask your health care provider. °· Keep all follow-up visits as told by your health care provider. This is important. °Contact a health care provider if: °· You have abdominal pain or heartburn. °· You have unexplained weight loss. °· You have trouble swallowing. °· You have frequent vomiting. °· You develop jaundice. °· You feel weak or dizzy. °· You need help to stop smoking or drinking alcohol. °Get help right away if: °· You have vomiting with blood. °· You have blood in your stools. °· You have severe cramps in your back or abdomen. °· Your symptoms of upper GI bleeding come back after treatment. °This information is not intended to replace advice given to you by your health care provider. Make sure you discuss any questions you have with your health care provider. °Document Released: 02/14/2016 Document Revised: 06/01/2016 Document Reviewed: 02/14/2016 °[employer]vier Interactive Patient Education © 2018 [employer] ° °

## 2018-08-19 NOTE — Op Note (Addendum)
REVIEWED Givens Study. IN 2018 NOTED TO HAVE Hb 11.6-12.9. 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 PRESENT IN NOV 2019 WITH Hb 7.2 MCV 73.9 AND GIVENS STUDY PERFORMED. 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 No active bleeding. Occasional erosion in the distal ileum.   PT'S SOURCE FOR FEDA/SLOW GI BLEED IS SMALL BOWEL AVMs. WILL DISCUSS WITH PT MANAGEMENT OPTIONS: 1. SEE HEMATOLOGY FOR IVFE/SERIAL CBCs AND IF HE CANNOT MAINTAIN HIS Hb HAVE ENTEROSCOPY, OR  2. HAVE ENTEROSCOPY TO CAUTERIZE AVMs AND CONTINUE PO IRON WITH CBC QMO AND IF HE CAN'T MAINTAIN HIS Hb SEE HEMATOLOGY FOR IVFE. REGARDLESS PT SHOULD AVOID ASA UNLESS IT IS MEDICALLY NECESSARY.  Small Bowel Givens Capsule Study Procedure date: August 19, 2018  Referring Provider:  Onnie Boer, MD, hospitalist PCP:  Dr. Wilson Singer, MD Primary gastroenterologist: Jonette Eva, MD  Indication for procedure: Profound symptomatic IDA, heme positive stools.  Patient first presented with profound iron deficiency anemia in May 2019.  He had melena at the time.  Noted to have erosive gastropathy/duodenal erosions, no H. pylori. Likely NSAID induced.  Mucosal changes of the esophagus,?  Short segment Barrett's however not confirmed on biopsy.   He presented with symptomatic anemia this admission, again found to have profound IDA, heme positive stool without overt GI bleeding.  EGD yesterday with esophageal findings most consistent with Barrett's, biopsies not performed.  Otherwise unremarkable stomach/duodenum through the third portion.  Capsule endoscopy placed to further evaluate IDA/heme positive stools.  Last colonoscopy May 2017, redundant colon, nonbleeding internal hemorrhoids.  Patient data:  Wt: 207 pounds  Findings: Some fresh blood noted in oropharynx during placement of capsule, likely trauma. At 11 minutes and 15 seconds, just after  capsule released during placement, likely second portion of duodenum, AVM noted with no active bleeding.  At 3 hours 37 minutes 27 seconds, single nonbleeding erosion noted. At 3 hours 43 minutes and 37 seconds, questionable ulcer, nonbleeding seen in the distal ileum.  First Gastric image:  N/A First Duodenal image: 11 minutes 1 second  First Ileo-Cecal Valve image: 3 hours 52 minutes 44 seconds First Cecal image: 3 hours 54 minutes 46 seconds Gastric Passage time: N/A Small Bowel Passage time: 3 hours 43 minutes   Summary & Recommendations: Profound IDA due to occult GI bleeding.  Single nonbleeding AVM noted in the duodenum.  Nonbleeding erosions seen in the distal small bowel as well as questionable ulcer noted the distal small bowel just before reaching the cecum.  1. Patient denied any NSAID or aspirin use.  Continue to avoid NSAIDs. 2. Recommend pantoprazole 40 mg, 30 minutes before breakfast daily indefinitely. 3. Patient may need EGD/push enteroscopy for AVM ablation if unable to maintain hemoglobin, to discuss with Dr. Darrick Penna. 4. Monitor hemoglobin periodically to make sure remains stable. Consider monthly for now.  5. Agree with oral iron therapy. If cannot maintain hemoglobin he may benefit from iron infusions.  6. Pertinent images to be reviewed by Dr. Darrick Penna.  Leanna Battles. Dixon [facility]os [facility] [phone] 11/7/201911:28 AM

## 2018-08-19 NOTE — Progress Notes (Signed)
IV removed, WNL. D/C instructions given to pt. Verbalized understanding. Pt family member at bedside to transport home.  

## 2018-08-19 NOTE — Plan of Care (Signed)

## 2018-08-25 ENCOUNTER — Encounter (HOSPITAL_COMMUNITY): Payer: Self-pay | Admitting: Internal Medicine

## 2018-08-30 ENCOUNTER — Other Ambulatory Visit: Payer: Self-pay

## 2018-08-30 ENCOUNTER — Encounter: Payer: Self-pay | Admitting: *Deleted

## 2018-08-30 ENCOUNTER — Other Ambulatory Visit: Payer: Self-pay | Admitting: *Deleted

## 2018-08-30 ENCOUNTER — Telehealth: Payer: Self-pay | Admitting: Gastroenterology

## 2018-08-30 DIAGNOSIS — K921 Melena: Secondary | ICD-10-CM

## 2018-08-30 DIAGNOSIS — D509 Iron deficiency anemia, unspecified: Secondary | ICD-10-CM

## 2018-08-30 DIAGNOSIS — D500 Iron deficiency anemia secondary to blood loss (chronic): Secondary | ICD-10-CM

## 2018-08-30 NOTE — Telephone Encounter (Addendum)
Patient called back. Scheduled for 09/17/18 at 3:15pm. Orders entered. Instructions mailed. Instructions were also discussed in detail with patient.

## 2018-08-30 NOTE — Telephone Encounter (Addendum)
Called pt, VM is full. WCB

## 2018-08-30 NOTE — Telephone Encounter (Signed)
Dr. Darrick PennaFields reviewed patient's Given's capsule. The below is from her documentation added to the op note from 08/19/18.   "REVIEWED Givens Study. IN 2018 NOTED TO HAVE Hb 11.6-12.9. 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 PRESENT IN NOV 2019 WITH Hb 7.2 MCV 73.9 AND GIVENS STUDY PERFORMED. 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 No active bleeding. Occasional erosion in the distal ileum.   PT'S SOURCE FOR FEDA/SLOW GI BLEED IS SMALL BOWEL AVMs. WILL DISCUSS WITH PT MANAGEMENT OPTIONS: 1. SEE HEMATOLOGY FOR IVFE/SERIAL CBCs AND IF HE CANNOT MAINTAIN HIS Hb HAVE ENTEROSCOPY, OR  2. HAVE ENTEROSCOPY TO CAUTERIZE AVMs AND CONTINUE PO IRON WITH CBC QMO AND IF HE CAN'T MAINTAIN HIS Hb SEE HEMATOLOGY FOR IVFE. REGARDLESS PT SHOULD AVOID ASA UNLESS IT IS MEDICALLY NECESSARY."    Dr. Darrick PennaFields did you want to call patient or did you need me to do it?

## 2018-08-30 NOTE — Telephone Encounter (Signed)
Spoke with patient. Went over results. Went over recommendations and options.   Patient would like to pursue enteroscopy to cauterize AVMs WITH SLF. PLEASE SCHEDULE.  Needs CBC first week of December.   Needs pantoprazole 40mg  daily before breakfast #30 11 refills. Stay on this due to esophagitis/?barrett's. I FORGOT TO TELL HIM THIS SO PLEASE INFORM.

## 2018-08-30 NOTE — Telephone Encounter (Signed)
Pt is aware of the protonix and [place] [facility] [place]  He is aware of the future labs and I will mail those orders to him.

## 2018-08-30 NOTE — Telephone Encounter (Signed)
You should call him.

## 2018-08-30 NOTE — Telephone Encounter (Signed)
Lab orders have been placed in the mail.

## 2018-09-13 ENCOUNTER — Other Ambulatory Visit (HOSPITAL_COMMUNITY)
Admission: RE | Admit: 2018-09-13 | Discharge: 2018-09-13 | Disposition: A | Discharge status: Home / Self Care | Payer: Medicare HMO | Source: Ambulatory Visit | Attending: Gastroenterology | Admitting: Gastroenterology

## 2018-09-13 DIAGNOSIS — D509 Iron deficiency anemia, unspecified: Secondary | ICD-10-CM | POA: Diagnosis not present

## 2018-09-13 LAB — CBC WITH DIFFERENTIAL/PLATELET
Abs Immature Granulocytes: 0.02 10*3/uL (ref 0.00–0.07)
BASOS PCT: 1 %
Basophils Absolute: 0.1 10*3/uL (ref 0.0–0.1)
EOS ABS: 0.3 10*3/uL (ref 0.0–0.5)
EOS PCT: 3 %
HCT: 38.5 % — ABNORMAL LOW (ref 39.0–52.0)
Hemoglobin: 11.5 g/dL — ABNORMAL LOW (ref 13.0–17.0)
Immature Granulocytes: 0 %
Lymphocytes Relative: 20 %
Lymphs Abs: 1.6 10*3/uL (ref 0.7–4.0)
MCH: 24.3 pg — ABNORMAL LOW (ref 26.0–34.0)
MCHC: 29.9 g/dL — AB (ref 30.0–36.0)
MCV: 81.4 fL (ref 80.0–100.0)
MONO ABS: 0.7 10*3/uL (ref 0.1–1.0)
Monocytes Relative: 8 %
Neutro Abs: 5.3 10*3/uL (ref 1.7–7.7)
Neutrophils Relative %: 68 %
PLATELETS: 287 10*3/uL (ref 150–400)
RBC: 4.73 MIL/uL (ref 4.22–5.81)
RDW: 24.2 % — ABNORMAL HIGH (ref 11.5–15.5)
WBC: 7.9 10*3/uL (ref 4.0–10.5)
nRBC: 0 % (ref 0.0–0.2)

## 2018-09-17 ENCOUNTER — Other Ambulatory Visit: Payer: Self-pay

## 2018-09-17 ENCOUNTER — Encounter (HOSPITAL_COMMUNITY)
Admission: RE | Disposition: A | Discharge status: Home / Self Care | Payer: Self-pay | Source: Ambulatory Visit | Attending: Gastroenterology

## 2018-09-17 ENCOUNTER — Encounter (HOSPITAL_COMMUNITY): Payer: Self-pay

## 2018-09-17 ENCOUNTER — Ambulatory Visit (HOSPITAL_COMMUNITY)
Admission: RE | Admit: 2018-09-17 | Discharge: 2018-09-17 | Disposition: A | Discharge status: Home / Self Care | Payer: Medicare HMO | Source: Ambulatory Visit | Attending: Gastroenterology | Admitting: Gastroenterology

## 2018-09-17 DIAGNOSIS — K2970 Gastritis, unspecified, without bleeding: Secondary | ICD-10-CM | POA: Diagnosis not present

## 2018-09-17 DIAGNOSIS — D509 Iron deficiency anemia, unspecified: Secondary | ICD-10-CM | POA: Diagnosis not present

## 2018-09-17 DIAGNOSIS — D500 Iron deficiency anemia secondary to blood loss (chronic): Secondary | ICD-10-CM

## 2018-09-17 DIAGNOSIS — K31819 Angiodysplasia of stomach and duodenum without bleeding: Secondary | ICD-10-CM | POA: Insufficient documentation

## 2018-09-17 DIAGNOSIS — K900 Celiac disease: Secondary | ICD-10-CM | POA: Diagnosis not present

## 2018-09-17 DIAGNOSIS — I10 Essential (primary) hypertension: Secondary | ICD-10-CM | POA: Insufficient documentation

## 2018-09-17 DIAGNOSIS — Z8673 Personal history of transient ischemic attack (TIA), and cerebral infarction without residual deficits: Secondary | ICD-10-CM | POA: Diagnosis not present

## 2018-09-17 DIAGNOSIS — Z79899 Other long term (current) drug therapy: Secondary | ICD-10-CM | POA: Diagnosis not present

## 2018-09-17 HISTORY — PX: ESOPHAGOGASTRODUODENOSCOPY: SHX5428

## 2018-09-17 HISTORY — PX: BIOPSY: SHX5522

## 2018-09-17 SURGERY — EGD (ESOPHAGOGASTRODUODENOSCOPY)
Anesthesia: Moderate Sedation

## 2018-09-17 MED ORDER — LIDOCAINE VISCOUS HCL 2 % MT SOLN
OROMUCOSAL | Status: AC
  Filled 2018-09-17: qty 15

## 2018-09-17 MED ORDER — SPOT INK MARKER SYRINGE KIT
PACK | SUBMUCOSAL | Status: AC
  Filled 2018-09-17: qty 5

## 2018-09-17 MED ORDER — STERILE WATER FOR IRRIGATION IR SOLN
Status: DC | PRN
  Administered 2018-09-17: 100 mL

## 2018-09-17 MED ORDER — GLUCAGON HCL (RDNA) 1 MG IJ SOLR
INTRAMUSCULAR | Status: DC | PRN
  Administered 2018-09-17: 1 mg via INTRAVENOUS

## 2018-09-17 MED ORDER — SODIUM CHLORIDE 0.9 % IV SOLN
INTRAVENOUS | Status: DC
  Administered 2018-09-17: 15:00:00 via INTRAVENOUS

## 2018-09-17 MED ORDER — HYDRALAZINE HCL 20 MG/ML IJ SOLN
INTRAMUSCULAR | Status: AC
  Filled 2018-09-17: qty 1

## 2018-09-17 MED ORDER — SPOT INK MARKER SYRINGE KIT
PACK | SUBMUCOSAL | Status: DC | PRN
  Administered 2018-09-17: 1 mL via SUBMUCOSAL

## 2018-09-17 MED ORDER — MEPERIDINE HCL 100 MG/ML IJ SOLN
INTRAMUSCULAR | Status: AC
  Filled 2018-09-17: qty 2

## 2018-09-17 MED ORDER — HYDRALAZINE HCL 20 MG/ML IJ SOLN
INTRAMUSCULAR | Status: DC | PRN
  Administered 2018-09-17: 10 mg via INTRAVENOUS

## 2018-09-17 MED ORDER — GLUCAGON HCL RDNA (DIAGNOSTIC) 1 MG IJ SOLR
INTRAMUSCULAR | Status: AC
  Filled 2018-09-17: qty 1

## 2018-09-17 MED ORDER — MEPERIDINE HCL 100 MG/ML IJ SOLN
INTRAMUSCULAR | Status: DC | PRN
  Administered 2018-09-17: 25 mg
  Administered 2018-09-17: 50 mg

## 2018-09-17 MED ORDER — MIDAZOLAM HCL 5 MG/5ML IJ SOLN
INTRAMUSCULAR | Status: DC | PRN
  Administered 2018-09-17 (×2): 2 mg via INTRAVENOUS

## 2018-09-17 MED ORDER — MIDAZOLAM HCL 5 MG/5ML IJ SOLN
INTRAMUSCULAR | Status: AC
  Filled 2018-09-17: qty 10

## 2018-09-17 NOTE — H&P (Signed)
Primary Care Physician:  Wilson SingerGosrani, Nimish C, MD Primary Gastroenterologist:  Dr. Darrick PennaFields  Pre-Procedure History & Physical: HPI:  Scott Ritter is a 57 y.o. male here for OBSCURE GI BLEED: FEDA/AVMs.  Past Medical History:  Diagnosis Date  . Hypertension   . Stroke [facility](HCC)     Past Surgical History:  Procedure Laterality Date  . BACK SURGERY    . COLONOSCOPY N/A 02/29/2016   Dr. Darrick PennaFields: redundant colon, non-bleeding internal hemorrhoids   . ESOPHAGOGASTRODUODENOSCOPY N/A 12/17/2016   Dr. Darrick Pennafields: 3 mm nonbleeding Mallory-Weiss tear.  EGD performed for hematemesis.  Hemoglobin normal.  . ESOPHAGOGASTRODUODENOSCOPY N/A 02/19/2018   Dr. Jena Gaussourk: Performed for melena, hemoglobin of 6.  Mucosal changes in the esophagus query short segment Barrett's, biopsy more consistent with reflux changes.  Erosive gastropathy but no H. pylori.  Duodenal erosions.  Suspected NSAID induced injury.  . ESOPHAGOGASTRODUODENOSCOPY  08/18/2018   Dr. Karilyn Cotaehman: IDA/heme positive stool.  Esophageal mucosal changes consistent with short segment Barrett's esophagus, not biopsied.  2 cm hiatal hernia.  Duodenal bulb, second portion of duodenum, third portion of the duodenum.  Video capsule somewhat difficult to pass through the oropharynx but was eventually advanced into the second portion of the duodenum and released.  . ESOPHAGOGASTRODUODENOSCOPY N/A 08/18/2018   Procedure: ESOPHAGOGASTRODUODENOSCOPY (EGD);  Surgeon: Malissa Hippoehman, Najeeb U, MD;  Location: [facility];  Service: Endoscopy;  Laterality: N/A;  . GIVENS CAPSULE STUDY N/A 08/18/2018   Procedure: GIVENS CAPSULE STUDY;  Surgeon: Malissa Hippoehman, Najeeb U, MD;  Location: [facility];  Service: Endoscopy;  Laterality: N/A;  . HERNIA REPAIR      Prior to Admission medications   Medication Sig Start Date End Date Taking? Authorizing Provider  amLODipine (NORVASC) 10 MG tablet Take 10 mg by mouth daily.   Yes [provider]  atorvastatin (LIPITOR) 20 MG tablet  Take 20 mg by mouth every evening.  05/04/15  Yes [provider]  Cholecalciferol (VITAMIN D) 2000 units CAPS Take 2,000 Units by mouth daily.   Yes [provider]  ferrous sulfate 325 (65 FE) MG tablet Take 1 tablet (325 mg total) by mouth daily with breakfast. 08/20/18  Yes Purohit, Salli QuarryShrey C, MD  hydrALAZINE (APRESOLINE) 25 MG tablet Take 25 mg by mouth 3 (three) times daily.  06/08/18  Yes [provider]  losartan (COZAAR) 100 MG tablet Take 100 mg by mouth daily. 06/10/18  Yes [provider]  pantoprazole (PROTONIX) 40 MG tablet Take 40 mg by mouth daily.   Yes [provider]  potassium chloride SA (K-DUR,KLOR-CON) 20 MEQ tablet Take 1 tablet (20 mEq total) by mouth daily. Patient taking differently: Take 20 mEq by mouth 2 (two) times daily.  12/17/16  Yes Erick BlinksMemon, Jehanzeb, MD    Allergies as of 08/30/2018  . (No Known Allergies)    Family History  Problem Relation Age of Onset  . Hypertension Father   . Colon cancer Neg Hx   . Colon polyps Neg Hx     Social History   Socioeconomic History  . Marital status: Married    Spouse name: Not on file  . Number of children: Not on file  . Years of education: Not on file  . Highest education level: Not on file  Occupational History  . Not on file  Social Needs  . Financial resource strain: Not on file  . Food insecurity:    Worry: Not on file    Inability: Not on file  .  Transportation needs:    Medical: Not on file    Non-medical: Not on file  Tobacco Use  . Smoking status: Never Smoker  . Smokeless tobacco: Never Used  Substance and Sexual Activity  . Alcohol use: Yes    Comment: occasionally  . Drug use: No  . Sexual activity: Not on file  Lifestyle  . Physical activity:    Days per week: Not on file    Minutes per session: Not on file  . Stress: Not on file  Relationships  . Social connections:    Talks on phone: Not on file    Gets together: Not on file    Attends  religious service: Not on file    Active member of club or organization: Not on file    Attends meetings of clubs or organizations: Not on file    Relationship status: Not on file  . Intimate partner violence:    Fear of current or ex partner: Not on file    Emotionally abused: Not on file    Physically abused: Not on file    Forced sexual activity: Not on file  Other Topics Concern  . Not on file  Social History Narrative  . Not on file    Review of Systems: See HPI, otherwise negative ROS   Physical Exam: BP (!) 175/89   Pulse 70   Temp 97.7 F (36.5 C) (Oral)   Resp 14   Ht 5\' 7"  (1.702 m)   Wt 96.2 kg   SpO2 98%   BMI 33.20 kg/m  General:   Alert,  pleasant and cooperative in NAD Head:  Normocephalic and atraumatic. Neck:  Supple; Lungs:  Clear throughout to auscultation.    Heart:  Regular rate and rhythm. Abdomen:  Soft, nontender and nondistended. Normal bowel sounds, without guarding, and without rebound.   Neurologic:  Alert and  oriented x4;  grossly normal neurologically.  Impression/Plan:      OBSCURE GI BLEED: FEDA   PLAN:  1. ENTEROSCOPY TODAY DISCUSSED PROCEDURE, BENEFITS, & RISKS: < 1% chance of medication reaction, bleeding, OR perforation.

## 2018-09-17 NOTE — Op Note (Signed)
[facility] Patient Name: Scott Ritter Procedure Date: 09/17/2018 3:46 PM MRN: [id number] Date of Birth: 1961-03-06 Attending MD: Jonette EvaSandi Kalyna Paolella MD, MD CSN: [id number] Age: [age] Admit Type: Outpatient Procedure:                Upper GI endoscopy WITH COLD FORCEPS/APC Indications:              Iron deficiency anemia due to suspected upper                            gastrointestinal bleeding Providers:                Jonette EvaSandi Curren Mohrmann MD, MD, Edrick Kinsammy Vaught, RN, Burke Keelsrisann                            Tilley, Technician Referring MD:             Wilson SingerNimish C. Gosrani MD, MD Medicines:                Meperidine 75 mg IV, Midazolam 4 mg IV Complications:            No immediate complications. Estimated Blood Loss:     Estimated blood loss was minimal. Procedure:                Pre-Anesthesia Assessment:                           - Prior to the procedure, a History and Physical                            was performed, and patient medications and                            allergies were reviewed. The patient's tolerance of                            previous anesthesia was also reviewed. The risks                            and benefits of the procedure and the sedation                            options and risks were discussed with the patient.                            All questions were answered, and informed consent                            was obtained. Prior Anticoagulants: The patient has                            taken no previous anticoagulant or antiplatelet                            agents. ASA Grade Assessment: II - A patient with  mild systemic disease. After reviewing the risks                            and benefits, the patient was deemed in                            satisfactory condition to undergo the procedure.                            After obtaining informed consent, the endoscope was                            passed under direct vision.  Throughout the                            procedure, the patient's blood pressure, pulse, and                            oxygen saturations were monitored continuously. The                            [id number]-[id number] ([id number]) scope was introduced through                            the mouth, and advanced to the proximal jejunum.                            The upper GI endoscopy was accomplished without                            difficulty. The patient tolerated the procedure                            well. Scope In: 4:13:28 PM Scope Out: 4:32:42 PM Total Procedure Duration: 0 hours 19 minutes 14 seconds  Findings:      The examined esophagus was normal.      Localized mild inflammation characterized by congestion (edema) and       erythema was found in the gastric antrum.      Two small angioectasias without bleeding were found in the second       portion of the duodenum. Coagulation for bleeding prevention using argon       plasma at 0.8 liters/minute and 20 watts was successful. Biopsies(4) for       histology were taken with a cold forceps for evaluation of celiac       disease.      The duodenal bulb was normal. Biopsies(2) for histology were taken with       a cold forceps for evaluation of celiac disease.      The examined jejunum was normal. Area was tattooed with an injection of       1 mL of Spot (carbon black). Impression:               - MILD Gastritis.                           -  Two non-bleeding angioectasias in the duodenum.                            Treated with argon plasma coagulation (APC).                            Biopsied. Moderate Sedation:      Moderate (conscious) sedation was administered by the endoscopy nurse       and supervised by the endoscopist. The following parameters were       monitored: oxygen saturation, heart rate, blood pressure, and response       to care. Total physician intraservice time was 30 minutes. Recommendation:           - Patient has  a contact number available for                            emergencies. The signs and symptoms of potential                            delayed complications were discussed with the                            patient. Return to normal activities tomorrow.                            Written discharge instructions were provided to the                            patient.                           - Resume previous diet.                           - Continue present medications.                           - Await pathology results.                           - Return to my office in 4 months. Procedure Code(s):        --- Professional ---                           43255, 59, Esophagogastroduodenoscopy, flexible,                            transoral; with control of bleeding, any method                           43239, Esophagogastroduodenoscopy, flexible,                            transoral; with biopsy, single or multiple                           43236, 59,  Esophagogastroduodenoscopy, flexible,                            transoral; with directed submucosal injection(s),                            any substance                           99153, Moderate sedation; each additional 15                            minutes intraservice time                           G0500, Moderate sedation services provided by the                            same physician or other qualified health care                            professional performing a gastrointestinal                            endoscopic service that sedation supports,                            requiring the presence of an independent trained                            observer to assist in the monitoring of the                            patient's level of consciousness and physiological                            status; initial 15 minutes of intra-service time;                            patient age 55 years or older (additional time may                             be reported with 16109, as appropriate) Diagnosis Code(s):        --- Professional ---                           K29.70, Gastritis, unspecified, without bleeding                           K31.819, Angiodysplasia of stomach and duodenum                            without bleeding                           D50.9, Iron deficiency anemia, unspecified CPT copyright 2018 American  Medical Association. All rights reserved. The codes documented in this report are preliminary and upon coder review may  be revised to meet current compliance requirements. Jonette Eva, MD Jonette Eva MD, MD 09/17/2018 4:54:43 PM This report has been signed electronically. Number of Addenda: 0

## 2018-09-17 NOTE — Discharge Instructions (Signed)
You have mild gastritis due to aspirin. 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  I PLACED A TATTOO IN YOUR SMALL BOWEL TO MARK HOW FAR DOWN IN THE SMALL BOWEL I WAS ABLE TO SEE. I biopsied your small bowel.    DRINK WATER TO KEEP YOUR URINE LIGHT YELLOW.  YOUR BIOPSY RESULTS WILL BE BACK IN 5 BUSINESS DAYS.  YOU NEEDS A CBC IN JAN 2020.  PLEASE CALL WITH QUESTIONS OR CONCERNS.  FOLLOW UP IN 4 MOS.  UPPER ENDOSCOPY AFTER CARE Read the instructions outlined below and refer to this sheet in the next week. These discharge instructions provide you with general information on caring for yourself after you leave the hospital. While your treatment has been planned according to the most current medical practices available, unavoidable complications occasionally occur. If you have any problems or questions after discharge, call DR. Vash Quezada, [phone].  ACTIVITY  You may resume your regular activity, but move at a slower pace for the next 24 hours.   Take frequent rest periods for the next 24 hours.   Walking will help get rid of the air and reduce the bloated feeling in your belly (abdomen).   No driving for 24 hours (because of the medicine (anesthesia) used during the test).   You may shower.   Do not sign any important legal documents or operate any machinery for 24 hours (because of the anesthesia used during the test).    NUTRITION  Drink plenty of fluids.   You may resume your normal diet as instructed by your doctor.   Begin with a light meal and progress to your normal diet. Heavy or fried foods are harder to digest and may make you feel sick to your stomach (nauseated).   Avoid alcoholic beverages for 24 hours or as instructed.    MEDICATIONS  You may resume your normal medications.   WHAT YOU CAN EXPECT TODAY  Some feelings of bloating in the abdomen.   Passage of more gas than usual.    IF YOU HAD A BIOPSY TAKEN DURING THE UPPER  ENDOSCOPY:  Eat a soft diet IF YOU HAVE NAUSEA, BLOATING, ABDOMINAL PAIN, OR VOMITING.    FINDING OUT THE RESULTS OF YOUR TEST Not all test results are available during your visit. DR. Darrick PennaFIELDS WILL CALL YOU WITHIN 14 DAYS OF YOUR PROCEDUE WITH YOUR RESULTS. Do not assume everything is normal if you have not heard from DR. Jalyric Kaestner, CALL HER OFFICE AT [phone].  SEEK IMMEDIATE MEDICAL ATTENTION AND CALL THE OFFICE: [phone] IF:  You have more than a spotting of blood in your stool.   Your belly is swollen (abdominal distention).   You are nauseated or vomiting.   You have a temperature over 101F.   You have abdominal pain or discomfort that is severe or gets worse throughout the day.  Arteriovenous Malformation An arteriovenous malformation (AVM) is a disorder that CAUSE BLOOD VESSELS TO BE ON THE SURFACE OF YOUR GI TRACT. It is characterized by a complex, tangled web of arteries and veins THAT HAVE A TENDENCY TO BLEED. An AVM may occur in the STOMACH, COLON, OR SMALL BOWEL.  SYMPTOMS  The most common problems (symptoms) of AVM include:  Bleeding (hemorrhaging).   ANEMIA(LOW IRON/BLOOD COUNT)  TREATMENT  The treatment for AVMs: **ABLATION WITH HEAT   Gastritis  Gastritis is an inflammation (the body's way of reacting to injury and/or infection) of the stomach. It is often caused by viral or bacterial (germ) infections. It  can also be caused BY ASPIRIN, BC/GOODY POWDER'S, (IBUPROFEN) MOTRIN, OR ALEVE (NAPROXEN), chemicals (including alcohol), SPICY FOODS, and medications. This illness may be associated with generalized malaise (feeling tired, not well), UPPER ABDOMINAL STOMACH cramps, and fever. One common bacterial cause of gastritis is an organism known as H. Pylori. This can be treated with antibiotics.

## 2018-09-22 ENCOUNTER — Other Ambulatory Visit: Payer: Self-pay

## 2018-09-22 ENCOUNTER — Telehealth: Payer: Self-pay | Admitting: Gastroenterology

## 2018-09-22 ENCOUNTER — Encounter (HOSPITAL_COMMUNITY): Payer: Self-pay | Admitting: Gastroenterology

## 2018-09-22 DIAGNOSIS — D649 Anemia, unspecified: Secondary | ICD-10-CM

## 2018-09-22 NOTE — Telephone Encounter (Signed)
PLEASE CALL PT.  HIS SMALL BOWEL BIOPSIES ARE NORMAL.    DRINK WATER TO KEEP YOUR URINE LIGHT YELLOW.  HE NEEDS A CBC IN JAN 2020.  PLEASE CALL WITH QUESTIONS OR CONCERNS.  FOLLOW UP IN 4 MOS.

## 2018-09-22 NOTE — Telephone Encounter (Signed)
Pt is aware and lab order on file for Jan.

## 2018-09-23 ENCOUNTER — Encounter: Payer: Self-pay | Admitting: Gastroenterology

## 2018-09-23 NOTE — Telephone Encounter (Signed)
PATIENT SCHEDULED AND LETTER SENT  °

## 2018-09-28 NOTE — Progress Notes (Signed)
PT is aware.

## 2018-10-18 DIAGNOSIS — E876 Hypokalemia: Secondary | ICD-10-CM | POA: Diagnosis not present

## 2018-10-18 DIAGNOSIS — I10 Essential (primary) hypertension: Secondary | ICD-10-CM | POA: Diagnosis not present

## 2018-10-18 DIAGNOSIS — D649 Anemia, unspecified: Secondary | ICD-10-CM | POA: Diagnosis not present

## 2018-10-19 ENCOUNTER — Telehealth: Payer: Self-pay | Admitting: Gastroenterology

## 2018-10-19 NOTE — Telephone Encounter (Signed)
Patient brought copy of his labs and I put them on your chair

## 2018-10-21 NOTE — Telephone Encounter (Signed)
HAVE LESLIE REVIEW LABS IN MY ABSENCE.

## 2018-10-22 ENCOUNTER — Other Ambulatory Visit: Payer: Self-pay

## 2018-10-22 DIAGNOSIS — D649 Anemia, unspecified: Secondary | ICD-10-CM

## 2018-10-22 NOTE — Telephone Encounter (Signed)
Pt is aware and lab orders on file for 11/22/2018.

## 2018-10-22 NOTE — Telephone Encounter (Signed)
Labs dated 10/18/2018  Glucose 100, BUN 15, creatinine 1.21 entheses normal range), GFR 78, sodium 138, potassium 3.8, calcium 9.5, white blood cell count 8200, hemoglobin 11.8 (normal 13.2-17.1), hematocrit 37.4 (normal 38.5-50), MCV 82.4, platelets 281,000.  Labs from 09/13/2018 with hemoglobin 11.5, hematocrit 38.5.   1. Please let patient know that his hemoglobin is stable to slightly improved.  Still not within normal range. 2. Please let patient know to keep appointment in April. 3. Repeat CBC, ferritin in 1 month. 4. Increase ferrous sulfate to 325mg  BID.

## 2018-10-26 NOTE — Telephone Encounter (Signed)
REVIEWED-NO ADDITIONAL RECOMMENDATIONS. 

## 2018-11-01 DIAGNOSIS — I10 Essential (primary) hypertension: Secondary | ICD-10-CM | POA: Diagnosis not present

## 2018-11-09 ENCOUNTER — Telehealth: Payer: Self-pay

## 2018-11-09 NOTE — Telephone Encounter (Addendum)
Pt brought lab results by that he had done at Dr. Lewis Moccasin. I am placing them in box for review by Tana Coast, PA.

## 2018-11-22 ENCOUNTER — Other Ambulatory Visit (HOSPITAL_COMMUNITY)
Admission: RE | Admit: 2018-11-22 | Discharge: 2018-11-22 | Disposition: A | Discharge status: Home / Self Care | Payer: Medicare HMO | Source: Ambulatory Visit | Attending: Gastroenterology | Admitting: Gastroenterology

## 2018-11-22 DIAGNOSIS — D649 Anemia, unspecified: Secondary | ICD-10-CM | POA: Diagnosis not present

## 2018-11-22 LAB — CBC WITH DIFFERENTIAL/PLATELET
Abs Immature Granulocytes: 0.02 10*3/uL (ref 0.00–0.07)
Basophils Absolute: 0 10*3/uL (ref 0.0–0.1)
Basophils Relative: 0 %
Eosinophils Absolute: 0.5 10*3/uL (ref 0.0–0.5)
Eosinophils Relative: 6 %
HCT: 41.3 % (ref 39.0–52.0)
Hemoglobin: 12.7 g/dL — ABNORMAL LOW (ref 13.0–17.0)
Immature Granulocytes: 0 %
Lymphocytes Relative: 18 %
Lymphs Abs: 1.3 10*3/uL (ref 0.7–4.0)
MCH: 25.9 pg — ABNORMAL LOW (ref 26.0–34.0)
MCHC: 30.8 g/dL (ref 30.0–36.0)
MCV: 84.3 fL (ref 80.0–100.0)
Monocytes Absolute: 0.8 10*3/uL (ref 0.1–1.0)
Monocytes Relative: 12 %
Neutro Abs: 4.4 10*3/uL (ref 1.7–7.7)
Neutrophils Relative %: 64 %
Platelets: 229 10*3/uL (ref 150–400)
RBC: 4.9 MIL/uL (ref 4.22–5.81)
RDW: 18.3 % — ABNORMAL HIGH (ref 11.5–15.5)
WBC: 7 10*3/uL (ref 4.0–10.5)
nRBC: 0 % (ref 0.0–0.2)

## 2018-11-22 LAB — FERRITIN: Ferritin: 48 ng/mL (ref 24–336)

## 2018-11-23 NOTE — Telephone Encounter (Signed)
Patient brought in the same labs we had already reviewed from earlier in January.  see telephone note dated 10/19/2018

## 2018-11-29 ENCOUNTER — Other Ambulatory Visit: Payer: Self-pay

## 2018-11-29 DIAGNOSIS — D509 Iron deficiency anemia, unspecified: Secondary | ICD-10-CM

## 2018-11-29 NOTE — Progress Notes (Signed)
PT is aware. He would like to just have [place] do his labs . Lab orders entered next for May 2020. Pt said he needs refill on his iron tablets and he is taking bid now. Sending to Tana Coast, PA to address.

## 2018-12-01 ENCOUNTER — Other Ambulatory Visit: Payer: Self-pay | Admitting: Gastroenterology

## 2018-12-01 MED ORDER — FERROUS SULFATE 325 (65 FE) MG PO TABS: 325.0000 mg | ORAL_TABLET | Freq: Two times a day (BID) | ORAL | 3 refills | Status: DC

## 2018-12-06 NOTE — Progress Notes (Signed)
PT is aware.

## 2018-12-06 NOTE — Progress Notes (Signed)
Verlon Au, please send in refill on iron tablets.

## 2018-12-30 DIAGNOSIS — E559 Vitamin D deficiency, unspecified: Secondary | ICD-10-CM | POA: Diagnosis not present

## 2018-12-30 DIAGNOSIS — I10 Essential (primary) hypertension: Secondary | ICD-10-CM | POA: Diagnosis not present

## 2018-12-30 DIAGNOSIS — D649 Anemia, unspecified: Secondary | ICD-10-CM | POA: Diagnosis not present

## 2018-12-30 DIAGNOSIS — E785 Hyperlipidemia, unspecified: Secondary | ICD-10-CM | POA: Diagnosis not present

## 2018-12-31 ENCOUNTER — Encounter (INDEPENDENT_AMBULATORY_CARE_PROVIDER_SITE_OTHER): Payer: Self-pay | Admitting: Internal Medicine

## 2019-01-13 ENCOUNTER — Other Ambulatory Visit: Payer: Self-pay

## 2019-01-13 DIAGNOSIS — D509 Iron deficiency anemia, unspecified: Secondary | ICD-10-CM

## 2019-01-20 ENCOUNTER — Telehealth: Payer: Self-pay | Admitting: Gastroenterology

## 2019-01-20 NOTE — Telephone Encounter (Signed)
PLEASE CALL PATIENT, HE HAS BEEN TAKING HIS IRON PILLS AND THEY HAVE MADE HIS STOOL GREEN, HE WANTS TO KNOW IF THIS IS NORMAL.

## 2019-01-20 NOTE — Telephone Encounter (Signed)
PLEASE CALL PT. HIS IRON PILLS CAN CAUSE HIS STOOLS TO BE GREEN OR BLACK. HE SHOULD CALLIF HIS STOOLS ARE BLACK AND STICKY LIKE TAR.

## 2019-01-20 NOTE — Telephone Encounter (Signed)
Noted. Pt notified of how his stool can be while on iron and will call back if his stool changes.

## 2019-01-20 NOTE — Telephone Encounter (Signed)
Spoke with pt. He currently takes iron tablets twice daily. When he increased his iron pill to twice daily, he noticed his stool turned a green color. Pt doesn't feel that it is dark but states it's green. Pt was asked if he has had anything with dye in it or food/veggies similar to the color green that he is referencing to. Pt answered no to the eating anything close to that color or blue. Pt said his bowel movements are normal. Please advise on the change in color.

## 2019-01-24 ENCOUNTER — Encounter (HOSPITAL_COMMUNITY): Payer: Self-pay | Admitting: Emergency Medicine

## 2019-01-24 ENCOUNTER — Inpatient Hospital Stay (HOSPITAL_COMMUNITY)
Admission: EM | Admit: 2019-01-24 | Discharge: 2019-01-26 | DRG: 305 | Disposition: A | Discharge status: Home / Self Care | Payer: Medicare HMO | Attending: Internal Medicine | Admitting: Internal Medicine

## 2019-01-24 ENCOUNTER — Emergency Department (HOSPITAL_COMMUNITY): Payer: Medicare HMO

## 2019-01-24 ENCOUNTER — Other Ambulatory Visit: Payer: Self-pay

## 2019-01-24 DIAGNOSIS — Z8719 Personal history of other diseases of the digestive system: Secondary | ICD-10-CM

## 2019-01-24 DIAGNOSIS — E785 Hyperlipidemia, unspecified: Secondary | ICD-10-CM | POA: Diagnosis present

## 2019-01-24 DIAGNOSIS — I471 Supraventricular tachycardia, unspecified: Secondary | ICD-10-CM

## 2019-01-24 DIAGNOSIS — I69354 Hemiplegia and hemiparesis following cerebral infarction affecting left non-dominant side: Secondary | ICD-10-CM | POA: Diagnosis not present

## 2019-01-24 DIAGNOSIS — R0789 Other chest pain: Secondary | ICD-10-CM | POA: Diagnosis not present

## 2019-01-24 DIAGNOSIS — K5520 Angiodysplasia of colon without hemorrhage: Secondary | ICD-10-CM | POA: Diagnosis present

## 2019-01-24 DIAGNOSIS — K2960 Other gastritis without bleeding: Secondary | ICD-10-CM | POA: Diagnosis present

## 2019-01-24 DIAGNOSIS — E782 Mixed hyperlipidemia: Secondary | ICD-10-CM | POA: Diagnosis not present

## 2019-01-24 DIAGNOSIS — I161 Hypertensive emergency: Secondary | ICD-10-CM | POA: Diagnosis not present

## 2019-01-24 DIAGNOSIS — Z79899 Other long term (current) drug therapy: Secondary | ICD-10-CM | POA: Diagnosis not present

## 2019-01-24 DIAGNOSIS — E876 Hypokalemia: Secondary | ICD-10-CM | POA: Diagnosis present

## 2019-01-24 DIAGNOSIS — N179 Acute kidney failure, unspecified: Secondary | ICD-10-CM | POA: Diagnosis not present

## 2019-01-24 DIAGNOSIS — I1310 Hypertensive heart and chronic kidney disease without heart failure, with stage 1 through stage 4 chronic kidney disease, or unspecified chronic kidney disease: Secondary | ICD-10-CM

## 2019-01-24 DIAGNOSIS — I129 Hypertensive chronic kidney disease with stage 1 through stage 4 chronic kidney disease, or unspecified chronic kidney disease: Secondary | ICD-10-CM | POA: Diagnosis present

## 2019-01-24 DIAGNOSIS — Z8249 Family history of ischemic heart disease and other diseases of the circulatory system: Secondary | ICD-10-CM

## 2019-01-24 DIAGNOSIS — D500 Iron deficiency anemia secondary to blood loss (chronic): Secondary | ICD-10-CM | POA: Diagnosis present

## 2019-01-24 DIAGNOSIS — R778 Other specified abnormalities of plasma proteins: Secondary | ICD-10-CM

## 2019-01-24 DIAGNOSIS — D509 Iron deficiency anemia, unspecified: Secondary | ICD-10-CM | POA: Diagnosis present

## 2019-01-24 DIAGNOSIS — N182 Chronic kidney disease, stage 2 (mild): Secondary | ICD-10-CM | POA: Diagnosis present

## 2019-01-24 DIAGNOSIS — R079 Chest pain, unspecified: Secondary | ICD-10-CM

## 2019-01-24 DIAGNOSIS — R7989 Other specified abnormal findings of blood chemistry: Secondary | ICD-10-CM

## 2019-01-24 DIAGNOSIS — R0602 Shortness of breath: Secondary | ICD-10-CM | POA: Diagnosis not present

## 2019-01-24 DIAGNOSIS — I371 Nonrheumatic pulmonary valve insufficiency: Secondary | ICD-10-CM | POA: Diagnosis not present

## 2019-01-24 LAB — URINALYSIS, COMPLETE (UACMP) WITH MICROSCOPIC
Bacteria, UA: NONE SEEN
Bilirubin Urine: NEGATIVE
Glucose, UA: NEGATIVE mg/dL
Hgb urine dipstick: NEGATIVE
Ketones, ur: NEGATIVE mg/dL
Leukocytes,Ua: NEGATIVE
Nitrite: NEGATIVE
Protein, ur: NEGATIVE mg/dL
Specific Gravity, Urine: 1.017 (ref 1.005–1.030)
pH: 6 (ref 5.0–8.0)

## 2019-01-24 LAB — D-DIMER, QUANTITATIVE (NOT AT ARMC): D-Dimer, Quant: <0.27 ug/mL-FEU (ref 0.00–0.50)

## 2019-01-24 LAB — BASIC METABOLIC PANEL
Anion gap: 9 (ref 5–15)
BUN: 24 mg/dL — ABNORMAL HIGH (ref 6–20)
CO2: 25 mmol/L (ref 22–32)
Calcium: 8.9 mg/dL (ref 8.9–10.3)
Chloride: 104 mmol/L (ref 98–111)
Creatinine, Ser: 1.65 mg/dL — ABNORMAL HIGH (ref 0.61–1.24)
GFR calc Af Amer: 53 mL/min — ABNORMAL LOW (ref >60)
GFR calc non Af Amer: 46 mL/min — ABNORMAL LOW (ref >60)
Glucose, Bld: 111 mg/dL — ABNORMAL HIGH (ref 70–99)
Potassium: 3.1 mmol/L — ABNORMAL LOW (ref 3.5–5.1)
Sodium: 138 mmol/L (ref 135–145)

## 2019-01-24 LAB — RAPID URINE DRUG SCREEN, HOSP PERFORMED
Amphetamines: NOT DETECTED
Barbiturates: NOT DETECTED
Benzodiazepines: NOT DETECTED
Cocaine: NOT DETECTED
Opiates: NOT DETECTED
Tetrahydrocannabinol: NOT DETECTED

## 2019-01-24 LAB — CBC
HCT: 37.5 % — ABNORMAL LOW (ref 39.0–52.0)
Hemoglobin: 12 g/dL — ABNORMAL LOW (ref 13.0–17.0)
MCH: 27.5 pg (ref 26.0–34.0)
MCHC: 32 g/dL (ref 30.0–36.0)
MCV: 86 fL (ref 80.0–100.0)
Platelets: 252 10*3/uL (ref 150–400)
RBC: 4.36 MIL/uL (ref 4.22–5.81)
RDW: 16 % — ABNORMAL HIGH (ref 11.5–15.5)
WBC: 11 10*3/uL — ABNORMAL HIGH (ref 4.0–10.5)
nRBC: 0 % (ref 0.0–0.2)

## 2019-01-24 LAB — MAGNESIUM: Magnesium: 1.9 mg/dL (ref 1.7–2.4)

## 2019-01-24 LAB — TROPONIN I
Troponin I: 0.04 ng/mL (ref <0.03)
Troponin I: 0.04 ng/mL (ref <0.03)

## 2019-01-24 LAB — MRSA PCR SCREENING: MRSA by PCR: NEGATIVE

## 2019-01-24 MED ORDER — CHLORHEXIDINE GLUCONATE CLOTH 2 % EX PADS
6.0000 | MEDICATED_PAD | Freq: Every day | CUTANEOUS | Status: DC
  Administered 2019-01-25 – 2019-01-26 (×2): 6 via TOPICAL

## 2019-01-24 MED ORDER — NITROGLYCERIN 2 % TD OINT: 1.0000 [in_us] | TOPICAL_OINTMENT | Freq: Once | TRANSDERMAL | Status: DC

## 2019-01-24 MED ORDER — ASPIRIN 81 MG PO CHEW
324.0000 mg | CHEWABLE_TABLET | Freq: Once | ORAL | Status: AC
  Administered 2019-01-24: 324 mg via ORAL
  Filled 2019-01-24: qty 4

## 2019-01-24 MED ORDER — NITROGLYCERIN IN D5W 200-5 MCG/ML-% IV SOLN
5.0000 ug/min | INTRAVENOUS | Status: DC
  Administered 2019-01-24 – 2019-01-25 (×2): 15 ug/min via INTRAVENOUS
  Filled 2019-01-24: qty 250

## 2019-01-24 MED ORDER — METOPROLOL TARTRATE 25 MG PO TABS
25.0000 mg | ORAL_TABLET | Freq: Two times a day (BID) | ORAL | Status: DC
  Administered 2019-01-24 – 2019-01-25 (×3): 25 mg via ORAL
  Filled 2019-01-24 (×3): qty 1

## 2019-01-24 MED ORDER — ASPIRIN EC 325 MG PO TBEC: 325.0000 mg | DELAYED_RELEASE_TABLET | Freq: Every day | ORAL | Status: DC

## 2019-01-24 MED ORDER — ONDANSETRON HCL 4 MG/2ML IJ SOLN: 4.0000 mg | Freq: Four times a day (QID) | INTRAMUSCULAR | Status: DC | PRN

## 2019-01-24 MED ORDER — ONDANSETRON HCL 4 MG PO TABS: 4.0000 mg | ORAL_TABLET | Freq: Four times a day (QID) | ORAL | Status: DC | PRN

## 2019-01-24 MED ORDER — FERROUS SULFATE 325 (65 FE) MG PO TABS
325.0000 mg | ORAL_TABLET | Freq: Two times a day (BID) | ORAL | Status: DC
  Administered 2019-01-24 – 2019-01-26 (×4): 325 mg via ORAL
  Filled 2019-01-24 (×4): qty 1

## 2019-01-24 MED ORDER — OXYCODONE HCL 5 MG PO TABS: 5.0000 mg | ORAL_TABLET | Freq: Four times a day (QID) | ORAL | Status: DC | PRN

## 2019-01-24 MED ORDER — ACETAMINOPHEN 650 MG RE SUPP: 650.0000 mg | Freq: Four times a day (QID) | RECTAL | Status: DC | PRN

## 2019-01-24 MED ORDER — ENOXAPARIN SODIUM 40 MG/0.4ML ~~LOC~~ SOLN
40.0000 mg | SUBCUTANEOUS | Status: DC
  Administered 2019-01-24 – 2019-01-25 (×2): 40 mg via SUBCUTANEOUS
  Filled 2019-01-24 (×2): qty 0.4

## 2019-01-24 MED ORDER — OXYCODONE HCL 5 MG PO TABS
5.0000 mg | ORAL_TABLET | Freq: Once | ORAL | Status: AC
  Administered 2019-01-24: 5 mg via ORAL
  Filled 2019-01-24: qty 1

## 2019-01-24 MED ORDER — VITAMIN D 25 MCG (1000 UNIT) PO TABS
2000.0000 [IU] | ORAL_TABLET | Freq: Every day | ORAL | Status: DC
  Administered 2019-01-25 – 2019-01-26 (×2): 2000 [IU] via ORAL
  Filled 2019-01-24 (×2): qty 2

## 2019-01-24 MED ORDER — ATORVASTATIN CALCIUM 20 MG PO TABS
20.0000 mg | ORAL_TABLET | Freq: Every evening | ORAL | Status: DC
  Administered 2019-01-24 – 2019-01-25 (×2): 20 mg via ORAL
  Filled 2019-01-24 (×2): qty 1

## 2019-01-24 MED ORDER — NITROGLYCERIN IN D5W 200-5 MCG/ML-% IV SOLN
5.0000 ug/min | INTRAVENOUS | Status: DC
  Administered 2019-01-24: 5 ug/min via INTRAVENOUS
  Filled 2019-01-24: qty 250

## 2019-01-24 MED ORDER — MORPHINE SULFATE (PF) 2 MG/ML IV SOLN
2.0000 mg | INTRAVENOUS | Status: DC | PRN
  Administered 2019-01-24 – 2019-01-26 (×3): 2 mg via INTRAVENOUS
  Filled 2019-01-24 (×3): qty 1

## 2019-01-24 MED ORDER — ACETAMINOPHEN 325 MG PO TABS
650.0000 mg | ORAL_TABLET | Freq: Four times a day (QID) | ORAL | Status: DC | PRN
  Administered 2019-01-24 – 2019-01-26 (×3): 650 mg via ORAL
  Filled 2019-01-24 (×3): qty 2

## 2019-01-24 MED ORDER — HYDRALAZINE HCL 25 MG PO TABS
50.0000 mg | ORAL_TABLET | Freq: Three times a day (TID) | ORAL | Status: DC
  Administered 2019-01-24: 50 mg via ORAL
  Filled 2019-01-24: qty 2

## 2019-01-24 MED ORDER — POTASSIUM CHLORIDE CRYS ER 20 MEQ PO TBCR
40.0000 meq | EXTENDED_RELEASE_TABLET | Freq: Once | ORAL | Status: AC
  Administered 2019-01-24: 18:00:00 40 meq via ORAL
  Filled 2019-01-24: qty 2

## 2019-01-24 NOTE — ED Triage Notes (Signed)
Pt states that he has been having chest pains for about a hour

## 2019-01-24 NOTE — Care Management Obs Status (Signed)
MEDICARE OBSERVATION STATUS NOTIFICATION   Patient Details  Name: Scott Ritter MRN: [id number] Date of Birth: 1960/12/05   Medicare Observation Status Notification Given:  Yes    Opal Dinning Sherryle Lis, LCSW 01/24/2019, 4:12 PM

## 2019-01-24 NOTE — ED Notes (Signed)
CRITICAL VALUE ALERT  Critical Value:  Troponin 0.04  Date & Time Notified: 01/23/19 1640  Provider Notified: bero  Orders Received/Actions taken: none

## 2019-01-24 NOTE — H&P (Signed)
History and Physical  Scott Ritter ZOX:[id number] DOB: June 08, 1961 DOA: 01/24/2019   PCP: Wilson Singer, MD   Patient coming from: Home  Chief Complaint: chest pain/sob  HPI:  Scott Ritter is a 58 y.o. male with medical history of stroke with left hemiparesis, hyperlipidemia, hypertension, GI bleed November 2019 presenting with onset of sharp left-sided chest pain that began at 1 PM on 01/24/2019.  The patient states that he was sitting in his chair watching television when it occurred.  He had some associated shortness of breath but denied any radiation of his chest pain, dizziness, syncope.  He described his chest pain as severe 9/10 sharp in nature that was worsened with movement as well as palpation.  There was also a pleuritic type component worsening with a deep breath.  The patient had denied any fevers, chills, coughing, nausea, vomiting, diarrhea, headache, neck pain, sore throat, dysuria, hematuria, hematochezia, melena. Notably, the patient states that 2 out of the 7 days of the week he would miss taking his antihypertensive medications. The patient had a recent admission to the hospital from 08/17/2018 through 08/19/2018 during which the patient had a GI bleed.  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  There was also a single nonbleeding erosion and a nonbleeding ulcer in the distal ileum.  This was due in part due to the patient's NSAID use in the past.  He was transfused 2 units PRBC during the hospitalization.  He denies any bleeding since that period of time. In the emergency department, the patient was afebrile with blood pressure of two 201/115.  Oxygen saturation was 99% on room air.  BMP showed a serum creatinine of 1.65 which is above his usual baseline.  Potassium was 3.1.  WBC was 1.0 hemoglobin 12.0 and platelets 252.  The patient was started on nitroglycerin drip and admitted for further  evaluation.  Cardiology was consulted to assist with management.  Assessment/Plan: Malignant hypertension/hypertensive emergency -Continue nitroglycerin drip -Titrate for systolic blood pressure less than 160 -holding losartan/HCTZ due to Acute on chronic renal failure -continue hydralazine -UDS  Chest Pain/Elevated Troponin -appears atypical by history -finish cycling troponins -Echo per cardiology -EKG without concerning ischemic changes -continue nitroglycerin gtt -start heparin IV if troponins trend up -continue ASA  Acute on Chronic renal Failure--CKD2 -baseline creatinine 1.2-1.3 -serum creatinine peaked 1.65 -due to hemodynamic changes in setting of losartan/hctz -d/c losartan HCTZ -renal US -UA bland  PSVT -metoprolol started by cardiology which should also help BP -optimize electrolytes -monitor on telemetry  Hyperlipidemia -restart statin -am lipid panel  Small Bowel AVMs/Erosive Gastritis -monitor Hgb and for signs of bleeding  Hypokalemia -replete -check mag  History of Stroke -residual left hemiparesis -PT eval -lipid panel -ASA          Past Medical History:  Diagnosis Date  . Hypertension   . Stroke [facility])    Past Surgical History:  Procedure Laterality Date  . BACK SURGERY    . BIOPSY  09/17/2018   Procedure: BIOPSY;  Surgeon: West Bali, MD;  [facility];  Service: Endoscopy;;  duodenal biopsy   . COLONOSCOPY N/A 02/29/2016   Dr. Darrick Penna: redundant colon, non-bleeding internal hemorrhoids   . ESOPHAGOGASTRODUODENOSCOPY N/A 12/17/2016   Dr. Darrick Penna: 3 mm nonbleeding Mallory-Weiss tear.  EGD performed for hematemesis.  Hemoglobin normal.  . ESOPHAGOGASTRODUODENOSCOPY N/A 02/19/2018   Dr. Jena Gauss: Performed  for melena, hemoglobin of 6.  Mucosal changes in the esophagus query short segment Barrett's, biopsy more consistent with reflux changes.  Erosive gastropathy but no H. pylori.  Duodenal erosions.  Suspected NSAID  induced injury.  . ESOPHAGOGASTRODUODENOSCOPY  08/18/2018   Dr. Karilyn Cota: IDA/heme positive stool.  Esophageal mucosal changes consistent with short segment Barrett's esophagus, not biopsied.  2 cm hiatal hernia.  Duodenal bulb, second portion of duodenum, third portion of the duodenum.  Video capsule somewhat difficult to pass through the oropharynx but was eventually advanced into the second portion of the duodenum and released.  . ESOPHAGOGASTRODUODENOSCOPY N/A 08/18/2018   Procedure: ESOPHAGOGASTRODUODENOSCOPY (EGD);  Surgeon: Malissa Hippo, MD;  [facility];  Service: Endoscopy;  Laterality: N/A;  . ESOPHAGOGASTRODUODENOSCOPY N/A 09/17/2018   Procedure: ENTEROSCOPY;  Surgeon: West Bali, MD;  [facility];  Service: Endoscopy;  Laterality: N/A;  . GIVENS CAPSULE STUDY N/A 08/18/2018   Procedure: GIVENS CAPSULE STUDY;  Surgeon: Malissa Hippo, MD;  [facility];  Service: Endoscopy;  Laterality: N/A;  . HERNIA REPAIR     Social History:  reports that he has never smoked. He has never used smokeless tobacco. He reports current alcohol use. He reports that he does not use drugs.   Family History  Problem Relation Age of Onset  . Hypertension Father   . Colon cancer Neg Hx   . Colon polyps Neg Hx      No Known Allergies   Prior to Admission medications   Medication Sig Start Date End Date Taking? Authorizing Provider  atorvastatin (LIPITOR) 20 MG tablet Take 20 mg by mouth every evening.  05/04/15  Yes [provider]  Cholecalciferol (VITAMIN D) 2000 units CAPS Take 2,000 Units by mouth daily.   Yes [provider]  ferrous sulfate 325 (65 FE) MG tablet Take 1 tablet (325 mg total) by mouth 2 (two) times daily with a meal. 12/01/18  Yes Tiffany Kocher, PA-C  hydrALAZINE (APRESOLINE) 50 MG tablet Take 1 tablet by mouth 3 (three) times daily. 01/21/19  Yes [provider]  losartan-hydrochlorothiazide (HYZAAR) 100-12.5 MG  tablet Take 1 tablet by mouth daily. 01/21/19  Yes [provider]  potassium chloride SA (K-DUR,KLOR-CON) 20 MEQ tablet Take 1 tablet (20 mEq total) by mouth daily. Patient taking differently: Take 20 mEq by mouth 2 (two) times daily.  12/17/16  Yes Erick Blinks, MD    Review of Systems:  Constitutional:  No weight loss, night sweats, Fevers, chills, fatigue.  Head&Eyes: No headache.  No vision loss.  No eye pain or scotoma ENT:  No Difficulty swallowing,Tooth/dental problems,Sore throat,  No ear ache, post nasal drip,  Cardio-vascular:  No Orthopnea, PND, swelling in lower extremities,  dizziness, palpitations  GI:  No  abdominal pain, nausea, vomiting, diarrhea, loss of appetite, hematochezia, melena, heartburn, indigestion, Resp:  No shortness of breath with exertion or at rest. No cough. No coughing up of blood .No wheezing.No chest wall deformity  Skin:  no rash or lesions.  GU:  no dysuria, change in color of urine, no urgency or frequency. No flank pain.  Musculoskeletal:  No joint pain or swelling. No decreased range of motion. No back pain.  Psych:  No change in mood or affect. No depression or anxiety. Neurologic: No headache, no dysesthesia, no focal weakness, no vision loss. No syncope  Physical Exam: Vitals:   01/24/19 1310 01/24/19 1350 01/24/19 1500 01/24/19 1522  BP: (!) 200/98  Marland Kitchen)  201/115 (!) 162/114  Pulse: 75 (!) 58 (!) 59 73  Resp: 14 16 (!) 22 17  Temp: 98.5 F (36.9 C)     TempSrc: Oral     SpO2: 99%     Weight:      Height:       General:  A&O x 3, NAD, nontoxic, pleasant/cooperative Head/Eye: No conjunctival hemorrhage, no icterus, Magnet/AT, No nystagmus ENT:  No icterus,  No thrush, good dentition, no pharyngeal exudate Neck:  No masses, no lymphadenpathy, no bruits CV:  RRR, no rub, no gallop, no S3 Lung:  Bibasilar crackles, L>R, no wheeze Abdomen: soft/NT, +BS, nondistended, no peritoneal signs Ext: No cyanosis, No rashes, No  petechiae, No lymphangitis, No edema Neuro: CNII-XII intact, strength 4/5 in RUE, RLE; 3+/5 LUE, LLE no dysmetria  Labs on Admission:  Basic Metabolic Panel: Recent Labs  Lab 01/24/19 1344  NA 138  K 3.1*  CL 104  CO2 25  GLUCOSE 111*  BUN 24*  CREATININE 1.65*  CALCIUM 8.9  MG 1.9   Liver Function Tests: No results for input(s): AST, ALT, ALKPHOS, BILITOT, PROT, ALBUMIN in the last 168 hours. No results for input(s): LIPASE, AMYLASE in the last 168 hours. No results for input(s): AMMONIA in the last 168 hours. CBC: Recent Labs  Lab 01/24/19 1344  WBC 11.0*  HGB 12.0*  HCT 37.5*  MCV 86.0  PLT 252   Coagulation Profile: No results for input(s): INR, PROTIME in the last 168 hours. Cardiac Enzymes: Recent Labs  Lab 01/24/19 1344  TROPONINI 0.04*   BNP: Invalid input(s): POCBNP CBG: No results for input(s): GLUCAP in the last 168 hours. Urine analysis:    Component Value Date/Time   COLORURINE STRAW (A) 02/18/2018 1700   APPEARANCEUR CLEAR 02/18/2018 1700   LABSPEC 1.012 02/18/2018 1700   PHURINE 7.0 02/18/2018 1700   GLUCOSEU NEGATIVE 02/18/2018 1700   HGBUR NEGATIVE 02/18/2018 1700   BILIRUBINUR NEGATIVE 02/18/2018 1700   KETONESUR NEGATIVE 02/18/2018 1700   PROTEINUR NEGATIVE 02/18/2018 1700   UROBILINOGEN 1.0 11/01/2008 1425   NITRITE NEGATIVE 02/18/2018 1700   LEUKOCYTESUR NEGATIVE 02/18/2018 1700   Sepsis Labs: @LABRCNTIP (procalcitonin:4,lacticidven:4) )No results found for this or any previous visit (from the past 240 hour(s)).   Radiological Exams on Admission: Dg Chest Port 1 View  Result Date: 01/24/2019 CLINICAL DATA:  58 year old male with a history of chest pain with shortness of breath and weakness EXAM: PORTABLE CHEST 1 VIEW COMPARISON:  02/18/2018, 12/16/2016 FINDINGS: Cardiomediastinal silhouette unchanged. No evidence of interlobular septal thickening or central vascular congestion. No pneumothorax. No pleural effusion. No confluent  airspace disease. Similar appearance of coarsened interstitial markings. Degenerative changes of the shoulders. IMPRESSION: Negative for acute cardiopulmonary disease Electronically Signed   By: Gilmer MorJaime  Wagner D.O.   On: 01/24/2019 14:02    EKG: Independently reviewed. Sinus, nonspecific T wave changes    Time spent:60 minutes Code Status:   FULL Family Communication:  No Family at bedside Disposition Plan: expect 1-2 day hospitalization Consults called: cardiology DVT Prophylaxis: [place] Lovenox  Catarina HartshornDavid Bruce Mayers, DO  [facility] Pager [phone]  If 7PM-7AM, please contact night-coverage [email or web address] Password [email or web address] 01/24/2019, 4:09 PM

## 2019-01-24 NOTE — Consult Note (Signed)
Cardiology Consultation:   Patient ID: Scott Ritter MRN: [id number]; DOB: [birth year]  Admit date: 01/24/2019 Date of Consult: 01/24/2019  Primary Care Provider: Wilson Singer, MD Primary Cardiologist: New Primary Electrophysiologist:  None    Patient Profile:   Scott Ritter is a 58 y.o. male with a hx of HTN who is being seen today for the evaluation of chest pain at the request of Dr . Kennis Carina  History of Present Illness:   Scott Ritter history of mallory weiss tear, HTN, prior CVA, GI bleedin admitted with chest pain.   History of GI bleed 08/2018 thought secondary to NSAIDs, history of erosive gastritis. Transfused 2 units pRBCs during that admission, no clear source of bleeding by endoscopy. Repeat endscopy as outpatient showed 2 nonbleeding angioectasias in the duodenum that were treated.    Presents with chest pain. Symptoms started at 1230pm while at rest. Sharp 9/10 pain left sided, worst with deep breathing and position change. Constant since 1230 (3 hrs now) though variable in severity.     ER vitials 200/90 p 75 99% RA WBC 11 Hgb 12 Plt 252 K 3.1 Cr 1.65 Trop 0.04--> D-dimer negative CXR no acute process EKG SR, PACs, isolated ST depression V6  In ER received ASA 324, oxycodone.  Past Medical History:  Diagnosis Date  . Hypertension   . Stroke [facility])     Past Surgical History:  Procedure Laterality Date  . BACK SURGERY    . BIOPSY  09/17/2018   Procedure: BIOPSY;  Surgeon: West Bali, MD;  [facility];  Service: Endoscopy;;  duodenal biopsy   . COLONOSCOPY N/A 02/29/2016   Dr. Darrick Penna: redundant colon, non-bleeding internal hemorrhoids   . ESOPHAGOGASTRODUODENOSCOPY N/A 12/17/2016   Dr. Darrick Penna: 3 mm nonbleeding Mallory-Weiss tear.  EGD performed for hematemesis.  Hemoglobin normal.  . ESOPHAGOGASTRODUODENOSCOPY N/A 02/19/2018   Dr. Jena Gauss: Performed for melena, hemoglobin of 6.  Mucosal changes in the esophagus query short  segment Barrett's, biopsy more consistent with reflux changes.  Erosive gastropathy but no H. pylori.  Duodenal erosions.  Suspected NSAID induced injury.  . ESOPHAGOGASTRODUODENOSCOPY  08/18/2018   Dr. Karilyn Cota: IDA/heme positive stool.  Esophageal mucosal changes consistent with short segment Barrett's esophagus, not biopsied.  2 cm hiatal hernia.  Duodenal bulb, second portion of duodenum, third portion of the duodenum.  Video capsule somewhat difficult to pass through the oropharynx but was eventually advanced into the second portion of the duodenum and released.  . ESOPHAGOGASTRODUODENOSCOPY N/A 08/18/2018   Procedure: ESOPHAGOGASTRODUODENOSCOPY (EGD);  Surgeon: Malissa Hippo, MD;  [facility];  Service: Endoscopy;  Laterality: N/A;  . ESOPHAGOGASTRODUODENOSCOPY N/A 09/17/2018   Procedure: ENTEROSCOPY;  Surgeon: West Bali, MD;  [facility];  Service: Endoscopy;  Laterality: N/A;  . GIVENS CAPSULE STUDY N/A 08/18/2018   Procedure: GIVENS CAPSULE STUDY;  Surgeon: Malissa Hippo, MD;  [facility];  Service: Endoscopy;  Laterality: N/A;  . HERNIA REPAIR       Inpatient Medications: Scheduled Meds:  Continuous Infusions:  PRN Meds:   Allergies:   No Known Allergies  Social History:   Social History   Socioeconomic History  . Marital status: Married    Spouse name: Not on file  . Number of children: Not on file  . Years of education: Not on file  . Highest education level: Not on file  Occupational History  . Not on file  Social Needs  . Financial  resource strain: Not on file  . Food insecurity:    Worry: Not on file    Inability: Not on file  . Transportation needs:    Medical: Not on file    Non-medical: Not on file  Tobacco Use  . Smoking status: Never Smoker  . Smokeless tobacco: Never Used  Substance and Sexual Activity  . Alcohol use: Yes    Comment: occasionally  . Drug use: No  . Sexual activity: Not on file  Lifestyle   . Physical activity:    Days per week: Not on file    Minutes per session: Not on file  . Stress: Not on file  Relationships  . Social connections:    Talks on phone: Not on file    Gets together: Not on file    Attends religious service: Not on file    Active member of club or organization: Not on file    Attends meetings of clubs or organizations: Not on file    Relationship status: Not on file  . Intimate partner violence:    Fear of current or ex partner: Not on file    Emotionally abused: Not on file    Physically abused: Not on file    Forced sexual activity: Not on file  Other Topics Concern  . Not on file  Social History Narrative  . Not on file    Family History:    Family History  Problem Relation Age of Onset  . Hypertension Father   . Colon cancer Neg Hx   . Colon polyps Neg Hx      ROS:  Please see the history of present illness.  All other ROS reviewed and negative.     Physical Exam/Data:   Vitals:   01/24/19 1309 01/24/19 1310 01/24/19 1350  BP:  (!) 200/98   Pulse:  75 (!) 58  Resp:  14 16  Temp:  98.5 F (36.9 C)   TempSrc:  Oral   SpO2:  99%   Weight: 94.3 kg    Height: 5\' 7"  (1.702 m)     No intake or output data in the 24 hours ending 01/24/19 1447 Last 3 Weights 01/24/2019 09/17/2018 08/17/2018  Weight (lbs) 208 lb 212 lb 207 lb 3.7 oz  Weight (kg) 94.348 kg 96.163 kg 94 kg     Body mass index is 32.58 kg/m.  General:  Well nourished, well developed, in no acute distress HEENT: normal Lymph: no adenopathy Neck: no JVD Endocrine:  No thryomegaly Vascular: No carotid bruits; FA pulses 2+ bilaterally without bruits  Lungs:  clear to auscultation bilaterally, no wheezing, rhonchi or rales  Abd: soft, nontender, no hepatomegaly  Ext: no edema Musculoskeletal:  No deformities, BUE and BLE strength normal and equal. Chest wall left sided tender to palpation Skin: warm and dry  Neuro:  CNs 2-12 intact, no focal abnormalities noted Psych:   Normal affect     Laboratory Data:  Chemistry Recent Labs  Lab 01/24/19 1344  NA 138  K 3.1*  CL 104  CO2 25  GLUCOSE 111*  BUN 24*  CREATININE 1.65*  CALCIUM 8.9  GFRNONAA 46*  GFRAA 53*  ANIONGAP 9    No results for input(s): PROT, ALBUMIN, AST, ALT, ALKPHOS, BILITOT in the last 168 hours. Hematology Recent Labs  Lab 01/24/19 1344  WBC 11.0*  RBC 4.36  HGB 12.0*  HCT 37.5*  MCV 86.0  MCH 27.5  MCHC 32.0  RDW 16.0*  PLT 252  Cardiac Enzymes Recent Labs  Lab 01/24/19 1344  TROPONINI 0.04*   No results for input(s): TROPIPOC in the last 168 hours.  BNPNo results for input(s): BNP, PROBNP in the last 168 hours.  DDimer  Recent Labs  Lab 01/24/19 1344  DDIMER <0.27    Radiology/Studies:  Dg Chest Port 1 View  Result Date: 01/24/2019 CLINICAL DATA:  58 year old male with a history of chest pain with shortness of breath and weakness EXAM: PORTABLE CHEST 1 VIEW COMPARISON:  02/18/2018, 12/16/2016 FINDINGS: Cardiomediastinal silhouette unchanged. No evidence of interlobular septal thickening or central vascular congestion. No pneumothorax. No pleural effusion. No confluent airspace disease. Similar appearance of coarsened interstitial markings. Degenerative changes of the shoulders. IMPRESSION: Negative for acute cardiopulmonary disease Electronically Signed   By: Gilmer MorJaime  Wagner D.O.   On: 01/24/2019 14:02    Assessment and Plan:   1. Chest pain - pain itself is not cardiac in description. Constant x 3 hours, worst with movement, breathing, and palpation.  - nonspecific barely detectable trop in setting of severe HTN and AKI is nonspecific, no clear ischemic EKG changes - unclear if HTN emergency/urgency or possibly ACS, or simply MSK pain with a nonspecific trop - follow symptoms with bp control, follow trops and EKGs - obtain echo tomorrow AM. Check UDS with chest pain and severe HTN - pending further workup will dictate what direction we go in, as of now  would admit to stepdown unit on NG drip with gradual correction of bp - if significant trop elevation above 0.2 would start hep gtt.    2. AKI - per primary team  3. PSVT - short runs of PSVT in ER, frequency PACs - K is 3.1, will give 40mEq oral. Check Mg.  - start low dose beta blocker. If ectopy resolves with normalization of electrolytes will not have to stay on long term   For questions or updates, please contact [facility] Please consult [email or web address] for contact info under     Signed, Dina RichBranch, Jilliann Subramanian, MD  01/24/2019 2:47 PM

## 2019-01-24 NOTE — ED Provider Notes (Signed)
[facility] Emergency Department Provider Note MRN:  [id number]  Arrival date & time: 01/24/19     Chief Complaint   Chest Pain   History of Present Illness   Scott Ritter is a 58 y.o. year-old male with a history of hypertension, stroke presenting to the ED with chief complaint of chest pain.  Pain began 1 hour ago, located in the left side of the chest, described as a sharp pain, worse with motion, worse with deep breathing.  Associated with shortness of breath.  Symptoms are constant.  Pain is currently 8 out of 10.  Denies dizziness, no lightheadedness, no diaphoresis, no nausea, no vomiting, no abdominal pain, no new numbness weakness to the arms or legs.  Patient has residual left-sided deficits from a prior stroke.  Review of Systems  A complete 10 system review of systems was obtained and all systems are negative except as noted in the HPI and PMH.   Patient's Health History    Past Medical History:  Diagnosis Date  . Hypertension   . Stroke [facility])     Past Surgical History:  Procedure Laterality Date  . BACK SURGERY    . BIOPSY  09/17/2018   Procedure: BIOPSY;  Surgeon: West Bali, MD;  Location: [facility] END[facility] SU[facility];  Service: Endoscopy;;  duodenal biopsy   . COLONOSCOPY N/A 02/29/2016   Dr. Darrick Penna: redundant colon, non-bleeding internal hemorrhoids   . ESOPHAGOGASTRODUODENOSCOPY N/A 12/17/2016   Dr. Darrick Penna: 3 mm nonbleeding Mallory-Weiss tear.  EGD performed for hematemesis.  Hemoglobin normal.  . ESOPHAGOGASTRODUODENOSCOPY N/A 02/19/2018   Dr. Jena Gauss: Performed for melena, hemoglobin of 6.  Mucosal changes in the esophagus query short segment Barrett's, biopsy more consistent with reflux changes.  Erosive gastropathy but no H. pylori.  Duodenal erosions.  Suspected NSAID induced injury.  . ESOPHAGOGASTRODUODENOSCOPY  08/18/2018   Dr. Karilyn Cota: IDA/heme positive stool.  Esophageal mucosal changes consistent with short segment Barrett's esophagus, not  biopsied.  2 cm hiatal hernia.  Duodenal bulb, second portion of duodenum, third portion of the duodenum.  Video capsule somewhat difficult to pass through the oropharynx but was eventually advanced into the second portion of the duodenum and released.  . ESOPHAGOGASTRODUODENOSCOPY N/A 08/18/2018   Procedure: ESOPHAGOGASTRODUODENOSCOPY (EGD);  Surgeon: Malissa Hippo, MD;  Location[facility];  Service: Endoscopy;  Laterality: N/A;  . ESOPHAGOGASTRODUODENOSCOPY N/A 09/17/2018   Procedure: ENTEROSCOPY;  Surgeon: West Bali, MD;  Location: [facility];  Service: Endoscopy;  Laterality: N/A;  . GIVENS CAPSULE STUDY N/A 08/18/2018   Procedure: GIVENS CAPSULE STUDY;  Surgeon: Malissa Hippo, MD;  Location: [facility];  Service: Endoscopy;  Laterality: N/A;  . HERNIA REPAIR      Family History  Problem Relation Age of Onset  . Hypertension Father   . Colon cancer Neg Hx   . Colon polyps Neg Hx     Social History   Socioeconomic History  . Marital status: Married    Spouse name: Not on file  . Number of children: Not on file  . Years of education: Not on file  . Highest education level: Not on file  Occupational History  . Not on file  Social Needs  . Financial resource strain: Not on file  . Food insecurity:    Worry: Not on file    Inability: Not on file  . Transportation needs:    Medical: Not on file    Non-medical: Not on file  Tobacco  Use  . Smoking status: Never Smoker  . Smokeless tobacco: Never Used  Substance and Sexual Activity  . Alcohol use: Yes    Comment: occasionally  . Drug use: No  . Sexual activity: Not on file  Lifestyle  . Physical activity:    Days per week: Not on file    Minutes per session: Not on file  . Stress: Not on file  Relationships  . Social connections:    Talks on phone: Not on file    Gets together: Not on file    Attends religious service: Not on file    Active member of club or organization: Not on file    Attends  meetings of clubs or organizations: Not on file    Relationship status: Not on file  . Intimate partner violence:    Fear of current or ex partner: Not on file    Emotionally abused: Not on file    Physically abused: Not on file    Forced sexual activity: Not on file  Other Topics Concern  . Not on file  Social History Narrative  . Not on file     Physical Exam  Vital Signs and Nursing Notes reviewed Vitals:   01/24/19 1350 01/24/19 1500  BP:  (!) 201/115  Pulse: (!) 58 (!) 59  Resp: 16 (!) 22  Temp:    SpO2:      CONSTITUTIONAL: Well-appearing, NAD NEURO:  Alert and oriented x 3, mild left arm and left leg weakness EYES:  eyes equal and reactive ENT/NECK:  no LAD, no JVD CARDIO: Regular rate, well-perfused, normal S1 and S2 PULM:  CTAB no wheezing or rhonchi GI/GU:  normal bowel sounds, non-distended, non-tender MSK/SPINE:  No gross deformities, no edema SKIN:  no rash, atraumatic PSYCH:  Appropriate speech and behavior  Diagnostic and Interventional Summary    EKG Interpretation  Date/Time:  Monday January 24 2019 13:20:05 EDT Ventricular Rate:  74 PR Interval:    QRS Duration: 104 QT Interval:  418 QTC Calculation: 464 R Axis:   70 Text Interpretation:  Sinus rhythm Atrial premature complex Probable left atrial enlargement Borderline T wave abnormalities Confirmed by Kennis Carina [phone]) on 01/24/2019 1:46:01 PM      Labs Reviewed  CBC - Abnormal; Notable for the following components:      Result Value   WBC 11.0 (*)    Hemoglobin 12.0 (*)    HCT 37.5 (*)    RDW 16.0 (*)    All other components within normal limits  BASIC METABOLIC PANEL - Abnormal; Notable for the following components:   Potassium 3.1 (*)    Glucose, Bld 111 (*)    BUN 24 (*)    Creatinine, Ser 1.65 (*)    GFR calc non Af Amer 46 (*)    GFR calc Af Amer 53 (*)    All other components within normal limits  TROPONIN I - Abnormal; Notable for the following components:   Troponin I 0.04  (*)    All other components within normal limits  D-DIMER, QUANTITATIVE (NOT AT [facility])  RAPID URINE DRUG SCREEN, HOSP PERFORMED    DG Chest Port 1 View  Final Result      Medications  nitroGLYCERIN 50 mg in dextrose 5 % 250 mL (0.2 mg/mL) infusion (has no administration in time range)  aspirin chewable tablet 324 mg (324 mg Oral Given 01/24/19 1406)  oxyCODONE (Oxy IR/ROXICODONE) immediate release tablet 5 mg (5 mg Oral Given 01/24/19 1406)  Procedures Critical Care Critical Care Documentation Critical care time provided by me (excluding procedures): 35 minutes  Condition necessitating critical care: concern for ACS or hypertensive emergency  Components of critical care management: reviewing of prior records, laboratory and imaging interpretation, frequent re-examination and reassessment of vital signs, administration of IV nitroglycerine, discussion with consulting services.    ED Course and Medical Decision Making  I have reviewed the triage vital signs and the nursing notes.  Pertinent labs & imaging results that were available during my care of the patient were reviewed by me and considered in my medical decision making (see below for details).  Considering ACS given past medical history, however atypical presentation given the sharp nature without significant associated symptoms.  Also considering MSK versus PE, considered low risk for PE, will screen with d-dimer.  Patient explains that he has had muscle cramps in the chest years ago and it feels similar.  Labs reveal normal d-dimer, troponin elevated 0.04.  Discussed case with Dr. Wyline MoodBranch with cardiology, who is favoring hypertensive emergency rather than ACS given patient's blood pressure.  Re[facility]ending remaining at [facility], stepdown unit, nitro drip.  Awaiting formal acceptance by hospitalist.  Elmer SowMichael M. Pilar PlateBero, MD [facility] [email or web address]  Final Clinical  Impressions(s) / ED Diagnoses     ICD-10-CM   1. Hypertensive emergency I16.1   2. Chest pain R07.9 [facility]    [facility]    ED Discharge Orders    None         Sabas SousBero, Yaffa Seckman M, MD 01/24/19 [phone]

## 2019-01-24 NOTE — Progress Notes (Signed)
Pt c/o 9/10 CP that keeps getting worse, Nitro gtt has been increased to from 30 at beginning of shift, Tylenol was given earlier this shift as well. Donnamarie Poag paged to make aware. Waiting for call back/orders. Will continue to monitor pt

## 2019-01-25 ENCOUNTER — Observation Stay (HOSPITAL_BASED_OUTPATIENT_CLINIC_OR_DEPARTMENT_OTHER): Payer: Medicare HMO

## 2019-01-25 ENCOUNTER — Ambulatory Visit: Payer: Medicare HMO | Admitting: Nurse Practitioner

## 2019-01-25 DIAGNOSIS — I471 Supraventricular tachycardia: Secondary | ICD-10-CM | POA: Diagnosis present

## 2019-01-25 DIAGNOSIS — R079 Chest pain, unspecified: Secondary | ICD-10-CM | POA: Diagnosis not present

## 2019-01-25 DIAGNOSIS — I371 Nonrheumatic pulmonary valve insufficiency: Secondary | ICD-10-CM | POA: Diagnosis not present

## 2019-01-25 DIAGNOSIS — I69354 Hemiplegia and hemiparesis following cerebral infarction affecting left non-dominant side: Secondary | ICD-10-CM | POA: Diagnosis not present

## 2019-01-25 DIAGNOSIS — Z79899 Other long term (current) drug therapy: Secondary | ICD-10-CM | POA: Diagnosis not present

## 2019-01-25 DIAGNOSIS — E876 Hypokalemia: Secondary | ICD-10-CM | POA: Diagnosis present

## 2019-01-25 DIAGNOSIS — K5520 Angiodysplasia of colon without hemorrhage: Secondary | ICD-10-CM | POA: Diagnosis present

## 2019-01-25 DIAGNOSIS — E785 Hyperlipidemia, unspecified: Secondary | ICD-10-CM | POA: Diagnosis present

## 2019-01-25 DIAGNOSIS — I161 Hypertensive emergency: Secondary | ICD-10-CM | POA: Diagnosis present

## 2019-01-25 DIAGNOSIS — Z8719 Personal history of other diseases of the digestive system: Secondary | ICD-10-CM | POA: Diagnosis not present

## 2019-01-25 DIAGNOSIS — N179 Acute kidney failure, unspecified: Secondary | ICD-10-CM | POA: Diagnosis present

## 2019-01-25 DIAGNOSIS — Z8249 Family history of ischemic heart disease and other diseases of the circulatory system: Secondary | ICD-10-CM | POA: Diagnosis not present

## 2019-01-25 DIAGNOSIS — I129 Hypertensive chronic kidney disease with stage 1 through stage 4 chronic kidney disease, or unspecified chronic kidney disease: Secondary | ICD-10-CM | POA: Diagnosis present

## 2019-01-25 DIAGNOSIS — R7989 Other specified abnormal findings of blood chemistry: Secondary | ICD-10-CM | POA: Diagnosis present

## 2019-01-25 DIAGNOSIS — K2960 Other gastritis without bleeding: Secondary | ICD-10-CM | POA: Diagnosis present

## 2019-01-25 DIAGNOSIS — D500 Iron deficiency anemia secondary to blood loss (chronic): Secondary | ICD-10-CM | POA: Diagnosis present

## 2019-01-25 DIAGNOSIS — R0789 Other chest pain: Secondary | ICD-10-CM | POA: Diagnosis present

## 2019-01-25 DIAGNOSIS — N182 Chronic kidney disease, stage 2 (mild): Secondary | ICD-10-CM | POA: Diagnosis present

## 2019-01-25 DIAGNOSIS — E782 Mixed hyperlipidemia: Secondary | ICD-10-CM | POA: Diagnosis not present

## 2019-01-25 LAB — BASIC METABOLIC PANEL
Anion gap: 9 (ref 5–15)
BUN: 24 mg/dL — ABNORMAL HIGH (ref 6–20)
CO2: 25 mmol/L (ref 22–32)
Calcium: 8.5 mg/dL — ABNORMAL LOW (ref 8.9–10.3)
Chloride: 103 mmol/L (ref 98–111)
Creatinine, Ser: 1.47 mg/dL — ABNORMAL HIGH (ref 0.61–1.24)
GFR calc Af Amer: >60 mL/min (ref >60)
GFR calc non Af Amer: 53 mL/min — ABNORMAL LOW (ref >60)
Glucose, Bld: 102 mg/dL — ABNORMAL HIGH (ref 70–99)
Potassium: 3.3 mmol/L — ABNORMAL LOW (ref 3.5–5.1)
Sodium: 137 mmol/L (ref 135–145)

## 2019-01-25 LAB — MAGNESIUM: Magnesium: 1.8 mg/dL (ref 1.7–2.4)

## 2019-01-25 LAB — TROPONIN I: Troponin I: 0.04 ng/mL (ref <0.03)

## 2019-01-25 LAB — CBC
HCT: 35.1 % — ABNORMAL LOW (ref 39.0–52.0)
Hemoglobin: 11.3 g/dL — ABNORMAL LOW (ref 13.0–17.0)
MCH: 28 pg (ref 26.0–34.0)
MCHC: 32.2 g/dL (ref 30.0–36.0)
MCV: 87.1 fL (ref 80.0–100.0)
Platelets: 249 10*3/uL (ref 150–400)
RBC: 4.03 MIL/uL — ABNORMAL LOW (ref 4.22–5.81)
RDW: 16.1 % — ABNORMAL HIGH (ref 11.5–15.5)
WBC: 12.7 10*3/uL — ABNORMAL HIGH (ref 4.0–10.5)
nRBC: 0 % (ref 0.0–0.2)

## 2019-01-25 LAB — LIPID PANEL
Cholesterol: 156 mg/dL (ref 0–200)
HDL: 31 mg/dL — ABNORMAL LOW (ref >40)
LDL Cholesterol: 96 mg/dL (ref 0–99)
Total CHOL/HDL Ratio: 5 RATIO
Triglycerides: 146 mg/dL (ref <150)
VLDL: 29 mg/dL (ref 0–40)

## 2019-01-25 LAB — ECHOCARDIOGRAM COMPLETE
Height: 67 in
Weight: 3396.85 oz

## 2019-01-25 MED ORDER — METHOCARBAMOL 500 MG PO TABS
500.0000 mg | ORAL_TABLET | Freq: Three times a day (TID) | ORAL | Status: DC
  Administered 2019-01-25 – 2019-01-26 (×3): 500 mg via ORAL
  Filled 2019-01-25 (×3): qty 1

## 2019-01-25 MED ORDER — HYDRALAZINE HCL 25 MG PO TABS
100.0000 mg | ORAL_TABLET | Freq: Three times a day (TID) | ORAL | Status: DC
  Administered 2019-01-25 (×2): 100 mg via ORAL
  Filled 2019-01-25 (×2): qty 4

## 2019-01-25 MED ORDER — AMLODIPINE BESYLATE 5 MG PO TABS
5.0000 mg | ORAL_TABLET | Freq: Every day | ORAL | Status: DC
  Administered 2019-01-25: 09:00:00 5 mg via ORAL
  Filled 2019-01-25: qty 1

## 2019-01-25 MED ORDER — HYDRALAZINE HCL 25 MG PO TABS
75.0000 mg | ORAL_TABLET | Freq: Three times a day (TID) | ORAL | Status: DC
  Administered 2019-01-25: 09:00:00 75 mg via ORAL
  Filled 2019-01-25: qty 3

## 2019-01-25 MED ORDER — ASPIRIN EC 81 MG PO TBEC
81.0000 mg | DELAYED_RELEASE_TABLET | Freq: Every day | ORAL | Status: DC
  Administered 2019-01-25 – 2019-01-26 (×2): 81 mg via ORAL
  Filled 2019-01-25 (×2): qty 1

## 2019-01-25 MED ORDER — POTASSIUM CHLORIDE CRYS ER 20 MEQ PO TBCR
40.0000 meq | EXTENDED_RELEASE_TABLET | Freq: Two times a day (BID) | ORAL | Status: AC
  Administered 2019-01-25 (×2): 40 meq via ORAL
  Filled 2019-01-25 (×2): qty 2

## 2019-01-25 NOTE — Progress Notes (Signed)
*  PRELIMINARY RESULTS* Echocardiogram 2D Echocardiogram has been performed.  Stacey Drain 01/25/2019, 10:04 AM

## 2019-01-25 NOTE — Progress Notes (Signed)
PROGRESS NOTE  Scott Ritter N Scott Ritter:[id number] DOB: 18-Jun-1961 DOA: 01/24/2019 PCP: Wilson SingerGosrani, Nimish C, MD  Brief History:  58 y.o. male with medical history of stroke with left hemiparesis, hyperlipidemia, hypertension, GI bleed November 2019 presenting with onset of sharp left-sided chest pain that began at 1 PM on 01/24/2019.  The patient states that he was sitting in his chair watching television when it occurred.  He had some associated shortness of breath but denied any radiation of his chest pain, dizziness, syncope.  He described his chest pain as severe 9/10 sharp in nature that was worsened with movement as well as palpation.  There was also a pleuritic type component worsening with a deep breath.  The patient had denied any fevers, chills, coughing, nausea, vomiting, diarrhea, headache, neck pain, sore throat, dysuria, hematuria, hematochezia, melena. Notably, the patient states that 2 out of the 7 days of the week he would miss taking his antihypertensive medications. The patient had a recent admission to the hospital from 08/17/2018 through 08/19/2018 during which the patient had a GI bleed.  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  There was also a single nonbleeding erosion and a nonbleeding ulcer in the distal ileum.  This was due in part due to the patient's NSAID use in the past.  He was transfused 2 units PRBC during the hospitalization.  He denies any bleeding since that period of time.  In the emergency department, the patient was afebrile with blood pressure of two 201/115.  Oxygen saturation was 99% on room air.  BMP showed a serum creatinine of 1.65 which is above his usual baseline.  Potassium was 3.1.  WBC was 1.0 hemoglobin 12.0 and platelets 252.  The patient was started on nitroglycerin drip and admitted for further evaluation.  Cardiology was consulted to assist with management.   Assessment/Plan:  Malignant hypertension -Continue nitroglycerin drip-->wean to off -Titrate for systolic blood pressure less than 160 -holding losartan/HCTZ due to Acute on chronic renal failure -continue hydralazine--increase to 75 mg tid -added amlodipine and metoprolol -UDS--neg  Chest Pain/Elevated Troponin -appears atypical by history -most consistent with musculoskeletal -trend is flat -Echo--EF 55-60%, no WMA; impaired relaxation -personally reviewed EKG--sinus, TWI V4-V6 -continue nitroglycerin gtt -start heparin IV if troponins trend up -continue ASA, BB -start robaxin -continue oxycodone -personally reviewed CXR--no edema or infiltrate  Acute on Chronic renal Failure--CKD2 -baseline creatinine 1.2-1.3 -serum creatinine peaked 1.65 -due to hemodynamic changes in setting of losartan/hctz -d/c losartan HCTZ -UA bland -am BMP  PSVT -metoprolol started by cardiology which should also help BP -optimize electrolytes -monitor on telemetry  Hyperlipidemia -continue statin -LDL 96  Small Bowel AVMs/Erosive Gastritis -monitor Hgb and for signs of bleeding  Hypokalemia -replete -check mag--1.8  History of Stroke -residual left hemiparesis -PT eval -lipid panel -ASA      Disposition Plan:   Home 4/15 if stable and cleared by cardio Family Communication:  No Family at bedside  Consultants:  cardiology  Code Status:  FULL   DVT Prophylaxis:  [place] Lovenox   Procedures: As Listed in Progress Note Above  Antibiotics: None       Subjective: Pt continues to complain of sharp left sided cp, worse with movement, deep breath, palpation.  Denies f/c, n/v/d, abd pain.  C/o headach with ntg  Objective: Vitals:   01/25/19 0921 01/25/19 1000 01/25/19 1045 01/25/19 1208  BP: (!) 172/113 (!) 163/93 (!) 150/80   Pulse:  69 65   Resp:  20 15   Temp:    99 F (37.2 C)  TempSrc:    Oral  SpO2:  95% 96%   Weight:      Height:         Intake/Output Summary (Last 24 hours) at 01/25/2019 1217 Last data filed at 01/25/2019 1124 Gross per 24 hour  Intake 1103 ml  Output 1150 ml  Net -47 ml   Weight change:  Exam:   General:  Pt is alert, follows commands appropriately, not in acute distress  HEENT: No icterus, No thrush, No neck mass, [place]/AT  Cardiovascular: RRR, S1/S2, no rubs, no gallops; left chest wall without erythema or lesions.  Tender to palpation without edema or induration  Respiratory: CTA bilaterally, no wheezing, no crackles, no rhonchi  Abdomen: Soft/+BS, non tender, non distended, no guarding  Extremities: No edema, No lymphangitis, No petechiae, No rashes, no synovitis   Data Reviewed: I have personally reviewed following labs and imaging studies Basic Metabolic Panel: Recent Labs  Lab 01/24/19 1344 01/25/19 0359  NA 138 137  K 3.1* 3.3*  CL 104 103  CO2 25 25  GLUCOSE 111* 102*  BUN 24* 24*  CREATININE 1.65* 1.47*  CALCIUM 8.9 8.5*  MG 1.9 1.8   Liver Function Tests: No results for input(s): AST, ALT, ALKPHOS, BILITOT, PROT, ALBUMIN in the last 168 hours. No results for input(s): LIPASE, AMYLASE in the last 168 hours. No results for input(s): AMMONIA in the last 168 hours. Coagulation Profile: No results for input(s): INR, PROTIME in the last 168 hours. CBC: Recent Labs  Lab 01/24/19 1344 01/25/19 0359  WBC 11.0* 12.7*  HGB 12.0* 11.3*  HCT 37.5* 35.1*  MCV 86.0 87.1  PLT 252 249   Cardiac Enzymes: Recent Labs  Lab 01/24/19 1344 01/24/19 1714 01/24/19 2309  TROPONINI 0.04* 0.04* 0.04*   BNP: Invalid input(s): POCBNP CBG: No results for input(s): GLUCAP in the last 168 hours. HbA1C: No results for input(s): HGBA1C in the last 72 hours. Urine analysis:    Component Value Date/Time   COLORURINE YELLOW 01/24/2019 1706   APPEARANCEUR CLEAR 01/24/2019 1706   LABSPEC 1.017 01/24/2019 1706   PHURINE 6.0 01/24/2019 1706   GLUCOSEU NEGATIVE 01/24/2019 1706   HGBUR  NEGATIVE 01/24/2019 1706   BILIRUBINUR NEGATIVE 01/24/2019 1706   KETONESUR NEGATIVE 01/24/2019 1706   PROTEINUR NEGATIVE 01/24/2019 1706   UROBILINOGEN 1.0 11/01/2008 1425   NITRITE NEGATIVE 01/24/2019 1706   LEUKOCYTESUR NEGATIVE 01/24/2019 1706   Sepsis Labs: @LABRCNTIP (procalcitonin:4,lacticidven:4) ) Recent Results (from the past 240 hour(s))  MRSA PCR Screening     Status: None   Collection Time: 01/24/19  3:56 PM  Result Value Ref Range Status   MRSA by PCR NEGATIVE NEGATIVE Final    Comment:        The GeneXpert MRSA Assay (FDA approved for NASAL specimens only), is one component of a comprehensive MRSA colonization surveillance program. It is not intended to diagnose MRSA infection nor to guide or monitor treatment for MRSA infections. Performed at [facility], [address], [place], [place] [address]      Scheduled Meds: . amLODipine  5 mg Oral Daily  . aspirin EC  81 mg Oral Daily  . atorvastatin  20 mg Oral QPM  . Chlorhexidine Gluconate Cloth  6 each Topical Daily  . cholecalciferol  2,000 Units Oral Daily  . enoxaparin (LOVENOX) injection  40 mg Subcutaneous Q24H  . ferrous sulfate  325 mg Oral BID WC  . hydrALAZINE  75 mg Oral TID  . metoprolol tartrate  25 mg Oral BID  . potassium chloride  40 mEq Oral BID   Continuous Infusions: . nitroGLYCERIN 20 mcg/min (01/25/19 1123)    Procedures/Studies: Dg Chest Port 1 View  Result Date: 01/24/2019 CLINICAL DATA:  58 year old male with a history of chest pain with shortness of breath and weakness EXAM: PORTABLE CHEST 1 VIEW COMPARISON:  02/18/2018, 12/16/2016 FINDINGS: Cardiomediastinal silhouette unchanged. No evidence of interlobular septal thickening or central vascular congestion. No pneumothorax. No pleural effusion. No confluent airspace disease. Similar appearance of coarsened interstitial markings. Degenerative changes of the shoulders. IMPRESSION: Negative for acute cardiopulmonary disease  Electronically Signed   By: Gilmer Mor D.O.   On: 01/24/2019 14:02    Catarina Hartshorn, DO  [facility] Pager [phone]  If 7PM-7AM, please contact night-coverage [email or web address] Password [email or web address] 01/25/2019, 12:17 PM   LOS: 0 days

## 2019-01-25 NOTE — Progress Notes (Signed)
BP taken on R. Arm is 166/95 map: 115 BP taken on L. Arm is 163/92 map: 112 Pt states he does not have an pain in his chest at the moment, but feels pressure when he presses on his chest.  EKG completed this morning.

## 2019-01-25 NOTE — Progress Notes (Signed)
Progress Note  Patient Name: Scott Ritter Date of Encounter: 01/25/2019  Primary Cardiologist: New, Dr Wyline Mood  Subjective   Some chest pain overnight, resolved this AM  Inpatient Medications    Scheduled Meds: . aspirin EC  325 mg Oral Daily  . atorvastatin  20 mg Oral QPM  . Chlorhexidine Gluconate Cloth  6 each Topical Daily  . cholecalciferol  2,000 Units Oral Daily  . enoxaparin (LOVENOX) injection  40 mg Subcutaneous Q24H  . ferrous sulfate  325 mg Oral BID WC  . hydrALAZINE  50 mg Oral TID  . metoprolol tartrate  25 mg Oral BID   Continuous Infusions: . nitroGLYCERIN 35 mcg/min (01/25/19 0603)   PRN Meds: acetaminophen **OR** acetaminophen, morphine injection, ondansetron **OR** ondansetron (ZOFRAN) IV, oxyCODONE   Vital Signs    Vitals:   01/25/19 0600 01/25/19 0615 01/25/19 0630 01/25/19 0645  BP: (!) 167/92 (!) 166/96 (!) 160/90 (!) 162/85  Pulse: 67 64 66 63  Resp: 17 17 18 17   Temp:      TempSrc:      SpO2: 97% 97% 94% 95%  Weight:      Height:        Intake/Output Summary (Last 24 hours) at 01/25/2019 0847 Last data filed at 01/25/2019 0603 Gross per 24 hour  Intake 1023.27 ml  Output 775 ml  Net 248.27 ml   Last 3 Weights 01/25/2019 01/24/2019 01/24/2019  Weight (lbs) 212 lb 4.9 oz 209 lb 7 oz 208 lb  Weight (kg) 96.3 kg 95 kg 94.348 kg      Telemetry    SR, PACs - Personally Reviewed  ECG    SR, lateral precrodial TWIs - Personally Reviewed  Physical Exam   GEN: No acute distress.   Neck: No JVD Cardiac: RRR, no murmurs, rubs, or gallops.  Respiratory: Clear to auscultation bilaterally. GI: Soft, nontender, non-distended  MS: No edema; No deformity. Neuro:  Nonfocal  Psych: Normal affect  MSK: left chest wall tender to palpation  Labs    Chemistry Recent Labs  Lab 01/24/19 1344 01/25/19 0359  NA 138 137  K 3.1* 3.3*  CL 104 103  CO2 25 25  GLUCOSE 111* 102*  BUN 24* 24*  CREATININE 1.65* 1.47*  CALCIUM 8.9 8.5*   GFRNONAA 46* 53*  GFRAA 53* >60  ANIONGAP 9 9     Hematology Recent Labs  Lab 01/24/19 1344 01/25/19 0359  WBC 11.0* 12.7*  RBC 4.36 4.03*  HGB 12.0* 11.3*  HCT 37.5* 35.1*  MCV 86.0 87.1  MCH 27.5 28.0  MCHC 32.0 32.2  RDW 16.0* 16.1*  PLT 252 249    Cardiac Enzymes Recent Labs  Lab 01/24/19 1344 01/24/19 1714 01/24/19 2309  TROPONINI 0.04* 0.04* 0.04*   No results for input(s): TROPIPOC in the last 168 hours.   BNPNo results for input(s): BNP, PROBNP in the last 168 hours.   DDimer  Recent Labs  Lab 01/24/19 1344  DDIMER <0.27     Radiology    Dg Chest Port 1 View  Result Date: 01/24/2019 CLINICAL DATA:  58 year old male with a history of chest pain with shortness of breath and weakness EXAM: PORTABLE CHEST 1 VIEW COMPARISON:  02/18/2018, 12/16/2016 FINDINGS: Cardiomediastinal silhouette unchanged. No evidence of interlobular septal thickening or central vascular congestion. No pneumothorax. No pleural effusion. No confluent airspace disease. Similar appearance of coarsened interstitial markings. Degenerative changes of the shoulders. IMPRESSION: Negative for acute cardiopulmonary disease Electronically Signed   By: Marijean Niemann  Loreta AveWagner D.O.   On: 01/24/2019 14:02    Cardiac Studies    Patient Profile     Scott Ritter is a 58 y.o. male with a hx of HTN who is being seen today for the evaluation of chest pain at the request of Dr . Kennis CarinaMichael Bero  Assessment & Plan    1. Chest pain - - pain itself is not cardiac in description. Constant from 1230pm to about 2AM last night, better with morphine, worst with movement, breathing, and palpation.  - trop just above level of detection and flat in setting of AKI and severe HTN. Trend is not consistent with ACS. EKG this AM does have some TWIs lateral precordial leads - echo pending - bp's equal in both arms. Symptoms don't really fit for dissection either.   -medical therapy currently with ASA 81, atorva 20,  lopressor 25mg  bid, nitro gtt - f/u echo and symptoms today. At this time most consistent with MSK pain. Would make npo tonight just in case stress testing becomes indicated.    2. Severe HTN - presented with SBP in 200s - started on NG drip in setting of chest pain - headache on NG, not really affecting his chest pain - wean NG to off today, increase his hydralazine to 75mg  daily and add norvasc 5mg  daily.   3. AKI - mild down trend in Cr, still above baselien - management per primary team  4. Hypokalemia - K remains low, redose KCl 40mEq x 2 today.    For questions or updates, please contact [facility] Please consult [email or web address] for contact info under        Signed, Dina RichBranch, Jonathan, MD  01/25/2019, 8:47 AM

## 2019-01-26 DIAGNOSIS — I1310 Hypertensive heart and chronic kidney disease without heart failure, with stage 1 through stage 4 chronic kidney disease, or unspecified chronic kidney disease: Secondary | ICD-10-CM

## 2019-01-26 LAB — BASIC METABOLIC PANEL
Anion gap: 11 (ref 5–15)
BUN: 17 mg/dL (ref 6–20)
CO2: 25 mmol/L (ref 22–32)
Calcium: 9.3 mg/dL (ref 8.9–10.3)
Chloride: 101 mmol/L (ref 98–111)
Creatinine, Ser: 1.18 mg/dL (ref 0.61–1.24)
GFR calc Af Amer: >60 mL/min (ref >60)
GFR calc non Af Amer: >60 mL/min (ref >60)
Glucose, Bld: 101 mg/dL — ABNORMAL HIGH (ref 70–99)
Potassium: 3.3 mmol/L — ABNORMAL LOW (ref 3.5–5.1)
Sodium: 137 mmol/L (ref 135–145)

## 2019-01-26 MED ORDER — AMLODIPINE BESYLATE 10 MG PO TABS: 10.0000 mg | ORAL_TABLET | Freq: Every day | ORAL | 3 refills | Status: DC

## 2019-01-26 MED ORDER — HYDRALAZINE HCL 25 MG PO TABS
100.0000 mg | ORAL_TABLET | Freq: Three times a day (TID) | ORAL | Status: DC
  Administered 2019-01-26 (×2): 100 mg via ORAL
  Filled 2019-01-26 (×2): qty 4

## 2019-01-26 MED ORDER — LISINOPRIL 10 MG PO TABS
20.0000 mg | ORAL_TABLET | Freq: Every day | ORAL | Status: DC
  Administered 2019-01-26: 08:00:00 20 mg via ORAL
  Filled 2019-01-26: qty 2

## 2019-01-26 MED ORDER — HYDRALAZINE HCL 100 MG PO TABS: 100.0000 mg | ORAL_TABLET | Freq: Three times a day (TID) | ORAL | 3 refills | Status: DC

## 2019-01-26 MED ORDER — METHOCARBAMOL 500 MG PO TABS: 500.0000 mg | ORAL_TABLET | Freq: Three times a day (TID) | ORAL | 0 refills | Status: DC | PRN

## 2019-01-26 MED ORDER — METOPROLOL TARTRATE 25 MG PO TABS: 25.0000 mg | ORAL_TABLET | Freq: Two times a day (BID) | ORAL | 3 refills | Status: DC

## 2019-01-26 MED ORDER — METOPROLOL TARTRATE 25 MG PO TABS
25.0000 mg | ORAL_TABLET | Freq: Two times a day (BID) | ORAL | Status: DC
  Administered 2019-01-26: 25 mg via ORAL
  Filled 2019-01-26 (×2): qty 1

## 2019-01-26 MED ORDER — LABETALOL HCL 5 MG/ML IV SOLN: 10.0000 mg | Freq: Four times a day (QID) | INTRAVENOUS | Status: DC | PRN

## 2019-01-26 MED ORDER — LISINOPRIL 20 MG PO TABS: 20.0000 mg | ORAL_TABLET | Freq: Every day | ORAL | 3 refills | Status: DC

## 2019-01-26 MED ORDER — ASPIRIN 81 MG PO TBEC: 81.0000 mg | DELAYED_RELEASE_TABLET | Freq: Every day | ORAL | 3 refills | Status: DC

## 2019-01-26 MED ORDER — AMLODIPINE BESYLATE 5 MG PO TABS
10.0000 mg | ORAL_TABLET | Freq: Every day | ORAL | Status: DC
  Administered 2019-01-26: 10 mg via ORAL
  Filled 2019-01-26: qty 2

## 2019-01-26 NOTE — Care Management Important Message (Signed)
Important Message  Patient Details  Name: Scott Ritter MRN: [id number] Date of Birth: [birth year]   Medicare Important Message Given:  Yes    Corey Harold 01/26/2019, 3:33 PM

## 2019-01-26 NOTE — Discharge Summary (Signed)
Physician Discharge Summary  Scott Ritter WUJ:[id number] DOB: [birth year] DOA: 01/24/2019  PCP: Wilson SingerGosrani, Nimish C, MD  Admit date: 01/24/2019 Discharge date: 01/26/2019  Time spent: 35 minutes  Recommendations for Outpatient Follow-up:  1. Reassess blood pressure and further adjust antihypertensive regimen as needed 2. Repeat basic metabolic panel to follow electrolytes and renal function.   Discharge Diagnoses:  Active Problems:   Hyperlipidemia   Iron deficiency anemia due to chronic blood loss   Malignant HTN with heart disease, w/o CHF, with chronic kidney disease   Chest pain in adult   Acute renal failure superimposed on stage 2 chronic kidney disease (HCC)   PSVT (paroxysmal supraventricular tachycardia) (HCC)   Elevated troponin Prior history of stroke with mild left residual deficit.  Discharge Condition: Stable and improved.  Patient discharged home with instructions to follow-up with PCP and cardiology as an outpatient.  Diet recommendation: Heart healthy diet.  Filed Weights   01/24/19 1730 01/25/19 0500 01/26/19 0500  Weight: 95 kg 96.3 kg 94.7 kg    History of present illness:  As per H&P written by Dr. Onalee Huaavid Tat on 01/24/2019 58 y.o. male with medical history of stroke with left hemiparesis, hyperlipidemia, hypertension, GI bleed November 2019 presenting with onset of sharp left-sided chest pain that began at 1 PM on 01/24/2019.  The patient states that he was sitting in his chair watching television when it occurred.  He had some associated shortness of breath but denied any radiation of his chest pain, dizziness, syncope.  He described his chest pain as severe 9/10 sharp in nature that was worsened with movement as well as palpation.  There was also a pleuritic type component worsening with a deep breath.  The patient had denied any fevers, chills, coughing, nausea, vomiting, diarrhea, headache, neck pain, sore throat, dysuria, hematuria, hematochezia,  melena. Notably, the patient states that 2 out of the 7 days of the week he would miss taking his antihypertensive medications. The patient had a recent admission to the hospital from 08/17/2018 through 08/19/2018 during which the patient had a GI bleed.  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  There was also a single nonbleeding erosion and a nonbleeding ulcer in the distal ileum.  This was due in part due to the patient's NSAID use in the past.  He was transfused 2 units PRBC during the hospitalization.  He denies any bleeding since that period of time. In the emergency department, the patient was afebrile with blood pressure of two 201/115.  Oxygen saturation was 99% on room air.  BMP showed a serum creatinine of 1.65 which is above his usual baseline.  Potassium was 3.1.  WBC was 1.0 hemoglobin 12.0 and platelets 252.  The patient was started on nitroglycerin drip and admitted for further evaluation.   Hospital Course:  1-malignant hypertension/hypertensive emergency -Patient treated with nitroglycerin drip with adequate response -Medications titrated to achieve good blood pressure control off drip -At discharge he will use hydralazine 100 mg 3 times a day, metoprolol 25 mg twice a day, amlodipine 10 mg daily, lisinopril 20 mg daily. -Advised to follow low-sodium diet. -Repeat blood pressure and further adjust medication regimen at follow-up visit.  2-chest pain/flat elevation on troponin -Atypical in nature -Appears to be associated with musculoskeletal origin -Troponin 0 0.04 throughout hospitalization (x4). -EKG without concerning for acute ischemic changes -2D echo reassuring with normal ejection fraction and no wall motion  abnormalities. -Patient continue aspirin, beta-blockers, low-sodium diet and statins. -Continue blood pressure control. -PRN Robaxin has been prescribed.  3-acute on chronic renal failure  stage II at baseline -Baseline creatinine 1.2-1.3 as per records. -Patient serum creatinine peaked at 1.65 -Losartan/HCTZ discontinued -No signs of acute infection in his urine appreciated. -Renal ultrasound demonstrated no obstructive uropathy. -At discharge creatinine 1.18 -Safe to start patient on lisinopril daily as recommended by cardiology service. -Repeat basic metabolic panel follow-up visit to reassess electrolytes and renal function.  4-PSVT -Following recommendations by cardiology metoprolol was started 25 mg twice a day -Rate control as appreciated on telemetry monitoring -Repeat basic metabolic panels to follow electrolytes and optimize level as needed.  5-GERD/erosive gastritis -Continue PPI -No signs of acute bleeding appreciated.  6-hypokalemia -Magnesium within normal limits -Potassium repleted -Patient discharged on ACE inhibitors. -Repeat basic metabolic panel to follow electrolytes trend  7-prior history of stroke -Continue aspirin for secondary prevention -Patient instructed to follow low-sodium diet and will benefit of close follow-up and further control of blood pressure.  Procedures: 2D echo: Demonstrated normal ejection fraction, no wall motion abnormalities.  Consultations:  Cardiology service  Discharge Exam: Vitals:   01/26/19 1345 01/26/19 1400  BP: (!) 162/84 (!) 159/90  Pulse: 65 64  Resp: 17 18  Temp:    SpO2: 97% 97%    General: Afebrile, no chest pain, reports no shortness of breath, no nausea or vomiting.  Patient expressed headaches after nitroglycerin therapy previously provided.  Afebrile, no chest pain, no nausea, no vomiting.  Reports no shortness of breath.  Expressed headache from nitroglycerin treatment provided earlier. Cardiovascular: S1 and S2, no rubs, no gallops, no murmurs. Respiratory: Good air movement bilaterally, normal respiratory effort.  No wheezing no crackles. Abdomen: Soft, nontender, nondistended, positive  bowel sounds Extremities: No cyanosis, no clubbing, no edema.  Discharge Instructions   Discharge Instructions    Diet - low sodium heart healthy   Complete by:  As directed    Discharge instructions   Complete by:  As directed    Take medications as prescribed Follow heart healthy diet (less than 2.5 g of sodium daily basis) Arrange follow-up with PCP in 10 days Maintain adequate hydration. Follow-up with cardiology service as instructed.     Allergies as of 01/26/2019   No Known Allergies     Medication List    STOP taking these medications   losartan-hydrochlorothiazide 100-12.5 MG tablet Commonly known as:  HYZAAR     TAKE these medications   amLODipine 10 MG tablet Commonly known as:  NORVASC Take 1 tablet (10 mg total) by mouth daily. Start taking on:  January 27, 2019   aspirin 81 MG EC tablet Take 1 tablet (81 mg total) by mouth daily. Start taking on:  January 27, 2019   atorvastatin 20 MG tablet Commonly known as:  LIPITOR Take 20 mg by mouth every evening.   ferrous sulfate 325 (65 FE) MG tablet Take 1 tablet (325 mg total) by mouth 2 (two) times daily with a meal.   hydrALAZINE 100 MG tablet Commonly known as:  APRESOLINE Take 1 tablet (100 mg total) by mouth 3 (three) times daily. What changed:    medication strength  how much to take   lisinopril 20 MG tablet Commonly known as:  PRINIVIL,ZESTRIL Take 1 tablet (20 mg total) by mouth daily. Start taking on:  January 27, 2019   methocarbamol 500 MG tablet Commonly known as:  ROBAXIN Take 1 tablet (500 mg total)  by mouth every 8 (eight) hours as needed for muscle spasms.   metoprolol tartrate 25 MG tablet Commonly known as:  LOPRESSOR Take 1 tablet (25 mg total) by mouth 2 (two) times daily.   potassium chloride SA 20 MEQ tablet Commonly known as:  K-DUR,KLOR-CON Take 1 tablet (20 mEq total) by mouth daily. What changed:  when to take this   Vitamin D 50 MCG (2000 UT) Caps Take 2,000 Units  by mouth daily.      No Known Allergies Follow-up Information    Wilson Singer, MD. Schedule an appointment as soon as possible for a visit in 10 day(s).   Specialty:  Internal Medicine Contact information: [address] [place] [address] [phone]           The results of significant diagnostics from this hospitalization (including imaging, microbiology, ancillary and laboratory) are listed below for reference.    Significant Diagnostic Studies: Dg Chest Port 1 View  Result Date: 01/24/2019 CLINICAL DATA:  58 year old male with a history of chest pain with shortness of breath and weakness EXAM: PORTABLE CHEST 1 VIEW COMPARISON:  02/18/2018, 12/16/2016 FINDINGS: Cardiomediastinal silhouette unchanged. No evidence of interlobular septal thickening or central vascular congestion. No pneumothorax. No pleural effusion. No confluent airspace disease. Similar appearance of coarsened interstitial markings. Degenerative changes of the shoulders. IMPRESSION: Negative for acute cardiopulmonary disease Electronically Signed   By: Gilmer Mor D.O.   On: 01/24/2019 14:02    Microbiology: Recent Results (from the past 240 hour(s))  MRSA PCR Screening     Status: None   Collection Time: 01/24/19  3:56 PM  Result Value Ref Range Status   MRSA by PCR NEGATIVE NEGATIVE Final    Comment:        The GeneXpert MRSA Assay (FDA approved for NASAL specimens only), is one component of a comprehensive MRSA colonization surveillance program. It is not intended to diagnose MRSA infection nor to guide or monitor treatment for MRSA infections. Performed at [facility], [address], [place], [place] [address]      Labs: Basic Metabolic Panel: Recent Labs  Lab 01/24/19 1344 01/25/19 0359 01/26/19 0415  NA 138 137 137  K 3.1* 3.3* 3.3*  CL 104 103 101  CO2 25 25 25   GLUCOSE 111* 102* 101*  BUN 24* 24* 17  CREATININE 1.65* 1.47* 1.18  CALCIUM 8.9 8.5* 9.3  MG 1.9 1.8  --     CBC: Recent Labs  Lab 01/24/19 1344 01/25/19 0359  WBC 11.0* 12.7*  HGB 12.0* 11.3*  HCT 37.5* 35.1*  MCV 86.0 87.1  PLT 252 249   Cardiac Enzymes: Recent Labs  Lab 01/24/19 1344 01/24/19 1714 01/24/19 2309  TROPONINI 0.04* 0.04* 0.04*   BNP: BNP (last 3 results) Recent Labs    02/18/18 1230  BNP 283.0*   Signed:  Vassie Loll MD.  [facility] 01/26/2019, 3:27 PM

## 2019-01-26 NOTE — Progress Notes (Signed)
Manual BP taken in R arm-220/100 & 230/38 ( 2 nurses verified) Manual BP taken in L arm- 220/90- Dr. Antionette Char paged and made aware.

## 2019-01-26 NOTE — Progress Notes (Signed)
Pt c/o 9/10 headache- Morphine given per MD order BP on L arm taken manually to recheck after Hydralazine and Metoprolol given- 192/100.  Pt not c/o any chest pain- wants to know if medication can be changed to something else that won't cause headache.

## 2019-01-26 NOTE — Progress Notes (Signed)
Patient received discharge instructions and verbalized understanding. One peripheral IV removed prior to discharge. Patient taken down via wheelchair to be discharged to home.

## 2019-01-26 NOTE — Progress Notes (Signed)
Dr. Antionette Char made aware of pts BP- order to give Hydralazine and Metoprolol now as scheduled meds now- increase drip per protocol.

## 2019-01-26 NOTE — Progress Notes (Signed)
Progress Note  Patient Name: Duffy Bruceorris N Asmus Date of Encounter: 01/26/2019  Primary Cardiologist: New, Dr Wyline MoodBranch  Subjective   No chest pain  Inpatient Medications    Scheduled Meds: . amLODipine  10 mg Oral Daily  . aspirin EC  81 mg Oral Daily  . atorvastatin  20 mg Oral QPM  . Chlorhexidine Gluconate Cloth  6 each Topical Daily  . cholecalciferol  2,000 Units Oral Daily  . enoxaparin (LOVENOX) injection  40 mg Subcutaneous Q24H  . ferrous sulfate  325 mg Oral BID WC  . hydrALAZINE  100 mg Oral TID  . lisinopril  20 mg Oral Daily  . methocarbamol  500 mg Oral TID  . metoprolol tartrate  25 mg Oral BID   Continuous Infusions:  PRN Meds: acetaminophen **OR** acetaminophen, labetalol, morphine injection, ondansetron **OR** ondansetron (ZOFRAN) IV, oxyCODONE   Vital Signs    Vitals:   01/26/19 0827 01/26/19 0830 01/26/19 0845 01/26/19 0900  BP:  (!) 171/94 (!) 155/76 (!) 176/73  Pulse: 61 63 (!) 57 63  Resp: 13 19 17 19   Temp:      TempSrc:      SpO2: 98% 96% 97% 96%  Weight:      Height:        Intake/Output Summary (Last 24 hours) at 01/26/2019 0956 Last data filed at 01/26/2019 0903 Gross per 24 hour  Intake 566.33 ml  Output 2275 ml  Net -1708.67 ml   Last 3 Weights 01/26/2019 01/25/2019 01/24/2019  Weight (lbs) 208 lb 12.4 oz 212 lb 4.9 oz 209 lb 7 oz  Weight (kg) 94.7 kg 96.3 kg 95 kg      Telemetry    SR, occasional PACs, PVCs - Personally Reviewed  ECG    na  Physical Exam   Not performed, low yield. Attempting to decrease patient-physician contact in setting of COVID-19 risk.   Labs    Chemistry Recent Labs  Lab 01/24/19 1344 01/25/19 0359 01/26/19 0415  NA 138 137 137  K 3.1* 3.3* 3.3*  CL 104 103 101  CO2 25 25 25   GLUCOSE 111* 102* 101*  BUN 24* 24* 17  CREATININE 1.65* 1.47* 1.18  CALCIUM 8.9 8.5* 9.3  GFRNONAA 46* 53* >60  GFRAA 53* >60 >60  ANIONGAP 9 9 11      Hematology Recent Labs  Lab 01/24/19 1344 01/25/19  0359  WBC 11.0* 12.7*  RBC 4.36 4.03*  HGB 12.0* 11.3*  HCT 37.5* 35.1*  MCV 86.0 87.1  MCH 27.5 28.0  MCHC 32.0 32.2  RDW 16.0* 16.1*  PLT 252 249    Cardiac Enzymes Recent Labs  Lab 01/24/19 1344 01/24/19 1714 01/24/19 2309  TROPONINI 0.04* 0.04* 0.04*   No results for input(s): TROPIPOC in the last 168 hours.   BNPNo results for input(s): BNP, PROBNP in the last 168 hours.   DDimer  Recent Labs  Lab 01/24/19 1344  DDIMER <0.27     Radiology    Dg Chest Port 1 View  Result Date: 01/24/2019 CLINICAL DATA:  58 year old male with a history of chest pain with shortness of breath and weakness EXAM: PORTABLE CHEST 1 VIEW COMPARISON:  02/18/2018, 12/16/2016 FINDINGS: Cardiomediastinal silhouette unchanged. No evidence of interlobular septal thickening or central vascular congestion. No pneumothorax. No pleural effusion. No confluent airspace disease. Similar appearance of coarsened interstitial markings. Degenerative changes of the shoulders. IMPRESSION: Negative for acute cardiopulmonary disease Electronically Signed   By: Gilmer MorJaime  Wagner D.O.   On: 01/24/2019  14:02    Cardiac Studies    Patient Profile     Loreto Vince Beveridgeis a 58 y.o.malewith a hx of HTNwho is being seen today for the evaluation of chest painat the request of Dr.Michael Bero  Assessment & Plan      1. Chest pain - pain itself is not cardiac in description. Constant from 1230pm to about 2AM last night, better with morphine, worst with movement, breathing, and palpation. - trop just above level of detection and flat in setting of AKI and severe HTN. Trend is not consistent with ACS. EKG this AM does have some TWIs lateral precordial leads - echo shows normal LVEF, no WMAs - bp's equal in both arms. Symptoms don't really fit for dissection either.   - no further workup planned, his symptoms are most consistent with MSK chest pain.   2. Severe HTN - presented with SBP in 200s - started  on NG drip in setting of chest pain - headache on NG, not really affecting his chest pain - started norvasc and increased to 10mg  daily, started lisinopril 20mg  daily. He is on lopressor 25mg  bid and hydralazine increased to 100mg  tid.    Would work to optimize bp control and get of NG drip, If stable on oral regimen and SBPs <160 would be reasonable for discharge.      For questions or updates, please contact [facility] Please consult [email or web address] for contact info under        Signed, Dina Rich, MD  01/26/2019, 9:56 AM

## 2019-01-31 DIAGNOSIS — E559 Vitamin D deficiency, unspecified: Secondary | ICD-10-CM | POA: Diagnosis not present

## 2019-01-31 DIAGNOSIS — I10 Essential (primary) hypertension: Secondary | ICD-10-CM | POA: Diagnosis not present

## 2019-01-31 DIAGNOSIS — E876 Hypokalemia: Secondary | ICD-10-CM | POA: Diagnosis not present

## 2019-02-14 ENCOUNTER — Ambulatory Visit (INDEPENDENT_AMBULATORY_CARE_PROVIDER_SITE_OTHER): Payer: Medicare HMO | Admitting: Nurse Practitioner

## 2019-02-14 ENCOUNTER — Encounter: Payer: Self-pay | Admitting: Gastroenterology

## 2019-02-14 ENCOUNTER — Encounter: Payer: Self-pay | Admitting: Nurse Practitioner

## 2019-02-14 ENCOUNTER — Other Ambulatory Visit: Payer: Self-pay

## 2019-02-14 DIAGNOSIS — D500 Iron deficiency anemia secondary to blood loss (chronic): Secondary | ICD-10-CM

## 2019-02-14 DIAGNOSIS — K921 Melena: Secondary | ICD-10-CM | POA: Diagnosis not present

## 2019-02-14 NOTE — Progress Notes (Signed)
CC'ED TO PCP 

## 2019-02-14 NOTE — Patient Instructions (Signed)
Your health issues we discussed today were:   Anemia with previous bleeding: 1. Your recent labs have looked good 2. I am glad you are not seeing any more bleeding.  If you do see any more obvious bleeding notify us immediately 3. If you start having symptoms of blood loss such as weakness, fatigue, dizziness, lightheadedness, shortness of breath pattern call our office immediately 4. Bring a copy of your labs by our office when you get a chance.  Overall I recommend:  1. Continue your other current medications 2. Call us if you have any questions or concerns 3. Follow-up in 6 months.   Because of recent events of COVID-19 ("Coronavirus"), follow CDC recommendations:  1. Wash your hand frequently 2. Avoid touching your face 3. Stay away from people who are sick 4. If you have symptoms such as fever, cough, shortness of breath then call your healthcare provider for further guidance 5. If you are sick, STAY AT HOME unless otherwise directed by your healthcare provider. 6. Follow directions from state and national officials regarding staying safe   At [facility] we value your feedback. You may receive a survey about your visit today. Please share your experience as we strive to create trusting relationships with our patients to provide genuine, compassionate, quality care.  We appreciate your understanding and patience as we review any laboratory studies, imaging, and other diagnostic tests that are ordered as we care for you. Our office policy is 5 business days for review of these results, and any emergent or urgent results are addressed in a timely manner for your best interest. If you do not hear from our office in 1 week, please contact us.   We also encourage the use of MyChart, which contains your medical information for your review as well. If you are not enrolled in this feature, an access code is on this after visit summary for your convenience. Thank you for  allowing [place] to be involved in your care.  It was great to see you today!  I hope you have a great day!!

## 2019-02-14 NOTE — Assessment & Plan Note (Signed)
Previous admission for anemia and is undergone significant work-up including EGD, Givens capsule endoscopy, push enteroscopy with APC ablation of angiectasia's and AVM.  He has been quite well since then.  Serial CBCs and iron panel showed significant and steady improvement.  His anemia is now essentially resolved based on most updated labs.  He is having labs redrawn this week with primary care and he states he will bring a copy by our office.  Recommend he continue to monitor for any recurrent bleeding and to notify us immediately if they have any.  At this point he has done well for a number of months and he can follow-up in 6 months.  Call with any questions or problems before then.

## 2019-02-14 NOTE — Assessment & Plan Note (Signed)
The patient does note occasional dark stools, as well as green stools on iron twice daily.  No symptoms of anemia.  Labs have looked good on serial rechecks over the past several months.  He will have another recheck this week and will bring this by our office.  Recommend he follow-up for any obvious bleeding or worsening anemia symptoms.

## 2019-02-14 NOTE — Progress Notes (Signed)
Referring Provider: Wilson Singer, MD Primary Care Physician:  Wilson Singer, MD Primary GI:  Dr. Darrick Penna  NOTE: Service was provided via telemedicine and was requested by the patient due to COVID-19 pandemic.  Method of visit: Doxy.Me  Patient Location: Home  Provider Location: Office  Reason for Phone Visit: Follow-up  The patient was consented to phone follow-up via telephone encounter including billing of the encounter (yes/no): Yes  Persons present on the phone encounter, with roles: Mother  Total time (minutes) spent on medical discussion: 16 minutes  Chief Complaint  Patient presents with   Follow-up    Anemia    HPI:   Scott Ritter is a 58 y.o. male who presents for virtual visit regarding: follow-up on anemia.  The patient was last seen by our service during her hospitalization from 08/17/2018 through 08/19/2018 for acute blood loss anemia due to GI bleed.  Noted history of erosive gastritis secondary to NSAIDs.  Required 2 units of PRBCs during hospitalization.  EGD 08/18/2018 showed no clear source for bleeding with capsule endoscopy placed.  Hemoglobin had a low point at 7.2 and at discharge was 9.9.  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  At 3 hours 37 minutes 27 seconds a single nonbleeding erosion noted.  At 3 hours 43 minutes and 37 seconds there is a questionable ulcer noted to be nonbleeding.  Recommend continue to avoid NSAIDs, start Protonix 40 mg daily indefinitely, EGD/push enteroscopy for AVM ablation, follow hemoglobin, oral iron.  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  Surgical oncology follow biopsies to be duodenal mucosa without specific histopathological changes.  Recommended repeat CBC in  January 2020, follow-up in 4 months.  CBC completed 10/19/2018 and found hemoglobin improved to 11.8.  Recommended repeat CBC and ferritin in 1 month.  CBC completed 11/22/2018 found hemoglobin improved to 12.7.  Ferritin improved from 3 4 months prior to 48.  Most recent CBC completed 01/25/2019 found hemoglobin essentially stable at 11.3.  Today he states he's doing well overall. Denies any further hematochezia, melena; does have some dark stools and/or green stools on iron which he takes twice a day. Denies fatigue, dizziness, lightheadedness. Denies abdominal pain, N/V, fever, chills, unintentional weight loss. Denies URI and flu-like symptoms. Denies chest pain, dyspnea, dizziness, lightheadedness, syncope, near syncope. Denies any other upper or lower GI symptoms.  He has bloodwork coming up this week and will bring [place] a copy.  Past Medical History:  Diagnosis Date   Hypertension    Stroke [facility])     Past Surgical History:  Procedure Laterality Date   BACK SURGERY     BIOPSY  09/17/2018   Procedure: BIOPSY;  Surgeon: West Bali, MD;  [facility];  Service: Endoscopy;;  duodenal biopsy    COLONOSCOPY N/A 02/29/2016   Dr. Darrick Penna: redundant colon, non-bleeding internal hemorrhoids    ESOPHAGOGASTRODUODENOSCOPY N/A 12/17/2016   Dr. Darrick Penna: 3 mm nonbleeding Mallory-Weiss tear.  EGD performed for hematemesis.  Hemoglobin normal.   ESOPHAGOGASTRODUODENOSCOPY N/A 02/19/2018   Dr. Jena Gauss: Performed for melena, hemoglobin of 6.  Mucosal changes in the esophagus query short segment Barrett's, biopsy more consistent with reflux changes.  Erosive gastropathy but no H. pylori.  Duodenal erosions.  Suspected NSAID induced injury.   ESOPHAGOGASTRODUODENOSCOPY  08/18/2018   Dr. Karilyn Cota: IDA/heme positive stool.  Esophageal mucosal changes consistent with short segment Barrett's esophagus, not biopsied.  2 cm hiatal hernia.  Duodenal bulb, second portion of duodenum, third portion of  the duodenum.  Video capsule somewhat difficult to pass through the oropharynx but was eventually advanced into the second portion of the duodenum and released.   ESOPHAGOGASTRODUODENOSCOPY N/A 08/18/2018   Procedure: ESOPHAGOGASTRODUODENOSCOPY (EGD);  Surgeon: Malissa Hippo, MD;  [facility];  Service: Endoscopy;  Laterality: N/A;   ESOPHAGOGASTRODUODENOSCOPY N/A 09/17/2018   Procedure: ENTEROSCOPY;  Surgeon: West Bali, MD;  [facility];  Service: Endoscopy;  Laterality: N/A;   GIVENS CAPSULE STUDY N/A 08/18/2018   Procedure: GIVENS CAPSULE STUDY;  Surgeon: Malissa Hippo, MD;  [facility];  Service: Endoscopy;  Laterality: N/A;   HERNIA REPAIR      Current Outpatient Medications  Medication Sig Dispense Refill   aspirin EC 81 MG EC tablet Take 1 tablet (81 mg total) by mouth daily. 30 tablet 3   atorvastatin (LIPITOR) 20 MG tablet Take 20 mg by mouth every evening.      Cholecalciferol (VITAMIN D) 2000 units CAPS Take 2,000 Units by mouth daily.     ferrous sulfate 325 (65 FE) MG tablet Take 1 tablet (325 mg total) by mouth 2 (two) times daily with a meal. 60 tablet 3   hydrALAZINE (APRESOLINE) 100 MG tablet Take 1 tablet (100 mg total) by mouth 3 (three) times daily. 90 tablet 3   losartan-hydrochlorothiazide (HYZAAR) 100-12.5 MG tablet Take 1 tablet by mouth daily.     potassium chloride SA (K-DUR,KLOR-CON) 20 MEQ tablet Take 1 tablet (20 mEq total) by mouth daily. (Patient taking differently: Take 20 mEq by mouth 2 (two) times daily. ) 30 tablet 0   No current facility-administered medications for this visit.     Allergies as of 02/14/2019   (No Known Allergies)    Family History  Problem Relation Age of Onset   Hypertension Father    Colon cancer Neg Hx    Colon polyps Neg Hx     Social History   Socioeconomic History   Marital status: Married    Spouse name: Not on file   Number of children: Not on file    Years of education: Not on file   Highest education level: Not on file  Occupational History   Not on file  Social Needs   Financial resource strain: Not on file   Food insecurity:    Worry: Not on file    Inability: Not on file   Transportation needs:    Medical: Not on file    Non-medical: Not on file  Tobacco Use   Smoking status: Never Smoker   Smokeless tobacco: Never Used  Substance and Sexual Activity   Alcohol use: Not Currently    Comment: occasionally   Drug use: No   Sexual activity: Not on file  Lifestyle   Physical activity:    Days per week: Not on file    Minutes per session: Not on file   Stress: Not on file  Relationships   Social connections:    Talks on phone: Not on file    Gets together: Not on file    Attends religious service: Not on file    Active member of club or organization: Not on file    Attends meetings of clubs or organizations: Not on file    Relationship status:  Not on file  Other Topics Concern   Not on file  Social History Narrative   Not on file    Review of Systems: Complete ROS negative except as per HPI.  Physical Exam: Note: limited exam due to virtual visit General:   Alert and oriented. Pleasant and cooperative. Well-nourished and well-developed.  Head:  Normocephalic and atraumatic. Eyes:  Without icterus, sclera clear and conjunctiva pink.  Ears:  Normal auditory acuity. Skin:  Intact without facial significant lesions or rashes. Neurologic:  Alert and oriented x4;  grossly normal neurologically. Psych:  Alert and cooperative. Normal mood and affect. Heme/Lymph/Immune: No excessive bruising noted.

## 2019-02-15 DIAGNOSIS — E876 Hypokalemia: Secondary | ICD-10-CM | POA: Diagnosis not present

## 2019-02-15 DIAGNOSIS — I10 Essential (primary) hypertension: Secondary | ICD-10-CM | POA: Diagnosis not present

## 2019-02-15 DIAGNOSIS — E7849 Other hyperlipidemia: Secondary | ICD-10-CM | POA: Diagnosis not present

## 2019-02-15 DIAGNOSIS — E559 Vitamin D deficiency, unspecified: Secondary | ICD-10-CM | POA: Diagnosis not present

## 2019-02-15 DIAGNOSIS — E785 Hyperlipidemia, unspecified: Secondary | ICD-10-CM | POA: Diagnosis not present

## 2019-02-15 DIAGNOSIS — D649 Anemia, unspecified: Secondary | ICD-10-CM | POA: Diagnosis not present

## 2019-02-21 DIAGNOSIS — E876 Hypokalemia: Secondary | ICD-10-CM | POA: Diagnosis not present

## 2019-02-21 DIAGNOSIS — I10 Essential (primary) hypertension: Secondary | ICD-10-CM | POA: Diagnosis not present

## 2019-02-21 DIAGNOSIS — D649 Anemia, unspecified: Secondary | ICD-10-CM | POA: Diagnosis not present

## 2019-02-25 DIAGNOSIS — D509 Iron deficiency anemia, unspecified: Secondary | ICD-10-CM | POA: Diagnosis not present

## 2019-02-25 LAB — CBC WITH DIFFERENTIAL/PLATELET
Absolute Monocytes: 890 cells/uL (ref 200–950)
Basophils Absolute: 95 cells/uL (ref 0–200)
Basophils Relative: 0.9 %
Eosinophils Absolute: 509 cells/uL — ABNORMAL HIGH (ref 15–500)
Eosinophils Relative: 4.8 %
HCT: 36.5 % — ABNORMAL LOW (ref 38.5–50.0)
Hemoglobin: 12.1 g/dL — ABNORMAL LOW (ref 13.2–17.1)
Lymphs Abs: 1760 cells/uL (ref 850–3900)
MCH: 28.1 pg (ref 27.0–33.0)
MCHC: 33.2 g/dL (ref 32.0–36.0)
MCV: 84.9 fL (ref 80.0–100.0)
MPV: 10.5 fL (ref 7.5–12.5)
Monocytes Relative: 8.4 %
Neutro Abs: 7346 cells/uL (ref 1500–7800)
Neutrophils Relative %: 69.3 %
Platelets: 293 10*3/uL (ref 140–400)
RBC: 4.3 10*6/uL (ref 4.20–5.80)
RDW: 15.4 % — ABNORMAL HIGH (ref 11.0–15.0)
Total Lymphocyte: 16.6 %
WBC: 10.6 10*3/uL (ref 3.8–10.8)

## 2019-02-25 LAB — FERRITIN: Ferritin: 42 ng/mL (ref 38–380)

## 2019-03-02 ENCOUNTER — Other Ambulatory Visit: Payer: Self-pay

## 2019-03-02 DIAGNOSIS — D509 Iron deficiency anemia, unspecified: Secondary | ICD-10-CM

## 2019-03-02 NOTE — Progress Notes (Signed)
Pt is aware and lab orders on file for 3 months.

## 2019-04-05 ENCOUNTER — Ambulatory Visit (INDEPENDENT_AMBULATORY_CARE_PROVIDER_SITE_OTHER): Payer: Medicare HMO | Admitting: Internal Medicine

## 2019-05-09 ENCOUNTER — Other Ambulatory Visit: Payer: Self-pay

## 2019-05-09 DIAGNOSIS — D509 Iron deficiency anemia, unspecified: Secondary | ICD-10-CM

## 2019-05-11 DIAGNOSIS — I10 Essential (primary) hypertension: Secondary | ICD-10-CM | POA: Diagnosis not present

## 2019-05-11 DIAGNOSIS — M79673 Pain in unspecified foot: Secondary | ICD-10-CM | POA: Diagnosis not present

## 2019-05-19 ENCOUNTER — Ambulatory Visit: Payer: Commercial Managed Care - HMO | Admitting: Podiatry

## 2019-05-31 DIAGNOSIS — D509 Iron deficiency anemia, unspecified: Secondary | ICD-10-CM | POA: Diagnosis not present

## 2019-05-31 LAB — CBC WITH DIFFERENTIAL/PLATELET
Absolute Monocytes: 648 cells/uL (ref 200–950)
Basophils Absolute: 74 cells/uL (ref 0–200)
Basophils Relative: 0.9 %
Eosinophils Absolute: 189 cells/uL (ref 15–500)
Eosinophils Relative: 2.3 %
HCT: 38.8 % (ref 38.5–50.0)
Hemoglobin: 12.9 g/dL — ABNORMAL LOW (ref 13.2–17.1)
Lymphs Abs: 1722 cells/uL (ref 850–3900)
MCH: 28.3 pg (ref 27.0–33.0)
MCHC: 33.2 g/dL (ref 32.0–36.0)
MCV: 85.1 fL (ref 80.0–100.0)
MPV: 11 fL (ref 7.5–12.5)
Monocytes Relative: 7.9 %
Neutro Abs: 5568 cells/uL (ref 1500–7800)
Neutrophils Relative %: 67.9 %
Platelets: 294 10*3/uL (ref 140–400)
RBC: 4.56 10*6/uL (ref 4.20–5.80)
RDW: 14.4 % (ref 11.0–15.0)
Total Lymphocyte: 21 %
WBC: 8.2 10*3/uL (ref 3.8–10.8)

## 2019-05-31 LAB — FERRITIN: Ferritin: 42 ng/mL (ref 38–380)

## 2019-06-02 DIAGNOSIS — I10 Essential (primary) hypertension: Secondary | ICD-10-CM | POA: Diagnosis not present

## 2019-06-02 DIAGNOSIS — E876 Hypokalemia: Secondary | ICD-10-CM | POA: Diagnosis not present

## 2019-06-16 ENCOUNTER — Ambulatory Visit: Payer: Medicare HMO | Admitting: Podiatry

## 2019-06-23 ENCOUNTER — Ambulatory Visit (INDEPENDENT_AMBULATORY_CARE_PROVIDER_SITE_OTHER): Payer: Medicare HMO

## 2019-06-23 ENCOUNTER — Other Ambulatory Visit: Payer: Self-pay | Admitting: Podiatry

## 2019-06-23 ENCOUNTER — Ambulatory Visit: Payer: Medicare HMO | Admitting: Podiatry

## 2019-06-23 ENCOUNTER — Other Ambulatory Visit: Payer: Self-pay

## 2019-06-23 DIAGNOSIS — M6240 Contracture of muscle, unspecified site: Secondary | ICD-10-CM | POA: Diagnosis not present

## 2019-06-23 DIAGNOSIS — M205X2 Other deformities of toe(s) (acquired), left foot: Secondary | ICD-10-CM

## 2019-06-23 DIAGNOSIS — M79672 Pain in left foot: Secondary | ICD-10-CM

## 2019-06-27 ENCOUNTER — Other Ambulatory Visit (INDEPENDENT_AMBULATORY_CARE_PROVIDER_SITE_OTHER): Payer: Self-pay

## 2019-06-27 ENCOUNTER — Telehealth (INDEPENDENT_AMBULATORY_CARE_PROVIDER_SITE_OTHER): Payer: Self-pay

## 2019-06-27 DIAGNOSIS — M79673 Pain in unspecified foot: Secondary | ICD-10-CM

## 2019-06-27 NOTE — Progress Notes (Unsigned)
Pt called in severe pain since last OV on [address] last week. Pt has not been able to see. Hard and difficult to walk also. Pt has been having foot and leg trouble for past 2 years. Pt requested new provider then one he was referred to 1st past month. Pt is unhappy and in severe pain.

## 2019-06-27 NOTE — Telephone Encounter (Signed)
Sent referral out today in [place].

## 2019-06-27 NOTE — Telephone Encounter (Signed)
Pt went to the foot Dr for the pain. He was told by the to take the tylenol. Pt was told by  Foot provider  "he don't do pain medications". Pt was told he might need to have surgery. Pt said he is ready for Foot Dr  to do something. Pt is said he been dealing with 2 yrs. Pt stated he should not have to call 2 different providers and continuous be in pain & no help at his time. Pt wants to feel better. Pt stated he already has to walk and wear brace for 2 yrs of the stroke. Pt is asking will someone give him something to help. Pt stated that he was not impressed with the Foot doctor her was referred to. He seems not to want to help him. Would like to see someone else.

## 2019-06-29 ENCOUNTER — Other Ambulatory Visit (INDEPENDENT_AMBULATORY_CARE_PROVIDER_SITE_OTHER): Payer: Self-pay | Admitting: Internal Medicine

## 2019-07-13 NOTE — Progress Notes (Signed)
  Subjective:  Patient ID: Scott Ritter, male    DOB: 02-06-1961,  MRN: [id number]  Chief Complaint  Patient presents with  . Foot Pain    Pt states left sub 1st pain which is severe and sharp in quality, radiates up into leg, and is constant regardless of being at rest or bearing weight. Pt states no known injuries, 1 year duration.    58 y.o. male presents with the above complaint. Hx above confirmed with patient.   Review of Systems: Negative except as noted in the HPI. Denies N/V/F/Ch.  Past Medical History:  Diagnosis Date  . Hypertension   . Stroke [facility])     Current Outpatient Medications:  .  atorvastatin (LIPITOR) 20 MG tablet, Take 20 mg by mouth every evening. , Disp: , Rfl:  .  Cholecalciferol (VITAMIN D) 2000 units CAPS, Take 2,000 Units by mouth daily., Disp: , Rfl:  .  hydrALAZINE (APRESOLINE) 100 MG tablet, Take 1 tablet (100 mg total) by mouth 3 (three) times daily., Disp: 90 tablet, Rfl: 3 .  hydrALAZINE (APRESOLINE) 50 MG tablet, , Disp: , Rfl:  .  losartan-hydrochlorothiazide (HYZAAR) 100-12.5 MG tablet, Take 1 tablet by mouth daily., Disp: , Rfl:  .  metoprolol tartrate (LOPRESSOR) 25 MG tablet, , Disp: , Rfl:  .  potassium chloride SA (K-DUR,KLOR-CON) 20 MEQ tablet, Take 1 tablet (20 mEq total) by mouth daily. (Patient taking differently: Take 20 mEq by mouth 2 (two) times daily. ), Disp: 30 tablet, Rfl: 0 .  aspirin EC 81 MG EC tablet, Take 1 tablet (81 mg total) by mouth daily. (Patient not taking: Reported on 06/23/2019), Disp: 30 tablet, Rfl: 3 .  FEROSUL 325 (65 Fe) MG tablet, TAKE 1 TABLET TWICE DAILY, Disp: 180 tablet, Rfl: 0  Social History   Tobacco Use  Smoking Status Never Smoker  Smokeless Tobacco Never Used    No Known Allergies Objective:  There were no vitals filed for this visit. There is no height or weight on file to calculate BMI. Constitutional Well developed. Well nourished.  Vascular Dorsalis pedis pulses palpable bilaterally.  Posterior tibial pulses palpable bilaterally. Capillary refill normal to all digits.  No cyanosis or clubbing noted. Pedal hair growth normal.  Neurologic Normal speech. Oriented to person, place, and time. Epicritic sensation to light touch grossly present bilaterally.  Dermatologic Nails well groomed and normal in appearance. No open wounds. No skin lesions.  Orthopedic:  Pain to the plantar aspect the left first metatarsal phalangeal joint.  Left hallux purchase with prominent hallux extensor tendon   Radiographs: Taken and reviewed hallux extensus deformity no acute fracture Assessment:   1. Hallux extensus, acquired, left   2. Contracture of tendon sheath    Plan:  Patient was evaluated and treated and all questions answered.  Hallux Extensus -XR reviewed -Educated on etiology of the deformity.  Discussed the patient that should pain persist would consider possible extensor tenotomy versus extensor tendon lengthening versus first metatarsophalangeal joint fusion.  Patient like to think this over follow-up in 6 weeks for further discussion   Return in about 6 weeks (around 08/04/2019) for Hallux extensus f/u .

## 2019-07-20 ENCOUNTER — Ambulatory Visit (INDEPENDENT_AMBULATORY_CARE_PROVIDER_SITE_OTHER): Payer: Medicare HMO | Admitting: Internal Medicine

## 2019-08-11 ENCOUNTER — Ambulatory Visit: Payer: Medicare HMO | Admitting: Podiatry

## 2019-08-17 ENCOUNTER — Other Ambulatory Visit: Payer: Self-pay

## 2019-08-17 ENCOUNTER — Encounter: Payer: Self-pay | Admitting: Gastroenterology

## 2019-08-17 ENCOUNTER — Encounter: Payer: Self-pay | Admitting: Nurse Practitioner

## 2019-08-17 ENCOUNTER — Ambulatory Visit (INDEPENDENT_AMBULATORY_CARE_PROVIDER_SITE_OTHER): Payer: Medicare HMO | Admitting: Nurse Practitioner

## 2019-08-17 DIAGNOSIS — D500 Iron deficiency anemia secondary to blood loss (chronic): Secondary | ICD-10-CM | POA: Diagnosis not present

## 2019-08-17 NOTE — Patient Instructions (Signed)
Your health issues we discussed today were:   Anemia: 1. Have your labs checked when you are able to 2. We will call you with the results when we receive them 3. Call us if you have any obvious bleeding in your stools 4. Continue your iron for now until you hear otherwise from [place]  Overall I recommend:  1. Continue your other current medications 2. Return for follow-up in 6 months 3. Call us if you have any questions or concerns.   Because of recent events of COVID-19 ("Coronavirus"), follow CDC recommendations:  1. Wash your hand frequently 2. Avoid touching your face 3. Stay away from people who are sick 4. If you have symptoms such as fever, cough, shortness of breath then call your healthcare provider for further guidance 5. If you are sick, STAY AT HOME unless otherwise directed by your healthcare provider. 6. Follow directions from state and national officials regarding staying safe   At [facility] we value your feedback. You may receive a survey about your visit today. Please share your experience as we strive to create trusting relationships with our patients to provide genuine, compassionate, quality care.  We appreciate your understanding and patience as we review any laboratory studies, imaging, and other diagnostic tests that are ordered as we care for you. Our office policy is 5 business days for review of these results, and any emergent or urgent results are addressed in a timely manner for your best interest. If you do not hear from our office in 1 week, please contact us.   We also encourage the use of MyChart, which contains your medical information for your review as well. If you are not enrolled in this feature, an access code is on this after visit summary for your convenience. Thank you for allowing [place] to be involved in your care.  It was great to see you today!  I hope you have a Happy Thanksgiving!!

## 2019-08-17 NOTE — Progress Notes (Signed)
Referring Provider: Doree Albee, MD Primary Care Physician:  Doree Albee, MD Primary GI:  Dr. Oneida Alar  NOTE: Service was provided via telemedicine and was requested by the patient due to COVID-19 pandemic.  Method of visit: Telephone  Patient Location: Home  Provider Location: Office  Reason for Phone Visit: Follow-up  The patient was consented to phone follow-up via telephone encounter including billing of the encounter (yes/no): Yes  Persons present on the phone encounter, with roles: N/A  Total time (minutes) spent on medical discussion: 22 minutes  Chief Complaint  Patient presents with   Anemia    HPI:   Scott Ritter is a 58 y.o. male who presents for virtual visit regarding: Follow-up on anemia.  Patient was last seen in our office 02/14/2019 for iron deficiency anemia and melena.  The patient's last visit was a virtual office visit due to COVID-19/coronavirus pandemic.  Noted hospitalization end of 2019 for acute blood loss anemia due to GI bleed likely from erosive gastritis secondary to NSAIDs that required 2 units of PRBCs.  Discharge hemoglobin 9.9.  Givens capsule was later completed and found AVM with no active bleeding likely in the second portion of the duodenum, a single nonbleeding erosion, questionable ulcer also nonbleeding in the small bowel.  Recommended avoid NSAIDs, Protonix 40 mg daily indefinitely, EGD/push enteroscopy for AVM ablation.  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  Surgical oncology follow biopsies to be duodenal mucosa without specific histopathological changes.  Recommended repeat CBC in January 2020, follow-up in 4 months.  Subsequently globin stabilized to the 11/12 range.  At his last visit he was doing well, no overt bleeding signs of symptomatic anemia such as fatigue or weakness.  Has blood work upcoming and  indicated he would bring [place] a copy.  Recommended notify us of any further bleeding, bring a copy of labs to our office, follow-up in 6 months.  Most recent CBC in our system dated 05/31/2019 found stable/improved hemoglobin 12.9, ferritin normalized at 42 (was 3 a year ago).  Today he states he's doing ok overall. Denies hematochezia, melena, other bleeding. Denies abdominal pain, N/V, fever, chills, unintentional weight loss. Not seeing a hematologist. Denies weakness, fatigue, chest pain, dyspnea. Appetite is good. Still on bid iron. No overt constipation on iron. BM every morning, soft and passes easily consistent with [place] 4. Denies URI or flu-like symptoms. Denies loss of sense of taste or smell. Denies chest pain, dyspnea, dizziness, lightheadedness, syncope, near syncope. Denies any other upper or lower GI symptoms.  Past Medical History:  Diagnosis Date   Hypertension    Stroke [facility])     Past Surgical History:  Procedure Laterality Date   BACK SURGERY     BIOPSY  09/17/2018   Procedure: BIOPSY;  Surgeon: Danie Binder, MD;  [facility];  Service: Endoscopy;;  duodenal biopsy    COLONOSCOPY N/A 02/29/2016   Dr. Oneida Alar: redundant colon, non-bleeding internal hemorrhoids    ESOPHAGOGASTRODUODENOSCOPY N/A 12/17/2016   Dr. Oneida Alar: 3 mm nonbleeding Mallory-Weiss tear.  EGD performed for hematemesis.  Hemoglobin normal.   ESOPHAGOGASTRODUODENOSCOPY N/A 02/19/2018   Dr. Gala Romney: Performed for melena, hemoglobin of 6.  Mucosal changes in the esophagus query short segment Barrett's, biopsy more consistent with reflux changes.  Erosive gastropathy but no H. pylori.  Duodenal erosions.  Suspected NSAID induced injury.   ESOPHAGOGASTRODUODENOSCOPY  08/18/2018  Dr. Karilyn Cota: IDA/heme positive stool.  Esophageal mucosal changes consistent with short segment Barrett's esophagus, not biopsied.  2 cm hiatal hernia.  Duodenal bulb, second portion of duodenum, third portion of the  duodenum.  Video capsule somewhat difficult to pass through the oropharynx but was eventually advanced into the second portion of the duodenum and released.   ESOPHAGOGASTRODUODENOSCOPY N/A 08/18/2018   Procedure: ESOPHAGOGASTRODUODENOSCOPY (EGD);  Surgeon: Malissa Hippo, MD;  [facility];  Service: Endoscopy;  Laterality: N/A;   ESOPHAGOGASTRODUODENOSCOPY N/A 09/17/2018   Procedure: ENTEROSCOPY;  Surgeon: West Bali, MD;  [facility];  Service: Endoscopy;  Laterality: N/A;   GIVENS CAPSULE STUDY N/A 08/18/2018   Procedure: GIVENS CAPSULE STUDY;  Surgeon: Malissa Hippo, MD;  [facility];  Service: Endoscopy;  Laterality: N/A;   HERNIA REPAIR      Current Outpatient Medications  Medication Sig Dispense Refill   atorvastatin (LIPITOR) 20 MG tablet Take 20 mg by mouth every evening.      Cholecalciferol (VITAMIN D) 2000 units CAPS Take 2,000 Units by mouth daily.     FEROSUL 325 (65 Fe) MG tablet TAKE 1 TABLET TWICE DAILY 180 tablet 0   hydrALAZINE (APRESOLINE) 100 MG tablet Take 1 tablet (100 mg total) by mouth 3 (three) times daily. 90 tablet 3   losartan-hydrochlorothiazide (HYZAAR) 100-12.5 MG tablet Take 1 tablet by mouth daily.     metoprolol tartrate (LOPRESSOR) 25 MG tablet Take 25 mg by mouth 2 (two) times daily.      potassium chloride SA (K-DUR,KLOR-CON) 20 MEQ tablet Take 1 tablet (20 mEq total) by mouth daily. (Patient taking differently: Take 20 mEq by mouth 2 (two) times daily. ) 30 tablet 0   aspirin EC 81 MG EC tablet Take 1 tablet (81 mg total) by mouth daily. (Patient not taking: Reported on 06/23/2019) 30 tablet 3   hydrALAZINE (APRESOLINE) 50 MG tablet      No current facility-administered medications for this visit.     Allergies as of 08/17/2019   (No Known Allergies)    Family History  Problem Relation Age of Onset   Hypertension Father    Colon cancer Neg Hx    Colon polyps Neg Hx     Social History     Socioeconomic History   Marital status: Married    Spouse name: Not on file   Number of children: Not on file   Years of education: Not on file   Highest education level: Not on file  Occupational History   Not on file  Social Needs   Financial resource strain: Not on file   Food insecurity    Worry: Not on file    Inability: Not on file   Transportation needs    Medical: Not on file    Non-medical: Not on file  Tobacco Use   Smoking status: Never Smoker   Smokeless tobacco: Never Used  Substance and Sexual Activity   Alcohol use: Yes    Comment: occasionally   Drug use: No   Sexual activity: Not on file  Lifestyle   Physical activity    Days per week: Not on file    Minutes per session: Not on file   Stress: Not on file  Relationships   Social connections    Talks on phone: Not on file    Gets together: Not on file    Attends religious service: Not on file    Active member of club  or organization: Not on file    Attends meetings of clubs or organizations: Not on file    Relationship status: Not on file  Other Topics Concern   Not on file  Social History Narrative   Not on file    Review of Systems: General: Negative for anorexia, weight loss, fever, chills, fatigue, weakness. ENT: Negative for hoarseness, difficulty swallowing. CV: Negative for chest pain, angina, palpitations, peripheral edema.  Respiratory: Negative for dyspnea at rest, cough, sputum, wheezing.  GI: See history of present illness. Endo: Negative for unusual weight change.  Heme: Negative for bruising or bleeding. Allergy: Negative for rash or hives.  Physical Exam: Note: limited exam due to virtual visit General:   Alert and oriented. Pleasant and cooperative. Ears:  Normal auditory acuity. Neurologic:  Alert and oriented x4 Psych:  Alert and cooperative. Normal mood and affect.

## 2019-08-17 NOTE — Assessment & Plan Note (Signed)
History of chronic iron deficiency anemia.  He denies any obvious or overt GI bleed.  Previously underwent EGD/push enteroscopy with APC treatment of 2 nonbleeding angiectasia is in the duodenum.  He has not had his labs checked in about 3 months.  His last CBC was improved, ferritin stable as per HPI.  No other overt GI symptoms.  At this point we will recheck his CBC and iron studies.  Follow-up in 6 months pending lab results.

## 2019-08-23 DIAGNOSIS — D500 Iron deficiency anemia secondary to blood loss (chronic): Secondary | ICD-10-CM | POA: Diagnosis not present

## 2019-08-23 LAB — CBC WITH DIFFERENTIAL/PLATELET
Absolute Monocytes: 721 cells/uL (ref 200–950)
Basophils Absolute: 71 cells/uL (ref 0–200)
Basophils Relative: 0.8 %
Eosinophils Absolute: 187 cells/uL (ref 15–500)
Eosinophils Relative: 2.1 %
HCT: 42.6 % (ref 38.5–50.0)
Hemoglobin: 13.7 g/dL (ref 13.2–17.1)
Lymphs Abs: 1584 cells/uL (ref 850–3900)
MCH: 27.8 pg (ref 27.0–33.0)
MCHC: 32.2 g/dL (ref 32.0–36.0)
MCV: 86.6 fL (ref 80.0–100.0)
MPV: 11.1 fL (ref 7.5–12.5)
Monocytes Relative: 8.1 %
Neutro Abs: 6337 cells/uL (ref 1500–7800)
Neutrophils Relative %: 71.2 %
Platelets: 279 10*3/uL (ref 140–400)
RBC: 4.92 10*6/uL (ref 4.20–5.80)
RDW: 14.3 % (ref 11.0–15.0)
Total Lymphocyte: 17.8 %
WBC: 8.9 10*3/uL (ref 3.8–10.8)

## 2019-08-23 LAB — FERRITIN: Ferritin: 44 ng/mL (ref 38–380)

## 2019-08-23 LAB — IRON: Iron: 115 ug/dL (ref 50–180)

## 2019-08-24 ENCOUNTER — Encounter (INDEPENDENT_AMBULATORY_CARE_PROVIDER_SITE_OTHER): Payer: Self-pay | Admitting: Internal Medicine

## 2019-08-24 ENCOUNTER — Ambulatory Visit (INDEPENDENT_AMBULATORY_CARE_PROVIDER_SITE_OTHER): Payer: Medicare HMO | Admitting: Internal Medicine

## 2019-08-24 ENCOUNTER — Other Ambulatory Visit: Payer: Self-pay

## 2019-08-24 VITALS — BP 200/100 | HR 72 | Ht 67.0 in | Wt 217.0 lb

## 2019-08-24 DIAGNOSIS — I10 Essential (primary) hypertension: Secondary | ICD-10-CM | POA: Diagnosis not present

## 2019-08-24 DIAGNOSIS — I639 Cerebral infarction, unspecified: Secondary | ICD-10-CM

## 2019-08-24 DIAGNOSIS — M79673 Pain in unspecified foot: Secondary | ICD-10-CM

## 2019-08-24 DIAGNOSIS — E876 Hypokalemia: Secondary | ICD-10-CM

## 2019-08-24 DIAGNOSIS — Z23 Encounter for immunization: Secondary | ICD-10-CM

## 2019-08-24 MED ORDER — TRAMADOL HCL 50 MG PO TABS: 50.0000 mg | ORAL_TABLET | Freq: Three times a day (TID) | ORAL | 0 refills | Status: AC | PRN

## 2019-08-24 MED ORDER — AMLODIPINE BESYLATE 5 MG PO TABS: 5.0000 mg | ORAL_TABLET | Freq: Every day | ORAL | 1 refills | Status: DC

## 2019-08-24 MED ORDER — AMLODIPINE BESYLATE 5 MG PO TABS: 5.0000 mg | ORAL_TABLET | Freq: Every day | ORAL | 0 refills | Status: DC

## 2019-08-24 NOTE — Progress Notes (Signed)
Metrics: Intervention Frequency ACO  Documented Smoking Status Yearly  Screened one or more times in 24 months  Cessation Counseling or  Active cessation medication Past 24 months  Past 24 months   Guideline developer: UpToDate (See UpToDate for funding source) Date Released: 2014       Wellness Office Visit  Subjective:  Patient ID: Scott Ritter, male    DOB: 11/09/1960  Age: 58 y.o. MRN: [id number]  CC: This man comes in for follow-up of hypertension and previous history of stroke.  He was also being followed for anemia. HPI  He had blood work done yesterday which now shows a normal hemoglobin with normal ferritin and iron levels. He continues to take antihypertensive medication as before.  On the last visit when I had seen him in August, I increased his antihypertensive medication to include metoprolol 25 mg twice a day which she has been compliant with. He denies any chest pain, dyspnea, headache, new limb weakness. He is complaining of severe left foot pain and he is due to see the podiatrist on Friday. Past Medical History:  Diagnosis Date  . Hypertension   . Stroke [facility])       Family History  Problem Relation Age of Onset  . Hypertension Father   . Colon cancer Neg Hx   . Colon polyps Neg Hx     Social History   Social History Narrative  . Not on file   Social History   Tobacco Use  . Smoking status: Never Smoker  . Smokeless tobacco: Never Used  Substance Use Topics  . Alcohol use: Yes    Comment: occasionally    Current Meds  Medication Sig  . atorvastatin (LIPITOR) 20 MG tablet Take 20 mg by mouth every evening.   . Cholecalciferol (VITAMIN D) 2000 units CAPS Take 8,000 Units by mouth daily.   . FEROSUL 325 (65 Fe) MG tablet TAKE 1 TABLET TWICE DAILY  . hydrALAZINE (APRESOLINE) 100 MG tablet Take 1 tablet (100 mg total) by mouth 3 (three) times daily.  Marland Kitchen losartan-hydrochlorothiazide (HYZAAR) 100-12.5 MG tablet Take 1 tablet by mouth daily.  .  metoprolol tartrate (LOPRESSOR) 25 MG tablet Take 25 mg by mouth 2 (two) times daily.   . potassium chloride SA (K-DUR,KLOR-CON) 20 MEQ tablet Take 1 tablet (20 mEq total) by mouth daily. (Patient taking differently: Take 20 mEq by mouth 2 (two) times daily. )      Objective:   Today's Vitals: BP (!) 200/100   Pulse 72   Ht _0  (1.702 m)   Wt 217 lb (98.4 kg)   BMI 33.99 kg/m  Vitals with BMI 08/24/2019 08/17/2019 01/26/2019  Height _1  - -  Weight 217 lbs - -  BMI 95.28 - -  Systolic 413 (No Data) 244  Diastolic 010 (No Data) 95  Pulse 72 - 73     Physical Exam   He is alert and orientated.  There are no new focal neurological signs.  His blood pressure is significantly elevated.    Assessment   1. Essential hypertension   2. Cerebrovascular accident (CVA), unspecified mechanism ([place])   3. Hypokalemia   4. Acute foot pain, unspecified laterality       Tests ordered Orders Placed This Encounter  Procedures  . CMP with eGFR([facility])     Plan: 1. I am concerned about his uncontrolled hypertension and I will add amlodipine 5 mg daily to his regimen.  He will continue to take the rest  of his medications as before. 2. For the time being, I will prescribe him tramadol for his foot pain but he will be seeing the podiatrist soon.  I told him to use this very judiciously. 3. He will receive influenza vaccination today. 4. I will see him in about a month's time for close follow-up in regard to his hypertension.  Blood work is ordered as above.   Meds ordered this encounter  Medications  . amLODipine (NORVASC) 5 MG tablet    Sig: Take 1 tablet (5 mg total) by mouth daily.    Dispense:  30 tablet    Refill:  0  . amLODipine (NORVASC) 5 MG tablet    Sig: Take 1 tablet (5 mg total) by mouth daily.    Dispense:  90 tablet    Refill:  1  . traMADol (ULTRAM) 50 MG tablet    Sig: Take 1 tablet (50 mg total) by mouth every 8 (eight) hours as needed for up to 10 days.     Dispense:  30 tablet    Refill:  0    Nimish Luther Parody, MD

## 2019-08-25 ENCOUNTER — Other Ambulatory Visit (INDEPENDENT_AMBULATORY_CARE_PROVIDER_SITE_OTHER): Payer: Self-pay | Admitting: Internal Medicine

## 2019-08-25 LAB — COMPLETE METABOLIC PANEL WITH GFR
AG Ratio: 1.4 (calc) (ref 1.0–2.5)
ALT: 9 U/L (ref 9–46)
AST: 15 U/L (ref 10–35)
Albumin: 4.2 g/dL (ref 3.6–5.1)
Alkaline phosphatase (APISO): 73 U/L (ref 35–144)
BUN/Creatinine Ratio: 15 (calc) (ref 6–22)
BUN: 21 mg/dL (ref 7–25)
CO2: 25 mmol/L (ref 20–32)
Calcium: 9.2 mg/dL (ref 8.6–10.3)
Chloride: 105 mmol/L (ref 98–110)
Creat: 1.37 mg/dL — ABNORMAL HIGH (ref 0.70–1.33)
GFR, Est African American: 66 mL/min/{1.73_m2} (ref >=60)
GFR, Est Non African American: 57 mL/min/{1.73_m2} — ABNORMAL LOW (ref >=60)
Globulin: 2.9 g/dL (calc) (ref 1.9–3.7)
Glucose, Bld: 109 mg/dL — ABNORMAL HIGH (ref 65–99)
Potassium: 3.4 mmol/L — ABNORMAL LOW (ref 3.5–5.3)
Sodium: 137 mmol/L (ref 135–146)
Total Bilirubin: 0.6 mg/dL (ref 0.2–1.2)
Total Protein: 7.1 g/dL (ref 6.1–8.1)

## 2019-08-25 NOTE — Progress Notes (Signed)
Pt was called given instructions to INCREASE waqter intake daily. PT is to also take potassium 20 mEq ;1 tablet twice a day. Pt said he was taking potassium tice daily already. He will continue & increase drinking  Water.

## 2019-08-26 ENCOUNTER — Other Ambulatory Visit: Payer: Self-pay

## 2019-08-26 ENCOUNTER — Ambulatory Visit: Payer: Medicare HMO | Admitting: Podiatry

## 2019-08-26 DIAGNOSIS — M205X2 Other deformities of toe(s) (acquired), left foot: Secondary | ICD-10-CM | POA: Diagnosis not present

## 2019-08-26 DIAGNOSIS — M6240 Contracture of muscle, unspecified site: Secondary | ICD-10-CM

## 2019-08-29 ENCOUNTER — Telehealth: Payer: Self-pay | Admitting: *Deleted

## 2019-08-29 NOTE — Telephone Encounter (Signed)
DOS 09/02/2019 HALLUX EXTENSUS LEFT FOOT- 00349 OR 17915  [employer] MEDICARE: Eligibility Date - Oct 13, 2017    [employer] [place] ([employer]) - Health Benefit Plan Coverage  In [place] [employer] (HMO) - Medicare Risk  $3,400.00 Calendar Year  - $340.00 Year to Date  $3,060.00 Remaining   [place] Maintenance Organization (HMO) - Medicare Risk  SURGERY  0 % Calendar Year    Co-Payment - Professional (Physician) Visit - Office  In [place] Maintenance Organization (HMO) - Medicare Risk  PHYSICIAN'S OFFICE VISIT SPECIALIST  $05.69 Visit    Certificate Information  Certification Number [id number] Status CERTIFIED IN TOTAL Message Authorization is based on information provided; it is not a guarantee of payment. Billed services are subject to medical necessity, appropriate setting, billing/coding, plan limits, eligibility at time of service. Verify benefits online or call Customer Service.

## 2019-09-02 ENCOUNTER — Encounter: Payer: Self-pay | Admitting: Podiatry

## 2019-09-02 ENCOUNTER — Ambulatory Visit (INDEPENDENT_AMBULATORY_CARE_PROVIDER_SITE_OTHER): Payer: Medicare HMO | Admitting: Podiatry

## 2019-09-02 ENCOUNTER — Other Ambulatory Visit: Payer: Self-pay

## 2019-09-02 DIAGNOSIS — M205X2 Other deformities of toe(s) (acquired), left foot: Secondary | ICD-10-CM | POA: Diagnosis not present

## 2019-09-02 MED ORDER — HYDROCODONE-ACETAMINOPHEN 5-325 MG PO TABS: 1.0000 | ORAL_TABLET | Freq: Four times a day (QID) | ORAL | 0 refills | Status: DC | PRN

## 2019-09-02 MED ORDER — CEPHALEXIN 500 MG PO CAPS: 500.0000 mg | ORAL_CAPSULE | Freq: Two times a day (BID) | ORAL | 0 refills | Status: DC

## 2019-09-02 NOTE — Progress Notes (Signed)
  Subjective:  Patient ID: Scott Ritter, male    DOB: 08/06/1961,  MRN: [id number]  No chief complaint on file.  58 y.o. male returns today for planned extensor tenotomy of the left great digit.  Objective:  There were no vitals filed for this visit.  General AA&O x3. Normal mood and affect.  Vascular Pedal pulses palpable.  Neurologic Epicritic sensation grossly intact.  Dermatologic No open lesions.  Orthopedic: Semi-reducible hallux extensus deformity left, hallux    Assessment & Plan:  Patient was evaluated and treated and all questions answered.  Hallux extensus left -Extensor tenotomies as below. -Advised to remove the dressing in 1 week and apply a band-aid and triple abx ointment every day thereafter -Rx keflex for post-op ppx -Rx norco for pain -Surgical shoe dispensed.  Procedure: Extensor Tenotomy Indication for Procedure: toe with semi-reducible hallux extensor  tenotomy indicated to alleviate contracture  Location: left, hallux Anesthesia: Lidocaine 1% plain; 1.5 mL and Marcaine 0.5% plain; 1.5 mL digital block Instrumentation: 18 g needle  Technique: The toe was anesthetized as above and prepped in the usual fashion. The extensor tendons were identified. The brevis tendon was identified first, the skin was punctured with an 11 blade and the tendon was released with an 11 blade.There was still tendon contracture from the longus tendon. The tendon was identified, and similarly released percutaneously. The contracture released. The percutaneous incisions were dressed with abx ointment and band-aids. Compression dressing was applied.  Dressing: Dry, sterile, compression dressing. Disposition: Patient tolerated procedure well. Patient to return in 1 week for follow-up.      No follow-ups on file.

## 2019-09-16 ENCOUNTER — Ambulatory Visit (INDEPENDENT_AMBULATORY_CARE_PROVIDER_SITE_OTHER): Payer: Medicare HMO | Admitting: Podiatry

## 2019-09-16 ENCOUNTER — Other Ambulatory Visit: Payer: Self-pay

## 2019-09-16 ENCOUNTER — Ambulatory Visit (INDEPENDENT_AMBULATORY_CARE_PROVIDER_SITE_OTHER): Payer: Medicare HMO

## 2019-09-16 DIAGNOSIS — M205X2 Other deformities of toe(s) (acquired), left foot: Secondary | ICD-10-CM

## 2019-09-23 ENCOUNTER — Other Ambulatory Visit (INDEPENDENT_AMBULATORY_CARE_PROVIDER_SITE_OTHER): Payer: Self-pay | Admitting: Internal Medicine

## 2019-09-26 ENCOUNTER — Ambulatory Visit (INDEPENDENT_AMBULATORY_CARE_PROVIDER_SITE_OTHER): Payer: Medicare HMO | Admitting: Internal Medicine

## 2019-09-29 ENCOUNTER — Other Ambulatory Visit: Payer: Self-pay

## 2019-09-29 ENCOUNTER — Ambulatory Visit (INDEPENDENT_AMBULATORY_CARE_PROVIDER_SITE_OTHER): Payer: Medicare HMO | Admitting: Podiatry

## 2019-09-29 DIAGNOSIS — M205X2 Other deformities of toe(s) (acquired), left foot: Secondary | ICD-10-CM

## 2019-09-29 DIAGNOSIS — M6240 Contracture of muscle, unspecified site: Secondary | ICD-10-CM

## 2019-09-29 NOTE — Progress Notes (Addendum)
  Subjective:  Patient ID: Scott Ritter, male    DOB: 1960-11-20,  MRN: [id number]  No chief complaint on file.  DOS: 09/02/2019 Procedure: Left Hallux extensor tenotomy  58 y.o. male returns for post-op check. Doing well pleased with surgical outcome thus far denies pain or discomfort.  Review of Systems: Negative except as noted in the HPI. Denies N/V/F/Ch.  Past Medical History:  Diagnosis Date  . Hypertension   . Stroke [facility])     Current Outpatient Medications:  .  amLODipine (NORVASC) 5 MG tablet, Take 1 tablet (5 mg total) by mouth daily., Disp: 30 tablet, Rfl: 0 .  amLODipine (NORVASC) 5 MG tablet, Take 1 tablet (5 mg total) by mouth daily., Disp: 90 tablet, Rfl: 1 .  atorvastatin (LIPITOR) 20 MG tablet, Take 20 mg by mouth every evening. , Disp: , Rfl:  .  cephALEXin (KEFLEX) 500 MG capsule, Take 1 capsule (500 mg total) by mouth 2 (two) times daily., Disp: 14 capsule, Rfl: 0 .  Cholecalciferol (VITAMIN D) 2000 units CAPS, Take 8,000 Units by mouth daily. , Disp: , Rfl:  .  FEROSUL 325 (65 Fe) MG tablet, TAKE 1 TABLET TWICE DAILY, Disp: 180 tablet, Rfl: 0 .  hydrALAZINE (APRESOLINE) 100 MG tablet, Take 1 tablet (100 mg total) by mouth 3 (three) times daily., Disp: 90 tablet, Rfl: 3 .  HYDROcodone-acetaminophen (NORCO) 5-325 MG tablet, Take 1 tablet by mouth every 6 (six) hours as needed for moderate pain., Disp: 30 tablet, Rfl: 0 .  losartan-hydrochlorothiazide (HYZAAR) 100-12.5 MG tablet, Take 1 tablet by mouth daily., Disp: , Rfl:  .  metoprolol tartrate (LOPRESSOR) 25 MG tablet, Take 25 mg by mouth 2 (two) times daily. , Disp: , Rfl:  .  potassium chloride SA (KLOR-CON) 20 MEQ tablet, TAKE 1 TABLET TWICE DAILY, Disp: 180 tablet, Rfl: 1  Social History   Tobacco Use  Smoking Status Never Smoker  Smokeless Tobacco Never Used    No Known Allergies Objective:  There were no vitals filed for this visit. There is no height or weight on file to calculate  BMI. Constitutional Well developed. Well nourished.  Vascular Foot warm and well perfused. Capillary refill normal to all digits.   Neurologic Normal speech. Oriented to person, place, and time. Epicritic sensation to light touch grossly present bilaterally.  Dermatologic Skin healing well without signs of infection.  Orthopedic: No tenderness to palpation noted about the surgical site. Left hallux laying flat no longer in extensus.   Radiographs: None Assessment:   1. Hallux extensus, acquired, left   2. Contracture of tendon sheath    Plan:  Patient was evaluated and treated and all questions answered.  S/p foot surgery left -Progressing as expected post-operatively. -XR: none -WB Status: WBAT in normal shoegear. -Sutures: none. -Medications: none refilled. -Foot redressed.  No follow-ups on file.

## 2019-09-29 NOTE — Progress Notes (Signed)
  Subjective:  Patient ID: Scott Ritter, male    DOB: [birth year],  MRN: [id number]  Chief Complaint  Patient presents with  . Post-op Follow-up    hallux, extension - left  , pov #1 , pt declines of any pain, fever, chills,    DOS: 09/02/2019 Procedure: Left Hallux extensor tenotomy  58 y.o. male returns for post-op check. Doing very well.  Review of Systems: Negative except as noted in the HPI. Denies N/V/F/Ch.  Past Medical History:  Diagnosis Date  . Hypertension   . Stroke [facility])     Current Outpatient Medications:  .  amLODipine (NORVASC) 5 MG tablet, Take 1 tablet (5 mg total) by mouth daily., Disp: 30 tablet, Rfl: 0 .  amLODipine (NORVASC) 5 MG tablet, Take 1 tablet (5 mg total) by mouth daily., Disp: 90 tablet, Rfl: 1 .  atorvastatin (LIPITOR) 20 MG tablet, Take 20 mg by mouth every evening. , Disp: , Rfl:  .  cephALEXin (KEFLEX) 500 MG capsule, Take 1 capsule (500 mg total) by mouth 2 (two) times daily., Disp: 14 capsule, Rfl: 0 .  Cholecalciferol (VITAMIN D) 2000 units CAPS, Take 8,000 Units by mouth daily. , Disp: , Rfl:  .  hydrALAZINE (APRESOLINE) 100 MG tablet, Take 1 tablet (100 mg total) by mouth 3 (three) times daily., Disp: 90 tablet, Rfl: 3 .  HYDROcodone-acetaminophen (NORCO) 5-325 MG tablet, Take 1 tablet by mouth every 6 (six) hours as needed for moderate pain., Disp: 30 tablet, Rfl: 0 .  losartan-hydrochlorothiazide (HYZAAR) 100-12.5 MG tablet, Take 1 tablet by mouth daily., Disp: , Rfl:  .  metoprolol tartrate (LOPRESSOR) 25 MG tablet, Take 25 mg by mouth 2 (two) times daily. , Disp: , Rfl:  .  potassium chloride SA (KLOR-CON) 20 MEQ tablet, TAKE 1 TABLET TWICE DAILY, Disp: 180 tablet, Rfl: 1 .  FEROSUL 325 (65 Fe) MG tablet, TAKE 1 TABLET TWICE DAILY, Disp: 180 tablet, Rfl: 0  Social History   Tobacco Use  Smoking Status Never Smoker  Smokeless Tobacco Never Used    No Known Allergies Objective:  There were no vitals filed for this visit. There  is no height or weight on file to calculate BMI. Constitutional Well developed. Well nourished.  Vascular Foot warm and well perfused. Capillary refill normal to all digits.   Neurologic Normal speech. Oriented to person, place, and time. Epicritic sensation to light touch grossly present bilaterally.  Dermatologic Skin healing well without signs of infection. Skin edges well coapted without signs of infection.  Orthopedic: Tenderness to palpation noted about the surgical site.   Radiographs: None Assessment:   1. Hallux extensus, acquired, left    Plan:  Patient was evaluated and treated and all questions answered.  S/p foot surgery left -Progressing as expected post-operatively. -XR: None2 -WB Status: WBAT in surgical shoe -Sutures: none. -Medications: none -Foot redressed.  No follow-ups on file.

## 2019-09-30 ENCOUNTER — Encounter: Payer: Medicare HMO | Admitting: Podiatry

## 2019-10-20 ENCOUNTER — Encounter (INDEPENDENT_AMBULATORY_CARE_PROVIDER_SITE_OTHER): Payer: Self-pay | Admitting: Nurse Practitioner

## 2019-10-20 ENCOUNTER — Ambulatory Visit (INDEPENDENT_AMBULATORY_CARE_PROVIDER_SITE_OTHER): Payer: Medicare HMO | Admitting: Nurse Practitioner

## 2019-10-20 VITALS — BP 200/100 | Ht 67.0 in | Wt 214.0 lb

## 2019-10-20 DIAGNOSIS — Z0000 Encounter for general adult medical examination without abnormal findings: Secondary | ICD-10-CM | POA: Diagnosis not present

## 2019-10-20 DIAGNOSIS — R351 Nocturia: Secondary | ICD-10-CM | POA: Diagnosis not present

## 2019-10-20 DIAGNOSIS — R220 Localized swelling, mass and lump, head: Secondary | ICD-10-CM

## 2019-10-20 DIAGNOSIS — H538 Other visual disturbances: Secondary | ICD-10-CM

## 2019-10-20 DIAGNOSIS — Z1159 Encounter for screening for other viral diseases: Secondary | ICD-10-CM | POA: Diagnosis not present

## 2019-10-20 NOTE — Progress Notes (Signed)
Due to national recommendations of social distancing related to the COVID19 pandemic, an audio/visual tele-health visit was felt to be the most appropriate encounter type for this patient today. I connected with  Scott Ritter on 10/20/19 utilizing audio/visual technology and verified that I am speaking with the correct person using two identifiers. The patient was located at their home, and I was located at the office of [facility] during the encounter. I discussed the limitations of evaluation and management by telemedicine. The patient expressed understanding and agreed to proceed.      Subjective:   Scott Ritter is a 59 y.o. male who presents for Medicare Annual/Subsequent preventive examination.  I also note that his blood pressure at last office visit was very elevated.  He was started on metoprolol twice a day.  He does tell me today that he has been feeling better since taking the metoprolol.  I asked if he is checking his blood pressure at home and he tells me that he does check it sometimes, but is unable to give me any specific readings.  He does tell me he feels like his blood pressure is much better.  He denies any chest pain, difficulty breathing, headache, new blurred vision, or feeling lightheaded/dizzy.  The patient does mention that he would like to be seen by an eye doctor to see if he needs eyeglasses.  He also mentions that he is having some problems with his teeth and gums, and would like to be seen by dentist.  He is not sure what his insurance covers regarding both eyeglasses and dentistry.   Cardiac Risk Factors include: advanced age (>40men, >75 women);male gender;obesity (BMI >30kg/m2)     Objective:    Vitals: BP (!) 200/100 Comment (BP Location): Last BP here at the office  Ht 5\' 7"  (1.702 m)   Wt 214 lb (97.1 kg)   BMI 33.52 kg/m   Body mass index is 33.52 kg/m.  Advanced Directives 10/20/2019 01/24/2019 01/24/2019 09/17/2018 08/17/2018  02/19/2018 02/18/2018  Does Patient Have a Medical Advance Directive? No No No No No No No  Would patient like information on creating a medical advance directive? Yes (MAU/Ambulatory/Procedural Areas - Information given) No - Patient declined No - Patient declined No - Patient declined No - Patient declined - No - Patient declined    Tobacco Social History   Tobacco Use  Smoking Status Never Smoker  Smokeless Tobacco Never Used     Counseling given: Not Answered   Clinical Intake:  Pre-visit preparation completed: Yes  Pain : No/denies pain     Nutritional Status: BMI > 30  Obese Diabetes: No  How often do you need to have someone help you when you read instructions, pamphlets, or other written materials from your doctor or pharmacy?: 1 - Never What is the last grade level you completed in school?: Some college  Interpreter Needed?: No  Information entered by :: [email or web address], NP-C  Past Medical History:  Diagnosis Date  . Hypertension   . Stroke [facility])    Past Surgical History:  Procedure Laterality Date  . BACK SURGERY    . BIOPSY  09/17/2018   Procedure: BIOPSY;  Surgeon: 14/03/2018, MD;  [facility];  Service: Endoscopy;;  duodenal biopsy   . COLONOSCOPY N/A 02/29/2016   Dr. 03/02/2016: redundant colon, non-bleeding internal hemorrhoids   . ESOPHAGOGASTRODUODENOSCOPY N/A 12/17/2016   Dr. 02/16/2017: 3 mm nonbleeding Mallory-Weiss tear.  EGD performed for  hematemesis.  Hemoglobin normal.  . ESOPHAGOGASTRODUODENOSCOPY N/A 02/19/2018   Dr. Jena Gauss: Performed for melena, hemoglobin of 6.  Mucosal changes in the esophagus query short segment Barrett's, biopsy more consistent with reflux changes.  Erosive gastropathy but no H. pylori.  Duodenal erosions.  Suspected NSAID induced injury.  . ESOPHAGOGASTRODUODENOSCOPY  08/18/2018   Dr. Karilyn Cota: IDA/heme positive stool.  Esophageal mucosal changes consistent with short segment Barrett's esophagus, not biopsied.  2 cm hiatal  hernia.  Duodenal bulb, second portion of duodenum, third portion of the duodenum.  Video capsule somewhat difficult to pass through the oropharynx but was eventually advanced into the second portion of the duodenum and released.  . ESOPHAGOGASTRODUODENOSCOPY N/A 08/18/2018   Procedure: ESOPHAGOGASTRODUODENOSCOPY (EGD);  Surgeon: Malissa Hippo, MD;  [facility];  Service: Endoscopy;  Laterality: N/A;  . ESOPHAGOGASTRODUODENOSCOPY N/A 09/17/2018   Procedure: ENTEROSCOPY;  Surgeon: West Bali, MD;  [facility];  Service: Endoscopy;  Laterality: N/A;  . GIVENS CAPSULE STUDY N/A 08/18/2018   Procedure: GIVENS CAPSULE STUDY;  Surgeon: Malissa Hippo, MD;  [facility];  Service: Endoscopy;  Laterality: N/A;  . HERNIA REPAIR     Family History  Problem Relation Age of Onset  . Hypertension Mother   . Hypertension Father   . Colon cancer Neg Hx   . Colon polyps Neg Hx    Social History   Socioeconomic History  . Marital status: Married    Spouse name: Not on file  . Number of children: Not on file  . Years of education: Not on file  . Highest education level: Not on file  Occupational History  . Not on file  Tobacco Use  . Smoking status: Never Smoker  . Smokeless tobacco: Never Used  Substance and Sexual Activity  . Alcohol use: Yes    Alcohol/week: 1.0 standard drinks    Types: 1 Cans of beer per week    Comment: occasionally; once a week or once a month  . Drug use: No  . Sexual activity: Not on file  Other Topics Concern  . Not on file  Social History Narrative  . Not on file   Social Determinants of Health   Financial Resource Strain:   . Difficulty of Paying Living Expenses: Not on file  Food Insecurity:   . Worried About Programme researcher, broadcasting/film/video in the Last Year: Not on file  . Ran Out of Food in the Last Year: Not on file  Transportation Needs:   . Lack of Transportation (Medical): Not on file  . Lack of Transportation  (Non-Medical): Not on file  Physical Activity:   . Days of Exercise per Week: Not on file  . Minutes of Exercise per Session: Not on file  Stress:   . Feeling of Stress : Not on file  Social Connections:   . Frequency of Communication with Friends and Family: Not on file  . Frequency of Social Gatherings with Friends and Family: Not on file  . Attends Religious Services: Not on file  . Active Member of Clubs or Organizations: Not on file  . Attends Banker Meetings: Not on file  . Marital Status: Not on file    Outpatient Encounter Medications as of 10/20/2019  Medication Sig  . amLODipine (NORVASC) 5 MG tablet Take 1 tablet (5 mg total) by mouth daily.  Marland Kitchen atorvastatin (LIPITOR) 20 MG tablet Take 20 mg by mouth every evening.   . Cholecalciferol (VITAMIN  D) 2000 units CAPS Take 8,000 Units by mouth daily.   . FEROSUL 325 (65 Fe) MG tablet TAKE 1 TABLET TWICE DAILY  . hydrALAZINE (APRESOLINE) 100 MG tablet Take 1 tablet (100 mg total) by mouth 3 (three) times daily.  Marland Kitchen HYDROcodone-acetaminophen (NORCO) 5-325 MG tablet Take 1 tablet by mouth every 6 (six) hours as needed for moderate pain.  Marland Kitchen losartan-hydrochlorothiazide (HYZAAR) 100-12.5 MG tablet Take 1 tablet by mouth daily.  . metoprolol tartrate (LOPRESSOR) 25 MG tablet Take 25 mg by mouth 2 (two) times daily.   . potassium chloride SA (KLOR-CON) 20 MEQ tablet TAKE 1 TABLET TWICE DAILY  . [DISCONTINUED] amLODipine (NORVASC) 5 MG tablet Take 1 tablet (5 mg total) by mouth daily.  . [DISCONTINUED] cephALEXin (KEFLEX) 500 MG capsule Take 1 capsule (500 mg total) by mouth 2 (two) times daily.  . [DISCONTINUED] FEROSUL 325 (65 Fe) MG tablet TAKE 1 TABLET TWICE DAILY  . [DISCONTINUED] ferrous sulfate 325 (65 FE) MG tablet Take 1 tablet (325 mg total) by mouth 2 (two) times daily with a meal.  . [DISCONTINUED] potassium chloride SA (K-DUR,KLOR-CON) 20 MEQ tablet Take 1 tablet (20 mEq total) by mouth daily. (Patient taking  differently: Take 20 mEq by mouth 2 (two) times daily. )   No facility-administered encounter medications on file as of 10/20/2019.    Activities of Daily Living In your present state of health, do you have any difficulty performing the following activities: 10/20/2019 01/24/2019  Hearing? N N  Vision? Y N  Difficulty concentrating or making decisions? N N  Walking or climbing stairs? N N  Dressing or bathing? N N  Doing errands, shopping? N N  Preparing Food and eating ? N -  Using the Toilet? N -  In the past six months, have you accidently leaked urine? N -  Do you have problems with loss of bowel control? N -  Managing your Medications? N -  Managing your Finances? N -  Housekeeping or managing your Housekeeping? N -  Some recent data might be hidden    Patient Care Team: Doree Albee, MD as PCP - General (Internal Medicine) Danie Binder, MD as Consulting Physician (Gastroenterology)   Assessment:   This is a routine wellness examination for Trace.  Exercise Activities and Dietary recommendations Current Exercise Habits: The patient does not participate in regular exercise at present, Exercise limited by: orthopedic condition(s)  Goals    . Weight (lb) < 200 lb (90.7 kg)     Plans on trying to start exercising again once he is released from foot surgeon       Fall Risk Fall Risk  10/20/2019  Falls in the past year? 0  Number falls in past yr: 0  Injury with Fall? 0  Follow up Falls prevention discussed   Is the patient's home free of loose throw rugs in walkways, pet beds, electrical cords, etc?   yes      Grab bars in the bathroom? no      Handrails on the stairs?   yes      Adequate lighting?   yes  Timed Get Up and Go Performed: Not completed today as office visit was conducted remotely  Depression Screen PHQ 2/9 Scores 10/20/2019  PHQ - 2 Score 0    Cognitive Function     6CIT Screen 10/20/2019  What Year? 0 points  What month? 0 points  What  time? 0 points  Count back from 20 0  points  Months in reverse 4 points  Repeat phrase 4 points  Total Score 8    Immunization History  Administered Date(s) Administered  . Influenza,inj,Quad PF,6+ Mos 12/17/2016, 08/24/2019  . Pneumococcal Conjugate-13 08/16/2018    Qualifies for Shingles Vaccine?  Yes  Screening Tests Health Maintenance  Topic Date Due  . Hepatitis C Screening  [birth year]  . TETANUS/TDAP  02/25/1982  . COLONOSCOPY  02/28/2026  . INFLUENZA VACCINE  Completed  . HIV Screening  Completed   Cancer Screenings: Lung: Low Dose CT Chest recommended if Age 6-80 years, 30 pack-year currently smoking OR have quit w/in 15years. Patient does not qualify. Colorectal: Up-to-date.  He will be due again in 2027.  Additional Screenings:  Hepatitis C Screening: He would like to be screened for hepatitis C Prostate cancer screening: We discussed prostate cancer screening and he would like to be screened for this.  Upon further investigation he did mention that he does have frequent urination at night.  He mentions that his urine stream is not weak and he denies any dribbling.  He also denies any pain with urinating. Depression: Negative today.    Plan:   We did discuss his immunizations.  He would be due for tetanus and shingles.  He is interested in having both of these administered.  I encouraged him to discuss the shingles vaccine with his pharmacist.  Consider administering tetanus at his next office visit.  We will screen him for hepatitis C and prostate cancer next office visit.  All other screenings are up-to-date and completed.  We also discussed advanced care planning.  He is not interested in pursuing this at this time, but will consider this for the future.  I have personally reviewed and noted the following in the patient's chart:   . Medical and social history . Use of alcohol, tobacco or illicit drugs  . Current medications and supplements . Functional  ability and status . Nutritional status . Physical activity . Advanced directives . List of other physicians . Hospitalizations, surgeries, and ER visits in previous 12 months . Vitals . Screenings to include cognitive, depression, and falls . Referrals and appointments  In addition, I have reviewed and discussed with patient certain preventive protocols, quality metrics, and best practice recommendations. A written personalized care plan for preventive services as well as general preventive health recommendations were provided to patient.   I encouraged him to call his insurance company to determine what is covered.  I will also send referral for both dentistry and optometry.     He will follow-up in approximately 1 month for blood pressure check and blood work.  He was encouraged to call the office with any questions or concerns prior to his next upcoming appointment.  Elenore Paddy, NP  10/20/2019

## 2019-10-20 NOTE — Patient Instructions (Signed)
Thank you for choosing [facility] as your medical provider! If you have any questions or concerns regarding your health care, please do not hesitate to call our office.  Immunizations: You are probably due for tetanus and shingles vaccinations.  We can administer tetanus shot if you would like at your next office visit.  If you would like to get the shingles vaccine please discuss this with your pharmacist  Health maintenance: You will be due for colon cancer screening again in 2027.  We can screen you for hepatitis C and prostate cancer via blood work at your next office visit.  As we discussed in the office visit I will refer you to an eye doctor for further evaluation of your vision to see if you would need eyeglasses.  I will also send referral to dentistry, I am not sure what your insurance will cover regarding this.  Consider calling your insurance company to determine what is covered, and you may also call around to dentist in your area to see if any can help you with your concerns.  Advance care planning: If you decide you would like to put in place any advanced care plan such as the most form that we discussed during your annual medical wellness visit please let me know.  Please follow-up as scheduled in 1 month. We look forward to seeing you again soon! Have a great Valentine's Day!!  At [facility] we value your feedback. You may receive a survey about your visit today. Please share your experience as we strive to create trusting relationships with our patients to provide genuine, compassionate, quality care.  We appreciate your understanding and patience as we review any laboratory studies, imaging, and other diagnostic tests that are ordered as we care for you. We do our best to address any and all results in a timely manner. If you do not hear about test results within 1 week, please do not hesitate to contact us. If we referred you to a specialist during your visit  or ordered imaging testing, contact the office if you have not been contacted to be scheduled within 1 weeks.  We also encourage the use of MyChart, which contains your medical information for your review as well. If you are not enrolled in this feature, an access code is on this after visit summary for your convenience. Thank you for allowing [place] to be involved in your care.

## 2019-11-10 ENCOUNTER — Encounter: Payer: Medicare HMO | Admitting: Podiatry

## 2019-11-15 ENCOUNTER — Other Ambulatory Visit: Payer: Self-pay

## 2019-11-15 ENCOUNTER — Ambulatory Visit (INDEPENDENT_AMBULATORY_CARE_PROVIDER_SITE_OTHER): Payer: Medicare HMO | Admitting: Nurse Practitioner

## 2019-11-15 VITALS — BP 180/110 | HR 60 | Temp 97.0°F | Resp 18 | Ht 67.0 in | Wt 221.0 lb

## 2019-11-15 DIAGNOSIS — I10 Essential (primary) hypertension: Secondary | ICD-10-CM

## 2019-11-15 DIAGNOSIS — E876 Hypokalemia: Secondary | ICD-10-CM

## 2019-11-15 DIAGNOSIS — M25562 Pain in left knee: Secondary | ICD-10-CM

## 2019-11-15 DIAGNOSIS — R351 Nocturia: Secondary | ICD-10-CM | POA: Diagnosis not present

## 2019-11-15 DIAGNOSIS — Z1159 Encounter for screening for other viral diseases: Secondary | ICD-10-CM | POA: Diagnosis not present

## 2019-11-15 DIAGNOSIS — M25569 Pain in unspecified knee: Secondary | ICD-10-CM | POA: Insufficient documentation

## 2019-11-15 MED ORDER — AMLODIPINE BESYLATE 10 MG PO TABS: 10.0000 mg | ORAL_TABLET | Freq: Every day | ORAL | 0 refills | Status: DC

## 2019-11-15 NOTE — Patient Instructions (Signed)
Thank you for choosing [facility] as your medical provider! If you have any questions or concerns regarding your health care, please do not hesitate to call our office.  Increase your amlodipine from 5 mg daily to 10 mg daily.  Please get a blood pressure cuff so you can check your blood pressure at home.  Your goal is to be <140/90.  If you experience any chest pain, shortness of breath, severe headache, vision changes, slurred speech, difficulty swallowing, increased weakness on one side your body or the other, or worsening sensation changes proceed to the emergency department for emergent evaluation.  Continue your other medications as prescribed and we will discuss your blood results at your next office visit.  We will also see if your blood pressure has improved with increase in amlodipine.  Please follow-up as scheduled in 2 weeks. We look forward to seeing you again soon!   At [facility] we value your feedback. You may receive a survey about your visit today. Please share your experience as we strive to create trusting relationships with our patients to provide genuine, compassionate, quality care.  We appreciate your understanding and patience as we review any laboratory studies, imaging, and other diagnostic tests that are ordered as we care for you. We do our best to address any and all results in a timely manner. If you do not hear about test results within 1 week, please do not hesitate to contact us. If we referred you to a specialist during your visit or ordered imaging testing, contact the office if you have not been contacted to be scheduled within 1 weeks.  We also encourage the use of MyChart, which contains your medical information for your review as well. If you are not enrolled in this feature, an access code is on this after visit summary for your convenience. Thank you for allowing [place] to be involved in your care.

## 2019-11-15 NOTE — Assessment & Plan Note (Signed)
Left knee pain has been persistent for approximately 1 month despite using over-the-counter Tylenol.  Patient does not want to take any NSAIDs due to history of gastrointestinal bleed.  Will refer him to orthopedist for further evaluation and management.

## 2019-11-15 NOTE — Progress Notes (Signed)
Subjective:  Patient ID: Scott Ritter, male    DOB: 1960-12-10  Age: 59 y.o. MRN: [id number]  CC:  Chief Complaint  Patient presents with  . Follow-up    Hypertension, knee pain, Hypokalemia      HPI  This patient arrives today for follow-up of the above.  Hypertension: Patient has difficult to control blood pressure.  He is currently on amlodipine 5 mg daily, hydralazine 100 mg 3 times a day, losartan/hydrochlorothiazide 100-12.'5mg'$  daily, and metoprolol 25 mg twice a day.  He tells me that he has not been checking his blood pressure regularly at home.  He tells me he no longer has access to at home blood pressure cuff.  He denies any symptoms of hypertension including blurred vision, headache, chest pain, fatigue, shortness of breath.  Knee pain: The patient also is complaining of left knee pain.  He tells me the pain is severe.  He tells me the pain is sharp.  He does me the pain has been ongoing for about 1 month.  He tells me he recently had surgery on his foot to help him with walking, but since his surgery has been experiencing pain in his knee.  He takes Tylenol as needed for pain management and this relieves his pain only mildly.  He cannot take NSAIDs because he has a history of gastrointestinal bleed when taking ibuprofen in the past.  She would like to be evaluated by an orthopedist.  Hypokalemia: Of note he had mild hypokalemia on his last blood work collected in November 2020.  Potassium was 3.4.  He does continue on potassium supplement.   Past Medical History:  Diagnosis Date  . Hypertension   . Stroke [facility])       Family History  Problem Relation Age of Onset  . Hypertension Mother   . Hypertension Father   . Colon cancer Neg Hx   . Colon polyps Neg Hx     Social History   Social History Narrative  . Not on file   Social History   Tobacco Use  . Smoking status: Never Smoker  . Smokeless tobacco: Never Used  Substance Use Topics  . Alcohol  use: Yes    Alcohol/week: 1.0 standard drinks    Types: 1 Cans of beer per week    Comment: occasionally; once a week or once a month     Current Meds  Medication Sig  . amLODipine (NORVASC) 10 MG tablet Take 1 tablet (10 mg total) by mouth daily. Dose increased due to elevated BP on the '5mg'$   . atorvastatin (LIPITOR) 20 MG tablet Take 20 mg by mouth every evening.   . Cholecalciferol (VITAMIN D) 2000 units CAPS Take 8,000 Units by mouth daily.   . FEROSUL 325 (65 Fe) MG tablet TAKE 1 TABLET TWICE DAILY  . hydrALAZINE (APRESOLINE) 100 MG tablet Take 1 tablet (100 mg total) by mouth 3 (three) times daily.  Marland Kitchen HYDROcodone-acetaminophen (NORCO) 5-325 MG tablet Take 1 tablet by mouth every 6 (six) hours as needed for moderate pain.  Marland Kitchen losartan-hydrochlorothiazide (HYZAAR) 100-12.5 MG tablet Take 1 tablet by mouth daily.  . metoprolol tartrate (LOPRESSOR) 25 MG tablet Take 25 mg by mouth 2 (two) times daily.   . potassium chloride SA (KLOR-CON) 20 MEQ tablet TAKE 1 TABLET TWICE DAILY  . [DISCONTINUED] amLODipine (NORVASC) 5 MG tablet Take 1 tablet (5 mg total) by mouth daily.    ROS:  Review of Systems  Constitutional: Negative for  malaise/fatigue.  Eyes: Negative for blurred vision.  Respiratory: Negative for shortness of breath.   Cardiovascular: Negative for chest pain and palpitations.  Musculoskeletal: Positive for joint pain.  Neurological: Negative for headaches.     Objective:   Today's Vitals: BP (!) 180/110 (BP Location: Right Arm, Patient Position: Sitting, Cuff Size: Normal) Comment: strong bounding sound.  Pulse 60   Temp (!) 97 F (36.1 C) (Temporal)   Resp 18   Ht '5\' 7"'$  (1.702 m)   Wt 221 lb (100.2 kg)   SpO2 98% Comment: wearing mask.  BMI 34.61 kg/m  Vitals with BMI 11/15/2019 10/20/2019 08/24/2019  Height '5\' 7"'$  '5\' 7"'$  '5\' 7"'$   Weight 221 lbs 214 lbs 217 lbs  BMI 34.61 38.18 29.93  Systolic 716 967 893  Diastolic 810 175 102  Pulse 60 - 72     Physical  Exam Vitals reviewed.  Constitutional:      Appearance: Normal appearance.  HENT:     Head: Normocephalic and atraumatic.  Cardiovascular:     Rate and Rhythm: Normal rate and regular rhythm.  Pulmonary:     Effort: Pulmonary effort is normal.     Breath sounds: Normal breath sounds.  Musculoskeletal:     Cervical back: Neck supple.  Skin:    General: Skin is warm and dry.  Neurological:     Mental Status: He is alert and oriented to person, place, and time.  Psychiatric:        Mood and Affect: Mood normal.        Behavior: Behavior normal.        Thought Content: Thought content normal.        Judgment: Judgment normal.          Assessment   1. Acute pain of left knee   2. Essential hypertension   3. Hypertension, unspecified type       Tests ordered Orders Placed This Encounter  Procedures  . CMP with eGFR([facility])  . Aldosterone  . Aldosterone + renin activity w/ ratio  . Ambulatory referral to Orthopedic Surgery     Plan: Please see assessment and plan per problem list below.   Meds ordered this encounter  Medications  . amLODipine (NORVASC) 10 MG tablet    Sig: Take 1 tablet (10 mg total) by mouth daily. Dose increased due to elevated BP on the '5mg'$     Dispense:  90 tablet    Refill:  0    Order Specific Question:   Supervising Provider    Answer:   Doree Albee [[id number]]    Patient to follow-up in 2 weeks.  Ailene Ards, NP

## 2019-11-15 NOTE — Assessment & Plan Note (Signed)
Patient to continue on potassium supplement at this time.  We will collect metabolic panel today for further evaluation.

## 2019-11-15 NOTE — Assessment & Plan Note (Addendum)
Blood pressure is still quite elevated and uncontrolled.  Patient is currently asymptomatic at this time.  We will increase his amlodipine from 5 mg to 10 mg daily.  We will also check blood work to screen for primary hyperaldosteronism.  May also need to consider ultrasound of renal arteries to screen for renal artery stenosis.  Patient to return in 2 weeks for blood pressure check.  Patient given instructions and after visit summary so that he knows when to seek emergent care.

## 2019-11-16 ENCOUNTER — Other Ambulatory Visit (INDEPENDENT_AMBULATORY_CARE_PROVIDER_SITE_OTHER): Payer: Self-pay | Admitting: Nurse Practitioner

## 2019-11-16 DIAGNOSIS — E876 Hypokalemia: Secondary | ICD-10-CM

## 2019-11-16 NOTE — Progress Notes (Signed)
Patient will require repeat metabolic panel in approximately 1 week due to hypokalemia.  Patient will be called by medical assistant to determine if patient is in fact taking his potassium supplement as prescribed regularly.  If he is not she will educate him that he needs to take his potassium supplement regularly.  If he is, she will tell him to take 2 tablets by mouth in the morning of his potassium supplement and 1 tablet by mouth in the evening of his potassium supplement.  Then we will recheck potassium in 1 week.

## 2019-11-16 NOTE — Progress Notes (Signed)
Patient called. NO room on voicemail to leave instructions. Will try back later today.

## 2019-11-17 ENCOUNTER — Telehealth (INDEPENDENT_AMBULATORY_CARE_PROVIDER_SITE_OTHER): Payer: Self-pay | Admitting: Nurse Practitioner

## 2019-11-17 NOTE — Telephone Encounter (Signed)
Pt was called to given lab results and instructions. Pt is doing 1 tablet in the morning & 1 tablet in the evening. Pt was given instructions to take 2 tablets in morning then 1 tablet in the evening.  Next he will come back in a week to get blood work and visit to check to see how he is doing.

## 2019-11-17 NOTE — Telephone Encounter (Signed)
Please reattempt to call this patient today to go over his lab results.  My main concern is his low potassium.  I need you to make sure that he is taking his potassium as prescribed.  If he is taking it as prescribed then we need to increase his dose from 1 tablet of potassium by mouth twice a day to 2 tablets of potassium in the morning and 1 tablet of potassium in the evening.  If he is not taking his potassium as prescribed then you need to reiterate the fact that it is very important he is taking his potassium as prescribed.  He then needs to follow-up in the office in 1 week for blood work.  I need to recheck his potassium to make sure it is coming back up. So please get him scheduled for a lab draw, I will make sure the order is placed in the computer.   Thank you.

## 2019-11-17 NOTE — Telephone Encounter (Signed)
Wonderful, thank you

## 2019-11-20 LAB — COMPLETE METABOLIC PANEL WITH GFR
AG Ratio: 1.4 (calc) (ref 1.0–2.5)
ALT: 11 U/L (ref 9–46)
AST: 19 U/L (ref 10–35)
Albumin: 4.1 g/dL (ref 3.6–5.1)
Alkaline phosphatase (APISO): 90 U/L (ref 35–144)
BUN/Creatinine Ratio: 13 (calc) (ref 6–22)
BUN: 18 mg/dL (ref 7–25)
CO2: 24 mmol/L (ref 20–32)
Calcium: 9 mg/dL (ref 8.6–10.3)
Chloride: 102 mmol/L (ref 98–110)
Creat: 1.38 mg/dL — ABNORMAL HIGH (ref 0.70–1.33)
GFR, Est African American: 66 mL/min/{1.73_m2} (ref >=60)
GFR, Est Non African American: 57 mL/min/{1.73_m2} — ABNORMAL LOW (ref >=60)
Globulin: 3 g/dL (calc) (ref 1.9–3.7)
Glucose, Bld: 109 mg/dL — ABNORMAL HIGH (ref 65–99)
Potassium: 3.1 mmol/L — ABNORMAL LOW (ref 3.5–5.3)
Sodium: 137 mmol/L (ref 135–146)
Total Bilirubin: 0.6 mg/dL (ref 0.2–1.2)
Total Protein: 7.1 g/dL (ref 6.1–8.1)

## 2019-11-20 LAB — PSA: PSA: 0.3 ng/mL (ref <=4.0)

## 2019-11-20 LAB — HEPATITIS C ANTIBODY
Hepatitis C Ab: NONREACTIVE
SIGNAL TO CUT-OFF: 0.04 (ref <1.00)

## 2019-11-20 LAB — ALDOSTERONE + RENIN ACTIVITY W/ RATIO
ALDO / PRA Ratio: 87.5 Ratio — ABNORMAL HIGH (ref 0.9–28.9)
Aldosterone: 7 ng/dL
Renin Activity: 0.08 ng/mL/h — ABNORMAL LOW (ref 0.25–5.82)

## 2019-11-21 ENCOUNTER — Other Ambulatory Visit (INDEPENDENT_AMBULATORY_CARE_PROVIDER_SITE_OTHER): Payer: Self-pay | Admitting: Nurse Practitioner

## 2019-11-21 ENCOUNTER — Other Ambulatory Visit (INDEPENDENT_AMBULATORY_CARE_PROVIDER_SITE_OTHER): Payer: Self-pay

## 2019-11-21 DIAGNOSIS — I10 Essential (primary) hypertension: Secondary | ICD-10-CM

## 2019-11-21 NOTE — Progress Notes (Signed)
Okay, Dr Karilyn Cota recommends Dr. Hyman Hopes in [place].

## 2019-11-21 NOTE — Progress Notes (Unsigned)
Pt new reffal order entered  For Lynwood Dawley to add notes for visit.

## 2019-11-21 NOTE — Progress Notes (Signed)
Patient called.Pt potassium medication was taking  1 tab/2x day. Now with new instruction from last week Pt is taking 2 tablets in AM & 1 in the PM.

## 2019-11-21 NOTE — Progress Notes (Signed)
Scott Ritter, please call this patient and let him know that the rest of his blood work resulted. I did have Dr. Karilyn Cota review the blood work as well and we agreed to refer patient to nephrology for further evaluation of possible secondary causes for his difficult to control high blood pressure. If he has any questions let me know. Thank you.

## 2019-11-25 ENCOUNTER — Ambulatory Visit (INDEPENDENT_AMBULATORY_CARE_PROVIDER_SITE_OTHER): Payer: Medicare HMO | Admitting: Podiatry

## 2019-11-25 ENCOUNTER — Other Ambulatory Visit: Payer: Self-pay

## 2019-11-25 DIAGNOSIS — G5762 Lesion of plantar nerve, left lower limb: Secondary | ICD-10-CM

## 2019-11-27 ENCOUNTER — Other Ambulatory Visit (INDEPENDENT_AMBULATORY_CARE_PROVIDER_SITE_OTHER): Payer: Self-pay | Admitting: Internal Medicine

## 2019-12-01 ENCOUNTER — Other Ambulatory Visit: Payer: Self-pay

## 2019-12-01 ENCOUNTER — Ambulatory Visit (INDEPENDENT_AMBULATORY_CARE_PROVIDER_SITE_OTHER): Payer: Medicare HMO | Admitting: Nurse Practitioner

## 2019-12-01 DIAGNOSIS — E876 Hypokalemia: Secondary | ICD-10-CM | POA: Diagnosis not present

## 2019-12-01 DIAGNOSIS — I10 Essential (primary) hypertension: Secondary | ICD-10-CM

## 2019-12-01 DIAGNOSIS — D509 Iron deficiency anemia, unspecified: Secondary | ICD-10-CM | POA: Diagnosis not present

## 2019-12-01 DIAGNOSIS — M25562 Pain in left knee: Secondary | ICD-10-CM

## 2019-12-01 DIAGNOSIS — D500 Iron deficiency anemia secondary to blood loss (chronic): Secondary | ICD-10-CM | POA: Diagnosis not present

## 2019-12-01 NOTE — Assessment & Plan Note (Signed)
He was encouraged to follow-up with his orthopedist as scheduled, he tells me he will.

## 2019-12-01 NOTE — Assessment & Plan Note (Signed)
I again encouraged the patient to get an at home blood pressure cuff to monitor his blood pressure.  I told him that his goal is to have a blood pressure less than 140/90.  He is scheduled to follow-up with nephrology for further evaluation of possible secondary causes for his hypertension.  He was encouraged to keep this appointment and tells me that he will.  He will follow-up in the office in approximately 2 weeks for blood pressure recheck.

## 2019-12-01 NOTE — Assessment & Plan Note (Signed)
Per his request I will collect iron studies for further evaluation.  He was encouraged also follow-up with his gastroenterologist in [place] as scheduled.  He tells me he understands.

## 2019-12-01 NOTE — Assessment & Plan Note (Signed)
Unfortunately due to inclement weather today, this visit is being done remotely.  However, patient is willing to go to the outpatient lab to have blood drawn tomorrow.  Thus I will send an order to recollect metabolic panel to evaluate his hypokalemia.  Further recommendations may be made based upon these results.

## 2019-12-01 NOTE — Progress Notes (Signed)
.Due to national recommendations of social distancing related to the [place] pandemic, an audio/visual tele-health visit was felt to be the most appropriate encounter type for this patient today. I connected with  Vevelyn Francois on 12/01/19 utilizing audio/visual technology and verified that I am speaking with the correct person using two identifiers. The patient was located at their home, and I was located at my home during the encounter. I discussed the limitations of evaluation and management by telemedicine. The patient expressed understanding and agreed to proceed.      Subjective:  Patient ID: Scott Ritter, male    DOB: [birth year]  Age: 59 y.o. MRN: [id number]  CC:  Chief Complaint  Patient presents with  . Follow-up    hypokalemia, hypertension, knee pain, anemia       HPI  This patient presents today for an acute visit for the above.  Hypokalemia: This patient was last seen in the office approximately 2 weeks ago, at which time blood work was taken and showed that he was mildly hypokalemic.  At that time I had my medical assistant call him and increase his potassium from 20 mEq twice a day to 40 mEq in the morning and 20 mEq in the evening.  The patient tells me he has been taking his medications as recommended.  He denies any cardiac palpitations, weakness, or chest pain.  Hypertension: He also has very difficult to control hypertension.  He continues on amlodipine 10 mg daily, hydralazine 100 mg 3 times a day, losartan-hydrochlorothiazide 100-12.5 mg daily, and metoprolol 25 mg twice a day.  We increased his amlodipine from 5 to 10 mg as last office visit.  In addition I did screen him for possible hyperaldosteronism.  Lab results came back borderline, thus he was referred to nephrology for further evaluation and management.  Unfortunately the patient tells me that he was planning on getting an at home blood pressure cuff, but has not done this and thus does not have one  available for visit today.  He denies any chest pain, shortness of breath, fatigue, weakness, headache, blurry vision.  Knee pain: He was also concerned about left knee pain at our last office visit and I referred him to orthopedics.  He tells me he does have an appointment coming up with them in the next couple of weeks.  Anemia: He also mentioned that he continues on his iron supplement for iron deficiency anemia.  He is being followed and evaluated by GI for this.  He is wondering if he needs to continue with his iron supplement.   Past Medical History:  Diagnosis Date  . Hypertension   . Stroke [facility])       Family History  Problem Relation Age of Onset  . Hypertension Mother   . Hypertension Father   . Colon cancer Neg Hx   . Colon polyps Neg Hx     Social History   Social History Narrative  . Not on file   Social History   Tobacco Use  . Smoking status: Never Smoker  . Smokeless tobacco: Never Used  Substance Use Topics  . Alcohol use: Yes    Alcohol/week: 1.0 standard drinks    Types: 1 Cans of beer per week    Comment: occasionally; once a week or once a month     Current Meds  Medication Sig  . amLODipine (NORVASC) 10 MG tablet Take 1 tablet (10 mg total) by mouth daily. Dose increased due to elevated  BP on the 49m  . atorvastatin (LIPITOR) 20 MG tablet Take 20 mg by mouth every evening.   . Cholecalciferol (VITAMIN D) 2000 units CAPS Take 8,000 Units by mouth daily.   . FEROSUL 325 (65 Fe) MG tablet TAKE 1 TABLET TWICE DAILY  . hydrALAZINE (APRESOLINE) 100 MG tablet Take 1 tablet (100 mg total) by mouth 3 (three) times daily.  .Marland KitchenHYDROcodone-acetaminophen (NORCO) 5-325 MG tablet Take 1 tablet by mouth every 6 (six) hours as needed for moderate pain.  .Marland Kitchenlosartan-hydrochlorothiazide (HYZAAR) 100-12.5 MG tablet Take 1 tablet by mouth daily.  . metoprolol tartrate (LOPRESSOR) 25 MG tablet TAKE 1 TABLET TWICE DAILY    ROS:  Negative unless otherwise stated in  his HPI   Objective:   Today's Vitals: There were no vitals taken for this visit. Vitals with BMI 12/01/2019 11/15/2019 10/20/2019  Height - 5' 7" 5' 7"  Weight - 221 lbs 214 lbs  BMI - [phone] Systolic (No Data) 12092470 Diastolic (No Data) [id number] Pulse - 60 -     Physical Exam Comprehensive physical exam not completed today as office visit was conducted remotely.  Patient appeared well on video.  Patient was alert and oriented, and appeared to have appropriate judgment.      Assessment   1. Hypokalemia   2. Iron deficiency anemia, unspecified iron deficiency anemia type   3. Essential hypertension   4. Acute pain of left knee   5. Iron deficiency anemia due to chronic blood loss       Tests ordered Orders Placed This Encounter  Procedures  . CMP with eGFR([facility])  . CBC  . Ferritin  . Iron and TIBC     Plan: Please see assessment and plan per problem list below.   No orders of the defined types were placed in this encounter.   Patient to follow-up in 2 weeks.  Time spent on video call today was approximately 9 minutes.  SAilene Ards NP

## 2019-12-02 DIAGNOSIS — E876 Hypokalemia: Secondary | ICD-10-CM | POA: Diagnosis not present

## 2019-12-02 DIAGNOSIS — D509 Iron deficiency anemia, unspecified: Secondary | ICD-10-CM | POA: Diagnosis not present

## 2019-12-05 LAB — CBC
HCT: 41.7 % (ref 38.5–50.0)
Hemoglobin: 13.6 g/dL (ref 13.2–17.1)
MCH: 28 pg (ref 27.0–33.0)
MCHC: 32.6 g/dL (ref 32.0–36.0)
MCV: 86 fL (ref 80.0–100.0)
MPV: 10.7 fL (ref 7.5–12.5)
Platelets: 317 10*3/uL (ref 140–400)
RBC: 4.85 10*6/uL (ref 4.20–5.80)
RDW: 14.4 % (ref 11.0–15.0)
WBC: 11.1 10*3/uL — ABNORMAL HIGH (ref 3.8–10.8)

## 2019-12-05 LAB — HEPATITIS C ANTIBODY
Hepatitis C Ab: NONREACTIVE
SIGNAL TO CUT-OFF: 0.03 (ref <1.00)

## 2019-12-05 LAB — COMPLETE METABOLIC PANEL WITH GFR
AG Ratio: 1.5 (calc) (ref 1.0–2.5)
ALT: 13 U/L (ref 9–46)
AST: 15 U/L (ref 10–35)
Albumin: 4.4 g/dL (ref 3.6–5.1)
Alkaline phosphatase (APISO): 87 U/L (ref 35–144)
BUN: 16 mg/dL (ref 7–25)
CO2: 25 mmol/L (ref 20–32)
Calcium: 9.9 mg/dL (ref 8.6–10.3)
Chloride: 102 mmol/L (ref 98–110)
Creat: 1.21 mg/dL (ref 0.70–1.33)
GFR, Est African American: 77 mL/min/{1.73_m2} (ref >=60)
GFR, Est Non African American: 67 mL/min/{1.73_m2} (ref >=60)
Globulin: 3 g/dL (calc) (ref 1.9–3.7)
Glucose, Bld: 103 mg/dL — ABNORMAL HIGH (ref 65–99)
Potassium: 3.4 mmol/L — ABNORMAL LOW (ref 3.5–5.3)
Sodium: 137 mmol/L (ref 135–146)
Total Bilirubin: 0.4 mg/dL (ref 0.2–1.2)
Total Protein: 7.4 g/dL (ref 6.1–8.1)

## 2019-12-05 LAB — IRON, TOTAL/TOTAL IRON BINDING CAP
%SAT: 15 % (calc) — ABNORMAL LOW (ref 20–48)
Iron: 47 ug/dL — ABNORMAL LOW (ref 50–180)
TIBC: 311 mcg/dL (calc) (ref 250–425)

## 2019-12-05 LAB — FERRITIN: Ferritin: 115 ng/mL (ref 38–380)

## 2019-12-09 ENCOUNTER — Ambulatory Visit: Payer: Medicare HMO | Admitting: Podiatry

## 2019-12-09 ENCOUNTER — Other Ambulatory Visit: Payer: Self-pay

## 2019-12-09 VITALS — Temp 97.5°F

## 2019-12-09 DIAGNOSIS — G5762 Lesion of plantar nerve, left lower limb: Secondary | ICD-10-CM | POA: Diagnosis not present

## 2019-12-09 NOTE — Progress Notes (Signed)
  Subjective:  Patient ID: Scott Ritter, male    DOB: 1961/01/23,  MRN: [id number]  Chief Complaint  Patient presents with  . Post-op Follow-up    POV #4 for L hallux extensus. Pt stated, "Still having pain most of the time. 7/10 day". Pt denies fever/chills/N&V.   59 y.o. male presents with the above complaint. History confirmed with patient.   Objective:  Physical Exam: warm, good capillary refill, no trophic changes or ulcerative lesions, normal DP and PT pulses and normal sensory exam. Left Foot: tenderness between the 3rd and 4th metatarsal head. Hallux no longer in extensus.  Assessment:   1. Morton neuroma, left    Plan:  Patient was evaluated and treated and all questions answered.  Interdigital Neuroma -Injection delivered to the affected interspaces. Injection #2  Procedure: Neuroma Injection Location: Left 3r interspace Skin Prep: Alcohol. Injectate: 0.5 cc 0.5% marcaine plain, 0.5 cc celestone . Disposition: Patient tolerated procedure well. Injection site dressed with a band-aid.  No follow-ups on file.

## 2019-12-09 NOTE — Progress Notes (Signed)
  Subjective:  Patient ID: Scott Ritter, male    DOB: October 29, 1960,  MRN: [id number]  Chief Complaint  Patient presents with  . Post-op Follow-up    dos 11.20.20 pt states he has his day that he feels some improvement. But still exp pain in ball area of foot     59 y.o. male presents with the above complaint. History confirmed with patient.   Objective:  Physical Exam: warm, good capillary refill, no trophic changes or ulcerative lesions, normal DP and PT pulses and normal sensory exam. Left Foot: tenderness between 3rd/4th metatarsal heads; hallux rectus    Assessment:   1. Morton neuroma, left    Plan:  Patient was evaluated and treated and all questions answered.  Morton Neuroma -Educated on etiology -Educated on padding and proper shoegear -Injection delivered to the affected interspaces  Procedure: Neuroma Injection Location: Left 3rd interspace Skin Prep: Alcohol. Injectate: 0.5 cc 0.5% marcaine plain, 0.5 cc betamethasone phosphate. Disposition: Patient tolerated procedure well. Injection site dressed with a band-aid.  Return in about 3 weeks (around 12/16/2019) for Neuroma, Left.

## 2019-12-12 ENCOUNTER — Ambulatory Visit (INDEPENDENT_AMBULATORY_CARE_PROVIDER_SITE_OTHER): Payer: Medicare HMO | Admitting: Internal Medicine

## 2019-12-12 ENCOUNTER — Ambulatory Visit: Payer: Medicare HMO | Admitting: Orthopedic Surgery

## 2019-12-12 ENCOUNTER — Other Ambulatory Visit: Payer: Self-pay

## 2019-12-12 ENCOUNTER — Encounter (INDEPENDENT_AMBULATORY_CARE_PROVIDER_SITE_OTHER): Payer: Self-pay | Admitting: Internal Medicine

## 2019-12-12 VITALS — BP 140/81 | HR 88 | Temp 98.1°F | Resp 18 | Ht 67.0 in | Wt 218.6 lb

## 2019-12-12 DIAGNOSIS — I10 Essential (primary) hypertension: Secondary | ICD-10-CM

## 2019-12-12 DIAGNOSIS — E876 Hypokalemia: Secondary | ICD-10-CM | POA: Diagnosis not present

## 2019-12-12 NOTE — Progress Notes (Signed)
Metrics: Intervention Frequency ACO  Documented Smoking Status Yearly  Screened one or more times in 24 months  Cessation Counseling or  Active cessation medication Past 24 months  Past 24 months   Guideline developer: UpToDate (See UpToDate for funding source) Date Released: 2014       Wellness Office Visit  Subjective:  Patient ID: Scott Ritter, male    DOB: 07-Mar-1961  Age: 59 y.o. MRN: [id number]  CC: This man comes in for follow-up of hypertension and hypokalemia. HPI  He is amlodipine has been increased to 10 mg daily and he is tolerating this dose.  Also he was investigated for hyperaldosteronism, the results appeared to be borderline and he is due to see nephrology in a couple of weeks time. He has increased his potassium intake so he is taking a total of 60 mEq daily according to him. His uncontrolled hypertension thankfully did not give him any bad symptoms.  He denies any headache, lightheadedness, chest pain, neurological symptoms. Past Medical History:  Diagnosis Date  . Hypertension   . Stroke [facility])       Family History  Problem Relation Age of Onset  . Hypertension Mother   . Hypertension Father   . Colon cancer Neg Hx   . Colon polyps Neg Hx     Social History   Social History Narrative  . Not on file   Social History   Tobacco Use  . Smoking status: Never Smoker  . Smokeless tobacco: Never Used  Substance Use Topics  . Alcohol use: Yes    Alcohol/week: 1.0 standard drinks    Types: 1 Cans of beer per week    Comment: occasionally; once a week or once a month    Current Meds  Medication Sig  . amLODipine (NORVASC) 10 MG tablet Take 1 tablet (10 mg total) by mouth daily. Dose increased due to elevated BP on the 5mg   . atorvastatin (LIPITOR) 20 MG tablet Take 20 mg by mouth every evening.   . Cholecalciferol (VITAMIN D) 2000 units CAPS Take 8,000 Units by mouth daily.   . FEROSUL 325 (65 Fe) MG tablet TAKE 1 TABLET TWICE DAILY  .  hydrALAZINE (APRESOLINE) 100 MG tablet Take 1 tablet (100 mg total) by mouth 3 (three) times daily.  losartan-hydrochlorothiazide (HYZAAR) 100-12.5 MG tablet Take 1 tablet by mouth daily.  . metoprolol tartrate (LOPRESSOR) 25 MG tablet TAKE 1 TABLET TWICE DAILY  . potassium chloride SA (KLOR-CON) 20 MEQ tablet TAKE 1 TABLET TWICE DAILY (Patient taking differently: Take 20 mEq by mouth 3 (three) times daily. )      Objective:   Today's Vitals: BP 140/81 (BP Location: Right Arm, Patient Position: Sitting, Cuff Size: Normal)   Pulse 88   Temp 98.1 F (36.7 C) (Temporal)   Resp 18   Ht 5\' 7"  (1.702 m)   Wt 218 lb 9.6 oz (99.2 kg)   SpO2 98%   BMI 34.24 kg/m  Vitals with BMI 12/12/2019 12/01/2019 11/15/2019  Height 5\' 7"  - 5\' 7"   Weight 218 lbs 10 oz - 221 lbs  BMI 34.23 - 34.61  Systolic 140 (No Data) 180  Diastolic 81 (No Data) 110  Pulse 88 - 60     Physical Exam   He looks systemically well.  His blood pressure is significantly improved from the last time he was seen in the office.  He has also lost about 3 pounds in weight.  He is alert and orientated without any  focal neurological signs.    Assessment   1. Hypokalemia   2. Uncontrolled hypertension       Tests ordered Orders Placed This Encounter  Procedures  . COMPLETE METABOLIC PANEL WITH GFR     Plan: 1. I am going to check electrolytes today to make sure his potassium is improving. 2. He will continue with the same dose of potassium for the time being and less his potassium is still low. 3. He will continue with all antihypertensive medications as before as this seems to have his blood pressure under better control. 4. Further recommendations will depend on blood results and he will follow-up with Sarah in about 3 months time and I have encouraged him to make sure he keeps his appointment with a nephrologist.   No orders of the defined types were placed in this encounter.   Doree Albee, MD

## 2019-12-13 LAB — COMPLETE METABOLIC PANEL WITH GFR
AG Ratio: 1.3 (calc) (ref 1.0–2.5)
ALT: 17 U/L (ref 9–46)
AST: 19 U/L (ref 10–35)
Albumin: 4.3 g/dL (ref 3.6–5.1)
Alkaline phosphatase (APISO): 103 U/L (ref 35–144)
BUN: 19 mg/dL (ref 7–25)
CO2: 26 mmol/L (ref 20–32)
Calcium: 9.7 mg/dL (ref 8.6–10.3)
Chloride: 102 mmol/L (ref 98–110)
Creat: 1.23 mg/dL (ref 0.70–1.33)
GFR, Est African American: 76 mL/min/{1.73_m2} (ref >=60)
GFR, Est Non African American: 65 mL/min/{1.73_m2} (ref >=60)
Globulin: 3.2 g/dL (calc) (ref 1.9–3.7)
Glucose, Bld: 87 mg/dL (ref 65–99)
Potassium: 3.6 mmol/L (ref 3.5–5.3)
Sodium: 140 mmol/L (ref 135–146)
Total Bilirubin: 0.5 mg/dL (ref 0.2–1.2)
Total Protein: 7.5 g/dL (ref 6.1–8.1)

## 2019-12-13 NOTE — Progress Notes (Signed)
Patient called. Given the lab results. Pt was glad to here it is better. He will continue to take all medicines as directed.

## 2019-12-13 NOTE — Progress Notes (Signed)
Called noted.

## 2019-12-19 NOTE — Progress Notes (Signed)
  Subjective:  Patient ID: Scott Ritter, male    DOB: [birth year],  MRN: [id number]  No chief complaint on file.   59 y.o. male presents for follow up of left foot pain. States the pain is worse since last heere. Has pain in the toe and inability to wear shoegear.   Review of Systems: Negative except as noted in the HPI. Denies N/V/F/Ch.  Past Medical History:  Diagnosis Date  . Hypertension   . Stroke [facility])     Current Outpatient Medications:  .  amLODipine (NORVASC) 10 MG tablet, Take 1 tablet (10 mg total) by mouth daily. Dose increased due to elevated BP on the 5mg , Disp: 90 tablet, Rfl: 0 .  atorvastatin (LIPITOR) 20 MG tablet, Take 20 mg by mouth every evening. , Disp: , Rfl:  .  Cholecalciferol (VITAMIN D) 2000 units CAPS, Take 8,000 Units by mouth daily. , Disp: , Rfl:  .  FEROSUL 325 (65 Fe) MG tablet, TAKE 1 TABLET TWICE DAILY, Disp: 180 tablet, Rfl: 0 .  hydrALAZINE (APRESOLINE) 100 MG tablet, Take 1 tablet (100 mg total) by mouth 3 (three) times daily., Disp: 90 tablet, Rfl: 3 .  losartan-hydrochlorothiazide (HYZAAR) 100-12.5 MG tablet, Take 1 tablet by mouth daily., Disp: , Rfl:  .  metoprolol tartrate (LOPRESSOR) 25 MG tablet, TAKE 1 TABLET TWICE DAILY, Disp: 180 tablet, Rfl: 0 .  potassium chloride SA (KLOR-CON) 20 MEQ tablet, TAKE 1 TABLET TWICE DAILY (Patient taking differently: Take 20 mEq by mouth 3 (three) times daily. ), Disp: 180 tablet, Rfl: 1  Social History   Tobacco Use  Smoking Status Never Smoker  Smokeless Tobacco Never Used    No Known Allergies Objective:  There were no vitals filed for this visit. There is no height or weight on file to calculate BMI. Constitutional Well developed. Well nourished.  Vascular Dorsalis pedis pulses palpable bilaterally. Posterior tibial pulses palpable bilaterally. Capillary refill normal to all digits.  No cyanosis or clubbing noted. Pedal hair growth normal.  Neurologic Normal speech. Oriented to  person, place, and time. Epicritic sensation to light touch grossly present bilaterally.  Dermatologic Nails well groomed and normal in appearance. No open wounds. No skin lesions.  Orthopedic:  Pain to the plantar aspect the left first metatarsal phalangeal joint.  Left hallux purchase with prominent hallux extensor tendon   Assessment:   1. Hallux extensus, acquired, left   2. Contracture of tendon sheath    Plan:  Patient was evaluated and treated and all questions answered.  Hallux Extensus -Still having pain. Discussed with patient performing in office flexor tenotomy in attempt to alleviate contracture. Patient would like to proceed. Consent reviewed and signed. F/u in 1 week for the procedure.  Return in about 1 week (around 09/02/2019).

## 2019-12-24 ENCOUNTER — Other Ambulatory Visit (INDEPENDENT_AMBULATORY_CARE_PROVIDER_SITE_OTHER): Payer: Self-pay | Admitting: Internal Medicine

## 2019-12-28 ENCOUNTER — Ambulatory Visit: Payer: Medicare HMO | Admitting: Orthopedic Surgery

## 2020-01-06 ENCOUNTER — Ambulatory Visit: Payer: Medicare HMO | Admitting: Podiatry

## 2020-01-11 ENCOUNTER — Other Ambulatory Visit (INDEPENDENT_AMBULATORY_CARE_PROVIDER_SITE_OTHER): Payer: Self-pay | Admitting: Nurse Practitioner

## 2020-01-11 DIAGNOSIS — I10 Essential (primary) hypertension: Secondary | ICD-10-CM

## 2020-01-13 ENCOUNTER — Other Ambulatory Visit (INDEPENDENT_AMBULATORY_CARE_PROVIDER_SITE_OTHER): Payer: Self-pay | Admitting: Internal Medicine

## 2020-01-26 ENCOUNTER — Ambulatory Visit: Payer: Medicare HMO | Admitting: Podiatry

## 2020-01-26 ENCOUNTER — Other Ambulatory Visit: Payer: Self-pay

## 2020-01-26 ENCOUNTER — Encounter: Payer: Self-pay | Admitting: Podiatry

## 2020-01-26 VITALS — Temp 97.6°F

## 2020-01-26 DIAGNOSIS — G5762 Lesion of plantar nerve, left lower limb: Secondary | ICD-10-CM | POA: Diagnosis not present

## 2020-01-26 NOTE — Progress Notes (Signed)
  Subjective:  Patient ID: Scott Ritter, male    DOB: [birth year],  MRN: [id number]  Chief Complaint  Patient presents with  . Follow-up    L Morton's neuroma. Pt stated, "My pain is worse - it's been 10/10 for the past 4 weeks. The last injection helped for awhile. I take Tylenol and soak in Epsom salt. No injuries".   59 y.o. male presents with the above complaint. History confirmed with patient.   Objective:  Physical Exam: warm, good capillary refill, no trophic changes or ulcerative lesions, normal DP and PT pulses and normal sensory exam. Left Foot: tenderness between the 3rd and 4th metatarsal head. Hallux no longer in extensus.  Assessment:   1. Morton neuroma, left    Plan:  Patient was evaluated and treated and all questions answered.  Interdigital Neuroma -Injection delivered to the affected interspaces. Injection #3  Procedure: Neuroma Injection Location: Left 3rd interspace Skin Prep: Alcohol. Injectate: 0.5 cc 0.5% marcaine plain, 0.5 cc dexamethasone phosphate. Disposition: Patient tolerated procedure well. Injection site dressed with a band-aid.  No follow-ups on file.

## 2020-01-30 ENCOUNTER — Other Ambulatory Visit: Payer: Self-pay

## 2020-01-30 ENCOUNTER — Ambulatory Visit: Payer: Medicare Other | Attending: Internal Medicine

## 2020-01-30 DIAGNOSIS — Z20822 Contact with and (suspected) exposure to covid-19: Secondary | ICD-10-CM

## 2020-01-31 LAB — NOVEL CORONAVIRUS, NAA: SARS-CoV-2, NAA: DETECTED — AB

## 2020-01-31 LAB — SARS-COV-2, NAA 2 DAY TAT

## 2020-02-01 ENCOUNTER — Telehealth: Payer: Self-pay | Admitting: Unknown Physician Specialty

## 2020-02-01 ENCOUNTER — Other Ambulatory Visit: Payer: Self-pay | Admitting: Unknown Physician Specialty

## 2020-02-01 ENCOUNTER — Ambulatory Visit (HOSPITAL_COMMUNITY)
Admission: RE | Admit: 2020-02-01 | Discharge: 2020-02-01 | Disposition: A | Discharge status: Home / Self Care | Payer: Medicare Other | Source: Ambulatory Visit | Attending: Pulmonary Disease | Admitting: Pulmonary Disease

## 2020-02-01 DIAGNOSIS — I10 Essential (primary) hypertension: Secondary | ICD-10-CM

## 2020-02-01 DIAGNOSIS — U071 COVID-19: Secondary | ICD-10-CM | POA: Diagnosis present

## 2020-02-01 DIAGNOSIS — Z23 Encounter for immunization: Secondary | ICD-10-CM | POA: Insufficient documentation

## 2020-02-01 MED ORDER — FAMOTIDINE IN NACL 20-0.9 MG/50ML-% IV SOLN: 20.0000 mg | Freq: Once | INTRAVENOUS | Status: DC | PRN

## 2020-02-01 MED ORDER — SODIUM CHLORIDE 0.9 % IV SOLN
Freq: Once | INTRAVENOUS | Status: AC
  Filled 2020-02-01: qty 700

## 2020-02-01 MED ORDER — METHYLPREDNISOLONE SODIUM SUCC 125 MG IJ SOLR: 125.0000 mg | Freq: Once | INTRAMUSCULAR | Status: DC | PRN

## 2020-02-01 MED ORDER — DIPHENHYDRAMINE HCL 50 MG/ML IJ SOLN: 50.0000 mg | Freq: Once | INTRAMUSCULAR | Status: DC | PRN

## 2020-02-01 MED ORDER — EPINEPHRINE 0.3 MG/0.3ML IJ SOAJ: 0.3000 mg | Freq: Once | INTRAMUSCULAR | Status: DC | PRN

## 2020-02-01 MED ORDER — ALBUTEROL SULFATE HFA 108 (90 BASE) MCG/ACT IN AERS: 2.0000 | INHALATION_SPRAY | Freq: Once | RESPIRATORY_TRACT | Status: DC | PRN

## 2020-02-01 MED ORDER — SODIUM CHLORIDE 0.9 % IV SOLN: INTRAVENOUS | Status: DC | PRN

## 2020-02-01 NOTE — Telephone Encounter (Signed)
  I connected by phone with Scott Ritter on 02/01/2020 at 8:57 AM to discuss the potential use of an new treatment for mild to moderate COVID-19 viral infection in non-hospitalized patients.  This patient is a 59 y.o. male that meets the FDA criteria for Emergency Use Authorization of bamlanivimab/etesevimab or casirivimab/imdevimab.  Has a (+) direct SARS-CoV-2 viral test result  Has mild or moderate COVID-19   Is ? 57 years of age and weighs ? 40 kg  Is NOT hospitalized due to COVID-19  Is NOT requiring oxygen therapy or requiring an increase in baseline oxygen flow rate due to COVID-19  Is within 10 days of symptom onset  Has at least one of the high risk factor(s) for progression to severe COVID-19 and/or hospitalization as defined in EUA.  Specific high risk criteria : Hypertension   I have spoken and communicated the following to the patient or parent/caregiver:  1. FDA has authorized the emergency use of bamlanivimab/etesevimab and casirivimab\imdevimab for the treatment of mild to moderate COVID-19 in adults and pediatric patients with positive results of direct SARS-CoV-2 viral testing who are 62 years of age and older weighing at least 40 kg, and who are at high risk for progressing to severe COVID-19 and/or hospitalization.  2. The significant known and potential risks and benefits of bamlanivimab/etesevimab and casirivimab\imdevimab, and the extent to which such potential risks and benefits are unknown.  3. Information on available alternative treatments and the risks and benefits of those alternatives, including clinical trials.  4. Patients treated with bamlanivimab/etesevimab and casirivimab\imdevimab should continue to self-isolate and use infection control measures (e.g., wear mask, isolate, social distance, avoid sharing personal items, clean and disinfect "high touch" surfaces, and frequent handwashing) according to CDC guidelines.   5. The patient or  parent/caregiver has the option to accept or refuse bamlanivimab/etesevimab or casirivimab\imdevimab .  Discussed with patient at length that since he has had the J&J vaccine, we don't have evidence of risk involved in the setting of the J&J vaccine and risks may outweigh benefits.  After reviewing this information with the patient, The patient agreed to proceed with receiving the bamlanimivab infusion and will be provided a copy of the Fact sheet prior to receiving the infusion.Scott Ritter 02/01/2020 8:57 AM

## 2020-02-01 NOTE — Discharge Instructions (Signed)

## 2020-02-01 NOTE — Progress Notes (Signed)
  Diagnosis: COVID-19  Physician:Dr Delford Field   Procedure: [facility] Infusion [facility] Med: bamlanivimab\etesevimab infusion - Provided patient with bamlanimivab\etesevimab fact sheet for patients, parents and caregivers prior to infusion.  Complications: No immediate complications noted.  Discharge: Discharged home   Chrisandra Netters Apple 02/01/2020

## 2020-02-01 NOTE — Progress Notes (Signed)
  Day  5 of symptom onset

## 2020-02-01 NOTE — Telephone Encounter (Signed)
I discussed with patient about Covid symptoms and the use of bamlanivimab, a monoclonal antibody infusion for those with mild to moderate Covid symptoms and at a high risk of hospitalization.  Pt had J&J 9 days ago.  Pt is qualified for this infusion at the [facility] infusion center due to Age >55 and Hypertension Symptomatic for 4-5 days   I will discuss if mab is indicated with the infectious disease team

## 2020-02-14 ENCOUNTER — Other Ambulatory Visit: Payer: Self-pay

## 2020-02-14 ENCOUNTER — Encounter: Payer: Self-pay | Admitting: Gastroenterology

## 2020-02-14 ENCOUNTER — Ambulatory Visit: Payer: Medicare Other | Admitting: Gastroenterology

## 2020-02-14 VITALS — BP 170/84 | HR 65 | Temp 97.1°F | Ht 67.0 in | Wt 215.6 lb

## 2020-02-14 DIAGNOSIS — D500 Iron deficiency anemia secondary to blood loss (chronic): Secondary | ICD-10-CM

## 2020-02-14 NOTE — Progress Notes (Signed)
Referring Provider: Wilson Singer, MD Primary Care Physician:  Elenore Paddy, NP  Primary GI: Dr. Darrick Penna   Chief Complaint  Patient presents with  . Anemia    doing ok    HPI:   Scott Ritter is a 59 y.o. male presenting today with a history of IDA. Hospitalization in 2019 with GI bleed requiring transfusion, with capsule study noting AVM, single non-bleeding erosions, possible ulcer in small bowel. EGD/push enteroscopy found gastritis, 2 nonbleeding angiectasia's in the duodenum were treated with APC coagulation status post biopsy.  Most recent labs in Feb 2021 with ferritin 115, improved from several months ago. Hgb 13.6. On oral iron once daily. No melena, hematochezia. No abdominal pain. No N/V. Was taking Motrin at time of GI bleed.  Covid in mid April. Doing well now. No NSAIDs. No PPI. No GERD, no dysphagia. Wanting to avoid PPI.     Past Medical History:  Diagnosis Date  . Hypertension   . Stroke [facility])     Past Surgical History:  Procedure Laterality Date  . BACK SURGERY    . BIOPSY  09/17/2018   Procedure: BIOPSY;  Surgeon: West Bali, MD;  Location[facility];  Service: Endoscopy;;  duodenal biopsy   . COLONOSCOPY N/A 02/29/2016   Dr. Darrick Penna: redundant colon, non-bleeding internal hemorrhoids   . ESOPHAGOGASTRODUODENOSCOPY N/A 12/17/2016   Dr. Darrick Penna: 3 mm nonbleeding Mallory-Weiss tear.  EGD performed for hematemesis.  Hemoglobin normal.  . ESOPHAGOGASTRODUODENOSCOPY N/A 02/19/2018   Dr. Jena Gauss: Performed for melena, hemoglobin of 6.  Mucosal changes in the esophagus query short segment Barrett's, biopsy more consistent with reflux changes.  Erosive gastropathy but no H. pylori.  Duodenal erosions.  Suspected NSAID induced injury.  . ESOPHAGOGASTRODUODENOSCOPY  08/18/2018   Dr. Karilyn Cota: IDA/heme positive stool.  Esophageal mucosal changes consistent with short segment Barrett's esophagus, not biopsied.  2 cm hiatal hernia.  Duodenal bulb, second  portion of duodenum, third portion of the duodenum.  Video capsule somewhat difficult to pass through the oropharynx but was eventually advanced into the second portion of the duodenum and released.  . ESOPHAGOGASTRODUODENOSCOPY N/A 08/18/2018   Procedure: ESOPHAGOGASTRODUODENOSCOPY (EGD);  Surgeon: Malissa Hippo, MD;  [facility];  Service: Endoscopy;  Laterality: N/A;  . ESOPHAGOGASTRODUODENOSCOPY N/A 09/17/2018   gastritis, 2 nonbleeding angiectasia's in the duodenum were treated with APC coagulation status post biopsy.  Marland Kitchen GIVENS CAPSULE STUDY N/A 08/18/2018   Procedure: GIVENS CAPSULE STUDY;  Surgeon: Malissa Hippo, MD;  Location: [facility];  Service: Endoscopy;  Laterality: N/A;  . HERNIA REPAIR      Current Outpatient Medications  Medication Sig Dispense Refill  . amLODipine (NORVASC) 10 MG tablet TAKE 1 TABLET EVERY DAY (DOSE INCREASE) 90 tablet 0  . atorvastatin (LIPITOR) 20 MG tablet Take 20 mg by mouth every evening.     . Cholecalciferol (VITAMIN D) 2000 units CAPS Take 8,000 Units by mouth daily.     . FEROSUL 325 (65 Fe) MG tablet TAKE 1 TABLET TWICE DAILY 180 tablet 0  . hydrALAZINE (APRESOLINE) 100 MG tablet TAKE 1 TABLET THREE TIMES DAILY 270 tablet 1  . losartan-hydrochlorothiazide (HYZAAR) 100-12.5 MG tablet Take 1 tablet by mouth daily.    . metoprolol tartrate (LOPRESSOR) 25 MG tablet TAKE 1 TABLET TWICE DAILY 180 tablet 0  . potassium chloride SA (KLOR-CON) 20 MEQ tablet TAKE 1 TABLET TWICE DAILY (Patient taking differently: Take 20 mEq by mouth  2 (two) times daily. ) 180 tablet 1   No current facility-administered medications for this visit.    Allergies as of 02/14/2020  . (No Known Allergies)    Family History  Problem Relation Age of Onset  . Hypertension Mother   . Hypertension Father   . Colon cancer Neg Hx   . Colon polyps Neg Hx     Social History   Socioeconomic History  . Marital status: Married    Spouse name: Not on file  .  Number of children: Not on file  . Years of education: Not on file  . Highest education level: Not on file  Occupational History  . Not on file  Tobacco Use  . Smoking status: Never Smoker  . Smokeless tobacco: Never Used  Substance and Sexual Activity  . Alcohol use: Yes    Alcohol/week: 1.0 standard drinks    Types: 1 Cans of beer per week    Comment: occasionally; once a week or once a month  . Drug use: No  . Sexual activity: Not on file  Other Topics Concern  . Not on file  Social History Narrative  . Not on file   Social Determinants of Health   Financial Resource Strain:   . Difficulty of Paying Living Expenses:   Food Insecurity:   . Worried About Programme researcher, broadcasting/film/video in the Last Year:   . Barista in the Last Year:   Transportation Needs:   . Freight forwarder (Medical):   Marland Kitchen Lack of Transportation (Non-Medical):   Physical Activity:   . Days of Exercise per Week:   . Minutes of Exercise per Session:   Stress:   . Feeling of Stress :   Social Connections:   . Frequency of Communication with Friends and Family:   . Frequency of Social Gatherings with Friends and Family:   . Attends Religious Services:   . Active Member of Clubs or Organizations:   . Attends Banker Meetings:   Marland Kitchen Marital Status:     Review of Systems: Gen: Denies fever, chills, anorexia. Denies fatigue, weakness, weight loss.  CV: Denies chest pain, palpitations, syncope, peripheral edema, and claudication. Resp: Denies dyspnea at rest, cough, wheezing, coughing up blood, and pleurisy. GI: see HPI Derm: Denies rash, itching, dry skin Psych: Denies depression, anxiety, memory loss, confusion. No homicidal or suicidal ideation.  Heme: Denies bruising, bleeding, and enlarged lymph nodes.  Physical Exam: BP (!) 170/84   Pulse 65   Temp (!) 97.1 F (36.2 C) (Temporal)   Ht 5\' 7"  (1.702 m)   Wt 215 lb 9.6 oz (97.8 kg)   BMI 33.77 kg/m  General:   Alert and  oriented. No distress noted. Pleasant and cooperative.  Head:  Normocephalic and atraumatic. Eyes:  Conjuctiva clear without scleral icterus. Mouth:  Mask in place Abdomen:  +BS, soft, non-tender and non-distended. No rebound or guarding. No HSM or masses noted. Msk:  Symmetrical without gross deformities. Normal posture. Extremities:  Without edema. Neurologic:  Alert and  oriented x4 Psych:  Alert and cooperative. Normal mood and affect.  ASSESSMENT: Scott Ritter is a 59 y.o. 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 Most recent Hgb 13.6, with ferritin 115. Serially followed by PCP. Continues on once daily iron for now. Absolutely avoiding NSAIDs. No symptomatic GERD, no history of Barrett's, not on daily aspirin  or antiplatelet therapy. He would like to avoid PPI for now. We discussed a length indications for resuming. As he is doing well, will see him back in 1 year. Continue close follow-up with PCP and strict avoidance of NSAIDs as he is doing.   PLAN:   Return in 1 year  Call if any overt GI bleeding  Continue strict avoidance of NSAIDs.   Annitta Needs, PhD, ANP-BC [facility]

## 2020-02-14 NOTE — Patient Instructions (Signed)
We will see you back in 1 year or sooner if needed!  Please call if bright red blood, black and tarry stool, abdominal pain, reflux, nausea, vomiting.   Continue to avoid Ibuprofen, Motrin, Aleve, aspirin powders, etc as you are doing!  It was a pleasure to see you today. I want to create trusting relationships with patients to provide genuine, compassionate, and quality care. I value your feedback. If you receive a survey regarding your visit,  I greatly appreciate you taking time to fill this out.   Gelene Mink, PhD, ANP-BC [facility]

## 2020-02-15 ENCOUNTER — Encounter: Payer: Self-pay | Admitting: Gastroenterology

## 2020-02-16 ENCOUNTER — Ambulatory Visit (INDEPENDENT_AMBULATORY_CARE_PROVIDER_SITE_OTHER): Payer: Medicare HMO | Admitting: Podiatry

## 2020-02-16 ENCOUNTER — Ambulatory Visit (INDEPENDENT_AMBULATORY_CARE_PROVIDER_SITE_OTHER): Payer: Medicare HMO

## 2020-02-16 ENCOUNTER — Other Ambulatory Visit: Payer: Self-pay

## 2020-02-16 VITALS — Temp 97.3°F

## 2020-02-16 DIAGNOSIS — G5762 Lesion of plantar nerve, left lower limb: Secondary | ICD-10-CM

## 2020-02-16 NOTE — Progress Notes (Signed)
  Subjective:  Patient ID: Scott Ritter, male    DOB: 1961-03-07,  MRN: [id number]  Chief Complaint  Patient presents with  . Routine Post Op     3wk F/U POV DOS 09/02/2019 HALLUX EXTENSUS LT. Pt stated, "No improvement - 9/10 pain".   59 y.o. male presents with the above complaint. History confirmed with patient.   Objective:  Physical Exam: warm, good capillary refill, no trophic changes or ulcerative lesions, normal DP and PT pulses and normal sensory exam. Left Foot: tenderness between the 3rd and 4th metatarsal head.  Assessment:   1. Morton neuroma, left    Plan:  Patient was evaluated and treated and all questions answered.  Interdigital Neuroma  -XR reviewed decreased IMA -Start sclerosing injection    Procedure: Sclerosing Nerve Injection Location: Left 3rd interspace Skin Prep: Alcohol. Injectate: 1.5 cc 4% sclerosing alcohol injection Disposition: Patient tolerated procedure well. Injection site dressed with a band-aid.  Return in about 3 weeks (around 03/08/2020) for Neuroma, Left.

## 2020-02-21 ENCOUNTER — Other Ambulatory Visit (INDEPENDENT_AMBULATORY_CARE_PROVIDER_SITE_OTHER): Payer: Self-pay | Admitting: Internal Medicine

## 2020-02-24 ENCOUNTER — Other Ambulatory Visit (INDEPENDENT_AMBULATORY_CARE_PROVIDER_SITE_OTHER): Payer: Self-pay | Admitting: Internal Medicine

## 2020-02-27 ENCOUNTER — Other Ambulatory Visit (INDEPENDENT_AMBULATORY_CARE_PROVIDER_SITE_OTHER): Payer: Self-pay

## 2020-02-27 ENCOUNTER — Telehealth (INDEPENDENT_AMBULATORY_CARE_PROVIDER_SITE_OTHER): Payer: Self-pay | Admitting: Internal Medicine

## 2020-02-27 MED ORDER — LOSARTAN POTASSIUM-HCTZ 100-12.5 MG PO TABS: 1.0000 | ORAL_TABLET | Freq: Every day | ORAL | 1 refills | Status: DC

## 2020-02-27 NOTE — Telephone Encounter (Signed)
Canceled note due to getting approved by insurance first

## 2020-02-27 NOTE — Telephone Encounter (Signed)
Scott Ritter is asking for a 1 month supply of his Losartan be sent to [facility]

## 2020-02-29 ENCOUNTER — Other Ambulatory Visit (INDEPENDENT_AMBULATORY_CARE_PROVIDER_SITE_OTHER): Payer: Self-pay | Admitting: Internal Medicine

## 2020-03-08 ENCOUNTER — Ambulatory Visit (INDEPENDENT_AMBULATORY_CARE_PROVIDER_SITE_OTHER): Payer: Medicare HMO | Admitting: Podiatry

## 2020-03-08 DIAGNOSIS — Z5329 Procedure and treatment not carried out because of patient's decision for other reasons: Secondary | ICD-10-CM

## 2020-03-08 NOTE — Progress Notes (Signed)
No show for appt. 

## 2020-03-15 ENCOUNTER — Encounter: Payer: Self-pay | Admitting: Podiatry

## 2020-03-15 ENCOUNTER — Ambulatory Visit: Payer: Medicare HMO | Admitting: Podiatry

## 2020-03-15 ENCOUNTER — Other Ambulatory Visit: Payer: Self-pay

## 2020-03-15 DIAGNOSIS — G5762 Lesion of plantar nerve, left lower limb: Secondary | ICD-10-CM | POA: Diagnosis not present

## 2020-03-15 NOTE — Progress Notes (Signed)
  Subjective:  Patient ID: Scott Ritter, male    DOB: 1960/12/17,  MRN: [id number]  Chief Complaint  Patient presents with  . Neuroma    Pt states the pain has increased, and nothing seems to help, taking OTC meds, icing, and soaking foot. Injections seem to help only temp relief    59 y.o. male presents with the above complaint. History confirmed with patient.   Objective:  Physical Exam: warm, good capillary refill, no trophic changes or ulcerative lesions, normal DP and PT pulses and normal sensory exam. Left Foot: tenderness between the 3rd and 4th metatarsal head.  Assessment:   1. Morton neuroma, left    Plan:  Patient was evaluated and treated and all questions answered.  Interdigital Neuroma  -Restart sclerosing injection #2   Procedure: Sclerosing Nerve Injection Location: Left 3rd interspace Skin Prep: Alcohol. Injectate: 1.5 cc 4% sclerosing alcohol injection Disposition: Patient tolerated procedure well. Injection site dressed with a band-aid.  No follow-ups on file.

## 2020-03-21 ENCOUNTER — Encounter (INDEPENDENT_AMBULATORY_CARE_PROVIDER_SITE_OTHER): Payer: Self-pay | Admitting: Nurse Practitioner

## 2020-03-21 ENCOUNTER — Ambulatory Visit (INDEPENDENT_AMBULATORY_CARE_PROVIDER_SITE_OTHER): Payer: Medicare HMO | Admitting: Nurse Practitioner

## 2020-03-21 ENCOUNTER — Other Ambulatory Visit: Payer: Self-pay

## 2020-03-21 VITALS — BP 142/80 | HR 67 | Temp 97.7°F | Ht 67.0 in | Wt 218.8 lb

## 2020-03-21 DIAGNOSIS — E782 Mixed hyperlipidemia: Secondary | ICD-10-CM

## 2020-03-21 DIAGNOSIS — I10 Essential (primary) hypertension: Secondary | ICD-10-CM

## 2020-03-21 DIAGNOSIS — E876 Hypokalemia: Secondary | ICD-10-CM | POA: Diagnosis not present

## 2020-03-21 LAB — COMPLETE METABOLIC PANEL WITH GFR
AG Ratio: 1.3 (calc) (ref 1.0–2.5)
ALT: 11 U/L (ref 9–46)
AST: 17 U/L (ref 10–35)
Albumin: 4.1 g/dL (ref 3.6–5.1)
Alkaline phosphatase (APISO): 85 U/L (ref 35–144)
BUN: 14 mg/dL (ref 7–25)
CO2: 22 mmol/L (ref 20–32)
Calcium: 9.2 mg/dL (ref 8.6–10.3)
Chloride: 103 mmol/L (ref 98–110)
Creat: 1.15 mg/dL (ref 0.70–1.33)
GFR, Est African American: 81 mL/min/{1.73_m2} (ref >=60)
GFR, Est Non African American: 70 mL/min/{1.73_m2} (ref >=60)
Globulin: 3.2 g/dL (calc) (ref 1.9–3.7)
Glucose, Bld: 104 mg/dL — ABNORMAL HIGH (ref 65–99)
Potassium: 3.4 mmol/L — ABNORMAL LOW (ref 3.5–5.3)
Sodium: 138 mmol/L (ref 135–146)
Total Bilirubin: 0.5 mg/dL (ref 0.2–1.2)
Total Protein: 7.3 g/dL (ref 6.1–8.1)

## 2020-03-21 LAB — LIPID PANEL
Cholesterol: 188 mg/dL (ref <200)
HDL: 35 mg/dL — ABNORMAL LOW (ref >=40)
LDL Cholesterol (Calc): 127 mg/dL (calc) — ABNORMAL HIGH
Non-HDL Cholesterol (Calc): 153 mg/dL (calc) — ABNORMAL HIGH (ref <130)
Total CHOL/HDL Ratio: 5.4 (calc) — ABNORMAL HIGH (ref <5.0)
Triglycerides: 144 mg/dL (ref <150)

## 2020-03-21 MED ORDER — POTASSIUM CHLORIDE CRYS ER 20 MEQ PO TBCR: 60.0000 meq | EXTENDED_RELEASE_TABLET | Freq: Every day | ORAL | 2 refills | Status: DC

## 2020-03-21 NOTE — Progress Notes (Signed)
Subjective:  Patient ID: Scott Ritter, male    DOB: 1961-04-05  Age: 59 y.o. MRN: [id number]  CC:  Chief Complaint  Patient presents with  . Hypertension  . Hyperlipidemia  . Other    Hypokalemia      HPI  This patient comes in today for the above.  Hypertension: He continues on amlodipine 10 mg daily, hydralazine 100 mg 3 times a day, losartan/hydrochlorothiazide 100-12.5 mg daily, and metoprolol 25 mg twice a day.  He is tolerating his medications well.  Hyperlipidemia: Last lipid panel was collected approximately 1 year ago.  He continues on his atorvastatin 20 mg daily.  He has had a stroke in the past.  Last LDL was 96.  Hypokalemia: He has been experiencing some hypokalemia.  Possibly secondary to his use of hydrochlorothiazide.  However I did run some screening tests for hyperaldosteronism in the past and his results were borderline suspicious for hyperaldosteronism.Marland Kitchen  He is currently taking 60 mEq of potassium daily.  Last blood work was collected in March and it showed potassium level of 3.6.  He has been referred to nephrology for further evaluation for causes of of possible secondary hypertension, he has not been evaluated yet.  He tells me he has an appointment coming up, however he is going need to reschedule.   Past Medical History:  Diagnosis Date  . Hypertension   . Stroke [facility])       Family History  Problem Relation Age of Onset  . Hypertension Mother   . Hypertension Father   . Colon cancer Neg Hx   . Colon polyps Neg Hx     Social History   Social History Narrative  . Not on file   Social History   Tobacco Use  . Smoking status: Never Smoker  . Smokeless tobacco: Never Used  Substance Use Topics  . Alcohol use: Yes    Alcohol/week: 1.0 standard drinks    Types: 1 Cans of beer per week    Comment: occasionally; once a week or once a month     Current Meds  Medication Sig  . amLODipine (NORVASC) 10 MG tablet TAKE 1 TABLET EVERY  DAY (DOSE INCREASE)  . atorvastatin (LIPITOR) 20 MG tablet Take 20 mg by mouth every evening.   . Cholecalciferol (VITAMIN D) 2000 units CAPS Take 8,000 Units by mouth daily.   . FEROSUL 325 (65 Fe) MG tablet TAKE 1 TABLET TWICE DAILY  . hydrALAZINE (APRESOLINE) 100 MG tablet TAKE 1 TABLET THREE TIMES DAILY  . losartan-hydrochlorothiazide (HYZAAR) 100-12.5 MG tablet Take 1 tablet by mouth daily.  . metoprolol tartrate (LOPRESSOR) 25 MG tablet TAKE 1 TABLET TWICE DAILY    ROS:  Review of Systems  Constitutional: Negative for fever.  Eyes: Negative for blurred vision.  Respiratory: Negative for cough and shortness of breath.   Cardiovascular: Negative for chest pain and palpitations.  Neurological: Negative for dizziness and headaches.     Objective:   Today's Vitals: BP (!) 142/80   Pulse 67   Temp 97.7 F (36.5 C)   Ht _0  (1.702 m)   Wt 218 lb 12.8 oz (99.2 kg)   SpO2 98%   BMI 34.27 kg/m  Vitals with BMI 03/21/2020 02/14/2020 02/01/2020  Height _1  _2  -  Weight 218 lbs 13 oz 215 lbs 10 oz -  BMI 55.97 41.63 -  Systolic 845 364 680  Diastolic 80 84 72  Pulse 67 65 53  Physical Exam Vitals reviewed.  Constitutional:      Appearance: Normal appearance.  HENT:     Head: Normocephalic and atraumatic.  Cardiovascular:     Rate and Rhythm: Normal rate and regular rhythm.  Pulmonary:     Effort: Pulmonary effort is normal.     Breath sounds: Normal breath sounds.  Musculoskeletal:     Cervical back: Neck supple.  Skin:    General: Skin is warm and dry.  Neurological:     Mental Status: He is alert and oriented to person, place, and time.     Gait: Gait abnormal.  Psychiatric:        Mood and Affect: Mood normal.        Behavior: Behavior normal.        Thought Content: Thought content normal.        Judgment: Judgment normal.          Assessment and Plan   1. Essential hypertension   2. Hypokalemia   3. Mixed hyperlipidemia      Plan: 1.,  2.  Blood pressure does remain above goal, however is much better than it has been in the past.  He will continue on his current medication regimen for now.  I will collect metabolic panel for further evaluation of hypokalemia.  I have encouraged him to make sure that he does attend his appointment with the nephrologist.  I have explained to him the reasoning why I want him to see the nephrologist, and he tells me he understands.  3.  He will continue taking his atorvastatin as prescribed.  We will check lipid panel today. Tests ordered Orders Placed This Encounter  Procedures  . CMP with eGFR([facility])  . Lipid Panel      Meds ordered this encounter  Medications  . potassium chloride SA (KLOR-CON) 20 MEQ tablet    Sig: Take 3 tablets (60 mEq total) by mouth daily.    Dispense:  90 tablet    Refill:  2    Order Specific Question:   Supervising Provider    Answer:   Doree Albee [[id number]]    Patient to follow-up in 3 months or sooner as needed  Ailene Ards, NP

## 2020-03-22 ENCOUNTER — Other Ambulatory Visit (INDEPENDENT_AMBULATORY_CARE_PROVIDER_SITE_OTHER): Payer: Self-pay | Admitting: Nurse Practitioner

## 2020-03-22 DIAGNOSIS — E876 Hypokalemia: Secondary | ICD-10-CM

## 2020-03-29 ENCOUNTER — Ambulatory Visit: Payer: Medicare HMO | Admitting: Podiatry

## 2020-03-29 ENCOUNTER — Other Ambulatory Visit: Payer: Self-pay

## 2020-03-29 DIAGNOSIS — G5762 Lesion of plantar nerve, left lower limb: Secondary | ICD-10-CM

## 2020-03-29 NOTE — Progress Notes (Signed)
  Subjective:  Patient ID: Scott Ritter, male    DOB: [birth year],  MRN: [id number]  Chief Complaint  Patient presents with  . Neuroma    Pt states minimal improvement from previous injection. No new concerns. Pt still painful.   59 y.o. male presents with the above complaint. History confirmed with patient.   Objective:  Physical Exam: warm, good capillary refill, no trophic changes or ulcerative lesions, normal DP and PT pulses and normal sensory exam. Left Foot: tenderness between the 3rd and 4th metatarsal head.  Assessment:   1. Morton neuroma, left    Plan:  Patient was evaluated and treated and all questions answered.  Interdigital Neuroma  -Repeat sclerosing injection #3   Procedure: Neurolysis Location: Left 3rd interspace Skin Prep: Alcohol. Injectate: 4% alcohol sclerosing injection. Disposition: Patient tolerated procedure well. Injection site dressed with a band-aid.  No follow-ups on file.

## 2020-04-04 ENCOUNTER — Other Ambulatory Visit (INDEPENDENT_AMBULATORY_CARE_PROVIDER_SITE_OTHER): Payer: Medicare HMO

## 2020-04-04 ENCOUNTER — Other Ambulatory Visit: Payer: Self-pay

## 2020-04-04 DIAGNOSIS — E782 Mixed hyperlipidemia: Secondary | ICD-10-CM | POA: Diagnosis not present

## 2020-04-04 DIAGNOSIS — E876 Hypokalemia: Secondary | ICD-10-CM | POA: Diagnosis not present

## 2020-04-04 DIAGNOSIS — I10 Essential (primary) hypertension: Secondary | ICD-10-CM | POA: Diagnosis not present

## 2020-04-05 LAB — COMPLETE METABOLIC PANEL WITH GFR
AG Ratio: 1.5 (calc) (ref 1.0–2.5)
ALT: 16 U/L (ref 9–46)
AST: 22 U/L (ref 10–35)
Albumin: 4.5 g/dL (ref 3.6–5.1)
Alkaline phosphatase (APISO): 99 U/L (ref 35–144)
BUN: 14 mg/dL (ref 7–25)
CO2: 22 mmol/L (ref 20–32)
Calcium: 9.3 mg/dL (ref 8.6–10.3)
Chloride: 103 mmol/L (ref 98–110)
Creat: 1.13 mg/dL (ref 0.70–1.33)
GFR, Est African American: 83 mL/min/{1.73_m2} (ref >=60)
GFR, Est Non African American: 72 mL/min/{1.73_m2} (ref >=60)
Globulin: 3.1 g/dL (calc) (ref 1.9–3.7)
Glucose, Bld: 96 mg/dL (ref 65–99)
Potassium: 3.5 mmol/L (ref 3.5–5.3)
Sodium: 137 mmol/L (ref 135–146)
Total Bilirubin: 0.6 mg/dL (ref 0.2–1.2)
Total Protein: 7.6 g/dL (ref 6.1–8.1)

## 2020-04-05 LAB — LIPID PANEL
Cholesterol: 197 mg/dL (ref <200)
HDL: 38 mg/dL — ABNORMAL LOW (ref >=40)
LDL Cholesterol (Calc): 130 mg/dL (calc) — ABNORMAL HIGH
Non-HDL Cholesterol (Calc): 159 mg/dL (calc) — ABNORMAL HIGH (ref <130)
Total CHOL/HDL Ratio: 5.2 (calc) — ABNORMAL HIGH (ref <5.0)
Triglycerides: 172 mg/dL — ABNORMAL HIGH (ref <150)

## 2020-04-05 LAB — EXTRA LAV TOP TUBE

## 2020-04-19 ENCOUNTER — Ambulatory Visit (INDEPENDENT_AMBULATORY_CARE_PROVIDER_SITE_OTHER): Payer: Medicare HMO | Admitting: Podiatry

## 2020-04-19 ENCOUNTER — Other Ambulatory Visit (INDEPENDENT_AMBULATORY_CARE_PROVIDER_SITE_OTHER): Payer: Self-pay | Admitting: Internal Medicine

## 2020-04-19 DIAGNOSIS — I10 Essential (primary) hypertension: Secondary | ICD-10-CM

## 2020-04-19 DIAGNOSIS — Z5329 Procedure and treatment not carried out because of patient's decision for other reasons: Secondary | ICD-10-CM

## 2020-04-19 NOTE — Progress Notes (Signed)
No show for appt. 

## 2020-04-20 ENCOUNTER — Other Ambulatory Visit (INDEPENDENT_AMBULATORY_CARE_PROVIDER_SITE_OTHER): Payer: Self-pay | Admitting: Internal Medicine

## 2020-04-30 ENCOUNTER — Other Ambulatory Visit (INDEPENDENT_AMBULATORY_CARE_PROVIDER_SITE_OTHER): Payer: Self-pay | Admitting: Internal Medicine

## 2020-05-30 ENCOUNTER — Other Ambulatory Visit (INDEPENDENT_AMBULATORY_CARE_PROVIDER_SITE_OTHER): Payer: Self-pay | Admitting: Internal Medicine

## 2020-06-13 ENCOUNTER — Telehealth (INDEPENDENT_AMBULATORY_CARE_PROVIDER_SITE_OTHER): Payer: Self-pay

## 2020-06-13 DIAGNOSIS — G5760 Lesion of plantar nerve, unspecified lower limb: Secondary | ICD-10-CM

## 2020-06-13 NOTE — Telephone Encounter (Signed)
-----   Message from Lacie Scotts, [place] sent at 06/12/2020  2:04 PM EDT ----- Regarding: FW: Wants to be refered to a differnt foot Dr. Derl Barrow new foot dr not in [address] location. ----- Message ----- From: Verlon Au Sent: 06/05/2020  12:55 PM EDT To: Lacie Scotts, RMA Subject: Wants to be refered to a differnt foot Dr.     Jeanene Erb and stated that he wants to be referred to a different foot Dr. Because the one he has right now is doing nothing for him.  His call back number is [phone]

## 2020-06-26 ENCOUNTER — Ambulatory Visit (INDEPENDENT_AMBULATORY_CARE_PROVIDER_SITE_OTHER): Payer: Medicare HMO | Admitting: Nurse Practitioner

## 2020-06-26 ENCOUNTER — Other Ambulatory Visit: Payer: Self-pay

## 2020-06-26 ENCOUNTER — Encounter (INDEPENDENT_AMBULATORY_CARE_PROVIDER_SITE_OTHER): Payer: Self-pay | Admitting: Nurse Practitioner

## 2020-06-26 VITALS — BP 158/80 | HR 63 | Temp 96.9°F | Resp 16 | Ht 67.0 in | Wt 217.2 lb

## 2020-06-26 DIAGNOSIS — I10 Essential (primary) hypertension: Secondary | ICD-10-CM

## 2020-06-26 DIAGNOSIS — D509 Iron deficiency anemia, unspecified: Secondary | ICD-10-CM | POA: Diagnosis not present

## 2020-06-26 DIAGNOSIS — E782 Mixed hyperlipidemia: Secondary | ICD-10-CM

## 2020-06-26 DIAGNOSIS — Z0000 Encounter for general adult medical examination without abnormal findings: Secondary | ICD-10-CM | POA: Diagnosis not present

## 2020-06-26 DIAGNOSIS — E876 Hypokalemia: Secondary | ICD-10-CM

## 2020-06-26 MED ORDER — POTASSIUM CHLORIDE CRYS ER 20 MEQ PO TBCR: 20.0000 meq | EXTENDED_RELEASE_TABLET | Freq: Three times a day (TID) | ORAL | 1 refills | Status: DC

## 2020-06-26 MED ORDER — ATORVASTATIN CALCIUM 40 MG PO TABS: 40.0000 mg | ORAL_TABLET | Freq: Every evening | ORAL | 0 refills | Status: DC

## 2020-06-26 NOTE — Patient Instructions (Addendum)
We changed your atorvastatin dose from 20mg  by mouth daily to 40mg  by mouth daily.   Make sure to call the kidney doctor and reschedule your appointment for further evaluation of your low potassium levels.

## 2020-06-26 NOTE — Progress Notes (Signed)
Subjective:  Patient ID: Scott Ritter, male    DOB: 11/13/1960  Age: 59 y.o. MRN: [id number]  CC:  Chief Complaint  Patient presents with  . Hyperlipidemia  . Hypertension  . Other    Hypokalemia, questions about vaccines  . Anemia      HPI  This patient comes in today for the above.  Hyperlipidemia: On atorvastatin 20 mg daily.  Last LDL collected approximately 3 months ago was 130.  He does have a history of stroke and GI bleed.  Hypertension: Continues on amlodipine 10 mg daily, hydralazine 100 mg 3 times a day, losartan-hydrochlorothiazide 100-12 0.5, and metoprolol 25 mg twice a day.  Tells me is tolerating his medications well.  Hypokalemia: Does have a history of hypokalemia and tells me he is currently taking 20 mEq of potassium 3 times a day.  He tells me he was wondering when he can stop this.  He was encouraged to see a nephrologist and was referred to 1.  He tells me he has not been able to complete this appointment, but plans on rescheduling it.  Vaccination questions: He has questions regarding whether or not she take the shingles vaccine and questions about recommendations for booster shot for the COVID-19 vaccine.  Anemia: He has a history of iron deficiency anemia secondary to GI blood loss related to NSAID use.  He tells me has been on iron supplement for about a year and a half, and is wondering if he can stop this.  He denies seeing any visible blood in his stool.  Past Medical History:  Diagnosis Date  . Hypertension   . Stroke [facility])       Family History  Problem Relation Age of Onset  . Hypertension Mother   . Hypertension Father   . Colon cancer Neg Hx   . Colon polyps Neg Hx     Social History   Social History Narrative  . Not on file   Social History   Tobacco Use  . Smoking status: Never Smoker  . Smokeless tobacco: Never Used  Substance Use Topics  . Alcohol use: Yes    Alcohol/week: 1.0 standard drink    Types: 1 Cans  of beer per week    Comment: occasionally; once a week or once a month     Current Meds  Medication Sig  . amLODipine (NORVASC) 10 MG tablet TAKE 1 TABLET EVERY DAY  (DOSE  INCREASE)  . atorvastatin (LIPITOR) 40 MG tablet Take 1 tablet (40 mg total) by mouth every evening.  . Cholecalciferol (VITAMIN D) 2000 units CAPS Take 8,000 Units by mouth daily.   . potassium chloride SA (KLOR-CON) 20 MEQ tablet Take 1 tablet (20 mEq total) by mouth 3 (three) times daily.  . [DISCONTINUED] atorvastatin (LIPITOR) 20 MG tablet Take 40 mg by mouth every evening.  . [DISCONTINUED] potassium chloride SA (KLOR-CON) 20 MEQ tablet TAKE 1 TABLET TWICE DAILY    ROS:  Review of Systems  Constitutional: Negative for malaise/fatigue.  Eyes: Negative for blurred vision.  Respiratory: Negative for shortness of breath.   Cardiovascular: Negative for chest pain.  Gastrointestinal: Negative for abdominal pain and blood in stool.  Neurological: Negative for headaches.     Objective:   Today's Vitals: BP (!) 158/80   Pulse 63   Temp (!) 96.9 F (36.1 C)   Resp 16   Ht $R'5\' 7"'PR$  (1.702 m)   Wt 217 lb 3.2 oz (98.5 kg)  SpO2 98%   BMI 34.02 kg/m  Vitals with BMI 06/26/2020 03/21/2020 02/14/2020  Height $Remov'5\' 7"'XlUcHz$  $Remove'5\' 7"'SSSgBsM$  $RemoveB'5\' 7"'pzkHxwHc$   Weight 217 lbs 3 oz 218 lbs 13 oz 215 lbs 10 oz  BMI 34.01 28.78 67.67  Systolic 209 470 962  Diastolic 80 80 84  Pulse 63 67 65     Physical Exam Vitals reviewed.  Constitutional:      Appearance: Normal appearance.  HENT:     Head: Normocephalic and atraumatic.  Cardiovascular:     Rate and Rhythm: Normal rate and regular rhythm.  Pulmonary:     Effort: Pulmonary effort is normal.     Breath sounds: Normal breath sounds.  Musculoskeletal:     Cervical back: Neck supple.  Skin:    General: Skin is warm and dry.  Neurological:     Mental Status: He is alert and oriented to person, place, and time.     Gait: Gait abnormal.  Psychiatric:        Mood and Affect: Mood normal.          Behavior: Behavior normal.        Thought Content: Thought content normal.        Judgment: Judgment normal.          Assessment and Plan   1. Mixed hyperlipidemia   2. Hypokalemia   3. Iron deficiency anemia, unspecified iron deficiency anemia type   4. Essential hypertension   5. Healthcare maintenance      Plan: 1.  Per shared decision making we will increase atorvastatin from 20 to 40 mg daily.  He will return in 6 weeks for blood work check.  He is encouraged to let me know if he feels unwell on the increased dose of his atorvastatin. 2.  We will check potassium level today and he is encouraged to follow-up with nephrology for further evaluation and management of his hypokalemia. 3.  We will check CBC, iron level, and ferritin level.  May consider trialing off iron supplement and rechecking blood work in 6 weeks when he comes in for lipid panel and metabolic panel check. 4.  He will continue on his current medication regimen for now.  He was counseled to call our office or proceed to the hospital if he experiences any headache, blurry vision, chest pain, fatigue, difficulty breathing, and/or weakness or sensory changes. 5.  I encouraged him to consider getting the shingles vaccine as he would qualify based on his age.  We did discuss that because he is Medicare coverage would probably be better if he receives it from his pharmacist.  Also discussed that he is not considered moderately to severe immunocompromise so booster shot is not recommended for him at this time, however if booster shot is recommended for all adults he would qualify.  I encouraged him to continue watching the news to wait to see if either the CDC or FDA comes out and recommends booster shots for all adults.  Otherwise for now, he will not get a booster.   Tests ordered Orders Placed This Encounter  Procedures  . CMP with eGFR([facility])  . CBC  . Iron  . Ferritin      Meds ordered this encounter   Medications  . atorvastatin (LIPITOR) 40 MG tablet    Sig: Take 1 tablet (40 mg total) by mouth every evening.    Dispense:  90 tablet    Refill:  0    Order Specific Question:   Supervising  Provider    Answer:   Doree Albee [[id number]]  . potassium chloride SA (KLOR-CON) 20 MEQ tablet    Sig: Take 1 tablet (20 mEq total) by mouth 3 (three) times daily.    Dispense:  180 tablet    Refill:  1    Order Specific Question:   Supervising Provider    Answer:   Doree Albee [[id number]]    Patient to follow-up in 6 weeks for blood work, and 2 months for his office visit.  Ailene Ards, NP

## 2020-06-27 ENCOUNTER — Other Ambulatory Visit (INDEPENDENT_AMBULATORY_CARE_PROVIDER_SITE_OTHER): Payer: Self-pay | Admitting: Nurse Practitioner

## 2020-06-27 ENCOUNTER — Encounter (INDEPENDENT_AMBULATORY_CARE_PROVIDER_SITE_OTHER): Payer: Self-pay | Admitting: Nurse Practitioner

## 2020-06-27 DIAGNOSIS — E782 Mixed hyperlipidemia: Secondary | ICD-10-CM

## 2020-06-27 DIAGNOSIS — E876 Hypokalemia: Secondary | ICD-10-CM

## 2020-06-27 DIAGNOSIS — D509 Iron deficiency anemia, unspecified: Secondary | ICD-10-CM

## 2020-06-27 LAB — CBC
HCT: 39.9 % (ref 38.5–50.0)
Hemoglobin: 13 g/dL — ABNORMAL LOW (ref 13.2–17.1)
MCH: 28.9 pg (ref 27.0–33.0)
MCHC: 32.6 g/dL (ref 32.0–36.0)
MCV: 88.7 fL (ref 80.0–100.0)
MPV: 9.6 fL (ref 7.5–12.5)
Platelets: 390 10*3/uL (ref 140–400)
RBC: 4.5 10*6/uL (ref 4.20–5.80)
RDW: 14.4 % (ref 11.0–15.0)
WBC: 7.8 10*3/uL (ref 3.8–10.8)

## 2020-06-27 LAB — COMPLETE METABOLIC PANEL WITH GFR
AG Ratio: 1.3 (calc) (ref 1.0–2.5)
ALT: 11 U/L (ref 9–46)
AST: 19 U/L (ref 10–35)
Albumin: 4.3 g/dL (ref 3.6–5.1)
Alkaline phosphatase (APISO): 104 U/L (ref 35–144)
BUN: 11 mg/dL (ref 7–25)
CO2: 26 mmol/L (ref 20–32)
Calcium: 9.4 mg/dL (ref 8.6–10.3)
Chloride: 102 mmol/L (ref 98–110)
Creat: 1.25 mg/dL (ref 0.70–1.33)
GFR, Est African American: 74 mL/min/{1.73_m2} (ref >=60)
GFR, Est Non African American: 64 mL/min/{1.73_m2} (ref >=60)
Globulin: 3.4 g/dL (calc) (ref 1.9–3.7)
Glucose, Bld: 100 mg/dL — ABNORMAL HIGH (ref 65–99)
Potassium: 3.8 mmol/L (ref 3.5–5.3)
Sodium: 139 mmol/L (ref 135–146)
Total Bilirubin: 0.5 mg/dL (ref 0.2–1.2)
Total Protein: 7.7 g/dL (ref 6.1–8.1)

## 2020-06-27 LAB — FERRITIN: Ferritin: 76 ng/mL (ref 38–380)

## 2020-06-27 LAB — IRON: Iron: 62 ug/dL (ref 50–180)

## 2020-07-04 ENCOUNTER — Other Ambulatory Visit (INDEPENDENT_AMBULATORY_CARE_PROVIDER_SITE_OTHER): Payer: Self-pay | Admitting: Internal Medicine

## 2020-08-02 ENCOUNTER — Other Ambulatory Visit (INDEPENDENT_AMBULATORY_CARE_PROVIDER_SITE_OTHER): Payer: Self-pay | Admitting: Internal Medicine

## 2020-08-02 DIAGNOSIS — I10 Essential (primary) hypertension: Secondary | ICD-10-CM

## 2020-08-07 ENCOUNTER — Ambulatory Visit (INDEPENDENT_AMBULATORY_CARE_PROVIDER_SITE_OTHER): Payer: Medicare HMO

## 2020-08-07 ENCOUNTER — Other Ambulatory Visit: Payer: Self-pay

## 2020-08-07 DIAGNOSIS — D509 Iron deficiency anemia, unspecified: Secondary | ICD-10-CM | POA: Diagnosis not present

## 2020-08-07 DIAGNOSIS — Z23 Encounter for immunization: Secondary | ICD-10-CM

## 2020-08-07 DIAGNOSIS — E782 Mixed hyperlipidemia: Secondary | ICD-10-CM | POA: Diagnosis not present

## 2020-08-07 DIAGNOSIS — E876 Hypokalemia: Secondary | ICD-10-CM | POA: Diagnosis not present

## 2020-08-07 DIAGNOSIS — Z1159 Encounter for screening for other viral diseases: Secondary | ICD-10-CM | POA: Diagnosis not present

## 2020-08-07 NOTE — Progress Notes (Signed)
Patient was given the regular Flu vaccine in his Right Deltoid.  Patient tolerated the injection well.

## 2020-08-08 LAB — COMPLETE METABOLIC PANEL WITH GFR
AG Ratio: 1.2 (calc) (ref 1.0–2.5)
ALT: 10 U/L (ref 9–46)
AST: 17 U/L (ref 10–35)
Albumin: 4.3 g/dL (ref 3.6–5.1)
Alkaline phosphatase (APISO): 97 U/L (ref 35–144)
BUN: 13 mg/dL (ref 7–25)
CO2: 25 mmol/L (ref 20–32)
Calcium: 9.8 mg/dL (ref 8.6–10.3)
Chloride: 103 mmol/L (ref 98–110)
Creat: 1.21 mg/dL (ref 0.70–1.33)
GFR, Est African American: 77 mL/min/{1.73_m2} (ref >=60)
GFR, Est Non African American: 66 mL/min/{1.73_m2} (ref >=60)
Globulin: 3.5 g/dL (calc) (ref 1.9–3.7)
Glucose, Bld: 101 mg/dL — ABNORMAL HIGH (ref 65–99)
Potassium: 3.4 mmol/L — ABNORMAL LOW (ref 3.5–5.3)
Sodium: 139 mmol/L (ref 135–146)
Total Bilirubin: 0.5 mg/dL (ref 0.2–1.2)
Total Protein: 7.8 g/dL (ref 6.1–8.1)

## 2020-08-08 LAB — CBC
HCT: 38.5 % (ref 38.5–50.0)
Hemoglobin: 12.6 g/dL — ABNORMAL LOW (ref 13.2–17.1)
MCH: 28.6 pg (ref 27.0–33.0)
MCHC: 32.7 g/dL (ref 32.0–36.0)
MCV: 87.3 fL (ref 80.0–100.0)
MPV: 9.7 fL (ref 7.5–12.5)
Platelets: 396 10*3/uL (ref 140–400)
RBC: 4.41 10*6/uL (ref 4.20–5.80)
RDW: 13.9 % (ref 11.0–15.0)
WBC: 9.2 10*3/uL (ref 3.8–10.8)

## 2020-08-08 LAB — LIPID PANEL
Cholesterol: 190 mg/dL (ref <200)
HDL: 33 mg/dL — ABNORMAL LOW (ref >=40)
LDL Cholesterol (Calc): 123 mg/dL (calc) — ABNORMAL HIGH
Non-HDL Cholesterol (Calc): 157 mg/dL (calc) — ABNORMAL HIGH (ref <130)
Total CHOL/HDL Ratio: 5.8 (calc) — ABNORMAL HIGH (ref <5.0)
Triglycerides: 216 mg/dL — ABNORMAL HIGH (ref <150)

## 2020-08-08 LAB — HEPATITIS C ANTIBODY
Hepatitis C Ab: NONREACTIVE
SIGNAL TO CUT-OFF: 0.04 (ref <1.00)

## 2020-08-08 LAB — FERRITIN: Ferritin: 78 ng/mL (ref 38–380)

## 2020-08-08 LAB — IRON: Iron: 65 ug/dL (ref 50–180)

## 2020-08-13 ENCOUNTER — Other Ambulatory Visit (INDEPENDENT_AMBULATORY_CARE_PROVIDER_SITE_OTHER): Payer: Self-pay | Admitting: Nurse Practitioner

## 2020-08-13 ENCOUNTER — Other Ambulatory Visit (INDEPENDENT_AMBULATORY_CARE_PROVIDER_SITE_OTHER): Payer: Self-pay | Admitting: Internal Medicine

## 2020-08-22 ENCOUNTER — Other Ambulatory Visit (INDEPENDENT_AMBULATORY_CARE_PROVIDER_SITE_OTHER): Payer: Self-pay | Admitting: Nurse Practitioner

## 2020-08-22 DIAGNOSIS — E782 Mixed hyperlipidemia: Secondary | ICD-10-CM

## 2020-09-03 ENCOUNTER — Ambulatory Visit (INDEPENDENT_AMBULATORY_CARE_PROVIDER_SITE_OTHER): Payer: Medicare HMO | Admitting: Nurse Practitioner

## 2020-09-18 ENCOUNTER — Ambulatory Visit (INDEPENDENT_AMBULATORY_CARE_PROVIDER_SITE_OTHER): Payer: Medicare HMO | Admitting: Nurse Practitioner

## 2020-10-27 ENCOUNTER — Other Ambulatory Visit: Payer: Self-pay

## 2020-10-27 ENCOUNTER — Encounter (HOSPITAL_COMMUNITY): Payer: Self-pay | Admitting: Emergency Medicine

## 2020-10-27 ENCOUNTER — Emergency Department (HOSPITAL_COMMUNITY)
Admission: EM | Admit: 2020-10-27 | Discharge: 2020-10-27 | Disposition: A | Discharge status: Home / Self Care | Payer: Medicare HMO | Attending: Emergency Medicine | Admitting: Emergency Medicine

## 2020-10-27 ENCOUNTER — Emergency Department (HOSPITAL_COMMUNITY): Payer: Medicare HMO

## 2020-10-27 DIAGNOSIS — N182 Chronic kidney disease, stage 2 (mild): Secondary | ICD-10-CM | POA: Diagnosis not present

## 2020-10-27 DIAGNOSIS — Z79899 Other long term (current) drug therapy: Secondary | ICD-10-CM | POA: Insufficient documentation

## 2020-10-27 DIAGNOSIS — M5459 Other low back pain: Secondary | ICD-10-CM | POA: Diagnosis not present

## 2020-10-27 DIAGNOSIS — I129 Hypertensive chronic kidney disease with stage 1 through stage 4 chronic kidney disease, or unspecified chronic kidney disease: Secondary | ICD-10-CM | POA: Insufficient documentation

## 2020-10-27 DIAGNOSIS — M545 Low back pain, unspecified: Secondary | ICD-10-CM | POA: Diagnosis not present

## 2020-10-27 MED ORDER — PREDNISONE 10 MG (21) PO TBPK: ORAL_TABLET | Freq: Every day | ORAL | 0 refills | Status: DC

## 2020-10-27 MED ORDER — METHOCARBAMOL 500 MG PO TABS: 500.0000 mg | ORAL_TABLET | Freq: Three times a day (TID) | ORAL | 0 refills | Status: DC | PRN

## 2020-10-27 MED ORDER — METHOCARBAMOL 500 MG PO TABS: 500.0000 mg | ORAL_TABLET | Freq: Two times a day (BID) | ORAL | 0 refills | Status: DC | PRN

## 2020-10-27 NOTE — ED Provider Notes (Signed)
[facility] EMERGENCY DEPARTMENT Provider Note   CSN: [id number] Arrival date & time: 10/27/20  1242     History Chief Complaint  Patient presents with  . Back Pain    Scott Ritter is a 60 y.o. male.  HPI Patient is a 60 year old male with a history of CVA and chronic left-sided deficits who ambulates with a cane at baseline.  He presents to the emergency department due to low back pain.  Started about 2 days ago.  Atraumatic.  States pain radiates down his bilateral lower extremities.  Seems to worsen when standing for long periods of time.  No numbness.  No bowel or bladder incontinence.  Patient lives with his mother.  No fevers, chills, abdominal pain, chest pain, shortness of breath.  No history of cancer.  No IVDA.     Past Medical History:  Diagnosis Date  . Hypertension   . Stroke [facility])    left side deficits    Patient Active Problem List   Diagnosis Date Noted  . Knee pain 11/15/2019  . Medicare annual wellness visit, subsequent 10/20/2019  . Nocturia 10/20/2019  . Malignant HTN with heart disease, w/o CHF, with chronic kidney disease 01/24/2019  . Chest pain in adult 01/24/2019  . Acute renal failure superimposed on stage 2 chronic kidney disease (HCC) 01/24/2019  . PSVT (paroxysmal supraventricular tachycardia) (HCC) 01/24/2019  . Elevated troponin 01/24/2019  . Iron deficiency anemia due to chronic blood loss   . Symptomatic anemia 02/18/2018  . Melena 02/18/2018  . History of Stroke 02/18/2018  . Leukocytosis 02/18/2018  . NSAID induced gastritis 02/18/2018  . Hyperlipidemia 02/18/2018  . Gastrointestinal hemorrhage with melena   . History of Mallory-Weiss tear 12/16/2016  . Hematemesis 12/16/2016  . HTN (hypertension) 12/16/2016  . Hypokalemia 12/16/2016  . Acute upper GI bleed   . Special screening for malignant neoplasms, colon     Past Surgical History:  Procedure Laterality Date  . BACK SURGERY    . BIOPSY  09/17/2018   Procedure:  BIOPSY;  Surgeon: West Bali, MD;  Location: [facility] ENDO SU[facility];  Service: Endoscopy;;  duodenal biopsy   . COLONOSCOPY N/A 02/29/2016   Dr. Darrick Penna: redundant colon, non-bleeding internal hemorrhoids   . ESOPHAGOGASTRODUODENOSCOPY N/A 12/17/2016   Dr. Darrick Penna: 3 mm nonbleeding Mallory-Weiss tear.  EGD performed for hematemesis.  Hemoglobin normal.  . ESOPHAGOGASTRODUODENOSCOPY N/A 02/19/2018   Dr. Jena Gauss: Performed for melena, hemoglobin of 6.  Mucosal changes in the esophagus query short segment Barrett's, biopsy more consistent with reflux changes.  Erosive gastropathy but no H. pylori.  Duodenal erosions.  Suspected NSAID induced injury.  . ESOPHAGOGASTRODUODENOSCOPY  08/18/2018   Dr. Karilyn Cota: IDA/heme positive stool.  Esophageal mucosal changes consistent with short segment Barrett's esophagus, not biopsied.  2 cm hiatal hernia.  Duodenal bulb, second portion of duodenum, third portion of the duodenum.  Video capsule somewhat difficult to pass through the oropharynx but was eventually advanced into the second portion of the duodenum and released.  . ESOPHAGOGASTRODUODENOSCOPY N/A 08/18/2018   Procedure: ESOPHAGOGASTRODUODENOSCOPY (EGD);  Surgeon: Malissa Hippo, MD;  Location: [facility];  Service: Endoscopy;  Laterality: N/A;  . ESOPHAGOGASTRODUODENOSCOPY N/A 09/17/2018   gastritis, 2 nonbleeding angiectasia's in the duodenum were treated with [facility] coagulation status post biopsy.  Marland Kitchen GIVENS CAPSULE STUDY N/A 08/18/2018   Procedure: GIVENS CAPSULE STUDY;  Surgeon: Malissa Hippo, MD;  Location: [facility] END[facility];  Service: Endoscopy;  Laterality: N/A;  . HERNIA REPAIR  Family History  Problem Relation Age of Onset  . Hypertension Mother   . Hypertension Father   . Colon cancer Neg Hx   . Colon polyps Neg Hx     Social History   Tobacco Use  . Smoking status: Never Smoker  . Smokeless tobacco: Never Used  Vaping Use  . Vaping Use: Never used  Substance Use Topics  . Alcohol use:  Yes    Alcohol/week: 1.0 standard drink    Types: 1 Cans of beer per week    Comment: occasionally; once a week or once a month  . Drug use: No    Home Medications Prior to Admission medications   Medication Sig Start Date End Date Taking? Authorizing Provider  predniSONE (STERAPRED UNI-PAK 21 TAB) 10 MG (21) TBPK tablet Take by mouth daily. Take 6 tabs by mouth daily  for 2 days, then 5 tabs for 2 days, then 4 tabs for 2 days, then 3 tabs for 2 days, 2 tabs for 2 days, then 1 tab by mouth daily for 2 days 10/27/20  Yes Placido Sou, PA-C  amLODipine (NORVASC) 10 MG tablet TAKE 1 TABLET EVERY DAY 08/06/20   Karilyn Cota, Nimish C, MD  atorvastatin (LIPITOR) 40 MG tablet TAKE 1 TABLET (40 MG TOTAL) BY MOUTH EVERY EVENING. 08/23/20   Elenore Paddy, NP  Cholecalciferol (VITAMIN D) 2000 units CAPS Take 8,000 Units by mouth daily.     [provider]  FEROSUL 325 (65 Fe) MG tablet TAKE 1 TABLET TWICE DAILY 08/14/20 11/12/20  Elenore Paddy, NP  hydrALAZINE (APRESOLINE) 100 MG tablet TAKE 1 TABLET THREE TIMES DAILY Patient taking differently: Take 100 mg by mouth 3 (three) times daily.  01/14/20 04/13/20  Wilson Singer, MD  losartan-hydrochlorothiazide (HYZAAR) 100-12.5 MG tablet TAKE 1 TABLET EVERY DAY 07/05/20   Lilly Cove C, MD  methocarbamol (ROBAXIN) 500 MG tablet Take 1 tablet (500 mg total) by mouth 2 (two) times daily as needed for muscle spasms. 10/27/20   Placido Sou, PA-C  metoprolol tartrate (LOPRESSOR) 25 MG tablet TAKE 1 TABLET TWICE DAILY 08/14/20 11/12/20  Elenore Paddy, NP  potassium chloride SA (KLOR-CON) 20 MEQ tablet Take 1 tablet (20 mEq total) by mouth 3 (three) times daily. 06/26/20   Elenore Paddy, NP    Allergies    Patient has no known allergies.  Review of Systems   Review of Systems  All other systems reviewed and are negative. Ten systems reviewed and are negative for acute change, except as noted in the HPI.    Physical Exam Updated Vital Signs BP (!)  153/73   Pulse (!) 56   Temp 98.2 F (36.8 C) (Oral)   Resp 20   Ht 5\' 7"  (1.702 m)   Wt 97.1 kg   SpO2 97%   BMI 33.52 kg/m   Physical Exam Vitals and nursing note reviewed.  Constitutional:      General: He is not in acute distress.    Appearance: Normal appearance. He is not ill-appearing, toxic-appearing or diaphoretic.  HENT:     Head: Normocephalic and atraumatic.     Right Ear: External ear normal.     Left Ear: External ear normal.     Nose: Nose normal.     Mouth/Throat:     Mouth: Mucous membranes are moist.     Pharynx: Oropharynx is clear. No oropharyngeal exudate or posterior oropharyngeal erythema.  Eyes:     Extraocular Movements: Extraocular movements intact.  Cardiovascular:     Rate and Rhythm: Normal rate and regular rhythm.     Pulses: Normal pulses.     Heart sounds: Normal heart sounds. No murmur heard. No friction rub. No gallop.   Pulmonary:     Effort: Pulmonary effort is normal. No respiratory distress.     Breath sounds: Normal breath sounds. No stridor. No wheezing, rhonchi or rales.  Abdominal:     General: Abdomen is flat.     Tenderness: There is no abdominal tenderness.  Musculoskeletal:        General: Tenderness present. Normal range of motion.     Cervical back: Normal range of motion and neck supple. No tenderness.     Comments: Mild TTP noted to the left lateral lumbar paraspinal musculature. No midline spine pain.   Skin:    General: Skin is warm and dry.  Neurological:     General: No focal deficit present.     Mental Status: He is alert and oriented to person, place, and time. Mental status is at baseline.     Comments: Mildly diminished strength noted in the left foot when compared to the right with plantar flexion and dorsiflexion.  Distal sensation intact.  Palpable pedal pulses.  Patient ambulatory with a cane.  History of left-sided weakness is chronic and secondary to prior CVA.  Psychiatric:        Mood and Affect: Mood  normal.        Behavior: Behavior normal.    ED Results / Procedures / Treatments   Labs (all labs ordered are listed, but only abnormal results are displayed) Labs Reviewed - No data to display  EKG None  Radiology DG Lumbar Spine Complete  Result Date: 10/27/2020 CLINICAL DATA:  Low back pain. EXAM: LUMBAR SPINE - COMPLETE 4+ VIEW COMPARISON:  July 14, 2013. FINDINGS: No fracture or spondylolisthesis is noted. Mild degenerative disc disease is noted at L4-5. Remaining disc spaces are unremarkable. IMPRESSION: Mild degenerative disc disease is noted at L4-5. No acute abnormality is noted. Electronically Signed   By: Lupita RaiderJames  Green Jr M.D.   On: 10/27/2020 16:29    Procedures Procedures (including critical care time)  Medications Ordered in ED Medications - No data to display  ED Course  I have reviewed the triage vital signs and the nursing notes.  Pertinent labs & imaging results that were available during my care of the patient were reviewed by me and considered in my medical decision making (see chart for details).  Clinical Course as of 10/27/20 1642  Sat Oct 27, 2020  1633 DG Lumbar Spine Complete IMPRESSION: Mild degenerative disc disease is noted at L4-5. No acute abnormality is noted. [LJ]    Clinical Course User Index [LJ] Placido SouJoldersma, Prue Lingenfelter, PA-C   MDM Rules/Calculators/A&P                          Patient is a 60 year old male who presents to the emergency department due to low back pain. No midline spine pain.  Chronic deficits in the left lower extremity.  Otherwise, neurovascularly intact in the lower extremities.  Patient is ambulatory.  He ambulates with a cane at baseline.  No numbness.  No saddle anesthesia.  No bowel or bladder incontinence.  No red flags.  Straight leg raise was negative but patient does endorse some tingling radiating down the lower extremities.  X-ray of the lumbar spine shows mild degenerative disc disease at L4-L5 but  otherwise no  abnormalities.  Will discharge on a course of Robaxin as well as prednisone taper.  No history of diabetes mellitus.  Per chart review, no significant history of hyperglycemia.  Recommended continued use of Tylenol and ibuprofen for management of his pain.  We discussed dosing.  Discussed return precautions.  PCP follow-up.  His questions were answered and he was amicable at the time of discharge.  Final Clinical Impression(s) / ED Diagnoses Final diagnoses:  Acute left-sided low back pain without sciatica   Rx / DC Orders ED Discharge Orders         Ordered    predniSONE (STERAPRED UNI-PAK 21 TAB) 10 MG (21) TBPK tablet  Daily        10/27/20 1641    methocarbamol (ROBAXIN) 500 MG tablet  Every 8 hours PRN,   Status:  Discontinued        10/27/20 1641    methocarbamol (ROBAXIN) 500 MG tablet  2 times daily PRN        10/27/20 1642           Placido Sou, PA-C 10/27/20 1644    Long, Arlyss Repress, MD 10/28/20 1549

## 2020-10-27 NOTE — Discharge Instructions (Addendum)
I recommend a combination of tylenol and ibuprofen for management of your pain. You can take a low dose of both at the same time. I recommend 325 mg of Tylenol combined with 400 mg of ibuprofen. This is one regular Tylenol and two regular ibuprofen. You can take these 2-3 times for day for your pain. Please try to take these medications with a small amount of food as well to prevent upsetting your stomach.  Also, please consider topical pain relieving creams such as Voltaran Gel, BioFreeze, or Icy Hot. There is also a pain relieving cream made by [employer]. You should be able to find all of these at your local pharmacy.   I am prescribing you a strong muscle relaxer called Robaxin. Please only take this medication twice per day as needed. This medication can make you quite drowsy. Do not mix it with alcohol. Do not drive a vehicle after taking it.   I am also prescribing you a steroid called prednisone.  Try to take this earlier in the day as it can have a stimulating effect and make it difficult to sleep.  This is a tapered medication so that you continue to take it you will take less and less on a daily basis.  Make sure that you complete this medication.  I have given you follow-up with orthopedics.  Their contact information is below.  Please give them a call if you find that your symptoms have not improved in 1 to 2 weeks.  You can always return to the emergency department with new or worsening symptoms.  It was a pleasure to meet you.

## 2020-10-27 NOTE — ED Triage Notes (Signed)
Pt to the ED with sharp back pain to his upper back that began today. Pt states he states he has tingling down his left leg, but denies trouble with bowel or bladder.

## 2020-10-31 ENCOUNTER — Other Ambulatory Visit (INDEPENDENT_AMBULATORY_CARE_PROVIDER_SITE_OTHER): Payer: Self-pay | Admitting: Internal Medicine

## 2020-10-31 DIAGNOSIS — I10 Essential (primary) hypertension: Secondary | ICD-10-CM

## 2020-11-06 ENCOUNTER — Other Ambulatory Visit: Payer: Self-pay

## 2020-11-06 ENCOUNTER — Encounter (INDEPENDENT_AMBULATORY_CARE_PROVIDER_SITE_OTHER): Payer: Self-pay | Admitting: Internal Medicine

## 2020-11-06 ENCOUNTER — Ambulatory Visit (INDEPENDENT_AMBULATORY_CARE_PROVIDER_SITE_OTHER): Payer: Medicare HMO | Admitting: Internal Medicine

## 2020-11-06 VITALS — BP 138/82 | HR 62 | Temp 97.7°F | Ht 67.0 in | Wt 213.4 lb

## 2020-11-06 DIAGNOSIS — D509 Iron deficiency anemia, unspecified: Secondary | ICD-10-CM

## 2020-11-06 DIAGNOSIS — E782 Mixed hyperlipidemia: Secondary | ICD-10-CM | POA: Diagnosis not present

## 2020-11-06 DIAGNOSIS — I10 Essential (primary) hypertension: Secondary | ICD-10-CM

## 2020-11-06 DIAGNOSIS — E876 Hypokalemia: Secondary | ICD-10-CM

## 2020-11-06 NOTE — Progress Notes (Signed)
Metrics: Intervention Frequency ACO  Documented Smoking Status Yearly  Screened one or more times in 24 months  Cessation Counseling or  Active cessation medication Past 24 months  Past 24 months   Guideline developer: UpToDate (See UpToDate for funding source) Date Released: 2014       Wellness Office Visit  Subjective:  Patient ID: Scott Ritter, male    DOB: 12/17/1960  Age: 60 y.o. MRN: [id number]  CC: This man comes in for follow-up of hypertension, hypokalemia, iron deficiency anemia and hyperlipidemia. HPI  He is doing well.  He was in the emergency room approximately 10 days ago with back pain.  This was atraumatic.  He was treated with Robaxin and steroids and seems to have improved completely now. He continues on amlodipine, Hyzaar for his hypertension and tolerating them well.  His potassium is always been an issue and he is now taking a higher dose of potassium. He continues with atorvastatin for his dyslipidemia.  Past Medical History:  Diagnosis Date  . Hypertension   . Stroke [facility])    left side deficits   Past Surgical History:  Procedure Laterality Date  . BACK SURGERY    . BIOPSY  09/17/2018   Procedure: BIOPSY;  Surgeon: West Bali, MD;  Location[facility];  Service: Endoscopy;;  duodenal biopsy   . COLONOSCOPY N/A 02/29/2016   Dr. Darrick Penna: redundant colon, non-bleeding internal hemorrhoids   . ESOPHAGOGASTRODUODENOSCOPY N/A 12/17/2016   Dr. Darrick Penna: 3 mm nonbleeding Mallory-Weiss tear.  EGD performed for hematemesis.  Hemoglobin normal.  . ESOPHAGOGASTRODUODENOSCOPY N/A 02/19/2018   Dr. Jena Gauss: Performed for melena, hemoglobin of 6.  Mucosal changes in the esophagus query short segment Barrett's, biopsy more consistent with reflux changes.  Erosive gastropathy but no H. pylori.  Duodenal erosions.  Suspected NSAID induced injury.  . ESOPHAGOGASTRODUODENOSCOPY  08/18/2018   Dr. Karilyn Cota: IDA/heme positive stool.  Esophageal mucosal changes consistent  with short segment Barrett's esophagus, not biopsied.  2 cm hiatal hernia.  Duodenal bulb, second portion of duodenum, third portion of the duodenum.  Video capsule somewhat difficult to pass through the oropharynx but was eventually advanced into the second portion of the duodenum and released.  . ESOPHAGOGASTRODUODENOSCOPY N/A 08/18/2018   Procedure: ESOPHAGOGASTRODUODENOSCOPY (EGD);  Surgeon: Malissa Hippo, MD;  [facility];  Service: Endoscopy;  Laterality: N/A;  . ESOPHAGOGASTRODUODENOSCOPY N/A 09/17/2018   gastritis, 2 nonbleeding angiectasia's in the duodenum were treated with [facility] coagulation status post biopsy.  Marland Kitchen GIVENS CAPSULE STUDY N/A 08/18/2018   Procedure: GIVENS CAPSULE STUDY;  Surgeon: Malissa Hippo, MD;  Location: [facility];  Service: Endoscopy;  Laterality: N/A;  . HERNIA REPAIR       Family History  Problem Relation Age of Onset  . Hypertension Mother   . Hypertension Father   . Colon cancer Neg Hx   . Colon polyps Neg Hx     Social History   Social History Narrative  . Not on file   Social History   Tobacco Use  . Smoking status: Never Smoker  . Smokeless tobacco: Never Used  Substance Use Topics  . Alcohol use: Yes    Alcohol/week: 1.0 standard drink    Types: 1 Cans of beer per week    Comment: occasionally; once a week or once a month    Current Meds  Medication Sig  . acetaminophen (TYLENOL) 500 MG tablet Take 500 mg by mouth as needed.  Marland Kitchen amLODipine (NORVASC) 10 MG  tablet TAKE 1 TABLET EVERY DAY  . atorvastatin (LIPITOR) 40 MG tablet TAKE 1 TABLET (40 MG TOTAL) BY MOUTH EVERY EVENING.  Marland Kitchen Cholecalciferol (VITAMIN D) 2000 units CAPS Take 8,000 Units by mouth daily.   . FEROSUL 325 (65 Fe) MG tablet TAKE 1 TABLET TWICE DAILY  . HYDROcodone-acetaminophen (NORCO/VICODIN) 5-325 MG tablet Take by mouth as needed.  Marland Kitchen losartan-hydrochlorothiazide (HYZAAR) 100-12.5 MG tablet TAKE 1 TABLET EVERY DAY  . methocarbamol (ROBAXIN) 500 MG tablet  Take 1 tablet (500 mg total) by mouth 2 (two) times daily as needed for muscle spasms.  . metoprolol tartrate (LOPRESSOR) 25 MG tablet TAKE 1 TABLET TWICE DAILY  . potassium chloride SA (KLOR-CON) 20 MEQ tablet Take 1 tablet (20 mEq total) by mouth 3 (three) times daily.  . predniSONE (STERAPRED UNI-PAK 21 TAB) 10 MG (21) TBPK tablet Take by mouth daily. Take 6 tabs by mouth daily  for 2 days, then 5 tabs for 2 days, then 4 tabs for 2 days, then 3 tabs for 2 days, 2 tabs for 2 days, then 1 tab by mouth daily for 2 days      Depression screen [facility] 2/9 11/06/2020 10/20/2019  Decreased Interest 0 0  Down, Depressed, Hopeless 0 0  PHQ - 2 Score 0 0  Altered sleeping 0 -  Tired, decreased energy 0 -  Change in appetite 0 -  Feeling bad or failure about yourself  0 -  Trouble concentrating 0 -  Moving slowly or fidgety/restless 0 -  Suicidal thoughts 0 -  PHQ-9 Score 0 -  Difficult doing work/chores Not difficult at all -     Objective:   Today's Vitals: BP 138/82   Pulse 62   Temp 97.7 F (36.5 C) (Temporal)   Ht 5\' 7"  (1.702 m)   Wt 213 lb 6.4 oz (96.8 kg)   SpO2 96%   BMI 33.42 kg/m  Vitals with BMI 11/06/2020 10/27/2020 10/27/2020  Height 5\' 7"  - 5\' 7"   Weight 213 lbs 6 oz - 214 lbs  BMI 33.42 - 33.51  Systolic 138 153 -  Diastolic 82 73 -  Pulse 62 56 -     Physical Exam  He looks systemically well.  Blood pressure under reasonably good control.  Alert and orientated without any focal neurologic signs.     Assessment   1. Hypokalemia   2. Iron deficiency anemia, unspecified iron deficiency anemia type   3. Essential hypertension   4. Mixed hyperlipidemia       Tests ordered Orders Placed This Encounter  Procedures  . CBC  . COMPLETE METABOLIC PANEL WITH GFR     Plan: 1. He will continue with all antihypertensive medications as before.  We will check renal function including potassium. 2. I will check a CBC to see where his hemoglobin is doing and should  be stable. 3. He will continue with atorvastatin for his hyperlipidemia as before. 4. Follow-up with 10/29/2020 in about 3 months time.   No orders of the defined types were placed in this encounter.   , MD

## 2020-11-07 LAB — COMPLETE METABOLIC PANEL WITH GFR
AG Ratio: 1.4 (calc) (ref 1.0–2.5)
ALT: 10 U/L (ref 9–46)
AST: 15 U/L (ref 10–35)
Albumin: 4.1 g/dL (ref 3.6–5.1)
Alkaline phosphatase (APISO): 66 U/L (ref 35–144)
BUN: 21 mg/dL (ref 7–25)
CO2: 27 mmol/L (ref 20–32)
Calcium: 9.6 mg/dL (ref 8.6–10.3)
Chloride: 103 mmol/L (ref 98–110)
Creat: 1.08 mg/dL (ref 0.70–1.33)
GFR, Est African American: 88 mL/min/{1.73_m2} (ref >=60)
GFR, Est Non African American: 76 mL/min/{1.73_m2} (ref >=60)
Globulin: 2.9 g/dL (calc) (ref 1.9–3.7)
Glucose, Bld: 98 mg/dL (ref 65–139)
Potassium: 3.7 mmol/L (ref 3.5–5.3)
Sodium: 140 mmol/L (ref 135–146)
Total Bilirubin: 0.4 mg/dL (ref 0.2–1.2)
Total Protein: 7 g/dL (ref 6.1–8.1)

## 2020-11-07 LAB — CBC
HCT: 38.7 % (ref 38.5–50.0)
Hemoglobin: 13 g/dL — ABNORMAL LOW (ref 13.2–17.1)
MCH: 28.6 pg (ref 27.0–33.0)
MCHC: 33.6 g/dL (ref 32.0–36.0)
MCV: 85.1 fL (ref 80.0–100.0)
MPV: 9.5 fL (ref 7.5–12.5)
Platelets: 390 10*3/uL (ref 140–400)
RBC: 4.55 10*6/uL (ref 4.20–5.80)
RDW: 14.4 % (ref 11.0–15.0)
WBC: 15.8 10*3/uL — ABNORMAL HIGH (ref 3.8–10.8)

## 2020-11-07 NOTE — Progress Notes (Signed)
Please call patient and tell him that his anemia is very stable and not getting worse. Follow-up as scheduled.

## 2020-11-22 ENCOUNTER — Other Ambulatory Visit (INDEPENDENT_AMBULATORY_CARE_PROVIDER_SITE_OTHER): Payer: Self-pay | Admitting: Internal Medicine

## 2020-11-22 NOTE — Telephone Encounter (Signed)
Kathie Rhodes, will you call this patient and ask if he takes this twice a day or three times a day? The prescription request is for twice a day, but per chart review his med list says three times a day. Hoping for some clarification. Thank you

## 2020-12-25 ENCOUNTER — Telehealth (INDEPENDENT_AMBULATORY_CARE_PROVIDER_SITE_OTHER): Payer: Self-pay | Admitting: Internal Medicine

## 2020-12-25 NOTE — Telephone Encounter (Signed)
This patient has a form that I am required to fill out regarding some sort of electrical stimulator.  He will need an appointment to further discuss.

## 2020-12-25 NOTE — Telephone Encounter (Signed)
Called patient and scheduled him for 12/31/2020 at 8am. Patient verbalized an understanding and thanked [place].

## 2020-12-31 ENCOUNTER — Other Ambulatory Visit (INDEPENDENT_AMBULATORY_CARE_PROVIDER_SITE_OTHER): Payer: Self-pay | Admitting: Internal Medicine

## 2020-12-31 ENCOUNTER — Other Ambulatory Visit: Payer: Self-pay

## 2020-12-31 ENCOUNTER — Encounter (INDEPENDENT_AMBULATORY_CARE_PROVIDER_SITE_OTHER): Payer: Self-pay | Admitting: Internal Medicine

## 2020-12-31 ENCOUNTER — Ambulatory Visit (INDEPENDENT_AMBULATORY_CARE_PROVIDER_SITE_OTHER): Payer: Medicare HMO | Admitting: Internal Medicine

## 2020-12-31 VITALS — BP 130/76 | HR 60 | Temp 97.5°F | Ht 67.0 in | Wt 216.0 lb

## 2020-12-31 DIAGNOSIS — D509 Iron deficiency anemia, unspecified: Secondary | ICD-10-CM

## 2020-12-31 DIAGNOSIS — M79672 Pain in left foot: Secondary | ICD-10-CM | POA: Diagnosis not present

## 2020-12-31 DIAGNOSIS — I10 Essential (primary) hypertension: Secondary | ICD-10-CM

## 2020-12-31 LAB — CBC
HCT: 37 % — ABNORMAL LOW (ref 38.5–50.0)
Hemoglobin: 12.6 g/dL — ABNORMAL LOW (ref 13.2–17.1)
MCH: 28.9 pg (ref 27.0–33.0)
MCHC: 34.1 g/dL (ref 32.0–36.0)
MCV: 84.9 fL (ref 80.0–100.0)
MPV: 9.6 fL (ref 7.5–12.5)
Platelets: 364 10*3/uL (ref 140–400)
RBC: 4.36 10*6/uL (ref 4.20–5.80)
RDW: 15 % (ref 11.0–15.0)
WBC: 8.7 10*3/uL (ref 3.8–10.8)

## 2020-12-31 NOTE — Progress Notes (Signed)
Metrics: Intervention Frequency ACO  Documented Smoking Status Yearly  Screened one or more times in 24 months  Cessation Counseling or  Active cessation medication Past 24 months  Past 24 months   Guideline developer: UpToDate (See UpToDate for funding source) Date Released: 2014       Wellness Office Visit  Subjective:  Patient ID: Scott Ritter, male    DOB: 12-29-1960  Age: 60 y.o. MRN: [id number]  CC: This man comes in for follow-up of hypertension, anemia and previous leukocytosis on the last blood work. HPI  He is doing reasonably well.  He is complaining of left heel pain.  He had seen a podiatrist previously but his pain is not improved despite therapy.  He wonders if he can be seen again. He continues on antihypertensive medications listed below.  His blood pressure is reasonably well controlled. He is trying to be more active. He denies any chest pain, dyspnea, palpitations or new limb weakness. Past Medical History:  Diagnosis Date  . Hypertension   . Stroke [facility])    left side deficits   Past Surgical History:  Procedure Laterality Date  . BACK SURGERY    . BIOPSY  09/17/2018   Procedure: BIOPSY;  Surgeon: West Bali, MD;  [facility];  Service: Endoscopy;;  duodenal biopsy   . COLONOSCOPY N/A 02/29/2016   Dr. Darrick Penna: redundant colon, non-bleeding internal hemorrhoids   . ESOPHAGOGASTRODUODENOSCOPY N/A 12/17/2016   Dr. Darrick Penna: 3 mm nonbleeding Mallory-Weiss tear.  EGD performed for hematemesis.  Hemoglobin normal.  . ESOPHAGOGASTRODUODENOSCOPY N/A 02/19/2018   Dr. Jena Gauss: Performed for melena, hemoglobin of 6.  Mucosal changes in the esophagus query short segment Barrett's, biopsy more consistent with reflux changes.  Erosive gastropathy but no H. pylori.  Duodenal erosions.  Suspected NSAID induced injury.  . ESOPHAGOGASTRODUODENOSCOPY  08/18/2018   Dr. Karilyn Cota: IDA/heme positive stool.  Esophageal mucosal changes consistent with short segment  Barrett's esophagus, not biopsied.  2 cm hiatal hernia.  Duodenal bulb, second portion of duodenum, third portion of the duodenum.  Video capsule somewhat difficult to pass through the oropharynx but was eventually advanced into the second portion of the duodenum and released.  . ESOPHAGOGASTRODUODENOSCOPY N/A 08/18/2018   Procedure: ESOPHAGOGASTRODUODENOSCOPY (EGD);  Surgeon: Malissa Hippo, MD;  [facility];  Service: Endoscopy;  Laterality: N/A;  . ESOPHAGOGASTRODUODENOSCOPY N/A 09/17/2018   gastritis, 2 nonbleeding angiectasia's in the duodenum were treated with [facility] coagulation status post biopsy.  Marland Kitchen GIVENS CAPSULE STUDY N/A 08/18/2018   Procedure: GIVENS CAPSULE STUDY;  Surgeon: Malissa Hippo, MD;  [facility];  Service: Endoscopy;  Laterality: N/A;  . HERNIA REPAIR       Family History  Problem Relation Age of Onset  . Hypertension Mother   . Hypertension Father   . Colon cancer Neg Hx   . Colon polyps Neg Hx     Social History   Social History Narrative  . Not on file   Social History   Tobacco Use  . Smoking status: Never Smoker  . Smokeless tobacco: Never Used  Substance Use Topics  . Alcohol use: Yes    Alcohol/week: 1.0 standard drink    Types: 1 Cans of beer per week    Comment: occasionally; once a week or once a month    Current Meds  Medication Sig  . acetaminophen (TYLENOL) 500 MG tablet Take 500 mg by mouth as needed.  Marland Kitchen amLODipine (NORVASC) 10 MG tablet TAKE  1 TABLET EVERY DAY  . atorvastatin (LIPITOR) 40 MG tablet TAKE 1 TABLET (40 MG TOTAL) BY MOUTH EVERY EVENING.  Marland Kitchen Cholecalciferol (VITAMIN D) 2000 units CAPS Take 8,000 Units by mouth daily.   Marland Kitchen HYDROcodone-acetaminophen (NORCO/VICODIN) 5-325 MG tablet Take by mouth as needed.  Marland Kitchen losartan-hydrochlorothiazide (HYZAAR) 100-12.5 MG tablet TAKE 1 TABLET EVERY DAY  . methocarbamol (ROBAXIN) 500 MG tablet Take 1 tablet (500 mg total) by mouth 2 (two) times daily as needed for muscle  spasms.  . potassium chloride SA (KLOR-CON) 20 MEQ tablet TAKE 1 TABLET TWICE DAILY     Flowsheet Row Office Visit from 11/06/2020 in [facility]  PHQ-9 Total Score 0      Objective:   Today's Vitals: BP 130/76   Pulse 60   Temp (!) 97.5 F (36.4 C) (Temporal)   Ht 5\' 7"  (1.702 m)   Wt 216 lb (98 kg)   SpO2 99%   BMI 33.83 kg/m  Vitals with BMI 12/31/2020 11/06/2020 10/27/2020  Height 5\' 7"  5\' 7"  -  Weight 216 lbs 213 lbs 6 oz -  BMI 33.82 33.42 -  Systolic 130 138 10/29/2020  Diastolic 76 82 73  Pulse 60 62 56     Physical Exam  He looks systemically well.  He remains obese and has gained a few pounds in weight.  Blood pressure is under good control.  No new focal neurological signs.     Assessment   1. Iron deficiency anemia, unspecified iron deficiency anemia type   2. Essential hypertension   3. Pain of left heel       Tests ordered Orders Placed This Encounter  Procedures  . CBC  . Ambulatory referral to Podiatry     Plan: 1. He will continue with antihypertensive medications above. 2. We will repeat his CBC to look at his leukocytosis and make sure it is improving. 3. I will refer him back to the podiatrist again for the left heel pain. 4. Follow-up with Sarah in about 3 months   No orders of the defined types were placed in this encounter.   , MD

## 2021-01-01 ENCOUNTER — Encounter: Payer: Self-pay | Admitting: Internal Medicine

## 2021-01-15 ENCOUNTER — Ambulatory Visit: Payer: Medicare HMO | Admitting: Podiatry

## 2021-01-18 ENCOUNTER — Other Ambulatory Visit: Payer: Self-pay

## 2021-01-18 ENCOUNTER — Ambulatory Visit: Payer: Self-pay

## 2021-01-18 ENCOUNTER — Ambulatory Visit: Payer: Medicare HMO | Admitting: Podiatry

## 2021-01-18 DIAGNOSIS — G5762 Lesion of plantar nerve, left lower limb: Secondary | ICD-10-CM

## 2021-01-18 DIAGNOSIS — M722 Plantar fascial fibromatosis: Secondary | ICD-10-CM

## 2021-01-30 NOTE — Progress Notes (Addendum)
Subjective:   Scott Ritter is a 60 y.o. male who presents for an Initial Medicare Annual Wellness Visit.  I connected with Scott Ritter today by telephone and verified that I am speaking with the correct person using two identifiers. Location patient: home Location provider: work Persons participating in the virtual visit: patient, provider.   I discussed the limitations, risks, security and privacy concerns of performing an evaluation and management service by telephone and the availability of in person appointments. I also discussed with the patient that there may be a patient responsible charge related to this service. The patient expressed understanding and verbally consented to this telephonic visit.    Interactive audio and video telecommunications were attempted between this provider and patient, however failed, due to patient having technical difficulties OR patient did not have access to video capability.  We continued and completed visit with audio only.      Review of Systems    N/A  Cardiac Risk Factors include: advanced age (>39men, >33 women);male gender;dyslipidemia;hypertension     Objective:    There were no vitals filed for this visit. There is no height or weight on file to calculate BMI.  Advanced Directives 01/31/2021 10/27/2020 10/20/2019 01/24/2019 01/24/2019 09/17/2018 08/17/2018  Does Patient Have a Medical Advance Directive? No No No No No No No  Would patient like information on creating a medical advance directive? No - Patient declined No - Patient declined Yes (MAU/Ambulatory/Procedural Areas - Information given) No - Patient declined No - Patient declined No - Patient declined No - Patient declined    Current Medications (verified) Outpatient Encounter Medications as of 01/31/2021  Medication Sig   acetaminophen (TYLENOL) 500 MG tablet Take 500 mg by mouth as needed.   amLODipine (NORVASC) 10 MG tablet TAKE 1 TABLET EVERY DAY   atorvastatin  (LIPITOR) 40 MG tablet TAKE 1 TABLET (40 MG TOTAL) BY MOUTH EVERY EVENING.   Cholecalciferol (VITAMIN D) 2000 units CAPS Take 8,000 Units by mouth daily.    hydrALAZINE (APRESOLINE) 100 MG tablet TAKE 1 TABLET THREE TIMES DAILY (Patient taking differently: Take 100 mg by mouth 3 (three) times daily.)   HYDROcodone-acetaminophen (NORCO/VICODIN) 5-325 MG tablet Take by mouth as needed.   losartan-hydrochlorothiazide (HYZAAR) 100-12.5 MG tablet TAKE 1 TABLET EVERY DAY   metoprolol tartrate (LOPRESSOR) 25 MG tablet TAKE 1 TABLET TWICE DAILY   potassium chloride SA (KLOR-CON) 20 MEQ tablet TAKE 1 TABLET TWICE DAILY   FEROSUL 325 (65 Fe) MG tablet TAKE 1 TABLET TWICE DAILY (Patient not taking: Reported on 01/31/2021)   methocarbamol (ROBAXIN) 500 MG tablet Take 1 tablet (500 mg total) by mouth 2 (two) times daily as needed for muscle spasms. (Patient not taking: Reported on 01/31/2021)   No facility-administered encounter medications on file as of 01/31/2021.    Allergies (verified) Patient has no known allergies.   History: Past Medical History:  Diagnosis Date   Hypertension    Stroke [facility])    left side deficits   Past Surgical History:  Procedure Laterality Date   BACK SURGERY     BIOPSY  09/17/2018   Procedure: BIOPSY;  Surgeon: West Bali, MD;  [facility];  Service: Endoscopy;;  duodenal biopsy    COLONOSCOPY N/A 02/29/2016   Dr. Darrick Penna: redundant colon, non-bleeding internal hemorrhoids    ESOPHAGOGASTRODUODENOSCOPY N/A 12/17/2016   Dr. Darrick Penna: 3 mm nonbleeding Mallory-Weiss tear.  EGD performed for hematemesis.  Hemoglobin normal.   ESOPHAGOGASTRODUODENOSCOPY N/A 02/19/2018   Dr.  Rourk: Performed for melena, hemoglobin of 6.  Mucosal changes in the esophagus query short segment Barrett's, biopsy more consistent with reflux changes.  Erosive gastropathy but no H. pylori.  Duodenal erosions.  Suspected NSAID induced injury.   ESOPHAGOGASTRODUODENOSCOPY  08/18/2018   Dr.  Karilyn Cota: IDA/heme positive stool.  Esophageal mucosal changes consistent with short segment Barrett's esophagus, not biopsied.  2 cm hiatal hernia.  Duodenal bulb, second portion of duodenum, third portion of the duodenum.  Video capsule somewhat difficult to pass through the oropharynx but was eventually advanced into the second portion of the duodenum and released.   ESOPHAGOGASTRODUODENOSCOPY N/A 08/18/2018   Procedure: ESOPHAGOGASTRODUODENOSCOPY (EGD);  Surgeon: Malissa Hippo, MD;  [facility];  Service: Endoscopy;  Laterality: N/A;   ESOPHAGOGASTRODUODENOSCOPY N/A 09/17/2018   gastritis, 2 nonbleeding angiectasia's in the duodenum were treated with [facility] coagulation status post biopsy.   GIVENS CAPSULE STUDY N/A 08/18/2018   Procedure: GIVENS CAPSULE STUDY;  Surgeon: Malissa Hippo, MD;  [facility];  Service: Endoscopy;  Laterality: N/A;   HERNIA REPAIR     Family History  Problem Relation Age of Onset   Hypertension Mother    Hypertension Father    Colon cancer Neg Hx    Colon polyps Neg Hx    Social History   Socioeconomic History   Marital status: Married    Spouse name: Not on file   Number of children: Not on file   Years of education: Not on file   Highest education level: Not on file  Occupational History   Not on file  Tobacco Use   Smoking status: Never Smoker   Smokeless tobacco: Never Used  Vaping Use   Vaping Use: Never used  Substance and Sexual Activity   Alcohol use: Yes    Alcohol/week: 1.0 standard drink    Types: 1 Cans of beer per week    Comment: occasionally; once a week or once a month   Drug use: No   Sexual activity: Not on file  Other Topics Concern   Not on file  Social History Narrative   Not on file   Social Determinants of Health   Financial Resource Strain: Low Risk    Difficulty of Paying Living Expenses: Not hard at all  Food Insecurity: No Food Insecurity   Worried About Programme researcher, broadcasting/film/video in the Last Year:  Never true   Ran Out of Food in the Last Year: Never true  Transportation Needs: No Transportation Needs   Lack of Transportation (Medical): No   Lack of Transportation (Non-Medical): No  Physical Activity: Insufficiently Active   Days of Exercise per Week: 3 days   Minutes of Exercise per Session: 30 min  Stress: No Stress Concern Present   Feeling of Stress : Not at all  Social Connections: Socially Integrated   Frequency of Communication with Friends and Family: More than three times a week   Frequency of Social Gatherings with Friends and Family: More than three times a week   Attends Religious Services: More than 4 times per year   Active Member of [employer] or Organizations: Yes   Attends Engineer, structural: More than 4 times per year   Marital Status: Married    Tobacco Counseling Counseling given: Not Answered   Clinical Intake:  Pre-visit preparation completed: Yes  Pain : No/denies pain     Nutritional Risks: None Diabetes: No  How often do you need to have someone help  you when you read instructions, pamphlets, or other written materials from your doctor or pharmacy?: 1 - Never  Diabetic?No  Interpreter Needed?: No  Information entered by :: SCrews,LPN   Activities of Daily Living In your present state of health, do you have any difficulty performing the following activities: 01/31/2021  Hearing? N  Vision? N  Difficulty concentrating or making decisions? N  Walking or climbing stairs? N  Dressing or bathing? N  Doing errands, shopping? N  Preparing Food and eating ? N  Using the Toilet? N  In the past six months, have you accidently leaked urine? N  Do you have problems with loss of bowel control? N  Managing your Medications? N  Managing your Finances? N  Housekeeping or managing your Housekeeping? N  Some recent data might be hidden    Patient Care Team: Elenore PaddyGray, Sarah E, NP as PCP - General (Nurse Practitioner) West BaliFields, Sandi L, MD  (Inactive) as Consulting Physician (Gastroenterology) Randa LynnBhutani, Manpreet S, MD as Consulting Physician (Nephrology)  Indicate any recent Medical Services you may have received from other than [facility] providers in the past year (date may be approximate).     Assessment:   This is a routine wellness examination for Jazier.  Hearing/Vision screen  Hearing Screening   125Hz  250Hz  500Hz  1000Hz  2000Hz  3000Hz  4000Hz  6000Hz  8000Hz   Right ear:           Left ear:           Vision Screening Comments: Patient states that he has not had an eye exam in several years. Wears over the counter reading glasses   Dietary issues and exercise activities discussed: Current Exercise Habits: Structured exercise class, Time (Minutes): 30, Frequency (Times/Week): 3, Weekly Exercise (Minutes/Week): 90, Intensity: Mild, Exercise limited by: None identified  Goals      Exercise 3x per week (30 min per time)     [facility]obics 2-3 times per week      Patient Stated     I would like for my foot pain under control so that I can walk longer distances      Weight (lb) < 200 lb (90.7 kg)     Plans on trying to start exercising again once he is released from foot surgeon       Depression Screen PHQ 2/9 Scores 01/31/2021 11/06/2020 10/20/2019  PHQ - 2 Score 0 0 0  PHQ- 9 Score - 0 -    Fall Risk Fall Risk  01/31/2021 10/20/2019  Falls in the past year? 0 0  Number falls in past yr: 0 0  Injury with Fall? 0 0  Risk for fall due to : No Fall Risks -  Follow up Falls evaluation completed;Falls prevention discussed Falls prevention discussed    FALL RISK PREVENTION PERTAINING TO THE HOME:  Any stairs in or around the home? Yes  If so, are there any without handrails? No  Home free of loose throw rugs in walkways, pet beds, electrical cords, etc? Yes  Adequate lighting in your home to reduce risk of falls? Yes   ASSISTIVE DEVICES UTILIZED TO PREVENT FALLS:  Life alert? No  Use of a  cane, walker or w/c? Yes  Grab bars in the bathroom? No  Shower chair or bench in shower? No  Elevated toilet seat or a handicapped toilet? No     Cognitive Function:   Normal cognitive status assessed by direct observation by this Nurse Health Advisor. No abnormalities  found.     6CIT Screen 10/20/2019  What Year? 0 points  What month? 0 points  What time? 0 points  Count back from 20 0 points  Months in reverse 4 points  Repeat phrase 4 points  Total Score 8    Immunizations Immunization History  Administered Date(s) Administered   Influenza,inj,Quad PF,6+ Mos 12/17/2016, 08/24/2019, 08/07/2020   Janssen (J&J) SARS-COV-2 Vaccination 01/23/2020   Moderna Sars-Covid-2 Vaccination 07/24/2020   Pneumococcal Conjugate-13 08/16/2018    TDAP status: Due, Education has been provided regarding the importance of this vaccine. Advised may receive this vaccine at local pharmacy or Health Dept. Aware to provide a copy of the vaccination record if obtained from local pharmacy or Health Dept. Verbalized acceptance and understanding.  Flu Vaccine status: Up to date  Pneumococcal vaccine status: Up to date  Covid-19 vaccine status: Completed vaccines  Qualifies for Shingles Vaccine? Yes   Zostavax completed No   Shingrix Completed?: No.    Education has been provided regarding the importance of this vaccine. Patient has been advised to call insurance company to determine out of pocket expense if they have not yet received this vaccine. Advised may also receive vaccine at local pharmacy or Health Dept. Verbalized acceptance and understanding.  Screening Tests Health Maintenance  Topic Date Due   TETANUS/TDAP  Never done   INFLUENZA VACCINE  05/13/2021   COLONOSCOPY (Pts 45-61yrs Insurance coverage will need to be confirmed)  02/28/2026   COVID-19 Vaccine  Completed   Hepatitis C Screening  Completed   HIV Screening  Completed   HPV VACCINES  Aged Out    Health  Maintenance  Health Maintenance Due  Topic Date Due   TETANUS/TDAP  Never done    Colorectal cancer screening: Type of screening: Colonoscopy. Completed 02/29/2016. Repeat every 10 years  Lung Cancer Screening: (Low Dose CT Chest recommended if Age 70-80 years, 30 pack-year currently smoking OR have quit w/in 15years.) does not qualify.   Lung Cancer Screening Referral: N/A  Additional Screening:  Hepatitis C Screening: does qualify; Completed 08/07/2020  Vision Screening: Recommended annual ophthalmology exams for early detection of glaucoma and other disorders of the eye. Is the patient up to date with their annual eye exam?  No  Who is the provider or what is the name of the office in which the patient attends annual eye exams? Does not have a current eye doctor  If pt is not established with a provider, would they like to be referred to a provider to establish care? No .   Dental Screening: Recommended annual dental exams for proper oral hygiene  Community Resource Referral / Chronic Care Management: CRR required this visit?  No   CCM required this visit?  No      Plan:   May consider offering tetanus booster at next office visit.   I have personally reviewed and noted the following in the patient's chart:   Medical and social history Use of alcohol, tobacco or illicit drugs  Current medications and supplements Functional ability and status Nutritional status Physical activity Advanced directives List of other physicians Hospitalizations, surgeries, and ER visits in previous 12 months Vitals Screenings to include cognitive, depression, and falls Referrals and appointments  In addition, I have reviewed and discussed with patient certain preventive protocols, quality metrics, and best practice recommendations. A written personalized care plan for preventive services as well as general preventive health recommendations were provided to patient.    Patient will  follow-up as  scheduled in June or sooner as needed.   Theodora Blow, LPN   01/19/8118   Nurse Notes: None

## 2021-01-31 ENCOUNTER — Ambulatory Visit (INDEPENDENT_AMBULATORY_CARE_PROVIDER_SITE_OTHER): Payer: Medicare HMO

## 2021-01-31 ENCOUNTER — Other Ambulatory Visit: Payer: Self-pay

## 2021-01-31 ENCOUNTER — Encounter (INDEPENDENT_AMBULATORY_CARE_PROVIDER_SITE_OTHER): Payer: Self-pay

## 2021-01-31 DIAGNOSIS — Z0100 Encounter for examination of eyes and vision without abnormal findings: Secondary | ICD-10-CM

## 2021-01-31 DIAGNOSIS — Z0000 Encounter for general adult medical examination without abnormal findings: Secondary | ICD-10-CM

## 2021-01-31 NOTE — Patient Instructions (Signed)
Scott Ritter , Thank you for taking time to come for your Medicare Wellness Visit. I appreciate your ongoing commitment to your health goals. Please review the following plan we discussed and let me know if I can assist you in the future.   Screening recommendations/referrals: Colonoscopy: Up to date, next due 02/28/2026 Recommended yearly ophthalmology/optometry visit for glaucoma screening and checkup Recommended yearly dental visit for hygiene and checkup  Vaccinations: Influenza vaccine: Up to date, next due fall 2022  Pneumococcal vaccine: Up to date, next due at age 58 Tdap vaccine: Currently due, you may await and injury or we recommend that you receive at your local pharmacy. Shingles vaccine: Currently due for Shingrix, if you would like to receive you may do so at your local pharmacy.    Advanced directives: Advance directive discussed with you today. Even though you declined this today please call our office should you change your mind and we can give you the proper paperwork for you to fill out.   Conditions/risks identified: None   Next appointment: 02/06/2022 @ 8:15 am with Nurse Health Advisor   Preventive Care 40-64 Years, Male Preventive care refers to lifestyle choices and visits with your health care provider that can promote health and wellness. What does preventive care include?  A yearly physical exam. This is also called an annual well check.  Dental exams once or twice a year.  Routine eye exams. Ask your health care provider how often you should have your eyes checked.  Personal lifestyle choices, including:  Daily care of your teeth and gums.  Regular physical activity.  Eating a healthy diet.  Avoiding tobacco and drug use.  Limiting alcohol use.  Practicing safe sex.  Taking low-dose aspirin every day starting at age 58. What happens during an annual well check? The services and screenings done by your health care provider during your annual  well check will depend on your age, overall health, lifestyle risk factors, and family history of disease. Counseling  Your health care provider may ask you questions about your:  Alcohol use.  Tobacco use.  Drug use.  Emotional well-being.  Home and relationship well-being.  Sexual activity.  Eating habits.  Work and work Astronomer. Screening  You may have the following tests or measurements:  Height, weight, and BMI.  Blood pressure.  Lipid and cholesterol levels. These may be checked every 5 years, or more frequently if you are over 50 years old.  Skin check.  Lung cancer screening. You may have this screening every year starting at age 58 if you have a 30-pack-year history of smoking and currently smoke or have quit within the past 15 years.  Fecal occult blood test (FOBT) of the stool. You may have this test every year starting at age 58.  Flexible sigmoidoscopy or colonoscopy. You may have a sigmoidoscopy every 5 years or a colonoscopy every 10 years starting at age 58.  Prostate cancer screening. Recommendations will vary depending on your family history and other risks.  Hepatitis C blood test.  Hepatitis B blood test.  Sexually transmitted disease (STD) testing.  Diabetes screening. This is done by checking your blood sugar (glucose) after you have not eaten for a while (fasting). You may have this done every 1-3 years. Discuss your test results, treatment options, and if necessary, the need for more tests with your health care provider. Vaccines  Your health care provider may recommend certain vaccines, such as:  Influenza vaccine. This is recommended every year.  Tetanus, diphtheria, and acellular pertussis (Tdap, Td) vaccine. You may need a Td booster every 10 years.  Zoster vaccine. You may need this after age 58.  Pneumococcal 13-valent conjugate (PCV13) vaccine. You may need this if you have certain conditions and have not been  vaccinated.  Pneumococcal polysaccharide (PPSV23) vaccine. You may need one or two doses if you smoke cigarettes or if you have certain conditions. Talk to your health care provider about which screenings and vaccines you need and how often you need them. This information is not intended to replace advice given to you by your health care provider. Make sure you discuss any questions you have with your health care provider. Document Released: 10/26/2015 Document Revised: 06/18/2016 Document Reviewed: 07/31/2015 Elsevier Interactive Patient Education  2017 [place] Prevention in the Home Falls can cause injuries. They can happen to people of all ages. There are many things you can do to make your home safe and to help prevent falls. What can I do on the outside of my home?  Regularly fix the edges of walkways and driveways and fix any cracks.  Remove anything that might make you trip as you walk through a door, such as a raised step or threshold.  Trim any bushes or trees on the path to your home.  Use bright outdoor lighting.  Clear any walking paths of anything that might make someone trip, such as rocks or tools.  Regularly check to see if handrails are loose or broken. Make sure that both sides of any steps have handrails.  Any raised decks and porches should have guardrails on the edges.  Have any leaves, snow, or ice cleared regularly.  Use sand or salt on walking paths during winter.  Clean up any spills in your garage right away. This includes oil or grease spills. What can I do in the bathroom?  Use night lights.  Install grab bars by the toilet and in the tub and shower. Do not use towel bars as grab bars.  Use non-skid mats or decals in the tub or shower.  If you need to sit down in the shower, use a plastic, non-slip stool.  Keep the floor dry. Clean up any water that spills on the floor as soon as it happens.  Remove soap buildup in the tub or shower  regularly.  Attach bath mats securely with double-sided non-slip rug tape.  Do not have throw rugs and other things on the floor that can make you trip. What can I do in the bedroom?  Use night lights.  Make sure that you have a light by your bed that is easy to reach.  Do not use any sheets or blankets that are too big for your bed. They should not hang down onto the floor.  Have a firm chair that has side arms. You can use this for support while you get dressed.  Do not have throw rugs and other things on the floor that can make you trip. What can I do in the kitchen?  Clean up any spills right away.  Avoid walking on wet floors.  Keep items that you use a lot in easy-to-reach places.  If you need to reach something above you, use a strong step stool that has a grab bar.  Keep electrical cords out of the way.  Do not use floor polish or wax that makes floors slippery. If you must use wax, use non-skid floor wax.  Do not have  throw rugs and other things on the floor that can make you trip. What can I do with my stairs?  Do not leave any items on the stairs.  Make sure that there are handrails on both sides of the stairs and use them. Fix handrails that are broken or loose. Make sure that handrails are as long as the stairways.  Check any carpeting to make sure that it is firmly attached to the stairs. Fix any carpet that is loose or worn.  Avoid having throw rugs at the top or bottom of the stairs. If you do have throw rugs, attach them to the floor with carpet tape.  Make sure that you have a light switch at the top of the stairs and the bottom of the stairs. If you do not have them, ask someone to add them for you. What else can I do to help prevent falls?  Wear shoes that:  Do not have high heels.  Have rubber bottoms.  Are comfortable and fit you well.  Are closed at the toe. Do not wear sandals.  If you use a stepladder:  Make sure that it is fully  opened. Do not climb a closed stepladder.  Make sure that both sides of the stepladder are locked into place.  Ask someone to hold it for you, if possible.  Clearly mark and make sure that you can see:  Any grab bars or handrails.  First and last steps.  Where the edge of each step is.  Use tools that help you move around (mobility aids) if they are needed. These include:  Canes.  Walkers.  Scooters.  Crutches.  Turn on the lights when you go into a dark area. Replace any light bulbs as soon as they burn out.  Set up your furniture so you have a clear path. Avoid moving your furniture around.  If any of your floors are uneven, fix them.  If there are any pets around you, be aware of where they are.  Review your medicines with your doctor. Some medicines can make you feel dizzy. This can increase your chance of falling. Ask your doctor what other things that you can do to help prevent falls. This information is not intended to replace advice given to you by your health care provider. Make sure you discuss any questions you have with your health care provider. Document Released: 07/26/2009 Document Revised: 03/06/2016 Document Reviewed: 11/03/2014 Elsevier Interactive Patient Education  2017 [employer].

## 2021-02-06 ENCOUNTER — Ambulatory Visit (INDEPENDENT_AMBULATORY_CARE_PROVIDER_SITE_OTHER): Payer: Medicare HMO | Admitting: Nurse Practitioner

## 2021-02-13 NOTE — Progress Notes (Signed)
  Subjective:  Patient ID: Scott Ritter, male    DOB: 08/07/1961,  MRN: [id number]  Chief Complaint  Patient presents with  . Neuroma    Left foot neuroma pain   60 y.o. male presents with the above complaint. History confirmed with patient.  States the pain feels like the pain that he had before and the injections helped.  Objective:  Physical Exam: warm, good capillary refill, no trophic changes or ulcerative lesions, normal DP and PT pulses and normal sensory exam. Left Foot: tenderness between the 3rd and 4th metatarsal head.  Assessment:   1. Plantar fasciitis of left foot    Plan:  Patient was evaluated and treated and all questions answered.  Interdigital Neuroma  -Restart sclerosing injections today  Procedure: Neurolysis Location: left 3rd interspace Skin Prep: Alcohol. Injectate: 4% alcohol sclerosing injection. Disposition: Patient tolerated procedure well. Injection site dressed with a band-aid.   No follow-ups on file.

## 2021-02-15 ENCOUNTER — Other Ambulatory Visit (INDEPENDENT_AMBULATORY_CARE_PROVIDER_SITE_OTHER): Payer: Self-pay | Admitting: Internal Medicine

## 2021-02-15 ENCOUNTER — Other Ambulatory Visit (INDEPENDENT_AMBULATORY_CARE_PROVIDER_SITE_OTHER): Payer: Self-pay | Admitting: Nurse Practitioner

## 2021-02-15 DIAGNOSIS — I10 Essential (primary) hypertension: Secondary | ICD-10-CM

## 2021-02-18 NOTE — Progress Notes (Signed)
Primary Care Physician:  Elenore Paddy, NP  Primary GI: Dr. Marletta Lor, previously Dr. Darrick Penna  Patient Location: Home   Provider Location: [facility] office   Reason for Visit: Follow-up   Persons present on the virtual encounter, with roles: Patient and NP   Total time (minutes) spent on medical discussion: 10 minutes   Due to COVID-19, visit was conducted using virtual method.  Visit was requested by patient.  Virtual Visit via MyChart Video Note, converted to telephone visit due to technical difficulties.  Due to COVID-19, visit is conducted virtually and was requested by patient.   I connected with Scott Ritter on 02/19/21 at  8:00 AM EDT by telephone and verified that I am speaking with the correct person using two identifiers.   I discussed the limitations, risks, security and privacy concerns of performing an evaluation and management service by telephone and the availability of in person appointments. I also discussed with the patient that there may be a patient responsible charge related to this service. The patient expressed understanding and agreed to proceed.  Chief Complaint  Patient presents with  . Anemia    Doing ok. Stopped Iron      History of Present Illness: Scott Ritter is a 60 year old male presenting with a history of IDA, now resolved. Hospitalization in 2019 with GI bleed requiring transfusion, with capsule study noting AVM, single non-bleeding erosions, possible ulcer in small bowel in setting of NSAIDs. EGD/push enteroscopy found gastritis, 2 nonbleeding angiectasia's in the duodenum were treated with APC coagulation status post biopsy. Returning for 1 year follow-up. He had question of Barrett's in the past but biopsies consistent with reflux changes.   March 2022 Hgb 12.6. He has no overt GI bleeding. No melena or hematochezia. No abdominal pain, no constipation or diarrhea. No changes in appetite, weight loss. No dysphagia. He is completely  avoiding NSAIDs. He tells me that his father had history of colon cancer. Patient's last colonoscopy in 2017 without polyps.   Past Medical History:  Diagnosis Date  . Hypertension   . Stroke [facility])    left side deficits     Past Surgical History:  Procedure Laterality Date  . BACK SURGERY    . BIOPSY  09/17/2018   Procedure: BIOPSY;  Surgeon: West Bali, MD;  Location: [facility];  Service: Endoscopy;;  duodenal biopsy   . COLONOSCOPY N/A 02/29/2016   Dr. Darrick Penna: redundant colon, non-bleeding internal hemorrhoids   . ESOPHAGOGASTRODUODENOSCOPY N/A 12/17/2016   Dr. Darrick Penna: 3 mm nonbleeding Mallory-Weiss tear.  EGD performed for hematemesis.  Hemoglobin normal.  . ESOPHAGOGASTRODUODENOSCOPY N/A 02/19/2018   Dr. Jena Gauss: Performed for melena, hemoglobin of 6.  Mucosal changes in the esophagus query short segment Barrett's, biopsy more consistent with reflux changes.  Erosive gastropathy but no H. pylori.  Duodenal erosions.  Suspected NSAID induced injury.  . ESOPHAGOGASTRODUODENOSCOPY  08/18/2018   Dr. Karilyn Cota: IDA/heme positive stool.  Esophageal mucosal changes consistent with short segment Barrett's esophagus, not biopsied.  2 cm hiatal hernia.  Duodenal bulb, second portion of duodenum, third portion of the duodenum.  Video capsule somewhat difficult to pass through the oropharynx but was eventually advanced into the second portion of the duodenum and released.  . ESOPHAGOGASTRODUODENOSCOPY N/A 08/18/2018   Procedure: ESOPHAGOGASTRODUODENOSCOPY (EGD);  Surgeon: Malissa Hippo, MD;  Location: [facility];  Service: Endoscopy;  Laterality: N/A;  . ESOPHAGOGASTRODUODENOSCOPY N/A 09/17/2018   gastritis, 2 nonbleeding angiectasia's in the duodenum  were treated with APC coagulation status post biopsy.  Marland Kitchen GIVENS CAPSULE STUDY N/A 08/18/2018   Procedure: GIVENS CAPSULE STUDY;  Surgeon: Malissa Hippo, MD;  Location: [facility];  Service: Endoscopy;  Laterality: N/A;  . HERNIA REPAIR        Current Meds  Medication Sig  . acetaminophen (TYLENOL) 500 MG tablet Take 500 mg by mouth as needed.  Marland Kitchen amLODipine (NORVASC) 10 MG tablet TAKE 1 TABLET EVERY DAY  . atorvastatin (LIPITOR) 40 MG tablet TAKE 1 TABLET (40 MG TOTAL) BY MOUTH EVERY EVENING.  Marland Kitchen Cholecalciferol (VITAMIN D) 2000 units CAPS Take 8,000 Units by mouth daily.   . hydrALAZINE (APRESOLINE) 100 MG tablet TAKE 1 TABLET THREE TIMES DAILY (Patient taking differently: Take 100 mg by mouth 3 (three) times daily.)  . HYDROcodone-acetaminophen (NORCO/VICODIN) 5-325 MG tablet Take by mouth as needed.  Marland Kitchen losartan-hydrochlorothiazide (HYZAAR) 100-12.5 MG tablet TAKE 1 TABLET EVERY DAY  . metoprolol tartrate (LOPRESSOR) 25 MG tablet TAKE 1 TABLET TWICE DAILY  . pantoprazole (PROTONIX) 40 MG tablet Take 1 tablet (40 mg total) by mouth daily. 30 minutes before breakfast  . potassium chloride SA (KLOR-CON) 20 MEQ tablet TAKE 1 TABLET TWICE DAILY     Family History  Problem Relation Age of Onset  . Hypertension Mother   . Hypertension Father   . Colon cancer Father   . Colon polyps Neg Hx     Social History   Socioeconomic History  . Marital status: Married    Spouse name: Not on file  . Number of children: Not on file  . Years of education: Not on file  . Highest education level: Not on file  Occupational History  . Not on file  Tobacco Use  . Smoking status: Never Smoker  . Smokeless tobacco: Never Used  Vaping Use  . Vaping Use: Never used  Substance and Sexual Activity  . Alcohol use: Yes    Alcohol/week: 1.0 standard drink    Types: 1 Cans of beer per week    Comment: occasionally; once a week or once a month  . Drug use: No  . Sexual activity: Not on file  Other Topics Concern  . Not on file  Social History Narrative  . Not on file   Social Determinants of Health   Financial Resource Strain: Low Risk   . Difficulty of Paying Living Expenses: Not hard at all  Food Insecurity: No Food Insecurity   . Worried About Programme researcher, broadcasting/film/video in the Last Year: Never true  . Ran Out of Food in the Last Year: Never true  Transportation Needs: No Transportation Needs  . Lack of Transportation (Medical): No  . Lack of Transportation (Non-Medical): No  Physical Activity: Insufficiently Active  . Days of Exercise per Week: 3 days  . Minutes of Exercise per Session: 30 min  Stress: No Stress Concern Present  . Feeling of Stress : Not at all  Social Connections: Socially Integrated  . Frequency of Communication with Friends and Family: More than three times a week  . Frequency of Social Gatherings with Friends and Family: More than three times a week  . Attends Religious Services: More than 4 times per year  . Active Member of Clubs or Organizations: Yes  . Attends Banker Meetings: More than 4 times per year  . Marital Status: Married       Review of Systems: Gen: Denies fever, chills, anorexia. Denies fatigue, weakness, weight loss.  CV: Denies chest pain, palpitations, syncope, peripheral edema, and claudication. Resp: Denies dyspnea at rest, cough, wheezing, coughing up blood, and pleurisy. GI: see HPI Derm: Denies rash, itching, dry skin Psych: Denies depression, anxiety, memory loss, confusion. No homicidal or suicidal ideation.  Heme: Denies bruising, bleeding, and enlarged lymph nodes.  Observations/Objective: No distress. Unable to perform physical exam due to telephone encounter. No video available. Reported Height 5'7", reported weight 216 lbs. BMI 33.8.   Assessment and Plan: Scott Ritter is a 60 year old male presenting with a history of IDA in past, now resolved. 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 At that time, he had been taking NSAIDs routinely. He has now abstained from this long-term. Some question of Barrett's in remote past but biopsies not consistent with this. Currently not on a PPI but  denies any obvious GERD symptoms.   Father with history of colon cancer. As patient's last colonoscopy in 2017, will arrange high risk screening in near future. He is otherwise doing very well from a GI standpoint.   In light of his complicated GI history with GI bleeds, we will have him resume Protonix once daily. I have sent this to his pharmacy  Proceed with colonoscopy by Dr. Marletta Lor  in near future: the risks, benefits, and alternatives have been discussed with the patient in detail. The patient states understanding and desires to proceed. ASA 3.   Return in 1 year for routine follow-up.    Follow Up Instructions:    I discussed the assessment and treatment plan with the patient. The patient was provided an opportunity to ask questions and all were answered. The patient agreed with the plan and demonstrated an understanding of the instructions.   The patient was advised to call back or seek an in-person evaluation if the symptoms worsen or if the condition fails to improve as anticipated.  I provided 10 minutes of non face-to-face time during this telephone encounter.  Gelene Mink, PhD, ANP-BC [facility]

## 2021-02-19 ENCOUNTER — Telehealth: Payer: Self-pay | Admitting: Gastroenterology

## 2021-02-19 ENCOUNTER — Ambulatory Visit: Payer: Medicare HMO | Admitting: Podiatry

## 2021-02-19 ENCOUNTER — Telehealth (INDEPENDENT_AMBULATORY_CARE_PROVIDER_SITE_OTHER): Payer: Medicare HMO | Admitting: Gastroenterology

## 2021-02-19 ENCOUNTER — Encounter: Payer: Self-pay | Admitting: Gastroenterology

## 2021-02-19 ENCOUNTER — Telehealth: Payer: Self-pay | Admitting: *Deleted

## 2021-02-19 DIAGNOSIS — Z800 Family history of malignant neoplasm of digestive organs: Secondary | ICD-10-CM | POA: Diagnosis not present

## 2021-02-19 MED ORDER — PANTOPRAZOLE SODIUM 40 MG PO TBEC: 40.0000 mg | DELAYED_RELEASE_TABLET | Freq: Every day | ORAL | 3 refills | Status: DC

## 2021-02-19 MED ORDER — PEG 3350-KCL-NA BICARB-NACL 420 G PO SOLR: 4000.0000 mL | ORAL | 0 refills | Status: DC

## 2021-02-19 NOTE — Telephone Encounter (Signed)
[facility] Clinical pool:  Needs TCS with Dr. Marletta Lor, ASA 3, due to FH colon cancer.   Darl Pikes: needs 1 year follow-up.

## 2021-02-19 NOTE — Patient Instructions (Signed)
We are arranging a colonoscopy with Dr. Marletta Lor in the future due to your family history of colon cancer.  Please start taking Protonix once daily, 30 minutes before breakfast. I sent this to your pharmacy.  We will see you back in 1 year!  I enjoyed seeing you again today! As you know, I value our relationship and want to provide genuine, compassionate, and quality care. I welcome your feedback. If you receive a survey regarding your visit,  I greatly appreciate you taking time to fill this out. See you next time!  Gelene Mink, PhD, ANP-BC [facility]

## 2021-02-19 NOTE — Telephone Encounter (Signed)
Pre-op/covid test 04/05/21 at 8:45am. Appt letter mailed with procedure instructions.

## 2021-02-19 NOTE — Telephone Encounter (Signed)
[name], you are scheduled for a virtual visit with your provider today.  Just as we do with appointments in the office, we must obtain your consent to participate.  Your consent will be active for this visit and any virtual visit you may have with one of our providers in the next 365 days.  If you have a MyChart account, I can also send a copy of this consent to you electronically.  All virtual visits are billed to your insurance company just like a traditional visit in the office.  As this is a virtual visit, video technology does not allow for your provider to perform a traditional examination.  This may limit your provider's ability to fully assess your condition.  If your provider identifies any concerns that need to be evaluated in person or the need to arrange testing such as labs, EKG, etc, we will make arrangements to do so.  Although advances in technology are sophisticated, we cannot ensure that it will always work on either your end or our end.  If the connection with a video visit is poor, we may have to switch to a telephone visit.  With either a video or telephone visit, we are not always able to ensure that we have a secure connection.   I need to obtain your verbal consent now.   Are you willing to proceed with your visit today?

## 2021-02-19 NOTE — Telephone Encounter (Signed)
Called pt, TCS scheduled for 04/09/21 at 7:30am. Rx for prep sent to pharmacy. Orders entered.  PA for TCS submitted via [employer] website. [employer]# [id number], valid 04/09/21-05/09/21.

## 2021-02-19 NOTE — Telephone Encounter (Signed)
On recall  °

## 2021-02-19 NOTE — Addendum Note (Signed)
Addended by: Corrie Mckusick on: 02/19/2021 10:13 AM   Modules accepted: Orders

## 2021-02-19 NOTE — Telephone Encounter (Signed)
Pt consented to a virtual visit. 

## 2021-02-20 ENCOUNTER — Other Ambulatory Visit (INDEPENDENT_AMBULATORY_CARE_PROVIDER_SITE_OTHER): Payer: Self-pay | Admitting: Nurse Practitioner

## 2021-02-20 DIAGNOSIS — E782 Mixed hyperlipidemia: Secondary | ICD-10-CM

## 2021-02-20 NOTE — Progress Notes (Signed)
Cc'ed to pcp °

## 2021-02-26 ENCOUNTER — Other Ambulatory Visit (INDEPENDENT_AMBULATORY_CARE_PROVIDER_SITE_OTHER): Payer: Self-pay

## 2021-02-26 MED ORDER — PANTOPRAZOLE SODIUM 40 MG PO TBEC: 40.0000 mg | DELAYED_RELEASE_TABLET | Freq: Every day | ORAL | 1 refills | Status: DC

## 2021-04-02 NOTE — Patient Instructions (Signed)
Scott Ritter  04/02/2021     @PREFPERIOPPHARMACY @   Your procedure is scheduled on  04/09/2021.   Report to 04/11/2021 at  [phone]  A.M.  Call this number if you have problems the morning of surgery:  [phone]   Remember:  Follow the diet and prep instructions given to you by the office.    Take these medicines the morning of surgery with A SIP OF WATER    amlodipine, hydrocodone (if needed), metoprolol, protonix.    Please brush your teeth.  Do not wear jewelry, make-up or nail polish.  Do not wear lotions, powders, or perfumes, or deodorant.  Do not shave 48 hours prior to surgery.  Men may shave face and neck.  Do not bring valuables to the hospital.  [facility] is not responsible for any belongings or valuables.  Contacts, dentures or bridgework may not be worn into surgery.  Leave your suitcase in the car.  After surgery it may be brought to your room.  For patients admitted to the hospital, discharge time will be determined by your treatment team.  Patients discharged the day of surgery will not be allowed to drive home and must have someone with them for 24 hours.    Special instructions:   DO NOT smoke tobacco or vape for 24 hours before your procedure.  Please read over the following fact sheets that you were given. Anesthesia Post-op Instructions and Care and Recovery After Surgery      Colonoscopy, Adult, Care After This sheet gives you information about how to care for yourself after your procedure. Your health care provider may also give you more specific instructions. If you have problems or questions, contact your health careprovider. What can I expect after the procedure? After the procedure, it is common to have: A small amount of blood in your stool for 24 hours after the procedure. Some gas. Mild cramping or bloating of your abdomen. Follow these instructions at home: Eating and drinking  Drink enough fluid to keep your urine pale  yellow. Follow instructions from your health care provider about eating or drinking restrictions. Resume your normal diet as instructed by your health care provider. Avoid heavy or fried foods that are hard to digest.  Activity Rest as told by your health care provider. Avoid sitting for a long time without moving. Get up to take short walks every 1-2 hours. This is important to improve blood flow and breathing. Ask for help if you feel weak or unsteady. Return to your normal activities as told by your health care provider. Ask your health care provider what activities are safe for you. Managing cramping and bloating  Try walking around when you have cramps or feel bloated. Apply heat to your abdomen as told by your health care provider. Use the heat source that your health care provider recommends, such as a moist heat pack or a heating pad. Place a towel between your skin and the heat source. Leave the heat on for 20-30 minutes. Remove the heat if your skin turns bright red. This is especially important if you are unable to feel pain, heat, or cold. You may have a greater risk of getting burned.  General instructions If you were given a sedative during the procedure, it can affect you for several hours. Do not drive or operate machinery until your health care provider says that it is safe. For the first 24 hours after the procedure: Do  not sign important documents. Do not drink alcohol. Do your regular daily activities at a slower pace than normal. Eat soft foods that are easy to digest. Take over-the-counter and prescription medicines only as told by your health care provider. Keep all follow-up visits as told by your health care provider. This is important. Contact a health care provider if: You have blood in your stool 2-3 days after the procedure. Get help right away if you have: More than a small spotting of blood in your stool. Large blood clots in your stool. Swelling of your  abdomen. Nausea or vomiting. A fever. Increasing pain in your abdomen that is not relieved with medicine. Summary After the procedure, it is common to have a small amount of blood in your stool. You may also have mild cramping and bloating of your abdomen. If you were given a sedative during the procedure, it can affect you for several hours. Do not drive or operate machinery until your health care provider says that it is safe. Get help right away if you have a lot of blood in your stool, nausea or vomiting, a fever, or increased pain in your abdomen. This information is not intended to replace advice given to you by your health care provider. Make sure you discuss any questions you have with your healthcare provider. Document Revised: 09/23/2019 Document Reviewed: 04/25/2019 Elsevier Patient Education  2022 [employer] Monitored Anesthesia Care, Care After This sheet gives you information about how to care for yourself after your procedure. Your health care provider may also give you more specific instructions. If you have problems or questions, contact your health careprovider. What can I expect after the procedure? After the procedure, it is common to have: Tiredness. Forgetfulness about what happened after the procedure. Impaired judgment for important decisions. Nausea or vomiting. Some difficulty with balance. Follow these instructions at home: For the time period you were told by your health care provider:     Rest as needed. Do not participate in activities where you could fall or become injured. Do not drive or use machinery. Do not drink alcohol. Do not take sleeping pills or medicines that cause drowsiness. Do not make important decisions or sign legal documents. Do not take care of children on your own. Eating and drinking Follow the diet that is recommended by your health care provider. Drink enough fluid to keep your urine pale yellow. If you vomit: Drink water,  juice, or soup when you can drink without vomiting. Make sure you have little or no nausea before eating solid foods. General instructions Have a responsible adult stay with you for the time you are told. It is important to have someone help care for you until you are awake and alert. Take over-the-counter and prescription medicines only as told by your health care provider. If you have sleep apnea, surgery and certain medicines can increase your risk for breathing problems. Follow instructions from your health care provider about wearing your sleep device: Anytime you are sleeping, including during daytime naps. While taking prescription pain medicines, sleeping medicines, or medicines that make you drowsy. Avoid smoking. Keep all follow-up visits as told by your health care provider. This is important. Contact a health care provider if: You keep feeling nauseous or you keep vomiting. You feel light-headed. You are still sleepy or having trouble with balance after 24 hours. You develop a rash. You have a fever. You have redness or swelling around the IV site. Get help right away if:  You have trouble breathing. You have new-onset confusion at home. Summary For several hours after your procedure, you may feel tired. You may also be forgetful and have poor judgment. Have a responsible adult stay with you for the time you are told. It is important to have someone help care for you until you are awake and alert. Rest as told. Do not drive or operate machinery. Do not drink alcohol or take sleeping pills. Get help right away if you have trouble breathing, or if you suddenly become confused. This information is not intended to replace advice given to you by your health care provider. Make sure you discuss any questions you have with your healthcare provider. Document Revised: 06/14/2020 Document Reviewed: 09/01/2019 Elsevier Patient Education  2022 [employer].

## 2021-04-03 ENCOUNTER — Encounter (INDEPENDENT_AMBULATORY_CARE_PROVIDER_SITE_OTHER): Payer: Self-pay | Admitting: Nurse Practitioner

## 2021-04-03 ENCOUNTER — Other Ambulatory Visit: Payer: Self-pay

## 2021-04-03 ENCOUNTER — Ambulatory Visit (INDEPENDENT_AMBULATORY_CARE_PROVIDER_SITE_OTHER): Payer: Medicare HMO | Admitting: Nurse Practitioner

## 2021-04-03 ENCOUNTER — Telehealth (INDEPENDENT_AMBULATORY_CARE_PROVIDER_SITE_OTHER): Payer: Self-pay | Admitting: Nurse Practitioner

## 2021-04-03 VITALS — BP 130/80 | HR 53 | Temp 97.9°F | Ht 67.0 in | Wt 206.0 lb

## 2021-04-03 DIAGNOSIS — D509 Iron deficiency anemia, unspecified: Secondary | ICD-10-CM

## 2021-04-03 DIAGNOSIS — R200 Anesthesia of skin: Secondary | ICD-10-CM

## 2021-04-03 DIAGNOSIS — E876 Hypokalemia: Secondary | ICD-10-CM | POA: Diagnosis not present

## 2021-04-03 DIAGNOSIS — I10 Essential (primary) hypertension: Secondary | ICD-10-CM

## 2021-04-03 DIAGNOSIS — R208 Other disturbances of skin sensation: Secondary | ICD-10-CM

## 2021-04-03 DIAGNOSIS — M2142 Flat foot [pes planus] (acquired), left foot: Secondary | ICD-10-CM

## 2021-04-03 LAB — BASIC METABOLIC PANEL WITH GFR
BUN: 13 mg/dL (ref 7–25)
CO2: 25 mmol/L (ref 20–32)
Calcium: 9.5 mg/dL (ref 8.6–10.3)
Chloride: 103 mmol/L (ref 98–110)
Creat: 1.11 mg/dL (ref 0.70–1.33)
GFR, Est African American: 84 mL/min/{1.73_m2} (ref >=60)
GFR, Est Non African American: 73 mL/min/{1.73_m2} (ref >=60)
Glucose, Bld: 83 mg/dL (ref 65–99)
Potassium: 3.6 mmol/L (ref 3.5–5.3)
Sodium: 138 mmol/L (ref 135–146)

## 2021-04-03 MED ORDER — POTASSIUM CHLORIDE CRYS ER 20 MEQ PO TBCR
20.0000 meq | EXTENDED_RELEASE_TABLET | Freq: Three times a day (TID) | ORAL | 1 refills | Status: DC
Start: 2021-04-03 — End: 2021-05-16

## 2021-04-03 NOTE — Patient Instructions (Signed)
Please call this office if you do not hear from your neurologist's office by 04/17/21.

## 2021-04-03 NOTE — Progress Notes (Signed)
Subjective:  Patient ID: Scott Ritter, male    DOB: [birth year]  Age: 60 y.o. MRN: [id number]  CC:  Chief Complaint  Patient presents with   Follow-up    Wants to be referred to a different foot doctor and stated that he feels the nerves are getting worse and his pain is getting worse, pain is going from left foot up into left knee now   Hypertension   Anemia   Other    Hypokalemia, left foot pain      HPI  This patient arrives today for the above.  Hypertension: He continues on his antihypertensives and is tolerating his medications well.  Anemia: He is scheduled for colonoscopy for further evaluation of his anemia with gastroenterology next week.  He is not on iron pill any longer.  Last hemoglobin level was 12.6 this was collected about 3 months ago.  Hypokalemia: He has a history of hypokalemia and continues on potassium supplementation.  He was referred to nephrology for further evaluation of causes of his hypokalemia but this was never completed due to him having to reschedule the appointment.  He tells me he has been unable to get a hold of the office since having to cancel an appointment.  Left foot pain: He tells me he has been having left foot pain for the last 2 years.  Over the last 1/2 years seems that the pain has been worsening.  Tells me pain is constant is about 11/10 in intensity.  He describes it as sharp and throbbing.  He will take extra strength Tylenol as needed which will sometimes help the pain but does not seem to help much.  He tells me propping his foot up and soaking in Epsom salt seems to help the pain.  Standing for long period time seems to worsen the pain.  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  He tells me he also has a history of back surgery about 15 years ago.  He would like a second opinion regarding the foot  pain.  Past Medical History:  Diagnosis Date   Hypertension    Stroke [facility])    left side deficits      Family History  Problem Relation Age of Onset   Hypertension Mother    Hypertension Father    Colon cancer Father    Colon polyps Neg Hx     Social History   Social History Narrative   Not on file   Social History   Tobacco Use   Smoking status: Never   Smokeless tobacco: Never  Substance Use Topics   Alcohol use: Yes    Alcohol/week: 1.0 standard drink    Types: 1 Cans of beer per week    Comment: occasionally; once a week or once a month     Current Meds  Medication Sig   acetaminophen (TYLENOL) 500 MG tablet Take 500 mg by mouth as needed.   amLODipine (NORVASC) 10 MG tablet TAKE 1 TABLET EVERY DAY   atorvastatin (LIPITOR) 40 MG tablet TAKE 1 TABLET (40 MG TOTAL) BY MOUTH EVERY EVENING.   Cholecalciferol (VITAMIN D) 2000 units CAPS Take 8,000 Units by mouth daily.    hydrALAZINE (APRESOLINE) 100 MG tablet TAKE 1 TABLET THREE TIMES DAILY (Patient taking differently: Take 100 mg by mouth 3 (three) times daily.)   HYDROcodone-acetaminophen (NORCO/VICODIN)  5-325 MG tablet Take by mouth as needed.   losartan-hydrochlorothiazide (HYZAAR) 100-12.5 MG tablet TAKE 1 TABLET EVERY DAY   metoprolol tartrate (LOPRESSOR) 25 MG tablet TAKE 1 TABLET TWICE DAILY   pantoprazole (PROTONIX) 40 MG tablet Take 1 tablet (40 mg total) by mouth daily. 30 minutes before breakfast   [DISCONTINUED] potassium chloride SA (KLOR-CON) 20 MEQ tablet TAKE 1 TABLET TWICE DAILY    ROS:  Review of Systems  Eyes:  Negative for blurred vision.  Respiratory:  Negative for shortness of breath.   Cardiovascular:  Negative for chest pain.  Musculoskeletal:  Positive for back pain.       (+) left foot pain  Neurological:  Negative for dizziness.    Objective:   Today's Vitals: BP 130/80   Pulse (!) 53   Temp 97.9 F (36.6 C) (Temporal)   Ht $R'5\' 7"'hq$  (1.702 m)   Wt 206 lb (93.4 kg)   SpO2  98%   BMI 32.26 kg/m  Vitals with BMI 04/03/2021 02/19/2021 01/31/2021  Height $Remov'5\' 7"'ZTteSS$  - (No Data)  Weight 206 lbs - (No Data)  BMI 69.62 - -  Systolic 952 (No Data) (No Data)  Diastolic 80 (No Data) (No Data)  Pulse 53 - (No Data)     Physical Exam Vitals reviewed.  Constitutional:      Appearance: Normal appearance.  HENT:     Head: Normocephalic and atraumatic.  Cardiovascular:     Rate and Rhythm: Normal rate and regular rhythm.     Pulses:          Dorsalis pedis pulses are 2+ on the left side.  Pulmonary:     Effort: Pulmonary effort is normal.     Breath sounds: Normal breath sounds.  Musculoskeletal:     Cervical back: Neck supple.     Left foot: Normal range of motion. Deformity (flat foot) present.  Feet:     Left foot:     Protective Sensation: 10 sites tested.  4 sites sensed.     Skin integrity: Skin integrity normal.     Toenail Condition: Left toenails are normal.  Skin:    General: Skin is warm and dry.  Neurological:     Mental Status: He is alert and oriented to person, place, and time.  Psychiatric:        Mood and Affect: Mood normal.        Behavior: Behavior normal.        Thought Content: Thought content normal.        Judgment: Judgment normal.         Assessment and Plan   1. Numbness of left foot   2. Hypokalemia   3. Pes planus of left foot   4. Primary hypertension   5. Iron deficiency anemia, unspecified iron deficiency anemia type      Plan: 1., 3.  We will refer him to neurology for further evaluation of the foot pain to see if this could have any nerve involvement.  He will also follow-up with podiatrist as scheduled. 2.  We will reach out to referral specialist to determine if we can refer him to nephrology.  For now he will continue taking his potassium supplement we will check metabolic panel to monitor level today. 4.  Blood pressure reasonable on current medications to continue taking his medication as prescribed. 5.   Follow-up with gastroenterology to undergo colonoscopy for further evaluation of his iron deficiency anemia.    Tests ordered Orders  Placed This Encounter  Procedures   BMP with eGFR([facility])   Ambulatory referral to Neurology      Meds ordered this encounter  Medications   potassium chloride SA (KLOR-CON) 20 MEQ tablet    Sig: Take 1 tablet (20 mEq total) by mouth 3 (three) times daily.    Dispense:  270 tablet    Refill:  1    Order Specific Question:   Supervising Provider    Answer:   Doree Albee [[id number]]    Patient to follow-up in 3 months or sooner as needed.  Ailene Ards, NP

## 2021-04-03 NOTE — Telephone Encounter (Addendum)
Ordering referral to [facility] for evaluation of left foot pain.  We will also look into referral to nephrology that was made a few months ago.  If this is still active can we get him rescheduled with them?  If not I can reorder the referral.  Thank you.

## 2021-04-03 NOTE — Telephone Encounter (Signed)
Yes, will send it over.

## 2021-04-05 ENCOUNTER — Encounter (HOSPITAL_COMMUNITY): Payer: Self-pay

## 2021-04-05 ENCOUNTER — Telehealth: Payer: Self-pay

## 2021-04-05 ENCOUNTER — Encounter (HOSPITAL_COMMUNITY)
Admission: RE | Admit: 2021-04-05 | Discharge: 2021-04-05 | Disposition: A | Discharge status: Home / Self Care | Payer: Medicare HMO | Source: Ambulatory Visit | Attending: Internal Medicine | Admitting: Internal Medicine

## 2021-04-05 ENCOUNTER — Other Ambulatory Visit: Payer: Self-pay

## 2021-04-05 ENCOUNTER — Other Ambulatory Visit (HOSPITAL_COMMUNITY): Payer: Medicare HMO | Attending: Internal Medicine

## 2021-04-05 DIAGNOSIS — Z01818 Encounter for other preprocedural examination: Secondary | ICD-10-CM | POA: Insufficient documentation

## 2021-04-05 DIAGNOSIS — Z01810 Encounter for preprocedural cardiovascular examination: Secondary | ICD-10-CM | POA: Diagnosis present

## 2021-04-05 HISTORY — DX: Gastro-esophageal reflux disease without esophagitis: K21.9

## 2021-04-05 MED ORDER — PEG 3350-KCL-NA BICARB-NACL 420 G PO SOLR: 4000.0000 mL | ORAL | 0 refills | Status: DC

## 2021-04-05 NOTE — Telephone Encounter (Signed)
Pt came by office asking about pre-op/covid test appt. He had appt letter and procedure instructions with him. Advised him to go to short stay for pre-op appt, covid test no longer required. He requested rx for TCS prep to be resent to [facility].  Rx for prep resent.

## 2021-04-09 ENCOUNTER — Ambulatory Visit (HOSPITAL_COMMUNITY)
Admission: RE | Admit: 2021-04-09 | Discharge: 2021-04-09 | Disposition: A | Discharge status: Home / Self Care | Payer: Medicare HMO | Attending: Internal Medicine | Admitting: Internal Medicine

## 2021-04-09 ENCOUNTER — Ambulatory Visit (HOSPITAL_COMMUNITY): Payer: Medicare HMO | Admitting: Anesthesiology

## 2021-04-09 ENCOUNTER — Other Ambulatory Visit: Payer: Self-pay

## 2021-04-09 ENCOUNTER — Encounter (HOSPITAL_COMMUNITY): Payer: Self-pay

## 2021-04-09 ENCOUNTER — Encounter (HOSPITAL_COMMUNITY)
Admission: RE | Disposition: A | Discharge status: Home / Self Care | Payer: Self-pay | Source: Home / Self Care | Attending: Internal Medicine

## 2021-04-09 DIAGNOSIS — Z79899 Other long term (current) drug therapy: Secondary | ICD-10-CM | POA: Diagnosis not present

## 2021-04-09 DIAGNOSIS — Z1211 Encounter for screening for malignant neoplasm of colon: Secondary | ICD-10-CM | POA: Diagnosis not present

## 2021-04-09 DIAGNOSIS — I471 Supraventricular tachycardia: Secondary | ICD-10-CM | POA: Diagnosis not present

## 2021-04-09 DIAGNOSIS — Z8719 Personal history of other diseases of the digestive system: Secondary | ICD-10-CM | POA: Insufficient documentation

## 2021-04-09 DIAGNOSIS — K648 Other hemorrhoids: Secondary | ICD-10-CM | POA: Insufficient documentation

## 2021-04-09 DIAGNOSIS — Z800 Family history of malignant neoplasm of digestive organs: Secondary | ICD-10-CM | POA: Diagnosis not present

## 2021-04-09 DIAGNOSIS — K5150 Left sided colitis without complications: Secondary | ICD-10-CM | POA: Diagnosis not present

## 2021-04-09 DIAGNOSIS — K635 Polyp of colon: Secondary | ICD-10-CM

## 2021-04-09 DIAGNOSIS — K5140 Inflammatory polyps of colon without complications: Secondary | ICD-10-CM | POA: Insufficient documentation

## 2021-04-09 HISTORY — PX: COLONOSCOPY WITH PROPOFOL: SHX5780

## 2021-04-09 HISTORY — PX: POLYPECTOMY: SHX5525

## 2021-04-09 SURGERY — COLONOSCOPY WITH PROPOFOL
Anesthesia: General

## 2021-04-09 MED ORDER — LIDOCAINE HCL (CARDIAC) PF 100 MG/5ML IV SOSY
PREFILLED_SYRINGE | INTRAVENOUS | Status: DC | PRN
  Administered 2021-04-09: 50 mg via INTRAVENOUS

## 2021-04-09 MED ORDER — PROPOFOL 10 MG/ML IV BOLUS
INTRAVENOUS | Status: DC | PRN
  Administered 2021-04-09: 40 mg via INTRAVENOUS
  Administered 2021-04-09: 100 mg via INTRAVENOUS
  Administered 2021-04-09: 40 mg via INTRAVENOUS
  Administered 2021-04-09: 20 mg via INTRAVENOUS

## 2021-04-09 MED ORDER — STERILE WATER FOR IRRIGATION IR SOLN
Status: DC | PRN
  Administered 2021-04-09: 5 mL

## 2021-04-09 MED ORDER — LACTATED RINGERS IV SOLN: INTRAVENOUS | Status: DC

## 2021-04-09 NOTE — Anesthesia Procedure Notes (Signed)
Date/Time: 04/09/2021 7:50 AM Performed by: Julian Reil, CRNA Pre-anesthesia Checklist: Patient identified, Emergency Drugs available, Suction available and Patient being monitored Patient Re-evaluated:Patient Re-evaluated prior to induction Oxygen Delivery Method: Nasal cannula Induction Type: IV induction Placement Confirmation: positive ETCO2

## 2021-04-09 NOTE — Discharge Instructions (Addendum)
  Colonoscopy Discharge Instructions  Read the instructions outlined below and refer to this sheet in the next few weeks. These discharge instructions provide you with general information on caring for yourself after you leave the hospital. Your doctor may also give you specific instructions. While your treatment has been planned according to the most current medical practices available, unavoidable complications occasionally occur.   ACTIVITY You may resume your regular activity, but move at a slower pace for the next 24 hours.  Take frequent rest periods for the next 24 hours.  Walking will help get rid of the air and reduce the bloated feeling in your belly (abdomen).  No driving for 24 hours (because of the medicine (anesthesia) used during the test).   Do not sign any important legal documents or operate any machinery for 24 hours (because of the anesthesia used during the test).  NUTRITION Drink plenty of fluids.  You may resume your normal diet as instructed by your doctor.  Begin with a light meal and progress to your normal diet. Heavy or fried foods are harder to digest and may make you feel sick to your stomach (nauseated).  Avoid alcoholic beverages for 24 hours or as instructed.  MEDICATIONS You may resume your normal medications unless your doctor tells you otherwise.  WHAT YOU CAN EXPECT TODAY Some feelings of bloating in the abdomen.  Passage of more gas than usual.  Spotting of blood in your stool or on the toilet paper.  IF YOU HAD POLYPS REMOVED DURING THE COLONOSCOPY: No aspirin products for 7 days or as instructed.  No alcohol for 7 days or as instructed.  Eat a soft diet for the next 24 hours.  FINDING OUT THE RESULTS OF YOUR TEST Not all test results are available during your visit. If your test results are not back during the visit, make an appointment with your caregiver to find out the results. Do not assume everything is normal if you have not heard from your  caregiver or the medical facility. It is important for you to follow up on all of your test results.  SEEK IMMEDIATE MEDICAL ATTENTION IF: You have more than a spotting of blood in your stool.  Your belly is swollen (abdominal distention).  You are nauseated or vomiting.  You have a temperature over 101.  You have abdominal pain or discomfort that is severe or gets worse throughout the day.   Your colonoscopy revealed 2 polyp(s) which I removed successfully. Await pathology results, my office will contact you. I recommend repeating colonoscopy in 5 years for surveillance purposes. Otherwise follow up with GI in 1 year   I hope you have a great rest of your week!  Hennie Duos. Marletta Lor, D.O. Gastroenterology and Hepatology [facility]

## 2021-04-09 NOTE — H&P (Signed)
Primary Care Physician:  Elenore Paddy, NP Primary Gastroenterologist:  Dr. Marletta Lor  Pre-Procedure History & Physical: HPI:  Scott Ritter is a 60 y.o. male is here for a colonoscopy to be performed for family history of colon cancer in father.   Past Medical History:  Diagnosis Date   GERD (gastroesophageal reflux disease)    Hypertension    Stroke [facility])    left side deficits    Past Surgical History:  Procedure Laterality Date   BACK SURGERY     BIOPSY  09/17/2018   Procedure: BIOPSY;  Surgeon: West Bali, MD;  Location: [facility];  Service: Endoscopy;;  duodenal biopsy    COLONOSCOPY N/A 02/29/2016   Dr. Darrick Penna: redundant colon, non-bleeding internal hemorrhoids    ESOPHAGOGASTRODUODENOSCOPY N/A 12/17/2016   Dr. Darrick Penna: 3 mm nonbleeding Mallory-Weiss tear.  EGD performed for hematemesis.  Hemoglobin normal.   ESOPHAGOGASTRODUODENOSCOPY N/A 02/19/2018   Dr. Jena Gauss: Performed for melena, hemoglobin of 6.  Mucosal changes in the esophagus query short segment Barrett's, biopsy more consistent with reflux changes.  Erosive gastropathy but no H. pylori.  Duodenal erosions.  Suspected NSAID induced injury.   ESOPHAGOGASTRODUODENOSCOPY  08/18/2018   Dr. Karilyn Cota: IDA/heme positive stool.  Esophageal mucosal changes consistent with short segment Barrett's esophagus, not biopsied.  2 cm hiatal hernia.  Duodenal bulb, second portion of duodenum, third portion of the duodenum.  Video capsule somewhat difficult to pass through the oropharynx but was eventually advanced into the second portion of the duodenum and released.   ESOPHAGOGASTRODUODENOSCOPY N/A 08/18/2018   Procedure: ESOPHAGOGASTRODUODENOSCOPY (EGD);  Surgeon: Malissa Hippo, MD;  [facility];  Service: Endoscopy;  Laterality: N/A;   ESOPHAGOGASTRODUODENOSCOPY N/A 09/17/2018   gastritis, 2 nonbleeding angiectasia's in the duodenum were treated with [facility] coagulation status post biopsy.   GIVENS CAPSULE STUDY N/A  08/18/2018   Procedure: GIVENS CAPSULE STUDY;  Surgeon: Malissa Hippo, MD;  Location: [facility];  Service: Endoscopy;  Laterality: N/A;   HERNIA REPAIR      Prior to Admission medications   Medication Sig Start Date End Date Taking? Authorizing Provider  acetaminophen (TYLENOL) 500 MG tablet Take 500 mg by mouth every 6 (six) hours as needed for moderate pain.   Yes [provider]  amLODipine (NORVASC) 10 MG tablet TAKE 1 TABLET EVERY DAY Patient taking differently: Take 10 mg by mouth daily. 02/15/21  Yes Gosrani, Nimish C, MD  atorvastatin (LIPITOR) 40 MG tablet TAKE 1 TABLET (40 MG TOTAL) BY MOUTH EVERY EVENING. 02/21/21  Yes Gosrani, Nimish C, MD  Cholecalciferol (VITAMIN D) 2000 units CAPS Take 2,000 Units by mouth daily.   Yes [provider]  hydrALAZINE (APRESOLINE) 100 MG tablet TAKE 1 TABLET THREE TIMES DAILY Patient taking differently: Take 100 mg by mouth 3 (three) times daily. 01/14/20 04/03/21 Yes Gosrani, Nimish C, MD  HYDROcodone-acetaminophen (NORCO/VICODIN) 5-325 MG tablet Take 1 tablet by mouth every 6 (six) hours as needed for moderate pain. 09/02/19  Yes [provider]  losartan-hydrochlorothiazide (HYZAAR) 100-12.5 MG tablet TAKE 1 TABLET EVERY DAY Patient taking differently: Take 1 tablet by mouth daily. 02/15/21  Yes Gosrani, Nimish C, MD  metoprolol tartrate (LOPRESSOR) 25 MG tablet TAKE 1 TABLET TWICE DAILY Patient taking differently: Take 25 mg by mouth 2 (two) times daily. 02/15/21 05/16/21 Yes Gosrani, Nimish C, MD  pantoprazole (PROTONIX) 40 MG tablet Take 1 tablet (40 mg total) by mouth daily. 30 minutes before breakfast 02/26/21  Yes Gosrani,  Nimish C, MD  polyethylene glycol-electrolytes (TRILYTE) 420 g solution Take 4,000 mLs by mouth as directed. 04/05/21  Yes Locke Barrell, Hennie Duos, DO  potassium chloride SA (KLOR-CON) 20 MEQ tablet Take 1 tablet (20 mEq total) by mouth 3 (three) times daily. 04/03/21  Yes Elenore Paddy, NP    Allergies as of  02/19/2021   (No Known Allergies)    Family History  Problem Relation Age of Onset   Hypertension Mother    Hypertension Father    Colon cancer Father    Colon polyps Neg Hx     Social History   Socioeconomic History   Marital status: Married    Spouse name: Not on file   Number of children: Not on file   Years of education: Not on file   Highest education level: Not on file  Occupational History   Not on file  Tobacco Use   Smoking status: Never   Smokeless tobacco: Never  Vaping Use   Vaping Use: Never used  Substance and Sexual Activity   Alcohol use: Yes    Alcohol/week: 1.0 standard drink    Types: 1 Cans of beer per week    Comment: occasionally; once a week or once a month   Drug use: No   Sexual activity: Not on file  Other Topics Concern   Not on file  Social History Narrative   Not on file   Social Determinants of Health   Financial Resource Strain: Low Risk    Difficulty of Paying Living Expenses: Not hard at all  Food Insecurity: No Food Insecurity   Worried About Programme researcher, broadcasting/film/video in the Last Year: Never true   Ran Out of Food in the Last Year: Never true  Transportation Needs: No Transportation Needs   Lack of Transportation (Medical): No   Lack of Transportation (Non-Medical): No  Physical Activity: Insufficiently Active   Days of Exercise per Week: 3 days   Minutes of Exercise per Session: 30 min  Stress: No Stress Concern Present   Feeling of Stress : Not at all  Social Connections: Socially Integrated   Frequency of Communication with Friends and Family: More than three times a week   Frequency of Social Gatherings with Friends and Family: More than three times a week   Attends Religious Services: More than 4 times per year   Active Member of Clubs or Organizations: Yes   Attends Engineer, structural: More than 4 times per year   Marital Status: Married  Catering manager Violence: Not At Risk   Fear of Current or Ex-Partner:  No   Emotionally Abused: No   Physically Abused: No   Sexually Abused: No    Review of Systems: See HPI, otherwise negative ROS  Physical Exam: Vital signs in last 24 hours: Temp:  [97.7 F (36.5 C)] 97.7 F (36.5 C) (06/28 0651) Pulse Rate:  [50] 50 (06/28 0651) Resp:  [14] 14 (06/28 0651) BP: (134)/(73) 134/73 (06/28 0651) SpO2:  [97 %] 97 % (06/28 0651) Weight:  [93.4 kg] 93.4 kg (06/28 0651)   General:   Alert,  Well-developed, well-nourished, pleasant and cooperative in NAD Head:  Normocephalic and atraumatic. Eyes:  Sclera clear, no icterus.   Conjunctiva pink. Ears:  Normal auditory acuity. Nose:  No deformity, discharge,  or lesions. Mouth:  No deformity or lesions, dentition normal. Neck:  Supple; no masses or thyromegaly. Lungs:  Clear throughout to auscultation.   No wheezes, crackles, or rhonchi. No  acute distress. Heart:  Regular rate and rhythm; no murmurs, clicks, rubs,  or gallops. Abdomen:  Soft, nontender and nondistended. No masses, hepatosplenomegaly or hernias noted. Normal bowel sounds, without guarding, and without rebound.   Msk:  Symmetrical without gross deformities. Normal posture. Extremities:  Without clubbing or edema. Neurologic:  Alert and  oriented x4;  grossly normal neurologically. Skin:  Intact without significant lesions or rashes. Cervical Nodes:  No significant cervical adenopathy. Psych:  Alert and cooperative. Normal mood and affect.  Impression/Plan: WILLET SCHLEIFER is here for a colonoscopy to be performed for family history of colon cancer in father.   The risks of the procedure including infection, bleed, or perforation as well as benefits, limitations, alternatives and imponderables have been reviewed with the patient. Questions have been answered. All parties agreeable.

## 2021-04-09 NOTE — Transfer of Care (Signed)
Immediate Anesthesia Transfer of Care Note  Patient: Scott Ritter  Procedure(s) Performed: COLONOSCOPY WITH PROPOFOL  Patient Location: Short Stay  Anesthesia Type:General  Level of Consciousness: awake, alert  and oriented  Airway & Oxygen Therapy: Patient Spontanous Breathing  Post-op Assessment: Report given to RN and Post -op Vital signs reviewed and stable  Post vital signs: Reviewed and stable  Last Vitals:  Vitals Value Taken Time  BP    Temp    Pulse    Resp    SpO2      Last Pain:  Vitals:   04/09/21 0651  TempSrc: Oral  PainSc: 0-No pain         Complications: No notable events documented.

## 2021-04-09 NOTE — Anesthesia Postprocedure Evaluation (Signed)
Anesthesia Post Note  Patient: Scott Ritter  Procedure(s) Performed: COLONOSCOPY WITH PROPOFOL  Patient location during evaluation: Phase II Anesthesia Type: General Level of consciousness: awake and alert and oriented Pain management: pain level controlled Vital Signs Assessment: post-procedure vital signs reviewed and stable Respiratory status: spontaneous breathing and respiratory function stable Cardiovascular status: blood pressure returned to baseline and stable Postop Assessment: no apparent nausea or vomiting Anesthetic complications: no   No notable events documented.   Last Vitals:  Vitals:   04/09/21 0651 04/09/21 0810  BP: 134/73 131/78  Pulse: (!) 50 (!) 54  Resp: 14 14  Temp: 36.5 C 36.6 C  SpO2: 97% 98%    Last Pain:  Vitals:   04/09/21 0810  TempSrc: Oral  PainSc: 0-No pain                 Kevyn Wengert C Chakira Jachim

## 2021-04-09 NOTE — Op Note (Signed)
[facility] Patient Name: Scott Ritter Procedure Date: 04/09/2021 7:12 AM MRN: [id number] Date of Birth: 1961-08-13 Attending MD: Elon Alas. Abbey Chatters DO CSN: [id number] Age: 60 Admit Type: Outpatient Procedure:                Colonoscopy Indications:              Screening in patient at increased risk: Family                            history of 1st-degree relative with colorectal                            cancer Providers:                Elon Alas. Abbey Chatters, DO, Lurline Del, RN, Randa Spike, Technician Referring MD:              Medicines:                See the Anesthesia note for documentation of the                            administered medications Complications:            No immediate complications. Estimated Blood Loss:     Estimated blood loss was minimal. Procedure:                Pre-Anesthesia Assessment:                           - The anesthesia plan was to use monitored                            anesthesia care (MAC).                           After obtaining informed consent, the colonoscope                            was passed under direct vision. Throughout the                            procedure, the patient's blood pressure, pulse, and                            oxygen saturations were monitored continuously. The                            [id number]-HQ190L ([id number]) scope was introduced through                            the anus and advanced to the the cecum, identified                            by appendiceal orifice and ileocecal valve. The  colonoscopy was performed without difficulty. The                            patient tolerated the procedure well. The quality                            of the bowel preparation was evaluated using the                            BBPS [facility] Bowel Preparation Scale) with scores                            of: Right Colon = 2 (minor amount of residual                             staining, small fragments of stool and/or opaque                            liquid, but mucosa seen well), Transverse Colon = 2                            (minor amount of residual staining, small fragments                            of stool and/or opaque liquid, but mucosa seen                            well) and Left Colon = 2 (minor amount of residual                            staining, small fragments of stool and/or opaque                            liquid, but mucosa seen well). The total BBPS score                            equals 6. The quality of the bowel preparation was                            fair. Scope In: 7:50:14 AM Scope Out: 8:05:17 AM Scope Withdrawal Time: 0 hours 11 minutes 51 seconds  Total Procedure Duration: 0 hours 15 minutes 3 seconds  Findings:      The perianal and digital rectal examinations were normal.      Non-bleeding internal hemorrhoids were found during endoscopy.      Two sessile polyps were found in the descending colon and cecum. The       polyps were 2 mm in size. These polyps were removed with a cold biopsy       forceps. Resection and retrieval were complete.      The exam was otherwise without abnormality. Impression:               - Preparation of the colon was fair.                           -  Non-bleeding internal hemorrhoids.                           - Two 2 mm polyps in the descending colon and in                            the cecum, removed with a cold biopsy forceps.                            Resected and retrieved.                           - The examination was otherwise normal. Moderate Sedation:      Per Anesthesia Care Recommendation:           - Patient has a contact number available for                            emergencies. The signs and symptoms of potential                            delayed complications were discussed with the                            patient. Return to normal activities tomorrow.                             Written discharge instructions were provided to the                            patient.                           - Resume previous diet.                           - Continue present medications.                           - Await pathology results.                           - Repeat colonoscopy in 5 years for surveillance.                           - Return to GI clinic PRN. Procedure Code(s):        --- Professional ---                           [phone], Colonoscopy, flexible; with biopsy, single                            or multiple Diagnosis Code(s):        --- Professional ---                           Z80.0, Family history of  malignant neoplasm of                            digestive organs                           K64.8, Other hemorrhoids                           K63.5, Polyp of colon CPT copyright 2019 American Medical Association. All rights reserved. The codes documented in this report are preliminary and upon coder review may  be revised to meet current compliance requirements. Elon Alas. Abbey Chatters, DO [place] Abbey Chatters, DO 04/09/2021 8:11:34 AM This report has been signed electronically. Number of Addenda: 0

## 2021-04-09 NOTE — Anesthesia Preprocedure Evaluation (Signed)
Anesthesia Evaluation  Patient identified by MRN, date of birth, ID band Patient awake    Reviewed: Allergy & Precautions, NPO status , Patient's Chart, lab work & pertinent test results  History of Anesthesia Complications Negative for: history of anesthetic complications  Airway Mallampati: III  TM Distance: >3 FB Neck ROM: Full    Dental  (+) Dental Advisory Given, Missing, Poor Dentition   Pulmonary neg pulmonary ROS,    Pulmonary exam normal breath sounds clear to auscultation       Cardiovascular Exercise Tolerance: Good hypertension, Pt. on medications (-) CHF + dysrhythmias Supra Ventricular Tachycardia  Rhythm:Regular Rate:Normal  05-Apr-2021 09:13:10 [facility] ROUTINE RECORD 1960-12-28 (47 yr) Male Black Room: Loc:[id number] Technician: N.Dove Test ind: Vent. rate 53 BPM PR interval 184 ms QRS duration 94 ms QT/QTcB 450/422 ms P-R-T axes 63 78 -4 Sinus bradycardia Septal infarct , age undetermined Abnormal ECG Confirmed by Carylon Perches [phone]) on 04/06/2021 8:03:28 AM   Neuro/Psych CVA, Residual Symptoms negative psych ROS   GI/Hepatic Neg liver ROS, GERD  Medicated,  Endo/Other  negative endocrine ROS  Renal/GU Renal InsufficiencyRenal disease     Musculoskeletal  (+) Arthritis  (back sx),   Abdominal   Peds  Hematology  (+) Blood dyscrasia, anemia ,   Anesthesia Other Findings   Reproductive/Obstetrics negative OB ROS                            Anesthesia Physical Anesthesia Plan  ASA: 3  Anesthesia Plan: General   Post-op Pain Management:    Induction: Intravenous  PONV Risk Score and Plan: Propofol infusion  Airway Management Planned: Nasal Cannula and Natural Airway  Additional Equipment:   Intra-op Plan:   Post-operative Plan:   Informed Consent: I have reviewed the patients History and Physical, chart, labs and discussed the  procedure including the risks, benefits and alternatives for the proposed anesthesia with the patient or authorized representative who has indicated his/her understanding and acceptance.     Dental advisory given  Plan Discussed with: CRNA and Surgeon  Anesthesia Plan Comments:         Anesthesia Quick Evaluation

## 2021-04-11 ENCOUNTER — Other Ambulatory Visit (INDEPENDENT_AMBULATORY_CARE_PROVIDER_SITE_OTHER): Payer: Self-pay | Admitting: Internal Medicine

## 2021-04-11 DIAGNOSIS — E876 Hypokalemia: Secondary | ICD-10-CM

## 2021-04-11 LAB — SURGICAL PATHOLOGY

## 2021-04-16 ENCOUNTER — Encounter (HOSPITAL_COMMUNITY): Payer: Self-pay | Admitting: Internal Medicine

## 2021-05-16 ENCOUNTER — Other Ambulatory Visit (INDEPENDENT_AMBULATORY_CARE_PROVIDER_SITE_OTHER): Payer: Self-pay | Admitting: Internal Medicine

## 2021-05-16 DIAGNOSIS — E876 Hypokalemia: Secondary | ICD-10-CM

## 2021-07-04 ENCOUNTER — Ambulatory Visit (INDEPENDENT_AMBULATORY_CARE_PROVIDER_SITE_OTHER): Payer: Medicare HMO | Admitting: Internal Medicine

## 2021-07-08 ENCOUNTER — Ambulatory Visit: Payer: Medicare HMO | Admitting: Neurology

## 2021-08-09 DIAGNOSIS — I639 Cerebral infarction, unspecified: Secondary | ICD-10-CM | POA: Diagnosis not present

## 2021-09-11 ENCOUNTER — Ambulatory Visit: Payer: Medicare HMO | Admitting: Nurse Practitioner

## 2021-09-12 ENCOUNTER — Ambulatory Visit: Payer: Medicare HMO | Admitting: Neurology

## 2021-09-16 ENCOUNTER — Ambulatory Visit (INDEPENDENT_AMBULATORY_CARE_PROVIDER_SITE_OTHER): Payer: Medicare HMO | Admitting: Nurse Practitioner

## 2021-09-16 ENCOUNTER — Encounter: Payer: Self-pay | Admitting: Nurse Practitioner

## 2021-09-16 ENCOUNTER — Other Ambulatory Visit: Payer: Self-pay

## 2021-09-16 VITALS — BP 155/77 | HR 61 | Ht 67.0 in | Wt 201.0 lb

## 2021-09-16 DIAGNOSIS — Z23 Encounter for immunization: Secondary | ICD-10-CM | POA: Insufficient documentation

## 2021-09-16 DIAGNOSIS — M79672 Pain in left foot: Secondary | ICD-10-CM

## 2021-09-16 DIAGNOSIS — I639 Cerebral infarction, unspecified: Secondary | ICD-10-CM | POA: Diagnosis not present

## 2021-09-16 DIAGNOSIS — I10 Essential (primary) hypertension: Secondary | ICD-10-CM

## 2021-09-16 DIAGNOSIS — Z139 Encounter for screening, unspecified: Secondary | ICD-10-CM | POA: Diagnosis not present

## 2021-09-16 DIAGNOSIS — D509 Iron deficiency anemia, unspecified: Secondary | ICD-10-CM | POA: Diagnosis not present

## 2021-09-16 DIAGNOSIS — E669 Obesity, unspecified: Secondary | ICD-10-CM | POA: Insufficient documentation

## 2021-09-16 DIAGNOSIS — G8929 Other chronic pain: Secondary | ICD-10-CM | POA: Insufficient documentation

## 2021-09-16 DIAGNOSIS — E782 Mixed hyperlipidemia: Secondary | ICD-10-CM | POA: Diagnosis not present

## 2021-09-16 MED ORDER — LOSARTAN POTASSIUM-HCTZ 100-25 MG PO TABS: 1.0000 | ORAL_TABLET | Freq: Every day | ORAL | 3 refills | Status: DC

## 2021-09-16 NOTE — Assessment & Plan Note (Signed)
Check lipid panel. Continue atorvastatin 40mg  daily. Eat a healthy diet, including lots of fruits and vegetables. Avoid foods with a lot of saturated and trans fats, such as red meat, butter, fried foods and cheese . Maintain a healthy weight.

## 2021-09-16 NOTE — Assessment & Plan Note (Signed)
Had stroke over 15 years ago. He is not on blood thinner, Pt has history of GI bleed, he is Lipitor 40mg  daily. Check lipid level

## 2021-09-16 NOTE — Assessment & Plan Note (Signed)
Was treated was neuroma pain by podiatry, had sclerosing injection at the podiatrist back in April. Pt stated that he has an appointment with neurology in 11/12/21  because the injections were not taking care of his pain. Continue tylenol as needed.

## 2021-09-16 NOTE — Patient Instructions (Signed)
Please get your labs done 3-5 days before your next visit.  Get your shingles and TDAP vaccine at your pharmacy.   Start taking losartan - hydrochlorothiazide 100 mg-25 mg once daily.   It is important that you exercise regularly at least 30 minutes 5 times a week.  Think about what you will eat, plan ahead. Choose " clean, green, fresh or frozen" over canned, processed or packaged foods which are more sugary, salty and fatty. 70 to 75% of food eaten should be vegetables and fruit. Three meals at set times with snacks allowed between meals, but they must be fruit or vegetables. Aim to eat over a 12 hour period , example 7 am to 7 pm, and STOP after  your last meal of the day. Drink water,generally about 64 ounces per day, no other drink is as healthy. Fruit juice is best enjoyed in a healthy way, by EATING the fruit.  Thanks for choosing [facility], we consider it a privelige to serve you.

## 2021-09-16 NOTE — Progress Notes (Signed)
   Scott Ritter     MRN: [id number]      DOB: [birth year]   HPI Scott Ritter is here to establish care. Previous pt of Dr.Gorzani. Has sharp pain 8/10 on the left foot was seeing [facility]. Had  foot surgery last year and has pain injections which did not help his pain, he  has an appointment coming up with neurology in Jan.2023. wears a brace on his foot when walking and exercising at the [facility].Use a cane when walking long distances  [facility], does water aerobics as well. Has left arm and left foot weakness from stroke 15 years ago.   Patient had colonoscopy done this year , had 2 polyps taken out, next colon copy in 5 years.   Flu shot will be taken today, get shingles and TDAP vaccine at the pharmacy     ROS Denies recent fever or chills. Denies sinus pressure, nasal congestion, ear pain or sore throat. Denies chest congestion, productive cough or wheezing. Denies chest pains, palpitations and leg swelling Denies abdominal pain, nausea, vomiting,diarrhea or constipation.   Denies dysuria, frequency, hesitancy or incontinence. has left foot pain, has left arm and left weakness from stroke 15 years ago.  Denies headaches, seizures, numbness, or tingling. Denies depression, anxiety or insomnia. Denies skin break down or rash.   PE  BP (!) 155/77 (BP Location: Right Arm, Patient Position: Sitting, Cuff Size: Normal)   Pulse 61   Ht 5\' 7"  (1.702 m)   Wt 201 lb (91.2 kg)   SpO2 97%   BMI 31.48 kg/m   Patient alert and oriented and in no cardiopulmonary distress.  HEENT: No facial asymmetry, EOMI,     Neck supple .  Chest: Clear to auscultation bilaterally.  CVS: S1, S2 no murmurs, no S3.Regular rate.  ABD: Soft non tender.   Ext: No edema  MS: Adequate ROM spine, shoulders, hips and knees.  Skin: Intact, no ulcerations or rash noted.  Psych: Good eye contact, normal affect. Memory intact not anxious or depressed appearing.  CNS:  sensation normal throughout.no focal deficit. Mildly diminished strength in left foot and left arm due to past stroke, tenderness on palpation of left foot in the middle cuneiform area.   Assessment & Plan

## 2021-09-16 NOTE — Assessment & Plan Note (Addendum)
Do Regular vigorous exercise 30 minutes 5 times weekly.  Portion control and healthy food choices discussed with pt. Wt Readings from Last 3 Encounters:  09/16/21 201 lb (91.2 kg)  04/09/21 205 lb 14.6 oz (93.4 kg)  04/05/21 205 lb 14.6 oz (93.4 kg)

## 2021-09-16 NOTE — Assessment & Plan Note (Signed)
BP Readings from Last 3 Encounters:  09/16/21 (!) 155/77  04/09/21 131/78  04/05/21 (!) 149/77   Recheck BP was 157/80. Avoid salty food. Start Losartan-htz 100mg -25mg  . Check BP at home, Keep a BP log and bring to next visit.  Continue regular excercise

## 2021-09-16 NOTE — Assessment & Plan Note (Signed)
Check CBC. asymptomatic

## 2021-10-21 DIAGNOSIS — E785 Hyperlipidemia, unspecified: Secondary | ICD-10-CM | POA: Diagnosis not present

## 2021-10-21 DIAGNOSIS — R7303 Prediabetes: Secondary | ICD-10-CM | POA: Diagnosis not present

## 2021-10-21 DIAGNOSIS — I10 Essential (primary) hypertension: Secondary | ICD-10-CM | POA: Diagnosis not present

## 2021-10-21 DIAGNOSIS — Z139 Encounter for screening, unspecified: Secondary | ICD-10-CM | POA: Diagnosis not present

## 2021-10-21 DIAGNOSIS — I1310 Hypertensive heart and chronic kidney disease without heart failure, with stage 1 through stage 4 chronic kidney disease, or unspecified chronic kidney disease: Secondary | ICD-10-CM | POA: Diagnosis not present

## 2021-10-21 DIAGNOSIS — N179 Acute kidney failure, unspecified: Secondary | ICD-10-CM | POA: Diagnosis not present

## 2021-10-21 DIAGNOSIS — D649 Anemia, unspecified: Secondary | ICD-10-CM | POA: Diagnosis not present

## 2021-10-22 LAB — CMP14+EGFR
ALT: 7 IU/L (ref 0–44)
AST: 17 IU/L (ref 0–40)
Albumin/Globulin Ratio: 1.5 (ref 1.2–2.2)
Albumin: 4.3 g/dL (ref 3.8–4.9)
Alkaline Phosphatase: 110 IU/L (ref 44–121)
BUN/Creatinine Ratio: 13 (ref 9–20)
BUN: 15 mg/dL (ref 6–24)
Bilirubin Total: 0.3 mg/dL (ref 0.0–1.2)
CO2: 20 mmol/L (ref 20–29)
Calcium: 9.2 mg/dL (ref 8.7–10.2)
Chloride: 104 mmol/L (ref 96–106)
Creatinine, Ser: 1.13 mg/dL (ref 0.76–1.27)
Globulin, Total: 2.8 g/dL (ref 1.5–4.5)
Glucose: 133 mg/dL — ABNORMAL HIGH (ref 70–99)
Potassium: 3.7 mmol/L (ref 3.5–5.2)
Sodium: 139 mmol/L (ref 134–144)
Total Protein: 7.1 g/dL (ref 6.0–8.5)
eGFR: 75 mL/min/{1.73_m2} (ref >59)

## 2021-10-22 LAB — LIPID PANEL
Chol/HDL Ratio: 5.2 ratio — ABNORMAL HIGH (ref 0.0–5.0)
Cholesterol, Total: 170 mg/dL (ref 100–199)
HDL: 33 mg/dL — ABNORMAL LOW (ref >39)
LDL Chol Calc (NIH): 104 mg/dL — ABNORMAL HIGH (ref 0–99)
Triglycerides: 187 mg/dL — ABNORMAL HIGH (ref 0–149)
VLDL Cholesterol Cal: 33 mg/dL (ref 5–40)

## 2021-10-22 LAB — VITAMIN D 25 HYDROXY (VIT D DEFICIENCY, FRACTURES): Vit D, 25-Hydroxy: 51.3 ng/mL (ref 30.0–100.0)

## 2021-10-22 LAB — CBC
Hematocrit: 37.3 % — ABNORMAL LOW (ref 37.5–51.0)
Hemoglobin: 12.4 g/dL — ABNORMAL LOW (ref 13.0–17.7)
MCH: 28.2 pg (ref 26.6–33.0)
MCHC: 33.2 g/dL (ref 31.5–35.7)
MCV: 85 fL (ref 79–97)
Platelets: 353 10*3/uL (ref 150–450)
RBC: 4.39 x10E6/uL (ref 4.14–5.80)
RDW: 14.2 % (ref 11.6–15.4)
WBC: 8.4 10*3/uL (ref 3.4–10.8)

## 2021-10-22 LAB — PSA: Prostate Specific Ag, Serum: 0.7 ng/mL (ref 0.0–4.0)

## 2021-10-22 LAB — HEMOGLOBIN A1C
Est. average glucose Bld gHb Est-mCnc: 123 mg/dL
Hgb A1c MFr Bld: 5.9 % — ABNORMAL HIGH (ref 4.8–5.6)

## 2021-10-22 LAB — TSH: TSH: 0.892 u[IU]/mL (ref 0.450–4.500)

## 2021-10-23 ENCOUNTER — Encounter (HOSPITAL_COMMUNITY): Payer: Self-pay | Admitting: Internal Medicine

## 2021-10-23 ENCOUNTER — Inpatient Hospital Stay (HOSPITAL_BASED_OUTPATIENT_CLINIC_OR_DEPARTMENT_OTHER): Payer: Medicare HMO

## 2021-10-23 ENCOUNTER — Other Ambulatory Visit: Payer: Self-pay

## 2021-10-23 ENCOUNTER — Observation Stay (HOSPITAL_COMMUNITY)
Admission: EM | Admit: 2021-10-23 | Discharge: 2021-10-24 | Disposition: A | Discharge status: Home / Self Care | Payer: Medicare HMO | Attending: Internal Medicine | Admitting: Internal Medicine

## 2021-10-23 ENCOUNTER — Emergency Department (HOSPITAL_COMMUNITY): Payer: Medicare HMO

## 2021-10-23 DIAGNOSIS — R2689 Other abnormalities of gait and mobility: Secondary | ICD-10-CM | POA: Insufficient documentation

## 2021-10-23 DIAGNOSIS — I10 Essential (primary) hypertension: Secondary | ICD-10-CM | POA: Diagnosis not present

## 2021-10-23 DIAGNOSIS — Z20822 Contact with and (suspected) exposure to covid-19: Secondary | ICD-10-CM | POA: Insufficient documentation

## 2021-10-23 DIAGNOSIS — R4781 Slurred speech: Secondary | ICD-10-CM | POA: Diagnosis not present

## 2021-10-23 DIAGNOSIS — I6603 Occlusion and stenosis of bilateral middle cerebral arteries: Secondary | ICD-10-CM | POA: Diagnosis not present

## 2021-10-23 DIAGNOSIS — M6281 Muscle weakness (generalized): Secondary | ICD-10-CM | POA: Diagnosis not present

## 2021-10-23 DIAGNOSIS — E785 Hyperlipidemia, unspecified: Secondary | ICD-10-CM

## 2021-10-23 DIAGNOSIS — I672 Cerebral atherosclerosis: Secondary | ICD-10-CM | POA: Diagnosis not present

## 2021-10-23 DIAGNOSIS — Z79899 Other long term (current) drug therapy: Secondary | ICD-10-CM | POA: Insufficient documentation

## 2021-10-23 DIAGNOSIS — I6930 Unspecified sequelae of cerebral infarction: Secondary | ICD-10-CM

## 2021-10-23 DIAGNOSIS — R531 Weakness: Secondary | ICD-10-CM | POA: Diagnosis not present

## 2021-10-23 DIAGNOSIS — R471 Dysarthria and anarthria: Secondary | ICD-10-CM | POA: Diagnosis not present

## 2021-10-23 DIAGNOSIS — Z8673 Personal history of transient ischemic attack (TIA), and cerebral infarction without residual deficits: Secondary | ICD-10-CM | POA: Diagnosis not present

## 2021-10-23 DIAGNOSIS — E876 Hypokalemia: Secondary | ICD-10-CM | POA: Diagnosis not present

## 2021-10-23 DIAGNOSIS — I639 Cerebral infarction, unspecified: Principal | ICD-10-CM | POA: Insufficient documentation

## 2021-10-23 DIAGNOSIS — R42 Dizziness and giddiness: Secondary | ICD-10-CM | POA: Diagnosis not present

## 2021-10-23 DIAGNOSIS — I6389 Other cerebral infarction: Secondary | ICD-10-CM

## 2021-10-23 DIAGNOSIS — N179 Acute kidney failure, unspecified: Secondary | ICD-10-CM | POA: Insufficient documentation

## 2021-10-23 DIAGNOSIS — R29705 NIHSS score 5: Secondary | ICD-10-CM

## 2021-10-23 DIAGNOSIS — R29818 Other symptoms and signs involving the nervous system: Secondary | ICD-10-CM | POA: Diagnosis not present

## 2021-10-23 DIAGNOSIS — R29810 Facial weakness: Secondary | ICD-10-CM | POA: Diagnosis not present

## 2021-10-23 DIAGNOSIS — I6523 Occlusion and stenosis of bilateral carotid arteries: Secondary | ICD-10-CM | POA: Diagnosis not present

## 2021-10-23 LAB — COMPREHENSIVE METABOLIC PANEL
ALT: 11 U/L (ref 0–44)
AST: 16 U/L (ref 15–41)
Albumin: 3.9 g/dL (ref 3.5–5.0)
Alkaline Phosphatase: 82 U/L (ref 38–126)
Anion gap: 7 (ref 5–15)
BUN: 29 mg/dL — ABNORMAL HIGH (ref 6–20)
CO2: 27 mmol/L (ref 22–32)
Calcium: 9.1 mg/dL (ref 8.9–10.3)
Chloride: 103 mmol/L (ref 98–111)
Creatinine, Ser: 1.76 mg/dL — ABNORMAL HIGH (ref 0.61–1.24)
GFR, Estimated: 44 mL/min — ABNORMAL LOW (ref >60)
Glucose, Bld: 120 mg/dL — ABNORMAL HIGH (ref 70–99)
Potassium: 2.9 mmol/L — ABNORMAL LOW (ref 3.5–5.1)
Sodium: 137 mmol/L (ref 135–145)
Total Bilirubin: 0.6 mg/dL (ref 0.3–1.2)
Total Protein: 7.5 g/dL (ref 6.5–8.1)

## 2021-10-23 LAB — URINALYSIS, ROUTINE W REFLEX MICROSCOPIC
Bilirubin Urine: NEGATIVE
Glucose, UA: NEGATIVE mg/dL
Hgb urine dipstick: NEGATIVE
Ketones, ur: NEGATIVE mg/dL
Leukocytes,Ua: NEGATIVE
Nitrite: NEGATIVE
Protein, ur: 30 mg/dL — AB
Specific Gravity, Urine: 1.031 — ABNORMAL HIGH (ref 1.005–1.030)
pH: 5 (ref 5.0–8.0)

## 2021-10-23 LAB — RESP PANEL BY RT-PCR (FLU A&B, COVID) ARPGX2
Influenza A by PCR: NEGATIVE
Influenza B by PCR: NEGATIVE
SARS Coronavirus 2 by RT PCR: NEGATIVE

## 2021-10-23 LAB — CBC
HCT: 37.1 % — ABNORMAL LOW (ref 39.0–52.0)
Hemoglobin: 12.1 g/dL — ABNORMAL LOW (ref 13.0–17.0)
MCH: 28.9 pg (ref 26.0–34.0)
MCHC: 32.6 g/dL (ref 30.0–36.0)
MCV: 88.5 fL (ref 80.0–100.0)
Platelets: 321 10*3/uL (ref 150–400)
RBC: 4.19 MIL/uL — ABNORMAL LOW (ref 4.22–5.81)
RDW: 15.2 % (ref 11.5–15.5)
WBC: 9.3 10*3/uL (ref 4.0–10.5)
nRBC: 0 % (ref 0.0–0.2)

## 2021-10-23 LAB — I-STAT CHEM 8, ED
BUN: 29 mg/dL — ABNORMAL HIGH (ref 6–20)
Calcium, Ion: 1.19 mmol/L (ref 1.15–1.40)
Chloride: 102 mmol/L (ref 98–111)
Creatinine, Ser: 1.9 mg/dL — ABNORMAL HIGH (ref 0.61–1.24)
Glucose, Bld: 123 mg/dL — ABNORMAL HIGH (ref 70–99)
HCT: 38 % — ABNORMAL LOW (ref 39.0–52.0)
Hemoglobin: 12.9 g/dL — ABNORMAL LOW (ref 13.0–17.0)
Potassium: 3 mmol/L — ABNORMAL LOW (ref 3.5–5.1)
Sodium: 140 mmol/L (ref 135–145)
TCO2: 28 mmol/L (ref 22–32)

## 2021-10-23 LAB — RAPID URINE DRUG SCREEN, HOSP PERFORMED
Amphetamines: NOT DETECTED
Barbiturates: NOT DETECTED
Benzodiazepines: NOT DETECTED
Cocaine: NOT DETECTED
Opiates: NOT DETECTED
Tetrahydrocannabinol: POSITIVE — AB

## 2021-10-23 LAB — DIFFERENTIAL
Abs Immature Granulocytes: 0.02 10*3/uL (ref 0.00–0.07)
Basophils Absolute: 0.1 10*3/uL (ref 0.0–0.1)
Basophils Relative: 1 %
Eosinophils Absolute: 0.1 10*3/uL (ref 0.0–0.5)
Eosinophils Relative: 1 %
Immature Granulocytes: 0 %
Lymphocytes Relative: 18 %
Lymphs Abs: 1.7 10*3/uL (ref 0.7–4.0)
Monocytes Absolute: 0.6 10*3/uL (ref 0.1–1.0)
Monocytes Relative: 6 %
Neutro Abs: 6.8 10*3/uL (ref 1.7–7.7)
Neutrophils Relative %: 74 %

## 2021-10-23 LAB — ECHOCARDIOGRAM COMPLETE
Area-P 1/2: 2.22 cm2
Height: 67 in
S' Lateral: 3.3 cm
Weight: 3104.08 oz

## 2021-10-23 LAB — ETHANOL: Alcohol, Ethyl (B): <10 mg/dL (ref <10)

## 2021-10-23 LAB — PROTIME-INR
INR: 1 (ref 0.8–1.2)
Prothrombin Time: 13.5 seconds (ref 11.4–15.2)

## 2021-10-23 LAB — APTT: aPTT: 29 seconds (ref 24–36)

## 2021-10-23 LAB — HIV ANTIBODY (ROUTINE TESTING W REFLEX): HIV Screen 4th Generation wRfx: NONREACTIVE

## 2021-10-23 LAB — GLUCOSE, CAPILLARY: Glucose-Capillary: 131 mg/dL — ABNORMAL HIGH (ref 70–99)

## 2021-10-23 MED ORDER — SODIUM CHLORIDE 0.9 % IV SOLN: INTRAVENOUS | Status: DC

## 2021-10-23 MED ORDER — METOPROLOL TARTRATE 25 MG PO TABS
25.0000 mg | ORAL_TABLET | Freq: Two times a day (BID) | ORAL | Status: DC
  Administered 2021-10-23: 25 mg via ORAL
  Filled 2021-10-23 (×3): qty 1

## 2021-10-23 MED ORDER — ASPIRIN EC 81 MG PO TBEC
81.0000 mg | DELAYED_RELEASE_TABLET | Freq: Every day | ORAL | Status: DC
  Administered 2021-10-24: 81 mg via ORAL
  Filled 2021-10-23: qty 1

## 2021-10-23 MED ORDER — IOHEXOL 350 MG/ML SOLN
100.0000 mL | Freq: Once | INTRAVENOUS | Status: AC | PRN
  Administered 2021-10-23: 60 mL via INTRAVENOUS

## 2021-10-23 MED ORDER — ACETAMINOPHEN 650 MG RE SUPP: 650.0000 mg | RECTAL | Status: DC | PRN

## 2021-10-23 MED ORDER — HYDRALAZINE HCL 25 MG PO TABS
100.0000 mg | ORAL_TABLET | Freq: Three times a day (TID) | ORAL | Status: DC
  Administered 2021-10-23 – 2021-10-24 (×3): 100 mg via ORAL
  Filled 2021-10-23 (×3): qty 4

## 2021-10-23 MED ORDER — POTASSIUM CHLORIDE CRYS ER 20 MEQ PO TBCR
20.0000 meq | EXTENDED_RELEASE_TABLET | Freq: Two times a day (BID) | ORAL | Status: DC
  Administered 2021-10-23: 20 meq via ORAL
  Filled 2021-10-23: qty 1

## 2021-10-23 MED ORDER — HYDRALAZINE HCL 20 MG/ML IJ SOLN: 10.0000 mg | INTRAMUSCULAR | Status: DC | PRN

## 2021-10-23 MED ORDER — ACETAMINOPHEN 325 MG PO TABS: 650.0000 mg | ORAL_TABLET | ORAL | Status: DC | PRN

## 2021-10-23 MED ORDER — STROKE: EARLY STAGES OF RECOVERY BOOK: Freq: Once | Status: AC

## 2021-10-23 MED ORDER — HEPARIN SODIUM (PORCINE) 5000 UNIT/ML IJ SOLN
5000.0000 [IU] | Freq: Three times a day (TID) | INTRAMUSCULAR | Status: DC
  Administered 2021-10-23 – 2021-10-24 (×3): 5000 [IU] via SUBCUTANEOUS
  Filled 2021-10-23 (×3): qty 1

## 2021-10-23 MED ORDER — VITAMIN D 25 MCG (1000 UNIT) PO TABS
2000.0000 [IU] | ORAL_TABLET | Freq: Every day | ORAL | Status: DC
  Administered 2021-10-24: 2000 [IU] via ORAL
  Filled 2021-10-23: qty 2

## 2021-10-23 MED ORDER — ACETAMINOPHEN 160 MG/5ML PO SOLN: 650.0000 mg | ORAL | Status: DC | PRN

## 2021-10-23 MED ORDER — ATORVASTATIN CALCIUM 40 MG PO TABS
40.0000 mg | ORAL_TABLET | Freq: Every evening | ORAL | Status: DC
  Administered 2021-10-23: 40 mg via ORAL
  Filled 2021-10-23: qty 1

## 2021-10-23 MED ORDER — POTASSIUM CHLORIDE 10 MEQ/100ML IV SOLN
10.0000 meq | INTRAVENOUS | Status: AC
  Administered 2021-10-23 (×2): 10 meq via INTRAVENOUS
  Filled 2021-10-23: qty 100

## 2021-10-23 NOTE — ED Triage Notes (Addendum)
Pt brought in by [facility] from home with c/o dizziness, slurred speech and weakness. LKW 2300 last night. Pt has left sided residual deficits from prior stroke. Dr. Roderic Palau at bedside to assess. Code Stroke called in the field. Code Stroke continued by Dr. Roderic Palau and pt taken directly to CT on EMS stretcher with Lynn Ito, RN at bedside.

## 2021-10-23 NOTE — Consult Note (Signed)
[facility] TELEMEDICINE  CONSULT   Date of service: October 23, 2021 Patient Name: Scott Ritter MRN:  [id number] DOB:  06-09-1961 Requesting Provider: Dr. Estell Harpin Location of the provider: [facility] Location of the patient: [facility] ED Reason for consult: "stroke"  This consult was provided via telemedicine with 2-way video and audio communication. The patient/family was informed that care would be provided in this way and agreed to receive care in this manner.   History of Present Illness   Scott Ritter is a 61 y.o. male with PMH significant for  has a past medical history of GERD (gastroesophageal reflux disease), Hypertension, and Stroke (with left face weakness and only mild left arm weakness) who presents with  worsening left arm sided weakness.  EMS reported SBP 160s  Stroke Measures   Last Known Well: 2300 10/22/2021 TPA Given: No, outside the window IR Thrombectomy: not indicated mRS: Modified Rankin Scale: 1-No significant post stroke disability and can perform usual duties with stroke symptoms Time of teleneurologist evaluation: 0920   Vitals   Vitals:   10/23/21 0913  BP: 132/87  Pulse: (!) 55  Resp: 18  SpO2: 94%     There is no height or weight on file to calculate BMI.   Tele-neuro Exam and NIHSS   General: NAD  LOC Responsiveness 0 LOC Questions 0 LOC Commands 0 Horizontal eye movement 0 Visual field 0 Facial palsy 1 Motor arm - Right arm 0 Motor arm - Left arm 2 Motor leg - Right leg 0 Motor leg - Left leg 0 Limb ataxia 0 Sensory test 1 Language 0 Speech 1 Extinction and inattention 0  NIHSS 5  Left upper extremity 2+/5 movement at the joint but only briefly against gravity. Fluent with intact comprehension and repetition. Mild dysarthria.  Imaging and Labs   CBC:  Recent Labs  Lab 10/21/21 0829  WBC 8.4  HGB 12.4*  HCT 37.3*  MCV 85  PLT 353    Basic Metabolic Panel:  Lab Results  Component Value Date   NA 139  10/21/2021   K 3.7 10/21/2021   CO2 20 10/21/2021   GLUCOSE 133 (H) 10/21/2021   BUN 15 10/21/2021   CREATININE 1.13 10/21/2021   CALCIUM 9.2 10/21/2021             Lipid Panel:  Lab Results  Component Value Date   LDLCALC 104 (H) 10/21/2021   HgbA1c:  Lab Results  Component Value Date   HGBA1C 5.9 (H) 10/21/2021   Alcohol Level No results found for: ETH  NCT head reviewed and showed no acute intracranial hemorrhage or evidence of acute infarction. ASPECT score 10. Right frontoparietal encephalomalacia reflecting sequelae of prior hemorrhage. Area of hypoattenuation is significantly larger as compared to NCT head 2009.  Impression   Scott Ritter is a 61 y.o. male with PMH significant for HLD, HTN, hemorrhagic stroke (2007) and residual left face weakness and only mild left upper extremity weakness now with worsened left upper extremity weakness. His limited tele-neurologic examination is notable for:  Left upper extremity 2+/5 movement at the joint but only briefly against gravity. Fluent with intact comprehension and repetition. Mild dysarthria. Blood pressure on evaluation 132/67  The last known normal was 2300 on 10/22/2021 and there is a prior history of hemorraghic stroke and therefore thrombolytics is contraindicated.  CT showed larger area of hypoattenuation which may reflect microangiopathic ischemic changes or extension, however cannot completely exclude recrudescence (though SBP 160s) and he will benefit  from MRI brain.   The patient has significant vascular risk factors and benefit of aspirin for secondary prevention outweighs risk and he would benefit from antiplatelet therapy. Aspirin 81mg  STAT CTA head and neck ordered to expedite stroke evaluation.  Recommendations   - MRI brain without contrast. - Recommend TTE - SBP goal <180 currently normotensive.  *No need to repeat: Current (10/21/2021) LDL 104 and goal <70 will need to optimize statin. Current  (10/21/2021) A1c 5.9 at goal <7  - Telemetry monitoring for arrhythmia. - Recommend bedside Swallow screen. - Recommend Stroke education. - Recommend PT/OT/SLP consult.  - Consider continuing aspirin 81mg  daily unless MRI suggests contraindication.  Electronically signed by:  Lynnae Sandhoff, MD Neurology Page: [phone] 10/23/2021, 9:23 AM  This patient is critically ill and at significant risk of neurological worsening, death and care requires constant monitoring of vital signs, hemodynamics,respiratory and cardiac monitoring, neurological assessment, discussion with family, other specialists and medical decision making of high complexity. I spent 73 minutes of neurocritical care time  in the care of  this patient. This was time spent independent of any time provided by nurse practitioner or PA.

## 2021-10-23 NOTE — Progress Notes (Signed)
Patient taken for MRI shortly after arrival to the floor from the ED. On return to the floor tele and condom cath placed, second swallow eval was passed and patient was started on 1 of 2 runs of potassium chloride. Patient has done well with swallowing oral medications. No complaints at this time.

## 2021-10-23 NOTE — ED Notes (Signed)
Pt attempted to use a urinal at this time with no success. Will attempt again later.

## 2021-10-23 NOTE — Progress Notes (Signed)
*  PRELIMINARY RESULTS* Echocardiogram 2D Echocardiogram has been performed.  Stacey Drain 10/23/2021, 2:44 PM

## 2021-10-23 NOTE — ED Notes (Signed)
Per Dr. Thomasena Edis, pt will be transported to [facility], ED to ED and Dr. Thomasena Edis will inform Neuro at [facility] regarding transfer.

## 2021-10-23 NOTE — H&P (Signed)
History and Physical    Scott Ritter [id number] DOB: Feb 23, 1961 DOA: 10/23/2021  PCP: Renee Rival, FNP   Patient coming from: Home  Chief Complaint: Dizziness and worsening left arm weakness  HPI: Scott Ritter is a 61 y.o. 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  He was last known normal around 2300 on 1/10.  He had no symptoms while he was going to bed last night.  He denies any new speech difficulties, headaches, or double vision.  He denies any lower extremity weakness.   ED Course: Vital signs stable and patient is afebrile.  Creatinine noted to be 1.9 and BUN 29.  CT of the head with no acute findings and CTA head and neck with no LVO. Teleneurology recommending brain MRI that is currently pending as well as aspirin 81 mg daily.  2D echocardiogram ordered as well.  Patient will need IV fluid and potassium supplementation.  Review of Systems: Reviewed as noted above, otherwise negative.  Past Medical History:  Diagnosis Date   GERD (gastroesophageal reflux disease)    Hypertension    Stroke [facility])    left side deficits    Past Surgical History:  Procedure Laterality Date   BACK SURGERY     BIOPSY  09/17/2018   Procedure: BIOPSY;  Surgeon: Danie Binder, MD;  Location: [facility]O SUITE;  Service: Endoscopy;;  duodenal biopsy    COLONOSCOPY N/A 02/29/2016   Dr. Oneida Alar: redundant colon, non-bleeding internal hemorrhoids    COLONOSCOPY WITH PROPOFOL N/A 04/09/2021   Procedure: COLONOSCOPY WITH PROPOFOL;  Surgeon: Eloise Harman, DO;  Location: [facility]O SU[facility];  Service: Endoscopy;  Laterality: N/A;  7:30am   ESOPHAGOGASTRODUODENOSCOPY N/A 12/17/2016   Dr. Oneida Alar: 3 mm nonbleeding  Mallory-Weiss tear.  EGD performed for hematemesis.  Hemoglobin normal.   ESOPHAGOGASTRODUODENOSCOPY N/A 02/19/2018   Dr. Gala Romney: Performed for melena, hemoglobin of 6.  Mucosal changes in the esophagus query short segment Barrett's, biopsy more consistent with reflux changes.  Erosive gastropathy but no H. pylori.  Duodenal erosions.  Suspected NSAID induced injury.   ESOPHAGOGASTRODUODENOSCOPY  08/18/2018   Dr. Laural Golden: IDA/heme positive stool.  Esophageal mucosal changes consistent with short segment Barrett's esophagus, not biopsied.  2 cm hiatal hernia.  Duodenal bulb, second portion of duodenum, third portion of the duodenum.  Video capsule somewhat difficult to pass through the oropharynx but was eventually advanced into the second portion of the duodenum and released.   ESOPHAGOGASTRODUODENOSCOPY N/A 08/18/2018   Procedure: ESOPHAGOGASTRODUODENOSCOPY (EGD);  Surgeon: Rogene Houston, MD;  Location[facility];  Service: Endoscopy;  Laterality: N/A;   ESOPHAGOGASTRODUODENOSCOPY N/A 09/17/2018   gastritis, 2 nonbleeding angiectasia's in the duodenum were treated with [facility] coagulation status post biopsy.   FOOT SURGERY Left    GIVENS CAPSULE STUDY N/A 08/18/2018   Procedure: GIVENS CAPSULE STUDY;  Surgeon: Rogene Houston, MD;  Location: [facility];  Service: Endoscopy;  Laterality: N/A;   HERNIA REPAIR     POLYPECTOMY  04/09/2021   Procedure: POLYPECTOMY;  Surgeon: Eloise Harman, DO;  Location: [facility];  Service: Endoscopy;;  descending; cecal     reports that he has never smoked. He has never used smokeless tobacco. He reports current alcohol use of about  1.0 standard drink per week. He reports that he does not use drugs.  No Known Allergies  Family History  Problem Relation Age of Onset   Hypertension Mother    Hypertension Father    Colon cancer Father    Colon polyps Neg Hx     Prior to Admission medications   Medication Sig Start Date End Date Taking?  Authorizing Provider  acetaminophen (TYLENOL) 500 MG tablet Take 500 mg by mouth every 6 (six) hours as needed for moderate pain.    [provider]  amLODipine (NORVASC) 10 MG tablet TAKE 1 TABLET EVERY DAY Patient taking differently: Take 10 mg by mouth daily. 02/15/21   Hurshel Party C, MD  atorvastatin (LIPITOR) 40 MG tablet TAKE 1 TABLET (40 MG TOTAL) BY MOUTH EVERY EVENING. 02/21/21   Hurshel Party C, MD  Cholecalciferol (VITAMIN D) 2000 units CAPS Take 2,000 Units by mouth daily.    [provider]  hydrALAZINE (APRESOLINE) 100 MG tablet Take 1 tablet (100 mg total) by mouth 3 (three) times daily. 05/16/21   Ailene Ards, NP  losartan-hydrochlorothiazide (HYZAAR) 100-25 MG tablet Take 1 tablet by mouth daily. 09/16/21   Renee Rival, FNP  metoprolol tartrate (LOPRESSOR) 25 MG tablet TAKE 1 TABLET TWICE DAILY Patient taking differently: Take 25 mg by mouth 2 (two) times daily. 02/15/21 09/16/21  Hurshel Party C, MD  potassium chloride SA (KLOR-CON) 20 MEQ tablet TAKE 1 TABLET TWICE DAILY Patient taking differently: Take 20 mEq by mouth daily. 05/16/21   Ailene Ards, NP    Physical Exam: Vitals:   10/23/21 0937 10/23/21 1003 10/23/21 1030 10/23/21 1205  BP:  (!) 143/80 (!) 158/81 (!) 152/81  Pulse:  70 (!) 54 (!) 54  Resp:  19 10   Temp: 98.1 F (36.7 C)   97.7 F (36.5 C)  TempSrc: Oral   Oral  SpO2:  96% 96% 99%    Constitutional: NAD, calm, comfortable Vitals:   10/23/21 0937 10/23/21 1003 10/23/21 1030 10/23/21 1205  BP:  (!) 143/80 (!) 158/81 (!) 152/81  Pulse:  70 (!) 54 (!) 54  Resp:  19 10   Temp: 98.1 F (36.7 C)   97.7 F (36.5 C)  TempSrc: Oral   Oral  SpO2:  96% 96% 99%   Eyes: lids and conjunctivae normal Neck: normal, supple Respiratory: clear to auscultation bilaterally. Normal respiratory effort. No accessory muscle use.  Cardiovascular: Regular rate and rhythm, no murmurs. Abdomen: no tenderness, no distention. Bowel sounds  positive.  Musculoskeletal:  No edema.  Left upper extremity 3/5.  Right upper extremity 5/5 Skin: no rashes, lesions, ulcers.  Psychiatric: Flat affect  Labs on Admission: I have personally reviewed following labs and imaging studies  CBC: Recent Labs  Lab 10/21/21 0829 10/23/21 0927 10/23/21 0930  WBC 8.4 9.3  --   NEUTROABS  --  6.8  --   HGB 12.4* 12.1* 12.9*  HCT 37.3* 37.1* 38.0*  MCV 85 88.5  --   PLT 353 321  --    Basic Metabolic Panel: Recent Labs  Lab 10/21/21 0829 10/23/21 0927 10/23/21 0930  NA 139 137 140  K 3.7 2.9* 3.0*  CL 104 103 102  CO2 20 27  --   GLUCOSE 133* 120* 123*  BUN 15 29* 29*  CREATININE 1.13 1.76* 1.90*  CALCIUM 9.2 9.1  --    GFR: CrCl cannot be calculated (Unknown ideal weight.). Liver Function Tests: Recent Labs  Lab 10/21/21  [id number] 10/23/21 0927  AST 17 16  ALT 7 11  ALKPHOS 110 82  BILITOT 0.3 0.6  PROT 7.1 7.5  ALBUMIN 4.3 3.9   No results for input(s): LIPASE, AMYLASE in the last 168 hours. No results for input(s): AMMONIA in the last 168 hours. Coagulation Profile: Recent Labs  Lab 10/23/21 0942  INR 1.0   Cardiac Enzymes: No results for input(s): CKTOTAL, CKMB, CKMBINDEX, TROPONINI in the last 168 hours. BNP (last 3 results) No results for input(s): PROBNP in the last 8760 hours. HbA1C: Recent Labs    10/21/21 0829  HGBA1C 5.9*   CBG: No results for input(s): GLUCAP in the last 168 hours. Lipid Profile: Recent Labs    10/21/21 0829  CHOL 170  HDL 33*  LDLCALC 104*  TRIG 187*  CHOLHDL 5.2*   Thyroid Function Tests: Recent Labs    10/21/21 0829  TSH 0.892   Anemia Panel: No results for input(s): VITAMINB12, FOLATE, FERRITIN, TIBC, IRON, RETICCTPCT in the last 72 hours. Urine analysis:    Component Value Date/Time   COLORURINE YELLOW 01/24/2019 1706   APPEARANCEUR CLEAR 01/24/2019 1706   LABSPEC 1.017 01/24/2019 1706   PHURINE 6.0 01/24/2019 1706   GLUCOSEU NEGATIVE 01/24/2019 1706    HGBUR NEGATIVE 01/24/2019 1706   BILIRUBINUR NEGATIVE 01/24/2019 1706   KETONESUR NEGATIVE 01/24/2019 1706   PROTEINUR NEGATIVE 01/24/2019 1706   UROBILINOGEN 1.0 11/01/2008 1425   NITRITE NEGATIVE 01/24/2019 1706   LEUKOCYTESUR NEGATIVE 01/24/2019 1706    Radiological Exams on Admission: CT HEAD CODE STROKE WO CONTRAST  Result Date: 10/23/2021 CLINICAL DATA:  Code stroke. Generalized weakness and slurred speech EXAM: CT HEAD WITHOUT CONTRAST TECHNIQUE: Contiguous axial images were obtained from the base of the skull through the vertex without intravenous contrast. RADIATION DOSE REDUCTION: This exam was performed according to the departmental dose-optimization program which includes automated exposure control, adjustment of the mA and/or kV according to patient size and/or use of iterative reconstruction technique. COMPARISON:  Most recent prior imaging is from 2009 FINDINGS: Brain: There is no acute intracranial hemorrhage, mass effect, or edema. Encephalomalacia involving the lateral right frontoparietal lobes with associated volume loss. Mild asymmetric left cerebellar volume loss is probably related. Minimal additional patchy hypoattenuation in the supratentorial white matter is nonspecific but may reflect minor chronic microvascular ischemic changes. There is no hydrocephalus or extra-axial collection. Vascular: No hyperdense vessel. Intracranial atherosclerotic calcification is present at the skull base. Skull: Unremarkable. Sinuses/Orbits: Patchy mild mucosal thickening. Orbits are unremarkable. Other: Mastoid air cells are clear. ASPECTS (Bruno Stroke Program Early CT Score) - Ganglionic level infarction (caudate, lentiform nuclei, internal capsule, insula, M1-M3 cortex): 7 - Supraganglionic infarction (M4-M6 cortex): 3 Total score (0-10 with 10 being normal): 10 IMPRESSION: There is no acute intracranial hemorrhage or evidence of acute infarction. ASPECT score is 10. Right frontoparietal  encephalomalacia reflecting sequelae of prior hemorrhage. These results were called by telephone at the time of interpretation on 10/23/2021 at 9:23 am to provider [facility] ZAMMIT , who verbally acknowledged these results. Electronically Signed   By: Macy Mis M.D.   On: 10/23/2021 09:26   CT ANGIO HEAD NECK W WO CM (CODE STROKE)  Result Date: 10/23/2021 CLINICAL DATA:  Generalized weakness with slurred speech. EXAM: CT ANGIOGRAPHY HEAD AND NECK TECHNIQUE: Multidetector CT imaging of the head and neck was performed using the standard protocol during bolus administration of intravenous contrast. Multiplanar CT image reconstructions and MIPs were obtained to evaluate the vascular anatomy. Carotid stenosis measurements (  when applicable) are obtained utilizing NASCET criteria, using the distal internal carotid diameter as the denominator. RADIATION DOSE REDUCTION: This exam was performed according to the departmental dose-optimization program which includes automated exposure control, adjustment of the mA and/or kV according to patient size and/or use of iterative reconstruction technique. CONTRAST:  64mL OMNIPAQUE IOHEXOL 350 MG/ML SOLN COMPARISON:  Head CT from earlier the same day FINDINGS: CTA NECK FINDINGS Aortic arch: Atheromatous plaque. Right carotid system: Mild plaque at the bifurcation without stenosis or ulceration. Left carotid system: Mild plaque at the bifurcation without stenosis or ulceration. Vertebral arteries: No proximal subclavian stenosis. Mild atheromatous narrowing at the origin of the non dominant right vertebral artery and at the right P2 segment. Skeleton: No emergent finding. Other neck: Negative Upper chest: Negative Review of the MIP images confirms the above findings CTA HEAD FINDINGS Anterior circulation: No emergent large vessel occlusion. Atheromatous irregularity of bilateral MCA branches with moderate to advanced right M2 and left M3 stenoses. Atheromatous calcification of the  carotid siphons. Negative for aneurysm Posterior circulation: Atheromatous plaque narrows the right V4 segment to a moderate degree. The left vertebral artery is dominant. Attenuated and atherosclerotic/irregular distal basilar. Fetal type right PCA. Extensive atheromatous irregularity of the bilateral PCA and its branches. Bilateral high-grade P2 segment stenosis. Negative for aneurysm. Venous sinuses: Patent Anatomic variants: As above Review of the MIP images confirms the above findings IMPRESSION: 1. No emergent finding. 2. Advanced intracranial atherosclerosis with high-grade bilateral PCA, right M2, and left M3 stenoses. 3. Comparatively mild atherosclerosis in the neck without flow limiting stenosis or visible embolic source. Electronically Signed   By: Jorje Guild M.D.   On: 10/23/2021 10:06    EKG: Independently reviewed.  SR 54 bpm  Assessment/Plan Principal Problem:   CVA (cerebral vascular accident) ([place])    TIA versus CVA -Noted to have prior history of hemorrhagic CVA with residual left upper extremity and facial weakness -CT head with no acute findings -Aspirin 81 mg daily per neurology recommendations -Brain MRI ordered and pending -TTE -Appreciate further neurology evaluation -PT/OT/SLP evaluation -A1c 5.9% on 1/9  Hypokalemia -Replete and reevaluate  AKI -Baseline creatinine near 1.1 -Continue IV fluid -Avoid nephrotoxic agents -Monitor I's and O's -Repeat a.m. labs  Dyslipidemia -Recent LDL 104 on 1/9 -Continue statin  Hypertension -Allow for permissive hypertension -Hold home BP meds -Hydralazine as needed for SBP 180 or greater  DVT prophylaxis: Heparin Code Status: Full Family Communication: None at bedside, patient lives with mother at home Disposition Plan: Admit for CVA evaluation Consults called: Neurology Admission status: Inpatient, telemetry  Jaslyne Beeck D Hisae Decoursey DO [facility]  If 7PM-7AM, please contact  night-coverage [email or web address]  10/23/2021, 12:06 PM

## 2021-10-23 NOTE — Plan of Care (Signed)

## 2021-10-23 NOTE — ED Notes (Signed)
Called and Beeped CODE STROKE

## 2021-10-23 NOTE — ED Notes (Signed)
Per Dr. Thomasena Edis Neurologist, pt is not a candidate for thrombolytics.

## 2021-10-23 NOTE — Progress Notes (Signed)
SLP Cancellation Note  Patient Details Name: Scott Ritter MRN: [id number] DOB: 07-Jul-1961   Cancelled treatment:       Reason Eval/Treat Not Completed: Patient at procedure or test/unavailable (Pt at MRI, consult received for speech/language evaluation. SLP will check back tomorrow. Nursing to administer the [place] swallow screen when Pt is alert enough to participate.)  Thank you,  Havery Moros, CCC-SLP [phone]    Deyja Sochacki 10/23/2021, 1:24 PM

## 2021-10-24 DIAGNOSIS — G459 Transient cerebral ischemic attack, unspecified: Secondary | ICD-10-CM

## 2021-10-24 DIAGNOSIS — I639 Cerebral infarction, unspecified: Secondary | ICD-10-CM | POA: Diagnosis not present

## 2021-10-24 LAB — BASIC METABOLIC PANEL
Anion gap: 7 (ref 5–15)
BUN: 19 mg/dL (ref 6–20)
CO2: 25 mmol/L (ref 22–32)
Calcium: 8.8 mg/dL — ABNORMAL LOW (ref 8.9–10.3)
Chloride: 108 mmol/L (ref 98–111)
Creatinine, Ser: 1.07 mg/dL (ref 0.61–1.24)
GFR, Estimated: >60 mL/min (ref >60)
Glucose, Bld: 91 mg/dL (ref 70–99)
Potassium: 3.1 mmol/L — ABNORMAL LOW (ref 3.5–5.1)
Sodium: 140 mmol/L (ref 135–145)

## 2021-10-24 LAB — CBC
HCT: 35.7 % — ABNORMAL LOW (ref 39.0–52.0)
Hemoglobin: 11.5 g/dL — ABNORMAL LOW (ref 13.0–17.0)
MCH: 28.6 pg (ref 26.0–34.0)
MCHC: 32.2 g/dL (ref 30.0–36.0)
MCV: 88.8 fL (ref 80.0–100.0)
Platelets: 320 10*3/uL (ref 150–400)
RBC: 4.02 MIL/uL — ABNORMAL LOW (ref 4.22–5.81)
RDW: 15.4 % (ref 11.5–15.5)
WBC: 8.6 10*3/uL (ref 4.0–10.5)
nRBC: 0 % (ref 0.0–0.2)

## 2021-10-24 LAB — MAGNESIUM: Magnesium: 2 mg/dL (ref 1.7–2.4)

## 2021-10-24 MED ORDER — POTASSIUM CHLORIDE CRYS ER 20 MEQ PO TBCR
40.0000 meq | EXTENDED_RELEASE_TABLET | Freq: Two times a day (BID) | ORAL | Status: DC
  Administered 2021-10-24: 40 meq via ORAL
  Filled 2021-10-24: qty 2

## 2021-10-24 MED ORDER — CLOPIDOGREL BISULFATE 75 MG PO TABS
75.0000 mg | ORAL_TABLET | Freq: Every day | ORAL | Status: DC
  Administered 2021-10-24: 75 mg via ORAL
  Filled 2021-10-24: qty 1

## 2021-10-24 MED ORDER — CLOPIDOGREL BISULFATE 75 MG PO TABS: 75.0000 mg | ORAL_TABLET | Freq: Every day | ORAL | 2 refills | Status: DC

## 2021-10-24 MED ORDER — ASPIRIN 81 MG PO TBEC: 81.0000 mg | DELAYED_RELEASE_TABLET | Freq: Every day | ORAL | 11 refills | Status: DC

## 2021-10-24 NOTE — Evaluation (Signed)
Physical Therapy Evaluation Patient Details Name: Scott Bruceorris N Livengood MRN: [id number] DOB: 1961/07/07 Today's Date: 10/24/2021  History of Present Illness  Scott Ritter is a 61 y.o. 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  He was last known normal around 2300 on 1/10.  He had no symptoms while he was going to bed last night.  He denies any new speech difficulties, headaches, or double vision.  He denies any lower extremity weakness.   Clinical Impression  Patient functioning at baseline for functional mobility and gait demonstrating good return for ambulation in room, hallways and on stairs without loss of balance.  Plan:  Patient discharged from physical therapy to care of nursing for ambulation daily as tolerated for length of stay.         Recommendations for follow up therapy are one component of a multi-disciplinary discharge planning process, led by the attending physician.  Recommendations may be updated based on patient status, additional functional criteria and insurance authorization.  Follow Up Recommendations No PT follow up    Assistance Recommended at Discharge PRN  Patient can return home with the following  Help with stairs or ramp for entrance    Equipment Recommendations None recommended by PT  Recommendations for Other Services       Functional Status Assessment Patient has not had a recent decline in their functional status     Precautions / Restrictions Precautions Precautions: Fall Restrictions Weight Bearing Restrictions: No      Mobility  Bed Mobility Overal bed mobility: Modified Independent             General bed mobility comments: Mild labored movement with use of  rail.    Transfers Overall transfer level: Modified independent Equipment used: Straight cane               General transfer comment: Pt able to transfer from EOB to functional ambulation in room and hall and end in chair. Pt using cane without physical assist. Pt able to complete standpivot without cane in room as well.    Ambulation/Gait Ambulation/Gait assistance: Modified independent (Device/Increase time) Gait Distance (Feet): 150 Feet Assistive device: Straight cane Gait Pattern/deviations: Step-to pattern;Decreased step length - left;Decreased stance time - right;Decreased stance time - left;Decreased stride length;Decreased dorsiflexion - left Gait velocity: decreased     General Gait Details: demonstrates good return for ambulation in room and hallways without loss of balance  Stairs Stairs: Yes Stairs assistance: Modified independent (Device/Increase time) Stair Management: One rail Left;One rail Right;Step to pattern Number of Stairs: 4 General stair comments: demonstrates good return going up/down steps in stairwell using 1 siderail without loss of balance  Wheelchair Mobility    Modified Rankin (Stroke Patients Only)       Balance Overall balance assessment: Needs assistance Sitting-balance support: Feet supported;No upper extremity supported Sitting balance-Leahy Scale: Good Sitting balance - Comments: seated EOB   Standing balance support: During functional activity;No upper extremity supported Standing balance-Leahy Scale: Fair Standing balance comment: fair/good using SPC                             Pertinent Vitals/Pain Pain Assessment: No/denies pain    Home Living Family/patient expects to be  discharged to:: Private residence Living Arrangements: Parent Available Help at Discharge: Family;Available PRN/intermittently Type of Home: House Home Access: Stairs to enter Entrance Stairs-Rails: Right;Left;Can reach both Entrance  Stairs-Number of Steps: 4   Home Layout: One level Home Equipment: Agricultural consultant (2 wheels);Cane - single point Additional Comments: Pt reports living with his mother who he takes care of. Pt's brother is available PRN.    Prior Function Prior Level of Function : Independent/Modified Independent             Mobility Comments: Pt reports household ambulation without AD. Pt uses cane for long distance ambulation. ADLs Comments: Pt reports independence with ADL's and IADL's.     Hand Dominance   Dominant Hand: Right    Extremity/Trunk Assessment   Upper Extremity Assessment Upper Extremity Assessment: Defer to OT evaluation RUE Deficits / Details: Pt able to functionally use R UE for dressing and mobility without difficulty. RUE Sensation: WNL RUE Coordination: WNL LUE Deficits / Details: Pt present with hemiplegic L UE at baseline from previous stroke. Pt demosntrates moderate flexor tone with 2+/5 MMT at the shoulder. Pt able to reach full elbow extension with progressive passive range of motion. Pt present with flexor tone in wrist and digits with hand in a fist. Pt able to be stretch into extension with time. 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 LLE: Unable to fully assess due to immobilization LLE Sensation: WNL LLE Coordination: WNL    Cervical / Trunk Assessment Cervical / Trunk Assessment: Normal  Communication   Communication: No difficulties  Cognition Arousal/Alertness: Awake/alert Behavior During Therapy: WFL for tasks assessed/performed Overall Cognitive Status: Within Functional Limits for tasks assessed                                          General Comments      Exercises      Assessment/Plan    PT Assessment Patient does not need any further PT services  PT Problem List         PT Treatment Interventions      PT Goals (Current goals can be found in the Care Plan section)  Acute Rehab PT Goals Patient Stated Goal: return home with family to assist PT Goal Formulation: With patient Time For Goal Achievement: 10/24/21 Potential to Achieve Goals: Good    Frequency       Co-evaluation PT/OT/SLP Co-Evaluation/Treatment: Yes Reason for Co-Treatment: To address functional/ADL transfers;Complexity of the patient's impairments (multi-system involvement) PT goals addressed during session: Mobility/safety with mobility;Balance;Proper use of DME OT goals addressed during session: ADL's and self-care       AM-PAC PT "6 Clicks" Mobility  Outcome Measure Help needed turning from your back to your side while in a flat bed without using bedrails?: None Help needed moving from lying on your back to sitting on the side of a flat bed without using bedrails?: None Help needed moving to and from a bed to a chair (including a wheelchair)?: None Help needed standing up from a chair using your arms (e.g., wheelchair or bedside chair)?: None Help needed to walk in hospital room?: None Help needed climbing 3-5 steps with a railing? : A Little 6 Click Score:  23    End of Session   Activity Tolerance: Patient tolerated treatment well Patient left: in chair;with call bell/phone within reach Nurse Communication: Mobility status PT Visit Diagnosis: Unsteadiness on feet (R26.81);Other abnormalities of gait and mobility (R26.89);Muscle weakness (generalized) (M62.81)    Time: 7829-5621 PT Time Calculation (min) (ACUTE ONLY): 30 min   Charges:   PT Evaluation $PT Eval Moderate Complexity: 1 Mod PT Treatments $Gait Training: 23-37 mins        11:11 AM, 10/24/21 Ocie Bob, MPT Physical Therapist with [facility] [phone] office [phone]  mobile phone

## 2021-10-24 NOTE — Progress Notes (Signed)
I connected with  Scott Ritter on 10/24/21 by a video enabled telemedicine application and verified that I am speaking with the correct person using two identifiers.   I discussed the limitations of evaluation and management by telemedicine. The patient expressed understanding and agreed to proceed.   Subjective: Patient states his symptoms have completely resolved.  Denies any new concerns.  ROS: negative except above Examination  Vital signs in last 24 hours: Temp:  [97.3 F (36.3 C)-98.2 F (36.8 C)] 97.4 F (36.3 C) (01/12 0926) Pulse Rate:  [50-61] 61 (01/12 0926) Resp:  [10-20] 20 (01/12 0926) BP: (125-187)/(69-83) 158/77 (01/12 0926) SpO2:  [96 %-99 %] 96 % (01/12 0926) Weight:  [88 kg] 88 kg (01/11 1205)  General: lying in bed, NAD CVS: pulse-normal rate and rhythm RS: breathing comfortably Psych: Pleasant, cooperative Neuro: Awake, alert, oriented to time place person, PERRLA, EOMI, left facial droop, 3/5 in left upper extremity distally, 2/5 in left upper extremity proximally, antigravity strength in right upper extremity without drift, FTN intact in right upper extremity  Basic Metabolic Panel: Recent Labs  Lab 10/21/21 0829 10/23/21 0927 10/23/21 0930 10/24/21 0445  NA 139 137 140 140  K 3.7 2.9* 3.0* 3.1*  CL 104 103 102 108  CO2 20 27  --  25  GLUCOSE 133* 120* 123* 91  BUN 15 29* 29* 19  CREATININE 1.13 1.76* 1.90* 1.07  CALCIUM 9.2 9.1  --  8.8*  MG  --   --   --  2.0    CBC: Recent Labs  Lab 10/21/21 0829 10/23/21 0927 10/23/21 0930 10/24/21 0445  WBC 8.4 9.3  --  8.6  NEUTROABS  --  6.8  --   --   HGB 12.4* 12.1* 12.9* 11.5*  HCT 37.3* 37.1* 38.0* 35.7*  MCV 85 88.5  --  88.8  PLT 353 321  --  320     Coagulation Studies: Recent Labs    10/23/21 0942  LABPROT 13.5  INR 1.0    Imaging  MRI brain without contrast 10/23/2021: No acute intracranial abnormality. Encephalomalacia from a remote right cerebral hemorrhage.  CTA  head and neck 10/23/2021: 2. Advanced intracranial atherosclerosis with high-grade bilateral PCA, right M2, and left M3 stenoses. Comparatively mild atherosclerosis in the neck without flow limiting stenosis or visible embolic source.  ASSESSMENT AND PLAN: 61 year old male with prior history of hemorrhagic stroke in 2017 gradual left-sided weakness who presented with worsening left upper extremity weakness.  Transient ischemic attack Intracranial stenosis Chronic hemorrhagic stroke Baseline left hemiparesis (present on admission) -MRI brain did not show any acute abnormality.  It is possible that patient most likely had either a transient ischemic attack or MRI negative stroke in the posterior circulation.  Patient blood pressure was 130/67.  Therefore less likely due to hypoperfusion.  Patient had weakness in leg as well as slurred speech, therefore less likely BPPV -Mechanism of stroke: Suspected small vessel disease  Recommendations -Patient was not taking any antiplatelets.  Recommend taking aspirin 81 mg daily.  Also recommend taking Plavix 75 mg daily for 90 days due to intracranial stenosis -Recommend atorvastatin 40 mg dailyfor secondary stroke prevention -Goal blood pressure normotension -Controlling risk factors including diabetes -Recommend follow-up with Dr. Merlene Laughter in 3 months -Discussed stroke risk factors as well as BEFAST with patient - Plan discussed with patient, bedside nurse as well as Dr. Manuella Ghazi  I have spent a total of  35 minutes with the patient reviewing hospital notes,  test results, labs and examining the patient as well as establishing an assessment and plan that was discussed personally with the patient.  > 50% of time was spent in direct patient care.   Zeb Comfort Epilepsy [facility] For questions after 5pm please refer to AMION to reach the Neurologist on call

## 2021-10-24 NOTE — Evaluation (Signed)
Occupational Therapy Evaluation Patient Details Name: Scott Ritter MRN: [id number] DOB: [birth year] Today's Date: 10/24/2021   History of Present Illness Scott Ritter is a 61 y.o. 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  He was last known normal around 2300 on 1/10.  He had no symptoms while he was going to bed last night.  He denies any new speech difficulties, headaches, or double vision.  He denies any lower extremity weakness.   Clinical Impression   Pt agreeable to OT and PT co-evaluation. Pt reports feeling back to baseline levels this date. Pt was able to complete dressing and transfers with modified independence. Pt able to transfer within room with cane, but used cane during functional ambulation in the hall. Pt presents with hemiplegic L UE at baseline. Pt is interested in outpatient OT for maintenance of L UE with possible splint due to flexor tone and pt report of not having a splint at home. Pt left in chair with call bell within reach. Pt is not recommended for further acute OT services and will be discharged to care of nursing staff for remaining length of stay.      Recommendations for follow up therapy are one component of a multi-disciplinary discharge planning process, led by the attending physician.  Recommendations may be updated based on patient status, additional functional criteria and insurance authorization.   Follow Up Recommendations  Outpatient OT (Per pt interest in education and maintenance of hemiplegic L UE.)    Assistance Recommended at Discharge PRN        Functional Status Assessment  Patient has not had a recent decline in their functional status  Equipment Recommendations   None recommended by OT    Recommendations for Other Services       Precautions / Restrictions Precautions Precautions: Fall Restrictions Weight Bearing Restrictions: No      Mobility Bed Mobility Overal bed mobility: Modified Independent             General bed mobility comments: Mild labored movement with use of rail.    Transfers Overall transfer level: Modified independent Equipment used: Straight cane               General transfer comment: Pt able to transfer from EOB to functional ambulation in room and hall and end in chair. Pt using cane without physical assist. Pt able to complete standpivot without cane in room as well.      Balance Overall balance assessment: Needs assistance Sitting-balance support: Feet supported;No upper extremity supported Sitting balance-Leahy Scale: Good Sitting balance - Comments: seated EOB   Standing balance support: Single extremity supported;During functional activity Standing balance-Leahy Scale: Fair Standing balance comment: using cane                           ADL either performed or assessed with clinical judgement   ADL Overall ADL's : Modified independent                                       General ADL Comments: Pt operating at or near baseline level for ADL's. Pt able to don socks  and pants without assist.     Vision Baseline Vision/History: 0 No visual deficits Ability to See in Adequate Light: 0 Adequate Patient Visual Report: No change from baseline Vision Assessment?: No apparent visual deficits                Pertinent Vitals/Pain Pain Assessment: No/denies pain     Hand Dominance Right   Extremity/Trunk Assessment Upper Extremity Assessment Upper Extremity Assessment: RUE deficits/detail;LUE deficits/detail RUE Deficits / Details: Pt able to functionally use R UE for dressing and mobility without difficulty. RUE Sensation: WNL RUE Coordination: WNL LUE  Deficits / Details: Pt present with hemiplegic L UE at baseline from previous stroke. Pt demosntrates moderate flexor tone with 2+/5 MMT at the shoulder. Pt able to reach full elbow extension with progressive passive range of motion. Pt present with flexor tone in wrist and digits with hand in a fist. Pt able to be stretch into extension with time. LUE Sensation: WNL LUE Coordination: decreased fine motor;decreased gross motor   Lower Extremity Assessment Lower Extremity Assessment: Defer to PT evaluation   Cervical / Trunk Assessment Cervical / Trunk Assessment: Normal   Communication Communication Communication: No difficulties   Cognition Arousal/Alertness: Awake/alert Behavior During Therapy: WFL for tasks assessed/performed Overall Cognitive Status: Within Functional Limits for tasks assessed                                                        Home Living Family/patient expects to be discharged to:: Private residence Living Arrangements: Parent Available Help at Discharge: Family;Available PRN/intermittently Type of Home: House Home Access: Stairs to enter [employer] of Steps: 4 Entrance Stairs-Rails: Right;Left;Can reach both Home Layout: One level     Bathroom Shower/Tub: Chief Strategy Officer: Standard     Home Equipment: Agricultural consultant (2 wheels);Cane - single point   Additional Comments: Pt reports living with his mother who he takes care of. Pt's brother is available PRN.      Prior Functioning/Environment Prior Level of Function : Independent/Modified Independent             Mobility Comments: Pt reports household ambulation without AD. Pt uses cane for long distance ambulation. ADLs Comments: Pt reports independence with ADL's and IADL's.                      OT Goals(Current goals can be found in the care plan section) Acute Rehab OT Goals Patient Stated Goal: return home  OT Frequency:       Co-evaluation PT/OT/SLP Co-Evaluation/Treatment: Yes Reason for Co-Treatment: To address functional/ADL transfers   OT goals addressed during session: ADL's and self-care                       End of Session Equipment Utilized During Treatment: Other (comment) (cane)  Activity Tolerance: Patient tolerated treatment well Patient left: in chair;with call bell/phone within reach  OT Visit Diagnosis: Unsteadiness on feet (R26.81);Muscle weakness (generalized) (M62.81);Dizziness and giddiness (R42)                Time: 6010-9323 OT Time Calculation (min): 24 min Charges:  OT General Charges $OT Visit: 1 Visit OT Evaluation $OT Eval Low Complexity: 1 Low  Scott Ritter OT, MOT  Danie Chandler 10/24/2021, 8:59  AM

## 2021-10-24 NOTE — Discharge Summary (Addendum)
Physician Discharge Summary  Scott Ritter [id number] DOB: 1961-04-11 DOA: 10/23/2021  PCP: Renee Rival, FNP  Admit date: 10/23/2021  Discharge date: 10/24/2021  Admitted From:Home  Disposition:  Home  Recommendations for Outpatient Follow-up:  Follow up with PCP in 1-2 weeks Follow up with Dr. Merlene Laughter as recommended in 3 months Continue on DAPT as prescribed with ASA and Plavix for 90 days and then ASA only thereafter Continue on other home medications as prior and noted below  Home Health:None  Equipment/Devices:  Discharge Condition:Stable  CODE STATUS: Full  Diet recommendation: Heart Healthy  Brief/Interim Summary: Scott Ritter is a 61 y.o. 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  He was admitted for possible TIA versus CVA and was not noted to have any acute findings on head CT or brain MRI.  It was thought that he might have recrudescence of his prior CVA.  He has been recommended to remain on dual antiplatelet therapy with aspirin and Plavix for 90 days and then aspirin only thereafter with recommendations to follow-up with Dr. Merlene Laughter in 3 months.  He is to otherwise remain on all of his other previous medications.  His AKI has also resolved.  PT/OT recommending outpatient evaluation.  Discharge Diagnoses:  Principal Problem:   CVA (cerebral vascular accident) [facility])  Principal discharge diagnosis: Dizziness and left upper extremity weakness likely due to recrudescence of prior CVA/TIA.  Discharge Instructions  Discharge Instructions     Diet - low sodium heart healthy   Complete by: As directed    Increase activity slowly   Complete by: As directed       Allergies as  of 10/24/2021   No Known Allergies      Medication List     TAKE these medications    acetaminophen 500 MG tablet Commonly known as: TYLENOL Take 500 mg by mouth every 6 (six) hours as needed for moderate pain.   amLODipine 10 MG tablet Commonly known as: NORVASC TAKE 1 TABLET EVERY DAY   aspirin 81 MG EC tablet Take 1 tablet (81 mg total) by mouth daily. Swallow whole. Start taking on: October 25, 2021   atorvastatin 40 MG tablet Commonly known as: LIPITOR TAKE 1 TABLET (40 MG TOTAL) BY MOUTH EVERY EVENING.   clopidogrel 75 MG tablet Commonly known as: PLAVIX Take 1 tablet (75 mg total) by mouth daily.   hydrALAZINE 100 MG tablet Commonly known as: APRESOLINE Take 1 tablet (100 mg total) by mouth 3 (three) times daily.   losartan-hydrochlorothiazide 100-25 MG tablet Commonly known as: HYZAAR Take 1 tablet by mouth daily.   metoprolol tartrate 25 MG tablet Commonly known as: LOPRESSOR TAKE 1 TABLET TWICE DAILY   potassium chloride SA 20 MEQ tablet Commonly known as: KLOR-CON M TAKE 1 TABLET TWICE DAILY What changed: when to take this   Vitamin D 50 MCG (2000 UT) Caps Take 2,000 Units by mouth daily.        Follow-up Information     Phillips Odor, MD. Schedule an appointment as soon as possible for a visit in 3 month(s).   Specialty: Neurology Contact information: [address] [address] [phone]                No Known Allergies  Consultations: Neurology   Procedures/Studies: MR BRAIN WO CONTRAST  Result Date: 10/23/2021 CLINICAL DATA:  Stroke follow-up. EXAM: MRI HEAD WITHOUT CONTRAST TECHNIQUE: Multiplanar, multiecho pulse sequences of the brain and surrounding structures were obtained without intravenous contrast. COMPARISON:  Head CT and CTA 10/23/2021 FINDINGS: Brain: There is no evidence of an acute infarct, mass, midline shift, or extra-axial fluid collection. Hemosiderin stained encephalomalacia is noted in the right  basal ganglia, corona radiata, insula, and operculum related to a remote hemorrhage. There is associated ex vacuo dilatation of the right lateral ventricle and wallerian degeneration with right cerebral peduncle volume loss. Scattered small T2 hyperintensities elsewhere in the cerebral white matter bilaterally are nonspecific but compatible with minimal chronic small vessel ischemic disease. Chronic microhemorrhages in the thalami and left basal ganglia suggest chronic hypertension. Vascular: Major intracranial vascular flow voids are preserved. Skull and upper cervical spine: Unremarkable bone marrow signal. Sinuses/Orbits: Unremarkable orbits. Minimal mucosal thickening in the paranasal sinuses. No significant mastoid fluid. Other: None. IMPRESSION: 1. No acute intracranial abnormality. 2. Encephalomalacia from a remote right cerebral hemorrhage. Electronically Signed   By: Logan Bores M.D.   On: 10/23/2021 13:54   ECHOCARDIOGRAM COMPLETE  Result Date: 10/23/2021    ECHOCARDIOGRAM REPORT   Patient Name:   Scott Ritter Date of Exam: 10/23/2021 Medical Rec #:  [id number]          Height:       67.0 in Accession #:    [id number]         Weight:       194.0 lb Date of Birth:  [birth year]          BSA:          1.996 m Patient Age:    61 years           BP:           152/81 mmHg Patient Gender: M                  HR:           54 bpm. Exam Location:  [facility] Procedure: 2D Echo, Cardiac Doppler and Color Doppler Indications:    I63.9 Stroke  History:        Patient has prior history of Echocardiogram examinations, most                 recent 01/25/2019. Risk Factors:Hypertension and Dyslipidemia.                 Acute renal failure superimposed on stage 2 chronic kidney                 disease, PSVT (paroxysmal supraventricular tachycardia), Hx of                 Stroke.  Sonographer:    Alvino Chapel RCS Referring Phys: [id number] [place]  1. Left ventricular ejection fraction, by estimation,  is 60 to 65%. The left ventricle has normal function. The left ventricle has no regional wall motion abnormalities. There is mild left ventricular hypertrophy. Left ventricular diastolic parameters are indeterminate.  2. Right ventricular systolic function is normal. The right ventricular size is normal. Tricuspid regurgitation signal is inadequate for assessing PA pressure.  3. Left atrial size was mildly dilated.  4. The mitral valve is normal in structure. No evidence of mitral valve regurgitation. No evidence of mitral stenosis.  5. The aortic valve is tricuspid. Aortic valve regurgitation is  not visualized. No aortic stenosis is present.  6. The inferior vena cava is normal in size with greater than 50% respiratory variability, suggesting right atrial pressure of 3 mmHg. FINDINGS  Left Ventricle: Left ventricular ejection fraction, by estimation, is 60 to 65%. The left ventricle has normal function. The left ventricle has no regional wall motion abnormalities. The left ventricular internal cavity size was normal in size. There is  mild left ventricular hypertrophy. Left ventricular diastolic parameters are indeterminate. Right Ventricle: The right ventricular size is normal. No increase in right ventricular wall thickness. Right ventricular systolic function is normal. Tricuspid regurgitation signal is inadequate for assessing PA pressure. Left Atrium: Left atrial size was mildly dilated. Right Atrium: Right atrial size was normal in size. Pericardium: There is no evidence of pericardial effusion. Mitral Valve: The mitral valve is normal in structure. No evidence of mitral valve regurgitation. No evidence of mitral valve stenosis. Tricuspid Valve: The tricuspid valve is normal in structure. Tricuspid valve regurgitation is trivial. Aortic Valve: The aortic valve is tricuspid. Aortic valve regurgitation is not visualized. No aortic stenosis is present. Pulmonic Valve: The pulmonic valve was not well visualized.  Pulmonic valve regurgitation is mild. Aorta: The aortic root is normal in size and structure. Venous: The inferior vena cava is normal in size with greater than 50% respiratory variability, suggesting right atrial pressure of 3 mmHg. IAS/Shunts: The interatrial septum was not well visualized.  LEFT VENTRICLE PLAX 2D LVIDd:         5.20 cm   Diastology LVIDs:         3.30 cm   LV e' medial:    5.11 cm/s LV PW:         1.10 cm   LV E/e' medial:  13.7 LV IVS:        1.00 cm   LV e' lateral:   8.70 cm/s LVOT diam:     2.00 cm   LV E/e' lateral: 8.0 LV SV:         63 LV SV Index:   31 LVOT Area:     3.14 cm  RIGHT VENTRICLE RV S prime:     13.50 cm/s TAPSE (M-mode): 2.6 cm LEFT ATRIUM             Index        RIGHT ATRIUM           Index LA diam:        4.20 cm 2.10 cm/m   RA Area:     21.10 cm LA Vol (A2C):   81.4 ml 40.78 ml/m  RA Volume:   59.10 ml  29.61 ml/m LA Vol (A4C):   72.2 ml 36.17 ml/m LA Biplane Vol: 78.0 ml 39.07 ml/m  AORTIC VALVE LVOT Vmax:   80.90 cm/s LVOT Vmean:  58.400 cm/s LVOT VTI:    0.199 m  AORTA Ao Root diam: 3.20 cm MITRAL VALVE MV Area (PHT): 2.22 cm    SHUNTS MV Decel Time: 342 msec    Systemic VTI:  0.20 m MV E velocity: 69.90 cm/s  Systemic Diam: 2.00 cm MV A velocity: 79.50 cm/s MV E/A ratio:  0.88 Oswaldo Milian MD Electronically signed by Oswaldo Milian MD Signature Date/Time: 10/23/2021/5:21:35 PM    Final    CT HEAD CODE STROKE WO CONTRAST  Result Date: 10/23/2021 CLINICAL DATA:  Code stroke. Generalized weakness and slurred speech EXAM: CT HEAD WITHOUT CONTRAST TECHNIQUE: Contiguous axial images were obtained from the base of the  skull through the vertex without intravenous contrast. RADIATION DOSE REDUCTION: This exam was performed according to the departmental dose-optimization program which includes automated exposure control, adjustment of the mA and/or kV according to patient size and/or use of iterative reconstruction technique. COMPARISON:  Most recent  prior imaging is from 2009 FINDINGS: Brain: There is no acute intracranial hemorrhage, mass effect, or edema. Encephalomalacia involving the lateral right frontoparietal lobes with associated volume loss. Mild asymmetric left cerebellar volume loss is probably related. Minimal additional patchy hypoattenuation in the supratentorial white matter is nonspecific but may reflect minor chronic microvascular ischemic changes. There is no hydrocephalus or extra-axial collection. Vascular: No hyperdense vessel. Intracranial atherosclerotic calcification is present at the skull base. Skull: Unremarkable. Sinuses/Orbits: Patchy mild mucosal thickening. Orbits are unremarkable. Other: Mastoid air cells are clear. ASPECTS (Alberta Stroke Program Early CT Score) - Ganglionic level infarction (caudate, lentiform nuclei, internal capsule, insula, M1-M3 cortex): 7 - Supraganglionic infarction (M4-M6 cortex): 3 Total score (0-10 with 10 being normal): 10 IMPRESSION: There is no acute intracranial hemorrhage or evidence of acute infarction. ASPECT score is 10. Right frontoparietal encephalomalacia reflecting sequelae of prior hemorrhage. These results were called by telephone at the time of interpretation on 10/23/2021 at 9:23 am to provider JOSEPH ZAMMIT , who verbally acknowledged these results. Electronically Signed   By: Praneil  Patel M.D.   On: 10/23/2021 09:26   CT ANGIO HEAD NECK W WO CM (CODE STROKE)  Result Date: 10/23/2021 CLINICAL DATA:  Generalized weakness with slurred speech. EXAM: CT ANGIOGRAPHY HEAD AND NECK TECHNIQUE: Multidetector CT imaging of the head and neck was performed using the standard protocol during bolus administration of intravenous contrast. Multiplanar CT image reconstructions and MIPs were obtained to evaluate the vascular anatomy. Carotid stenosis measurements (when applicable) are obtained utilizing NASCET criteria, using the distal internal carotid diameter as the denominator. RADIATION DOSE  REDUCTION: This exam was performed according to the departmental dose-optimization program which includes automated exposure control, adjustment of the mA and/or kV according to patient size and/or use of iterative reconstruction technique. CONTRAST: Pilar Plate1[place]0Weyer13KentucArlyce DiGlenwSaint Thomas West HoKoreaPilar Plate6[place]7Weyer13KentucArlyce DiMuscogee (Creek) Nation PhyCurahealth JacksonKoreaPilar Plate6[place]2Weyer13KentucArlyce DiSelect SpeciSpectrum Health United [facility] - United CKoreaPilar Plate4[place]0Weyer13KentucArlyce DiLiDartmouth Hitchcock Nashua [facility] CKoreaPilar Plate2Kentucky3Weyer13KentucArlyce DiWatts PlaGwinnett Advanced Surgery CenteKoreaPilar Plate5[place]3Weyer13KentucArlyce DiSt. VincChildren'S Hospital Navicent HKoreaPilar Plate6[place]6Weyer13KentucArlyce DiTriangle Innovative Eye Surgery CKoreaPilar Plate5[place]0Weyer13KentucArlyce DiSEgnm LLC Dba Lewes Surgery CKoreaPilar Plate7Kentucky0Weyer13KentucArlyce DiBarnet Dul[facility] CKoreaPilar Plate2Kentucky1Weyer13KentucArlyce DiHealthsouth RehabilitaUcsd Ambulatory Surgery CenteKoreaPilar Plate6[place]0Weyer13KentucArlyce DiUnioBaylor Scott & White Medical Center - HiLLKoreaPilar Plate3Kentucky7Weyer13KentucArlyce DiV [facility] DAdvanced Family Surgery CKoreaPilar Plate5[place]2Weyer13KentucArlyce DiSurgicalOtto Kaiser Memorial HoKoreaPilar Plate2[place]entucky5Weyer13KentucArlyce DiPleasEndoscopy Center Of Western ColoradKoreaPilar Plate5[place]entucky7Weyer13KentucArlyce DWest Monroe Endoscopy AKoreaPilar Plate2Kentucky5Weyer13KentucArlyce DiShriHilo Medical CKorePilar PlateeKentucky1Weyer13KentucArlyce DiChildrens HospOverlook HoKorePilar PlatepKentucky6Weyer13KentucACenter For EndoscopKoreaPilar Plate6Kentucky6Weyer13KentucArlyce DiDimmKings Daughters Medical CKoreaPilar Plate5[place]ent[place]1Weyer13KentucArlyce DiLifecareHealthcare Enterprises LLC Dba The Surgery CKoreaPilar Plate6[place]ent[place]8Weyer13KentucArlyce DiEye SurOchsner Medical CKoreaPilar Plate2[place]entucky7Weyer13KentucArlycBayne-Jones Army Community HoKoreaPilar Plate3[place]0Weyer13KentucArlycAlexandria Va Medical CKoreaPilar Plate4[place]entucky9Weyer13KentucArlyce DiSpAnmed Enterprises Inc [facility] InKoreaPilar Plate3Kentucky5Weyer13KentucArlyce DiJackson - MadiEncompass Health Rehab Hospital Of HuntiKoreaPilar Plate9[place]entucky7Weyer13KentucArlyce DiHoly Family [facility] Surgery CenteKoreaPilar Plate5Kentucky5Weyer13KentucArlyce DiWaRochester General HoKoreaPilar Plate5Kentucky8Weyer13KentucArlyce DiHilAdvanced Outpatient Surgery Of OklahomKoreaPilar Plate1[place]0Weyer13KentucArlyce DiNorthshore AmKessler Institute For Rehabilitation - West OKoreaPilar Plate8Kentucky0Weyer13KentucArlyce DSkyline Surgery CenteKoreaPilar Plate5Kentucky9Weyer13KentucArlyce DiSsReno Orthopaedic Surgery CenteKorear9Unisys Corporationre HospitalL SOLN COMPARISON:  Head CT from earlier the same day FINDINGS: CTA NECK FINDINGS Aortic arch: Atheromatous plaque. Right carotid system: Mild plaque at the bifurcation without stenosis or ulceration. Left carotid system: Mild plaque at the bifurcation without stenosis or ulceration. Vertebral arteries: No proximal subclavian stenosis. Mild atheromatous narrowing at the origin of the non dominant right vertebral artery and at the right P2 segment. Skeleton: No emergent finding. Other neck: Negative Upper chest: Negative Review of the MIP images confirms the above findings CTA HEAD FINDINGS Anterior circulation: No emergent large vessel occlusion. Atheromatous irregularity of bilateral MCA branches with moderate to advanced right M2 and left M3 stenoses. Atheromatous calcification of the carotid siphons. Negative for aneurysm Posterior circulation: Atheromatous plaque narrows the right V4 segment to a moderate degree. The left vertebral artery is dominant. Attenuated and atherosclerotic/irregular distal basilar. Fetal type right PCA. Extensive atheromatous irregularity of the bilateral PCA and its branches. Bilateral high-grade P2 segment stenosis. Negative for aneurysm. Venous sinuses: Patent Anatomic variants: As above Review of the MIP images confirms the above findings IMPRESSION: 1. No emergent finding. 2. Advanced intracranial atherosclerosis with high-grade bilateral PCA, right M2, and left M3 stenoses. 3. Comparatively mild atherosclerosis in the neck without flow limiting stenosis or visible embolic source. Electronically Signed   By: Jonathan  Watts M.D.   On: 10/23/2021 10:06     Discharge Exam: Vitals:  10/24/21 0404  10/24/21 0926  BP: (!) 166/82 (!) 158/77  Pulse: (!) 54 61  Resp: 16 20  Temp: (!) 97.3 F (36.3 C) (!) 97.4 F (36.3 C)  SpO2: 96% 96%   Vitals:   10/23/21 2200 10/24/21 0011 10/24/21 0404 10/24/21 0926  BP: (!) 156/74 (!) 154/71 (!) 166/82 (!) 158/77  Pulse: (!) 56 (!) 53 (!) 54 61  Resp: 20 16 16 20   Temp: 97.9 F (36.6 C) 97.7 F (36.5 C) (!) 97.3 F (36.3 C) (!) 97.4 F (36.3 C)  TempSrc: Oral Oral Oral Oral  SpO2: 96% 98% 96% 96%  Weight:      Height:        General: Pt is alert, awake, not in acute distress Cardiovascular: RRR, S1/S2 +, no rubs, no gallops Respiratory: CTA bilaterally, no wheezing, no rhonchi Abdominal: Soft, NT, ND, bowel sounds + Extremities: no edema, no cyanosis    The results of significant diagnostics from this hospitalization (including imaging, microbiology, ancillary and laboratory) are listed below for reference.     Microbiology: Recent Results (from the past 240 hour(s))  Resp Panel by RT-PCR (Flu A&B, Covid) Nasopharyngeal Swab     Status: None   Collection Time: 10/23/21  9:29 AM   Specimen: Nasopharyngeal Swab; Nasopharyngeal(NP) swabs in vial transport medium  Result Value Ref Range Status   SARS Coronavirus 2 by RT PCR NEGATIVE NEGATIVE Final    Comment: (NOTE) SARS-CoV-2 target nucleic acids are NOT DETECTED.  The SARS-CoV-2 RNA is generally detectable in upper respiratory specimens during the acute phase of infection. The lowest concentration of SARS-CoV-2 viral copies this assay can detect is 138 copies/mL. A negative result does not preclude SARS-Cov-2 infection and should not be used as the sole basis for treatment or other patient management decisions. A negative result may occur with  improper specimen collection/handling, submission of specimen other than nasopharyngeal swab, presence of viral mutation(s) within the areas targeted by this assay, and inadequate number of viral copies(<138 copies/mL). A negative  result must be combined with clinical observations, patient history, and epidemiological information. The expected result is Negative.  Fact Sheet for Patients:  EntrepreneurPulse.com.au  Fact Sheet for Healthcare Providers:  IncredibleEmployment.be  This test is no t yet approved or cleared by the [place] FDA and  has been authorized for detection and/or diagnosis of SARS-CoV-2 by FDA under an Emergency Use Authorization (EUA). This EUA will remain  in effect (meaning this test can be used) for the duration of the COVID-19 declaration under Section 564(b)(1) of the Act, 21 U.S.C.section 360bbb-3(b)(1), unless the authorization is terminated  or revoked sooner.       Influenza A by PCR NEGATIVE NEGATIVE Final   Influenza B by PCR NEGATIVE NEGATIVE Final    Comment: (NOTE) The Xpert Xpress SARS-CoV-2/FLU/RSV plus assay is intended as an aid in the diagnosis of influenza from Nasopharyngeal swab specimens and should not be used as a sole basis for treatment. Nasal washings and aspirates are unacceptable for Xpert Xpress SARS-CoV-2/FLU/RSV testing.  Fact Sheet for Patients: EntrepreneurPulse.com.au  Fact Sheet for Healthcare Providers: IncredibleEmployment.be  This test is not yet approved or cleared by the [place] FDA and has been authorized for detection and/or diagnosis of SARS-CoV-2 by FDA under an Emergency Use Authorization (EUA). This EUA will remain in effect (meaning this test can be used) for the duration of the COVID-19 declaration under Section 564(b)(1) of the Act, 21 U.S.C. section 360bbb-3(b)(1), unless the authorization is terminated  or revoked.  Performed at [facility], [address], [place], [place] [address]      Labs: BNP (last 3 results) No results for input(s): BNP in the last 8760 hours. Basic Metabolic Panel: Recent Labs  Lab 10/21/21 0829 10/23/21 0927  10/23/21 0930 10/24/21 0445  NA 139 137 140 140  K 3.7 2.9* 3.0* 3.1*  CL 104 103 102 108  CO2 20 27  --  25  GLUCOSE 133* 120* 123* 91  BUN 15 29* 29* 19  CREATININE 1.13 1.76* 1.90* 1.07  CALCIUM 9.2 9.1  --  8.8*  MG  --   --   --  2.0   Liver Function Tests: Recent Labs  Lab 10/21/21 0829 10/23/21 0927  AST 17 16  ALT 7 11  ALKPHOS 110 82  BILITOT 0.3 0.6  PROT 7.1 7.5  ALBUMIN 4.3 3.9   No results for input(s): LIPASE, AMYLASE in the last 168 hours. No results for input(s): AMMONIA in the last 168 hours. CBC: Recent Labs  Lab 10/21/21 0829 10/23/21 0927 10/23/21 0930 10/24/21 0445  WBC 8.4 9.3  --  8.6  NEUTROABS  --  6.8  --   --   HGB 12.4* 12.1* 12.9* 11.5*  HCT 37.3* 37.1* 38.0* 35.7*  MCV 85 88.5  --  88.8  PLT 353 321  --  320   Cardiac Enzymes: No results for input(s): CKTOTAL, CKMB, CKMBINDEX, TROPONINI in the last 168 hours. BNP: Invalid input(s): POCBNP CBG: Recent Labs  Lab 10/23/21 0910  GLUCAP 131*   D-Dimer No results for input(s): DDIMER in the last 72 hours. Hgb A1c No results for input(s): HGBA1C in the last 72 hours. Lipid Profile No results for input(s): CHOL, HDL, LDLCALC, TRIG, CHOLHDL, LDLDIRECT in the last 72 hours. Thyroid function studies No results for input(s): TSH, T4TOTAL, T3FREE, THYROIDAB in the last 72 hours.  Invalid input(s): FREET3 Anemia work up No results for input(s): VITAMINB12, FOLATE, FERRITIN, TIBC, IRON, RETICCTPCT in the last 72 hours. Urinalysis    Component Value Date/Time   COLORURINE YELLOW 10/23/2021 1417   APPEARANCEUR CLEAR 10/23/2021 1417   LABSPEC 1.031 (H) 10/23/2021 1417   PHURINE 5.0 10/23/2021 1417   GLUCOSEU NEGATIVE 10/23/2021 1417   HGBUR NEGATIVE 10/23/2021 1417   BILIRUBINUR NEGATIVE 10/23/2021 1417   KETONESUR NEGATIVE 10/23/2021 1417   PROTEINUR 30 (A) 10/23/2021 1417   UROBILINOGEN 1.0 11/01/2008 1425   NITRITE NEGATIVE 10/23/2021 1417   LEUKOCYTESUR NEGATIVE 10/23/2021  1417   Sepsis Labs Invalid input(s): PROCALCITONIN,  WBC,  LACTICIDVEN Microbiology Recent Results (from the past 240 hour(s))  Resp Panel by RT-PCR (Flu A&B, Covid) Nasopharyngeal Swab     Status: None   Collection Time: 10/23/21  9:29 AM   Specimen: Nasopharyngeal Swab; Nasopharyngeal(NP) swabs in vial transport medium  Result Value Ref Range Status   SARS Coronavirus 2 by RT PCR NEGATIVE NEGATIVE Final    Comment: (NOTE) SARS-CoV-2 target nucleic acids are NOT DETECTED.  The SARS-CoV-2 RNA is generally detectable in upper respiratory specimens during the acute phase of infection. The lowest concentration of SARS-CoV-2 viral copies this assay can detect is 138 copies/mL. A negative result does not preclude SARS-Cov-2 infection and should not be used as the sole basis for treatment or other patient management decisions. A negative result may occur with  improper specimen collection/handling, submission of specimen other than nasopharyngeal swab, presence of viral mutation(s) within the areas targeted by this assay, and inadequate number of viral copies(<138  copies/mL). A negative result must be combined with clinical observations, patient history, and epidemiological information. The expected result is Negative.  Fact Sheet for Patients:  EntrepreneurPulse.com.au  Fact Sheet for Healthcare Providers:  IncredibleEmployment.be  This test is no t yet approved or cleared by the [place] FDA and  has been authorized for detection and/or diagnosis of SARS-CoV-2 by FDA under an Emergency Use Authorization (EUA). This EUA will remain  in effect (meaning this test can be used) for the duration of the COVID-19 declaration under Section 564(b)(1) of the Act, 21 U.S.C.section 360bbb-3(b)(1), unless the authorization is terminated  or revoked sooner.       Influenza A by PCR NEGATIVE NEGATIVE Final   Influenza B by PCR NEGATIVE NEGATIVE Final     Comment: (NOTE) The Xpert Xpress SARS-CoV-2/FLU/RSV plus assay is intended as an aid in the diagnosis of influenza from Nasopharyngeal swab specimens and should not be used as a sole basis for treatment. Nasal washings and aspirates are unacceptable for Xpert Xpress SARS-CoV-2/FLU/RSV testing.  Fact Sheet for Patients: EntrepreneurPulse.com.au  Fact Sheet for Healthcare Providers: IncredibleEmployment.be  This test is not yet approved or cleared by the [place] FDA and has been authorized for detection and/or diagnosis of SARS-CoV-2 by FDA under an Emergency Use Authorization (EUA). This EUA will remain in effect (meaning this test can be used) for the duration of the COVID-19 declaration under Section 564(b)(1) of the Act, 21 U.S.C. section 360bbb-3(b)(1), unless the authorization is terminated or revoked.  Performed at [facility], [address], [place], [place] [address]      Time coordinating discharge: 35 minutes  SIGNED:   Rodena Goldmann, DO [facility] 10/24/2021, 10:30 AM  If 7PM-7AM, please contact night-coverage [email or web address]

## 2021-10-24 NOTE — Care Management Obs Status (Signed)
[place] NOTIFICATION   Patient Details  Name: Scott Ritter MRN: [id number] Date of Birth: 04-27-1961   Medicare Observation Status Notification Given:  Yes    Boneta Lucks, RN 10/24/2021, 11:01 AM

## 2021-10-24 NOTE — Care Management CC44 (Signed)
Condition Code 44 Documentation Completed  Patient Details  Name: MARTY UY MRN: [id number] Date of Birth: 1961/09/30   Condition Code 44 given:  Yes Patient signature on Condition Code 44 notice:  Yes Documentation of 2 MD's agreement:  Yes Code 44 added to claim:  Yes    Leitha Bleak, RN 10/24/2021, 11:01 AM

## 2021-10-24 NOTE — Progress Notes (Signed)
Out patient PT/OT ordered. Patient requested AP outpatient.

## 2021-10-26 NOTE — ED Provider Notes (Signed)
[facility] UNIT Provider Note   CSN: [id number] Arrival date & time: 10/23/21  [id number]     History  Chief Complaint  Patient presents with   Code Stroke    Scott Ritter is a 61 y.o. male.  Patient presents to the emergency department with dizziness slurred speech and left arm weakness.  His last known well was 11 PM.  Patient had left arm weakness with prior stroke and has a history of hypertension   Weakness Severity:  Mild Onset quality:  Sudden Timing:  Constant Progression:  Worsening Chronicity:  Recurrent Context: not alcohol use   Relieved by:  Nothing Worsened by:  Nothing Ineffective treatments:  None tried Associated symptoms: no abdominal pain, no chest pain, no cough, no diarrhea, no frequency, no headaches and no seizures       Home Medications Prior to Admission medications   Medication Sig Start Date End Date Taking? Authorizing Provider  acetaminophen (TYLENOL) 500 MG tablet Take 500 mg by mouth every 6 (six) hours as needed for moderate pain.   Yes [provider]  amLODipine (NORVASC) 10 MG tablet TAKE 1 TABLET EVERY DAY Patient taking differently: Take 10 mg by mouth daily. 02/15/21  Yes Gosrani, Nimish C, MD  Cholecalciferol (VITAMIN D) 2000 units CAPS Take 2,000 Units by mouth daily.   Yes [provider]  hydrALAZINE (APRESOLINE) 100 MG tablet Take 1 tablet (100 mg total) by mouth 3 (three) times daily. 05/16/21  Yes Ailene Ards, NP  losartan-hydrochlorothiazide (HYZAAR) 100-25 MG tablet Take 1 tablet by mouth daily. 09/16/21  Yes Paseda, Dewaine Conger, FNP  metoprolol tartrate (LOPRESSOR) 25 MG tablet TAKE 1 TABLET TWICE DAILY Patient taking differently: Take 25 mg by mouth 2 (two) times daily. 02/15/21 10/24/21 Yes Gosrani, Nimish C, MD  potassium chloride SA (KLOR-CON) 20 MEQ tablet TAKE 1 TABLET TWICE DAILY Patient taking differently: Take 20 mEq by mouth daily. 05/16/21  Yes Ailene Ards, NP  aspirin EC 81 MG EC  tablet Take 1 tablet (81 mg total) by mouth daily. Swallow whole. 10/25/21   Manuella Ghazi, Pratik D, DO  atorvastatin (LIPITOR) 40 MG tablet TAKE 1 TABLET (40 MG TOTAL) BY MOUTH EVERY EVENING. 02/21/21   Hurshel Party C, MD  clopidogrel (PLAVIX) 75 MG tablet Take 1 tablet (75 mg total) by mouth daily. 10/24/21 01/22/22  Heath Lark D, DO      Allergies    Patient has no known allergies.    Review of Systems   Review of Systems  Constitutional:  Negative for appetite change and fatigue.  HENT:  Negative for congestion, ear discharge and sinus pressure.   Eyes:  Negative for discharge.  Respiratory:  Negative for cough.   Cardiovascular:  Negative for chest pain.  Gastrointestinal:  Negative for abdominal pain and diarrhea.  Genitourinary:  Negative for frequency and hematuria.  Musculoskeletal:  Negative for back pain.  Skin:  Negative for rash.  Neurological:  Positive for weakness. Negative for seizures and headaches.       Slurred speech dizziness left arm weakness  Psychiatric/Behavioral:  Negative for hallucinations.    Physical Exam Updated Vital Signs BP (!) 158/77 (BP Location: Right Arm)    Pulse 61    Temp (!) 97.4 F (36.3 C) (Oral)    Resp 20    Ht 5\' 7"  (1.702 m)    Wt 88 kg    SpO2 96%    BMI 30.39 kg/m  Physical Exam Vitals and  nursing note reviewed.  Constitutional:      Appearance: He is well-developed.  HENT:     Head: Normocephalic.     Nose: Nose normal.  Eyes:     General: No scleral icterus.    Conjunctiva/sclera: Conjunctivae normal.  Neck:     Thyroid: No thyromegaly.  Cardiovascular:     Rate and Rhythm: Normal rate and regular rhythm.     Heart sounds: No murmur heard.   No friction rub. No gallop.  Pulmonary:     Breath sounds: No stridor. No wheezing or rales.  Chest:     Chest wall: No tenderness.  Abdominal:     General: There is no distension.     Tenderness: There is no abdominal tenderness. There is no rebound.  Musculoskeletal:         General: Normal range of motion.     Cervical back: Neck supple.  Lymphadenopathy:     Cervical: No cervical adenopathy.  Skin:    Findings: No erythema or rash.  Neurological:     Mental Status: He is alert and oriented to person, place, and time.     Motor: No abnormal muscle tone.     Coordination: Coordination normal.     Comments: Left arm weakness that is old and some slurred speech  Psychiatric:        Behavior: Behavior normal.    ED Results / Procedures / Treatments   Labs (all labs ordered are listed, but only abnormal results are displayed) Labs Reviewed  CBC - Abnormal; Notable for the following components:      Result Value   RBC 4.19 (*)    Hemoglobin 12.1 (*)    HCT 37.1 (*)    All other components within normal limits  COMPREHENSIVE METABOLIC PANEL - Abnormal; Notable for the following components:   Potassium 2.9 (*)    Glucose, Bld 120 (*)    BUN 29 (*)    Creatinine, Ser 1.76 (*)    GFR, Estimated 44 (*)    All other components within normal limits  RAPID URINE DRUG SCREEN, HOSP PERFORMED - Abnormal; Notable for the following components:   Tetrahydrocannabinol POSITIVE (*)    All other components within normal limits  URINALYSIS, ROUTINE W REFLEX MICROSCOPIC - Abnormal; Notable for the following components:   Specific Gravity, Urine 1.031 (*)    Protein, ur 30 (*)    Bacteria, UA RARE (*)    All other components within normal limits  GLUCOSE, CAPILLARY - Abnormal; Notable for the following components:   Glucose-Capillary 131 (*)    All other components within normal limits  CBC - Abnormal; Notable for the following components:   RBC 4.02 (*)    Hemoglobin 11.5 (*)    HCT 35.7 (*)    All other components within normal limits  BASIC METABOLIC PANEL - Abnormal; Notable for the following components:   Potassium 3.1 (*)    Calcium 8.8 (*)    All other components within normal limits  I-STAT CHEM 8, ED - Abnormal; Notable for the following components:    Potassium 3.0 (*)    BUN 29 (*)    Creatinine, Ser 1.90 (*)    Glucose, Bld 123 (*)    Hemoglobin 12.9 (*)    HCT 38.0 (*)    All other components within normal limits  RESP PANEL BY RT-PCR (FLU A&B, COVID) ARPGX2  ETHANOL  DIFFERENTIAL  PROTIME-INR  APTT  HIV ANTIBODY (ROUTINE TESTING W REFLEX)  MAGNESIUM    EKG EKG Interpretation  Date/Time:  Wednesday October 23 2021 09:27:35 EST Ventricular Rate:  54 PR Interval:  207 QRS Duration: 115 QT Interval:  433 QTC Calculation: 411 R Axis:   60 Text Interpretation: Sinus rhythm Atrial premature complex Borderline prolonged PR interval Left atrial enlargement Nonspecific intraventricular conduction delay Probable anteroseptal infarct, old Abnormal T, consider ischemia, diffuse leads Baseline wander in lead(s) V2 Confirmed by Thayer Jew [phone]) on 10/24/2021 11:05:26 AM  Radiology No results found.  Procedures Procedures    Medications Ordered in ED Medications  iohexol (OMNIPAQUE) 350 MG/ML injection 100 mL (60 mLs Intravenous Contrast Given 10/23/21 0952)   stroke: mapping our early stages of recovery book ( Does not apply Given 10/23/21 1521)  potassium chloride 10 mEq in 100 mL IVPB (0 mEq Intravenous Stopped 10/23/21 1752)    ED Course/ Medical Decision Making/ A&P  CRITICAL CARE Performed by: Milton Ferguson Total critical care time: 45 minutes Critical care time was exclusive of separately billable procedures and treating other patients. Critical care was necessary to treat or prevent imminent or life-threatening deterioration. Critical care was time spent personally by me on the following activities: development of treatment plan with patient and/or surrogate as well as nursing, discussions with consultants, evaluation of patient's response to treatment, examination of patient, obtaining history from patient or surrogate, ordering and performing treatments and interventions, ordering and review of laboratory  studies, ordering and review of radiographic studies, pulse oximetry and re-evaluation of patient's condition. Code stroke was called.  Patient was seen by neurology.  No tPA was given.  Patient was admitted to medicine for further work-up                          Medical Decision Making  Slurred speech and dizziness possible stroke.  Patient seen by neurology admitted to medicine    This patient presents to the ED for concern of slurred speech and dizziness, this involves an extensive number of treatment options, and is a complaint that carries with it a high risk of complications and morbidity.  The differential diagnosis includes hemorrhagic stroke, thrombotic stroke, seizure, tumor in the head   Co morbidities that complicate the patient evaluation  Hypertension old stroke   Additional history obtained:  Additional history obtained from family External records from outside source obtained and reviewed including old records   Lab Tests:  I Ordered, and personally interpreted labs.  The pertinent results include: Patient potassium and hemoglobin was slightly low   Imaging Studies ordered:  I ordered imaging studies including CT head that did not show any new strokes, MRI of the head also did not show new stroke I independently visualized and interpreted imaging which showed no new strokes I agree with the radiologist interpretation   Cardiac Monitoring:  The patient was maintained on a cardiac monitor.  I personally viewed and interpreted the cardiac monitored which showed an underlying rhythm of: Sinus bradycardia   Medicines ordered and prescription drug management:  No medicine Reevaluation of the patient after these medicines showed that the patient improved I have reviewed the patients home medicines and have made adjustments as needed   Test Considered:  EEG   Critical Interventions:  Neurology consult   Consultations Obtained:  I requested  consultation with the neurology,  and discussed lab and imaging findings as well as pertinent plan - they recommend: MRI and admission   Problem List / ED Course:  Weakness and slurred speech   Reevaluation:  After the interventions noted above, I reevaluated the patient and found that they have :improved   Social Determinants of Health:  Old stroke   Dispostion:  After consideration of the diagnostic results and the patients response to treatment, I feel that the patent would benefit from admission to medicine with neurology consult.         Final Clinical Impression(s) / ED Diagnoses Final diagnoses:  Cerebrovascular accident (CVA), unspecified mechanism ([place])    Rx / DC Orders ED Discharge Orders          Ordered    aspirin EC 81 MG EC tablet  Daily        10/24/21 1030    clopidogrel (PLAVIX) 75 MG tablet  Daily        10/24/21 1030    Increase activity slowly        10/24/21 1030    Diet - low sodium heart healthy        10/24/21 1030    Ambulatory referral to Physical Therapy       Comments: PT/OT   10/24/21 1103              Milton Ferguson, MD 10/26/21 [id number]

## 2021-10-28 ENCOUNTER — Ambulatory Visit (INDEPENDENT_AMBULATORY_CARE_PROVIDER_SITE_OTHER): Payer: Medicare HMO | Admitting: Nurse Practitioner

## 2021-10-28 ENCOUNTER — Other Ambulatory Visit: Payer: Self-pay

## 2021-10-28 ENCOUNTER — Other Ambulatory Visit (INDEPENDENT_AMBULATORY_CARE_PROVIDER_SITE_OTHER): Payer: Self-pay | Admitting: Nurse Practitioner

## 2021-10-28 ENCOUNTER — Encounter: Payer: Self-pay | Admitting: Nurse Practitioner

## 2021-10-28 VITALS — BP 128/82 | HR 60 | Ht 67.0 in | Wt 196.0 lb

## 2021-10-28 DIAGNOSIS — R7303 Prediabetes: Secondary | ICD-10-CM | POA: Diagnosis not present

## 2021-10-28 DIAGNOSIS — E669 Obesity, unspecified: Secondary | ICD-10-CM

## 2021-10-28 DIAGNOSIS — E782 Mixed hyperlipidemia: Secondary | ICD-10-CM

## 2021-10-28 DIAGNOSIS — E876 Hypokalemia: Secondary | ICD-10-CM

## 2021-10-28 DIAGNOSIS — I10 Essential (primary) hypertension: Secondary | ICD-10-CM | POA: Diagnosis not present

## 2021-10-28 MED ORDER — ATORVASTATIN CALCIUM 80 MG PO TABS: 80.0000 mg | ORAL_TABLET | Freq: Every day | ORAL | 3 refills | Status: DC

## 2021-10-28 NOTE — Assessment & Plan Note (Addendum)
DASH diet and commitment to daily physical activity for a minimum of 30 minutes discussed and encouraged, as a part of hypertension management. The importance of attaining a healthy weight is also discussed.  BP/Weight 10/28/2021 10/24/2021 10/23/2021 09/16/2021 04/09/2021 04/05/2021 0000000  Systolic BP 0000000 0000000 - [id number]  Diastolic BP 82 77 - 77 78 77 80  Wt. (Lbs) 196 - 194.01 201 205.91 205.91 206  BMI 30.7 - 30.39 31.48 32.25 32.25 32.26  continue current medications,

## 2021-10-28 NOTE — Assessment & Plan Note (Signed)
Lab Results  Component Value Date   HGBA1C 5.9 (H) 10/21/2021  avoid sugar, soda, sweet.

## 2021-10-28 NOTE — Patient Instructions (Addendum)
Please get your labs done 3-5 day before your next appointment  Start taking atorvastatin 80mg  daily for your cholesterol  Please get your shingles and TDAP vaccine at your pharmacy.   It is important that you exercise regularly at least 30 minutes 5 times a week.  Think about what you will eat, plan ahead. Choose " clean, green, fresh or frozen" over canned, processed or packaged foods which are more sugary, salty and fatty. 70 to 75% of food eaten should be vegetables and fruit. Three meals at set times with snacks allowed between meals, but they must be fruit or vegetables. Aim to eat over a 12 hour period , example 7 am to 7 pm, and STOP after  your last meal of the day. Drink water,generally about 64 ounces per day, no other drink is as healthy. Fruit juice is best enjoyed in a healthy way, by EATING the fruit.  Thanks for choosing [facility], we consider it a privelige to serve you.

## 2021-10-28 NOTE — Progress Notes (Signed)
Established Patient Office Visit  Subjective:  Patient ID: Scott Ritter, male    DOB: [birth year]  Age: 61 y.o. MRN: [id number]  CC:  Chief Complaint  Patient presents with   Annual Exam    HPI Scott Ritter presents for annual physical examination. He was recently in the hospital for stroke on 10/23/21-10/24/21. Will follow up with  Dr Gerilyn Pilgrim in 3 months. Continue Asprin and plavix for 90 days. Pt denies bleeding.  Pt stated that he will start PT/OT on 1/24th.  No changes in the weakness on left arm and left leg He has been eating sausage and bacon at breakfast.    Past Medical History:  Diagnosis Date   GERD (gastroesophageal reflux disease)    Hypertension    Stroke [facility])    left side deficits    Past Surgical History:  Procedure Laterality Date   BACK SURGERY     BIOPSY  09/17/2018   Procedure: BIOPSY;  Surgeon: West Bali, MD;  Location: [facility];  Service: Endoscopy;;  duodenal biopsy    COLONOSCOPY N/A 02/29/2016   Dr. Darrick Penna: redundant colon, non-bleeding internal hemorrhoids    COLONOSCOPY WITH PROPOFOL N/A 04/09/2021   Procedure: COLONOSCOPY WITH PROPOFOL;  Surgeon: Lanelle Bal, DO;  Location: [facility];  Service: Endoscopy;  Laterality: N/A;  7:30am   ESOPHAGOGASTRODUODENOSCOPY N/A 12/17/2016   Dr. Darrick Penna: 3 mm nonbleeding Mallory-Weiss tear.  EGD performed for hematemesis.  Hemoglobin normal.   ESOPHAGOGASTRODUODENOSCOPY N/A 02/19/2018   Dr. Jena Gauss: Performed for melena, hemoglobin of 6.  Mucosal changes in the esophagus query short segment Barrett's, biopsy more consistent with reflux changes.  Erosive gastropathy but no H. pylori.  Duodenal erosions.  Suspected NSAID induced injury.   ESOPHAGOGASTRODUODENOSCOPY  08/18/2018   Dr. Karilyn Cota: IDA/heme positive stool.  Esophageal mucosal changes consistent with short segment Barrett's esophagus, not biopsied.  2 cm hiatal hernia.  Duodenal bulb, second portion of duodenum, third portion  of the duodenum.  Video capsule somewhat difficult to pass through the oropharynx but was eventually advanced into the second portion of the duodenum and released.   ESOPHAGOGASTRODUODENOSCOPY N/A 08/18/2018   Procedure: ESOPHAGOGASTRODUODENOSCOPY (EGD);  Surgeon: Malissa Hippo, MD;  Location: [facility];  Service: Endoscopy;  Laterality: N/A;   ESOPHAGOGASTRODUODENOSCOPY N/A 09/17/2018   gastritis, 2 nonbleeding angiectasia's in the duodenum were treated with [facility] coagulation status post biopsy.   FOOT SURGERY Left    GIVENS CAPSULE STUDY N/A 08/18/2018   Procedure: GIVENS CAPSULE STUDY;  Surgeon: Malissa Hippo, MD;  Location: [facility];  Service: Endoscopy;  Laterality: N/A;   HERNIA REPAIR     POLYPECTOMY  04/09/2021   Procedure: POLYPECTOMY;  Surgeon: Lanelle Bal, DO;  Location: [facility];  Service: Endoscopy;;  descending; cecal    Family History  Problem Relation Age of Onset   Hypertension Mother    Hypertension Father    Colon cancer Father    Colon polyps Neg Hx     Social History   Socioeconomic History   Marital status: Divorced    Spouse name: Not on file   Number of children: 2   Years of education: Not on file   Highest education level: Not on file  Occupational History   Not on file  Tobacco Use   Smoking status: Never   Smokeless tobacco: Never  Vaping Use   Vaping Use: Never used  Substance and Sexual Activity   Alcohol use: Yes  Alcohol/week: 1.0 standard drink    Types: 1 Cans of beer per week    Comment: occasionally; once a week or once a month   Drug use: No   Sexual activity: Yes    Comment: not married hs sexual partner  Other Topics Concern   Not on file  Social History Narrative   PT lives with his mother, he is on disability for his stroke.    Social Determinants of Health   Financial Resource Strain: Low Risk    Difficulty of Paying Living Expenses: Not hard at all  Food Insecurity: No Food Insecurity   Worried  About Charity fundraiser in the Last Year: Never true   Causey in the Last Year: Never true  Transportation Needs: No Transportation Needs   Lack of Transportation (Medical): No   Lack of Transportation (Non-Medical): No  Physical Activity: Insufficiently Active   Days of Exercise per Week: 3 days   Minutes of Exercise per Session: 30 min  Stress: No Stress Concern Present   Feeling of Stress : Not at all  Social Connections: Socially Integrated   Frequency of Communication with Friends and Family: More than three times a week   Frequency of Social Gatherings with Friends and Family: More than three times a week   Attends Religious Services: More than 4 times per year   Active Member of [employer] or Organizations: Yes   Attends Music therapist: More than 4 times per year   Marital Status: Married  Human resources officer Violence: Not At Risk   Fear of Current or Ex-Partner: No   Emotionally Abused: No   Physically Abused: No   Sexually Abused: No    Outpatient Medications Prior to Visit  Medication Sig Dispense Refill   acetaminophen (TYLENOL) 500 MG tablet Take 500 mg by mouth every 6 (six) hours as needed for moderate pain.     amLODipine (NORVASC) 10 MG tablet TAKE 1 TABLET EVERY DAY (Patient taking differently: Take 10 mg by mouth daily.) 90 tablet 0   aspirin EC 81 MG EC tablet Take 1 tablet (81 mg total) by mouth daily. Swallow whole. 30 tablet 11   atorvastatin (LIPITOR) 40 MG tablet TAKE 1 TABLET (40 MG TOTAL) BY MOUTH EVERY EVENING. 90 tablet 1   Cholecalciferol (VITAMIN D) 2000 units CAPS Take 2,000 Units by mouth daily.     clopidogrel (PLAVIX) 75 MG tablet Take 1 tablet (75 mg total) by mouth daily. 30 tablet 2   hydrALAZINE (APRESOLINE) 100 MG tablet Take 1 tablet (100 mg total) by mouth 3 (three) times daily. 270 tablet 0   losartan-hydrochlorothiazide (HYZAAR) 100-25 MG tablet Take 1 tablet by mouth daily. 90 tablet 3   metoprolol tartrate (LOPRESSOR) 25  MG tablet TAKE 1 TABLET TWICE DAILY (Patient taking differently: Take 25 mg by mouth 2 (two) times daily.) 180 tablet 0   potassium chloride SA (KLOR-CON) 20 MEQ tablet TAKE 1 TABLET TWICE DAILY (Patient taking differently: Take 20 mEq by mouth daily.) 180 tablet 0   No facility-administered medications prior to visit.    No Known Allergies  ROS Review of Systems  Constitutional: Negative.   HENT: Negative.    Eyes: Negative.   Respiratory: Negative.    Cardiovascular: Negative.   Gastrointestinal: Negative.   Endocrine: Negative.   Genitourinary: Negative.   Musculoskeletal:  Positive for gait problem.  Skin: Negative.   Allergic/Immunologic: Negative.   Neurological:  Positive for weakness and numbness.  Hematological: Negative.   Psychiatric/Behavioral: Negative.       Objective:    Physical Exam Constitutional:      General: He is not in acute distress.    Appearance: Normal appearance. He is obese. He is not ill-appearing, toxic-appearing or diaphoretic.  HENT:     Head: Normocephalic and atraumatic.     Right Ear: Tympanic membrane, ear canal and external ear normal. There is no impacted cerumen.     Left Ear: Ear canal and external ear normal. There is impacted cerumen.     Nose: No rhinorrhea.     Mouth/Throat:     Mouth: Mucous membranes are moist.     Pharynx: Oropharynx is clear. No oropharyngeal exudate or posterior oropharyngeal erythema.  Eyes:     General: No scleral icterus.       Right eye: No discharge.        Left eye: No discharge.     Extraocular Movements: Extraocular movements intact.     Conjunctiva/sclera: Conjunctivae normal.  Neck:     Vascular: No carotid bruit.  Cardiovascular:     Rate and Rhythm: Normal rate and regular rhythm.     Pulses: Normal pulses.     Heart sounds: Normal heart sounds. No murmur heard. Pulmonary:     Effort: No respiratory distress.     Breath sounds: No stridor. No wheezing, rhonchi or rales.  Chest:      Chest wall: No tenderness.  Abdominal:     General: There is no distension.     Palpations: Abdomen is soft. There is no mass.     Tenderness: There is no abdominal tenderness. There is no right CVA tenderness, left CVA tenderness, guarding or rebound.  Musculoskeletal:        General: Deformity present. No swelling, tenderness or signs of injury.     Cervical back: Neck supple. No rigidity or tenderness.     Right lower leg: No edema.     Left lower leg: No edema.     Comments: Left arm weakness, 3/5 left leg weakness due to stroke, chronic numbness and tingling of leg leg.   Power intact on right arm and right leg   Lymphadenopathy:     Cervical: No cervical adenopathy.  Skin:    Capillary Refill: Capillary refill takes less than 2 seconds.     Coloration: Skin is not jaundiced or pale.     Findings: No bruising, erythema, lesion or rash.  Neurological:     Mental Status: He is alert.     Cranial Nerves: No cranial nerve deficit.     Sensory: Sensory deficit present.     Motor: Weakness present.     Gait: Gait abnormal.  Psychiatric:        Mood and Affect: Mood normal.        Behavior: Behavior normal.        Thought Content: Thought content normal.        Judgment: Judgment normal.    BP (!) 147/73 (BP Location: Right Arm, Patient Position: Sitting, Cuff Size: Normal)    Pulse 60    Ht $R'5\' 7"'Dp$  (1.702 m)    Wt 196 lb (88.9 kg)    SpO2 97%    BMI 30.70 kg/m  Wt Readings from Last 3 Encounters:  10/28/21 196 lb (88.9 kg)  10/23/21 194 lb 0.1 oz (88 kg)  09/16/21 201 lb (91.2 kg)     Health Maintenance Due  Topic Date Due  TETANUS/TDAP  Never done   Zoster Vaccines- Shingrix (1 of 2) Never done   Pneumococcal Vaccine 9-16 Years old (2 - PPSV23 if available, else PCV20) 08/17/2019    There are no preventive care reminders to display for this patient.  Lab Results  Component Value Date   TSH 0.892 10/21/2021   Lab Results  Component Value Date   WBC 8.6  10/24/2021   HGB 11.5 (L) 10/24/2021   HCT 35.7 (L) 10/24/2021   MCV 88.8 10/24/2021   PLT 320 10/24/2021   Lab Results  Component Value Date   NA 140 10/24/2021   K 3.1 (L) 10/24/2021   CO2 25 10/24/2021   GLUCOSE 91 10/24/2021   BUN 19 10/24/2021   CREATININE 1.07 10/24/2021   BILITOT 0.6 10/23/2021   ALKPHOS 82 10/23/2021   AST 16 10/23/2021   ALT 11 10/23/2021   PROT 7.5 10/23/2021   ALBUMIN 3.9 10/23/2021   CALCIUM 8.8 (L) 10/24/2021   ANIONGAP 7 10/24/2021   EGFR 75 10/21/2021   Lab Results  Component Value Date   CHOL 170 10/21/2021   Lab Results  Component Value Date   HDL 33 (L) 10/21/2021   Lab Results  Component Value Date   LDLCALC 104 (H) 10/21/2021   Lab Results  Component Value Date   TRIG 187 (H) 10/21/2021   Lab Results  Component Value Date   CHOLHDL 5.2 (H) 10/21/2021   Lab Results  Component Value Date   HGBA1C 5.9 (H) 10/21/2021      Assessment & Plan:   Problem List Items Addressed This Visit   None   No orders of the defined types were placed in this encounter.   Follow-up: No follow-ups on file.    Renee Rival, FNP

## 2021-10-28 NOTE — Assessment & Plan Note (Signed)
Importance of healthy food choices with portion control discussed as well as eating regularly within 12  hour window.  ° °The need to choose clean green food 50%-75% of time is discussed as well as make water the primary drink and set a goal for 64 ounces daily. ° °Patient reeducated about the importance of committment to minimum of 150 minutes of exercise per week. ° °Three meals at set times with snacks allowed between meals but they must be fruit or vegetable.  ° °Aim to eat  over 12 hour period  for example 7 am to 7 pm. Stop after your last meal of the day.  °

## 2021-10-29 LAB — BASIC METABOLIC PANEL
BUN/Creatinine Ratio: 14 (ref 9–20)
BUN: 18 mg/dL (ref 6–24)
CO2: 21 mmol/L (ref 20–29)
Calcium: 9.7 mg/dL (ref 8.7–10.2)
Chloride: 99 mmol/L (ref 96–106)
Creatinine, Ser: 1.31 mg/dL — ABNORMAL HIGH (ref 0.76–1.27)
Glucose: 89 mg/dL (ref 70–99)
Potassium: 3.8 mmol/L (ref 3.5–5.2)
Sodium: 139 mmol/L (ref 134–144)
eGFR: 63 mL/min/{1.73_m2} (ref >59)

## 2021-11-05 ENCOUNTER — Ambulatory Visit (HOSPITAL_COMMUNITY): Payer: Medicare HMO | Attending: Internal Medicine | Admitting: Physical Therapy

## 2021-11-12 ENCOUNTER — Ambulatory Visit: Payer: Medicare HMO | Admitting: Neurology

## 2021-11-22 ENCOUNTER — Ambulatory Visit (HOSPITAL_COMMUNITY): Payer: Medicare HMO | Attending: Internal Medicine | Admitting: Physical Therapy

## 2021-12-10 ENCOUNTER — Other Ambulatory Visit: Payer: Self-pay

## 2021-12-10 ENCOUNTER — Encounter: Payer: Self-pay | Admitting: Nurse Practitioner

## 2021-12-10 ENCOUNTER — Ambulatory Visit (INDEPENDENT_AMBULATORY_CARE_PROVIDER_SITE_OTHER): Payer: Medicare HMO | Admitting: Nurse Practitioner

## 2021-12-10 VITALS — BP 140/74 | HR 56 | Ht 67.0 in | Wt 194.0 lb

## 2021-12-10 DIAGNOSIS — I10 Essential (primary) hypertension: Secondary | ICD-10-CM | POA: Diagnosis not present

## 2021-12-10 DIAGNOSIS — E876 Hypokalemia: Secondary | ICD-10-CM | POA: Diagnosis not present

## 2021-12-10 DIAGNOSIS — E782 Mixed hyperlipidemia: Secondary | ICD-10-CM | POA: Diagnosis not present

## 2021-12-10 MED ORDER — AMLODIPINE BESYLATE 10 MG PO TABS: 10.0000 mg | ORAL_TABLET | Freq: Every day | ORAL | 0 refills | Status: DC

## 2021-12-10 MED ORDER — METOPROLOL TARTRATE 25 MG PO TABS: 25.0000 mg | ORAL_TABLET | Freq: Two times a day (BID) | ORAL | 0 refills | Status: DC

## 2021-12-10 NOTE — Patient Instructions (Signed)

## 2021-12-10 NOTE — Assessment & Plan Note (Addendum)
BP Readings from Last 3 Encounters:  12/10/21 140/74  10/28/21 128/82  10/24/21 (!) 158/77  Takes amlodipine 10 mg, hydralazine 100 mg 3 times daily, losartan-hydrochlorothiazide 100-25 mg tablet 1 tablet daily, metoprolol 25 mg twice daily. Patient stated that he is taking all medications today. meds refilled today  DashDiet advised, need to exercise 30 minutes daily 5 days a week as tolerated discussed with patient verbalized understanding.  Follow-up in 4 months

## 2021-12-10 NOTE — Assessment & Plan Note (Signed)
Lab Results  Component Value Date   CHOL 170 10/21/2021   HDL 33 (L) 10/21/2021   LDLCALC 104 (H) 10/21/2021   TRIG 187 (H) 10/21/2021   CHOLHDL 5.2 (H) 10/21/2021  Takes atorvastatin 80 mg daily, he was previously taking atorvastatin 40 mg until last visit.  Recheck lipid panel today.  Will add ezetimibe 10mg  daily  med if lipid panel not at goal after labs today. Check lipid panel and liver function today

## 2021-12-10 NOTE — Progress Notes (Signed)
° °  Scott Ritter     MRN: [id number]      DOB: [birth year]   HPI Scott Ritter with medical history of hypertension, CVA, hypokalemia, hyperlipidemia, obesity, is here for follow up  for BP and HLD. He took amlodipine 10mg , hydralazine 100mg  and losartan-hydrochlorothiazide 100-25mg  this morning. He took all med as about an hour ago.  Patient stated that he has been taking atorvastatin 80 mg daily.   Pt denies any specific complaints   Pt stated that he got TDAP [facility] .  Need refill for amlodipine      ROS Denies recent fever or chills. Denies sinus pressure, nasal congestion, ear pain or sore throat. Denies chest congestion, productive cough or wheezing. Denies chest pains, palpitations and leg swelling Denies abdominal pain, nausea, vomiting,diarrhea or constipation.   Denies dysuria, frequency, hesitancy or incontinence. Denies depression, anxiety or insomnia.    PE  BP 140/86 (BP Location: Right Arm, Cuff Size: Normal)    Pulse (!) 56    Ht 5\' 7"  (1.702 m)    Wt 194 lb (88 kg)    SpO2 97%    BMI 30.38 kg/m   Patient alert and oriented and in no cardiopulmonary distress.   Chest: Clear to auscultation bilaterally.  CVS: S1, S2 no murmurs, no S3.Regular rate.  ABD: Soft non tender.   Ext: No edema  MS: Adequate ROM spine, shoulders, hips and knees. Left arm weakness due to past stroke   Psych: Good eye contact, normal affect. Memory intact not anxious or depressed appearing.  CNS: CN 2-12 intact, power,  normal throughout.no focal deficits noted.   Assessment & Plan HTN (hypertension) BP Readings from Last 3 Encounters:  12/10/21 140/74  10/28/21 128/82  10/24/21 (!) 158/77  Takes amlodipine 10 mg, hydralazine 100 mg 3 times daily, losartan-hydrochlorothiazide 100-25 mg tablet 1 tablet daily, metoprolol 25 mg twice daily. Patient stated that he is taking all medications today. meds refilled today  DashDiet  advised, need to exercise 30 minutes daily 5 days a week as tolerated discussed with patient verbalized understanding.  Follow-up in 4 months  Hyperlipidemia Lab Results  Component Value Date   CHOL 170 10/21/2021   HDL 33 (L) 10/21/2021   LDLCALC 104 (H) 10/21/2021   TRIG 187 (H) 10/21/2021   CHOLHDL 5.2 (H) 10/21/2021  Takes atorvastatin 80 mg daily, he was previously taking atorvastatin 40 mg until last visit.  Recheck lipid panel today.  Will add ezetimibe 10mg  daily  med if lipid panel not at goal after labs today. Check lipid panel and liver function today

## 2021-12-11 ENCOUNTER — Other Ambulatory Visit: Payer: Self-pay | Admitting: Nurse Practitioner

## 2021-12-11 DIAGNOSIS — E782 Mixed hyperlipidemia: Secondary | ICD-10-CM

## 2021-12-11 DIAGNOSIS — I639 Cerebral infarction, unspecified: Secondary | ICD-10-CM

## 2021-12-11 LAB — LIPID PANEL
Chol/HDL Ratio: 3.9 ratio (ref 0.0–5.0)
Cholesterol, Total: 145 mg/dL (ref 100–199)
HDL: 37 mg/dL — ABNORMAL LOW (ref >39)
LDL Chol Calc (NIH): 84 mg/dL (ref 0–99)
Triglycerides: 133 mg/dL (ref 0–149)
VLDL Cholesterol Cal: 24 mg/dL (ref 5–40)

## 2021-12-11 LAB — HEPATIC FUNCTION PANEL
ALT: 10 IU/L (ref 0–44)
AST: 18 IU/L (ref 0–40)
Albumin: 4.7 g/dL (ref 3.8–4.9)
Alkaline Phosphatase: 105 IU/L (ref 44–121)
Bilirubin Total: 0.4 mg/dL (ref 0.0–1.2)
Bilirubin, Direct: 0.11 mg/dL (ref 0.00–0.40)
Total Protein: 7.6 g/dL (ref 6.0–8.5)

## 2021-12-11 MED ORDER — EZETIMIBE 10 MG PO TABS: 10.0000 mg | ORAL_TABLET | Freq: Every day | ORAL | 3 refills | Status: DC

## 2021-12-11 NOTE — Progress Notes (Signed)
Please review results with patient  LDL as improved, but it is not yet at goal, LDL goal is less than 70.  Patient should start taking Zetia 10 mg daily, avoid fatty fried foods cheese butter.  To help reduce the risk of him getting more stroke events.  I have ordered fasting lipid panel to be drawn 3 to 5 days before next appointment  Please schedule a 7-week follow-up with patient to recheck his cholesterol levels Liver functions is stable.

## 2021-12-16 ENCOUNTER — Ambulatory Visit (HOSPITAL_COMMUNITY): Payer: Medicare HMO | Admitting: Physical Therapy

## 2021-12-20 ENCOUNTER — Encounter (HOSPITAL_COMMUNITY): Payer: Self-pay

## 2021-12-20 ENCOUNTER — Other Ambulatory Visit: Payer: Self-pay

## 2021-12-20 ENCOUNTER — Ambulatory Visit (HOSPITAL_COMMUNITY): Payer: Medicare HMO | Attending: Internal Medicine

## 2021-12-20 DIAGNOSIS — R262 Difficulty in walking, not elsewhere classified: Secondary | ICD-10-CM | POA: Diagnosis not present

## 2021-12-20 DIAGNOSIS — I639 Cerebral infarction, unspecified: Secondary | ICD-10-CM | POA: Diagnosis not present

## 2021-12-20 DIAGNOSIS — M6281 Muscle weakness (generalized): Secondary | ICD-10-CM | POA: Diagnosis not present

## 2021-12-20 DIAGNOSIS — M25572 Pain in left ankle and joints of left foot: Secondary | ICD-10-CM | POA: Diagnosis not present

## 2021-12-20 NOTE — Therapy (Signed)
OUTPATIENT PHYSICAL THERAPY NEURO EVALUATION   Patient Name: Scott Ritter MRN: [id number] DOB:07/06/1961, 61 y.o., male Today's Date: 12/23/2021  PCP: Renee Rival, FNP REFERRING PROVIDER: Rodena Goldmann, DO   PT End of Session - 12/23/21 0913     Visit Number 1    Number of Visits 8    Date for PT Re-Evaluation 01/23/22    Authorization Type [employer] MCR no visit limit, auth req via cohere, requesting 8 visits    PT Start Time 0815    PT Stop Time 0900    PT Time Calculation (min) 45 min    Activity Tolerance Patient tolerated treatment well    Behavior During Therapy WFL for tasks assessed/performed             Past Medical History:  Diagnosis Date   GERD (gastroesophageal reflux disease)    Hypertension    Stroke [facility])    left side deficits   Past Surgical History:  Procedure Laterality Date   BACK SURGERY     BIOPSY  09/17/2018   Procedure: BIOPSY;  Surgeon: Danie Binder, MD;  [facility];  Service: Endoscopy;;  duodenal biopsy    COLONOSCOPY N/A 02/29/2016   Dr. Oneida Alar: redundant colon, non-bleeding internal hemorrhoids    COLONOSCOPY WITH PROPOFOL N/A 04/09/2021   Procedure: COLONOSCOPY WITH PROPOFOL;  Surgeon: Eloise Harman, DO;  [facility];  Service: Endoscopy;  Laterality: N/A;  7:30am   ESOPHAGOGASTRODUODENOSCOPY N/A 12/17/2016   Dr. Oneida Alar: 3 mm nonbleeding Mallory-Weiss tear.  EGD performed for hematemesis.  Hemoglobin normal.   ESOPHAGOGASTRODUODENOSCOPY N/A 02/19/2018   Dr. Gala Romney: Performed for melena, hemoglobin of 6.  Mucosal changes in the esophagus query short segment Barrett's, biopsy more consistent with reflux changes.  Erosive gastropathy but no H. pylori.  Duodenal erosions.  Suspected NSAID induced injury.   ESOPHAGOGASTRODUODENOSCOPY  08/18/2018   Dr. Laural Golden: IDA/heme positive stool.  Esophageal mucosal changes consistent with short segment Barrett's esophagus, not biopsied.  2 cm hiatal hernia.   Duodenal bulb, second portion of duodenum, third portion of the duodenum.  Video capsule somewhat difficult to pass through the oropharynx but was eventually advanced into the second portion of the duodenum and released.   ESOPHAGOGASTRODUODENOSCOPY N/A 08/18/2018   Procedure: ESOPHAGOGASTRODUODENOSCOPY (EGD);  Surgeon: Rogene Houston, MD;  [facility];  Service: Endoscopy;  Laterality: N/A;   ESOPHAGOGASTRODUODENOSCOPY N/A 09/17/2018   gastritis, 2 nonbleeding angiectasia's in the duodenum were treated with [facility] coagulation status post biopsy.   FOOT SURGERY Left    GIVENS CAPSULE STUDY N/A 08/18/2018   Procedure: GIVENS CAPSULE STUDY;  Surgeon: Rogene Houston, MD;  [facility];  Service: Endoscopy;  Laterality: N/A;   HERNIA REPAIR     POLYPECTOMY  04/09/2021   Procedure: POLYPECTOMY;  Surgeon: Eloise Harman, DO;  [facility];  Service: Endoscopy;;  descending; cecal   Patient Active Problem List   Diagnosis Date Noted   Prediabetes 10/28/2021   CVA (cerebral vascular accident) ([place]) 10/23/2021   Obesity (BMI 30-39.9) 09/16/2021   Left foot pain 09/16/2021   Need for immunization against influenza 09/16/2021   Family history of colon cancer in father 02/19/2021   Knee pain 11/15/2019   Medicare annual wellness visit, subsequent 10/20/2019   Nocturia 10/20/2019   Malignant HTN with heart disease, w/o CHF, with chronic kidney disease 01/24/2019   Chest pain in adult 01/24/2019   Acute renal failure superimposed on stage  2 chronic kidney disease ([place]) 01/24/2019   PSVT (paroxysmal supraventricular tachycardia) ([facility]) 01/24/2019   Elevated troponin 01/24/2019   Iron deficiency anemia    Symptomatic anemia 02/18/2018   Melena 02/18/2018   History of Stroke 02/18/2018   Leukocytosis 02/18/2018   NSAID induced gastritis 02/18/2018   Hyperlipidemia 02/18/2018   Gastrointestinal hemorrhage with melena    History of Mallory-Weiss tear 12/16/2016    Hematemesis 12/16/2016   HTN (hypertension) 12/16/2016   Hypokalemia 12/16/2016   Acute upper GI bleed    Special screening for malignant neoplasms, colon     ONSET DATE: He was recently in the hospital for stroke on 10/23/21-10/24/21.   REFERRING DIAG: I63.9 (ICD-10-CM) - Cerebrovascular accident (CVA), unspecified mechanism ([place])   THERAPY DIAG:  Difficulty in walking, not elsewhere classified  Pain in left ankle and joints of left foot  Muscle weakness (generalized).                                                                                                                                                                                 SUBJECTIVE STATEMENT: Had a new recent stroke in January that was mild, was having some dizziness and inability to stand up. Stroke more located in "back region" cerebellum. Had a large prior stroke about 16 years ago March that "changed my life". Currently, from recent stroke not much change but would like to work on his strength of left leg and arm is able.  Pt accompanied by: self  PERTINENT HISTORY: HTN, CVA prior 16 years ago affecting L side; also hypokalemia, hyperlipidemia, obesity  PAIN:  Are you having pain? Yes NPRS scale: 10/10 Pain location: left foot Pain orientation: Left  PAIN TYPE: sharp and "screw driver into bottom of foot" Pain description: constant and stabbing  Aggravating factors: prolonged sitting, cold weather Relieving factors: movement, soaking and ice, tylenol as needed  PRECAUTIONS: Fall  WEIGHT BEARING RESTRICTIONS No  FALLS: Has patient fallen in last 6 months? No  LIVING ENVIRONMENT: Lives with: lives with their family Lives in: House/apartment Stairs: Yes; External: 3 steps; on right going up and can reach both Has following equipment at home: Single point cane and foot brace (not currently using with foot pain)  PLOF: Independent with household mobility with device SPC as needed  [facility], and water aerobics but on hold while he is working on left foot nerve pain, going to new MD next week  PATIENT GOALS "want to be able to walk better, get stronger with leg, and stretch and move around arm better, loosen up left shoulder"  OBJECTIVE:   COGNITION: Overall cognitive status: Within functional limits for tasks assessed  Areas of impairment: none Commands: Follows multi-step commands consistently Attention: Appears intact Memory: Appears intact Awareness: Appears intact Problem solving: Appears intact Executive function: WNL Behavior: Within functional limits   SENSATION: Light touch: Appears intact Stereognosis: Appears intact Hot/Cold: Appears intact Proprioception: Deficits poor for L LE  COORDINATION: Fair with observation of gait and transfer   EDEMA:  None present  MUSCLE TONE: RLE: Severe and Hypertonic   MUSCLE LENGTH: Hamstrings: Right 40 deg; Left 25 deg  DTRs:  Patella 2+ = Normal  POSTURE: rounded shoulders, forward head, increased thoracic kyphosis, flexed trunk , and weight shift right  AROM/PROM:    A/PROM Right 12/23/2021 Left 12/23/2021  Hip flexion    Hip extension    Hip abduction    Hip adduction    Hip internal rotation    Hip external rotation    Knee flexion    Knee extension    Ankle dorsiflexion    Ankle plantarflexion    Ankle inversion    Ankle eversion     (Blank rows = not tested) MMT:  MMT Right 12/23/2021 Left 12/23/2021  Hip flexion 5 4  Hip extension 5 4-  Hip abduction 5 4  Hip adduction 5 4+  Hip internal rotation 5 4  Hip external rotation 5 4  Knee flexion 5 4-  Knee extension 5 4  Ankle dorsiflexion 5 3+  Ankle plantarflexion 5 3+  Ankle inversion 5 3+  Ankle eversion 5 3+  (Blank rows = not tested)  BED MOBILITY:  Independent, able to move all directions  TRANSFERS: Assistive device utilized: Single point cane  Sit to stand: Modified independence Stand to sit: Modified  independence Chair to chair: Modified independence Floor:  not observed  RAMP:  Level of Assistance: Modified independence Assistive device utilized: Single point cane Ramp Comments: patient reports this is his best access  CURB:  Level of Assistance: Modified independence Assistive device utilized: Single point cane Curb Comments:   STAIRS:  Level of Assistance: Modified Artist Technique: Step to Pattern With use of AD: SPC  with Single Rail on Right  Number of Stairs: 3   Height of Stairs: 8inch  Comments: dominates with R side strength  GAIT: Gait pattern: step through pattern, decreased arm swing- Left, decreased step length- Left, decreased stance time- Left, decreased hip/knee flexion- Left, decreased ankle dorsiflexion- Left, circumduction- Left, lateral lean- Right, decreased trunk rotation, and poor foot clearance- Left Distance walked: 250 feet in 2MWT Assistive device utilized: Single point cane Level of assistance: Modified independence Comments: holds steady pace throughout  FUNCTIONAL TESTs:  2 minute walk test: 250 feet (one lap around clinic) Dynamic Gait Index: not formally tested today secondary to time  PATIENT SURVEYS:  FOTO to be done at next session   TODAY'S TREATMENT:  12/20/21:     Physical therapy eval, discussion and education as below, and HEP given and reviewed Exercises Seated Hamstring Stretch - 3 x daily - 7 x weekly - 1 sets - 2 reps - 30 seconds hold Seated Heel Toe Raises - 3 x daily - 7 x weekly - 2 sets - 20 reps   PATIENT EDUCATION: Education details: Physical therapy eval findings, POC to focus on building HEP with more L LE focus for possible nerve pain Person educated: Patient Education method: Explanation, Demonstration, Verbal cues, and Handouts Education comprehension: verbalized understanding and returned demonstration   HOME EXERCISE PROGRAM: Access Code: [place] URL:  [email or web address] Date: 12/20/2021 Prepared by: Mardene Celeste  Maile Linford     ASSESSMENT:  CLINICAL IMPRESSION: Patient is a 61 y.o. male who was seen today for physical therapy evaluation and treatment after a recent CVA which caused difficulty walking and left lower extremity, especially foot pain and below impairments.    OBJECTIVE IMPAIRMENTS Abnormal gait, decreased activity tolerance, decreased balance, decreased coordination, decreased endurance, difficulty walking, decreased ROM, decreased strength, increased fascial restrictions, impaired flexibility, impaired UE functional use, improper body mechanics, postural dysfunction, and pain.   ACTIVITY LIMITATIONS cleaning, community activity, meal prep, and yard work.   PERSONAL FACTORS Time since onset of injury/illness/exacerbation and 3+ comorbidities: hypertension, prior CVA, hypokalemia, hyperlipidemia, obesity  are also affecting patient's functional outcome.   REHAB POTENTIAL: Fair with focus on L LE symptoms having best possible improvement for gait and safety  CLINICAL DECISION MAKING: Stable/uncomplicated  EVALUATION COMPLEXITY: Low   GOALS: Goals reviewed with patient? Yes  SHORT TERM GOALS:  Patient will be independent in HEP for self management of long term CVA needs to progress function.  Baseline: new HEP established today and building Target date: 01/20/2022 Goal status: INITIAL  2.  Patient will be able to demonstrate improved left hamstring length to a straight leg raise of at least 45 degrees to improve posterior chain flexibility to aid in improved gait pattern.  Baseline: 12/20/21 Left straight leg raise to 25 degrees  Target date: 01/20/2022 Goal status: INITIAL  3.  Patient will report his left lower extremity foot pain will be at least equal to or less than 8/10 for improved ability to weight bear and decrease fall risk.  Baseline: 12/20/21 patient reports 10/10 pain Target date:  01/20/2022 Goal status: INITIAL   LONG TERM GOALS:  Patient will report his left lower extremity foot pain will be at least equal to or less than 6/10 for improved ability to weight bear and decrease fall risk.  Baseline: 12/20/21 patient reports 10/10 pain Target date: 02/17/2022 Goal status: INITIAL  2.  Patient will be able to ambulate for at least 350 feet in 2 minute walk test to demonstrate improved gait function with no loss of balance and improved step through gait patterning with equal bilateral weight bearing.   Baseline: 12/20/21 250 feet on 2MWT and decreased left LE WB Target date: 02/17/2022 Goal status: INITIAL  3.  Patient will be able to increase gross L LE strength to all at least 4/10 for improved weight bearing abilities in upright posturing Baseline: 12/20/21 L ankle gross 3+/5, see above objective measure for more Target date: 02/17/2022 Goal status: INITIAL   PLAN: PT FREQUENCY: 1x/week  PT DURATION: 8 weeks  PLANNED INTERVENTIONS: Therapeutic exercises, Therapeutic activity, Neuromuscular re-education, Balance training, Gait training, Patient/Family education, Joint mobilization, Stair training, Spinal mobilization, and Taping  PLAN FOR NEXT SESSION: Review HEP, build and educate on nerve glides/flossing and posterior chain opening, build HEP with posterior chain flexibility and anterior chain strengthening; gait and stair training as needed    9:43 AM, 12/23/21  Margarette Asal. Carlis Abbott PT, DPT  Contract Physical Therapist at  [facility], PT 12/23/2021, 9:22 AM

## 2021-12-25 ENCOUNTER — Ambulatory Visit: Payer: Medicare HMO | Admitting: Neurology

## 2021-12-25 ENCOUNTER — Encounter: Payer: Self-pay | Admitting: Neurology

## 2021-12-25 VITALS — BP 166/82 | HR 63 | Ht 67.0 in | Wt 194.0 lb

## 2021-12-25 DIAGNOSIS — M79672 Pain in left foot: Secondary | ICD-10-CM | POA: Diagnosis not present

## 2021-12-25 DIAGNOSIS — I629 Nontraumatic intracranial hemorrhage, unspecified: Secondary | ICD-10-CM

## 2021-12-25 DIAGNOSIS — G8114 Spastic hemiplegia affecting left nondominant side: Secondary | ICD-10-CM | POA: Diagnosis not present

## 2021-12-25 NOTE — Progress Notes (Signed)
? ?Chief Complaint  ?Patient presents with  ? New Patient (Initial Visit)  ?  Rm 14. Alone. PCP is Donell Beers, FNP. ?NP internal referral for Numbness of left foot. ?Pt c/o sharp pain in left foot radiating to knee cap. C/o tenderness on the bottom of foot.  ? ? ? ? ?ASSESSMENT AND PLAN ? ?Scott Ritter is a 61 y.o. male   ?History of right basal ganglion, cortex remote intracranial hemorrhage ?Residual spastic left hemiparesis, ?Significant left foot pain, ?Episode of sudden onset increased weakness, dizziness in January 2023 ? Repeat MRI showed no new acute abnormality, there is evidence of hemosiderin stained encephalomalacia in the right basal ganglion, coronary radiata, insula and operculum related to remote hemorrhage ? CT angiogram of head and neck showed advanced intracranial atherosclerotic disease, no significant large vessel occlusion ? He was started on aspirin plus Plavix treatment, was not on any antiplatelet treatment in the past, I have advised him to stop the Plavix, keep aspirin 81 mg alone ? Differentiation diagnosis of his sudden onset generalized weakness, dizziness, also include partial seizure, ? EEG ? MRI of left foot to evaluate left foot pathology ? Preauthorization for Xeomin 600 units for spastic left hemiparesis ? ? ? ?DIAGNOSTIC DATA (LABS, IMAGING, TESTING) ?- I reviewed patient records, labs, notes, testing and imaging myself where available. ? ? ?Scott HISTORY: ? ?Scott Ritter is a 61 year old male, seen in request by nurse practitioner Jiles Prows for evaluation of numbness of left foot, left foot pain, initial evaluation was on December 25, 2021, his primary care is nurse practitioner Donell Beers, FNP  ? ?I reviewed and summarized the referring note. PMHx ?HTN ?HLD ?Stroke in 2007, ?Low back surgery in 2007 ? ?Patient have a history of stroke, I personally reviewed MRI of brain without contrast on October 23, 2021, showed encephalomalacia from right  cerebral hemorrhage that he suffered in 2007, due to hypertension, he had residual symptoms of mild left hemiparesis, he was not on any antiplatelet treatment prior to hospital admission in January 2023 ? ?CT angiogram of head and neck on October 23, 2021 showed no significant large vessel disease, evidence of intracranial atherosclerotic disease. ? ?Echocardiogram showed ejection fraction of 60 to 65%,mild left ventricular hypertrophy ? ?He presented to hospital on October 23, 2021 for dizziness, worsening left-sided weakness, increased difficulty walking, lasting for few hours, he had multiple similar episode of variable degree over the past 2 years, he denies a history of seizure, no loss of consciousness, no left-sided jerking movement ? ? ?PHYSICAL EXAM: ?  ?Vitals:  ? 12/25/21 0810  ?BP: (!) 166/82  ?Pulse: 63  ?Weight: 194 lb (88 kg)  ?Height: 5\' 7"  (1.702 m)  ? ?Not recorded ?  ? ? ?Body mass index is 30.38 kg/m?. ? ?PHYSICAL EXAMNIATION: ? ?Gen: NAD, conversant, well nourised, well groomed                     ?Cardiovascular: Regular rate rhythm, no peripheral edema, warm, nontender. ?Eyes: Conjunctivae clear without exudates or hemorrhage ?Neck: Supple, no carotid bruits. ?Pulmonary: Clear to auscultation bilaterally  ? ?NEUROLOGICAL EXAM: ? ?MENTAL STATUS: ?Speech/cognition: ?Awake, alert, oriented to history taking and casual conversation mildly dysarthria, no aphasia ? ?CRANIAL NERVES: ?CN II: Visual fields are full to confrontation. Pupils are round equal and briskly reactive to light. ?CN III, IV, VI: extraocular movement are normal. No ptosis. ?CN V: Facial sensation is intact to light touch ?CN VII: Face  is symmetric with normal eye closure  ?CN VIII: Hearing is normal to causal conversation. ?CN IX, X: Phonation is normal. ?CN XI: Head turning and shoulder shrug are intact ?CN XII: Thick tongue, narrow oropharyngeal space ? ?MOTOR: Spastic left hemiparesis, left arm able to raise against gravity,  tendency for left shoulder adduction, pronation, thumb in position, with passive stretch, he was able to gain significant range of motion, but complains of pain, ? ?Antigravity movement of left hip flexion, left knee flexion 4, left knee extension 5, left ankle dorsiflexion 2, plantarflexion 2, significant tenderness of left foot, especially at the left first and second metatarsal joints. ? ?REFLEXES: Hyperreflexia on the left upper and lower extremity, left side Babinski sign ? ?SENSORY: ?Intact to light touch, pinprick and vibratory sensation are intact in fingers and toes. ? ?COORDINATION: ?There is no trunk or limb dysmetria noted. ? ?GAIT/STANCE: He needs push-up to get up from seated position, left hemi-circumferential gait, dragging left foot across the floor, left first toe mild extension, lateral 4 toes forcefully flexion, ? ?REVIEW OF SYSTEMS:  ?Full 14 system review of systems performed and notable only for as above ?All other review of systems were negative. ? ? ?ALLERGIES: ?No Known Allergies ? ?HOME MEDICATIONS: ?Current Outpatient Medications  ?Medication Sig Dispense Refill  ? acetaminophen (TYLENOL) 500 MG tablet Take 500 mg by mouth every 6 (six) hours as needed for moderate pain.    ? amLODipine (NORVASC) 10 MG tablet Take 1 tablet (10 mg total) by mouth daily. 90 tablet 0  ? aspirin EC 81 MG EC tablet Take 1 tablet (81 mg total) by mouth daily. Swallow whole. 30 tablet 11  ? atorvastatin (LIPITOR) 80 MG tablet Take 1 tablet (80 mg total) by mouth daily. 90 tablet 3  ? Cholecalciferol (VITAMIN D) 2000 units CAPS Take 2,000 Units by mouth daily.    ? clopidogrel (PLAVIX) 75 MG tablet Take 1 tablet (75 mg total) by mouth daily. 30 tablet 2  ? ezetimibe (ZETIA) 10 MG tablet Take 1 tablet (10 mg total) by mouth daily. 90 tablet 3  ? hydrALAZINE (APRESOLINE) 100 MG tablet Take 1 tablet (100 mg total) by mouth 3 (three) times daily. 270 tablet 0  ? losartan-hydrochlorothiazide (HYZAAR) 100-25 MG tablet  Take 1 tablet by mouth daily. 90 tablet 3  ? metoprolol tartrate (LOPRESSOR) 25 MG tablet Take 1 tablet (25 mg total) by mouth 2 (two) times daily. 180 tablet 0  ? potassium chloride SA (KLOR-CON) 20 MEQ tablet TAKE 1 TABLET TWICE DAILY (Patient taking differently: Take 20 mEq by mouth daily.) 180 tablet 0  ? ?No current facility-administered medications for this visit.  ? ? ?PAST Scott HISTORY: ?Past Scott History:  ?Diagnosis Date  ? GERD (gastroesophageal reflux disease)   ? Hypertension   ? Stroke [facility])   ? left side deficits  ? ? ?PAST SURGICAL HISTORY: ?Past Surgical History:  ?Procedure Laterality Date  ? BACK SURGERY    ? BIOPSY  09/17/2018  ? Procedure: BIOPSY;  Surgeon: West Bali, MD;  [facility];  Service: Endoscopy;;  duodenal biopsy ?  ? COLONOSCOPY N/A 02/29/2016  ? Dr. Darrick Penna: redundant colon, non-bleeding internal hemorrhoids   ? COLONOSCOPY WITH PROPOFOL N/A 04/09/2021  ? Procedure: COLONOSCOPY WITH PROPOFOL;  Surgeon: Lanelle Bal, DO;  [facility];  Service: Endoscopy;  Laterality: N/A;  7:30am  ? ESOPHAGOGASTRODUODENOSCOPY N/A 12/17/2016  ? Dr. Darrick Penna: 3 mm nonbleeding Mallory-Weiss tear.  EGD performed for  hematemesis.  Hemoglobin normal.  ? ESOPHAGOGASTRODUODENOSCOPY N/A 02/19/2018  ? Dr. Jena Gaussourk: Performed for melena, hemoglobin of 6.  Mucosal changes in the esophagus query short segment Barrett's, biopsy more consistent with reflux changes.  Erosive gastropathy but no H. pylori.  Duodenal erosions.  Suspected NSAID induced injury.  ? ESOPHAGOGASTRODUODENOSCOPY  08/18/2018  ? Dr. Karilyn Cotaehman: IDA/heme positive stool.  Esophageal mucosal changes consistent with short segment Barrett's esophagus, not biopsied.  2 cm hiatal hernia.  Duodenal bulb, second portion of duodenum, third portion of the duodenum.  Video capsule somewhat difficult to pass through the oropharynx but was eventually advanced into the second portion of the duodenum and released.  ?  ESOPHAGOGASTRODUODENOSCOPY N/A 08/18/2018  ? Procedure: ESOPHAGOGASTRODUODENOSCOPY (EGD);  Surgeon: Malissa Hippoehman, Najeeb U, MD;  [facility];  Service: Endoscopy;  Laterality: N/A;  ? ESOPHAGOGASTRODUODENOSCOPY

## 2021-12-26 ENCOUNTER — Telehealth: Payer: Self-pay | Admitting: Neurology

## 2021-12-26 ENCOUNTER — Telehealth (HOSPITAL_COMMUNITY): Payer: Self-pay | Admitting: Physical Therapy

## 2021-12-26 NOTE — Telephone Encounter (Signed)
Received signed patient consent (given to medical records) for Xeomin (Dx: G86.761). I completed [employer] PA form, placed in Nurse Pod for MD signature. ? ? ?

## 2021-12-26 NOTE — Telephone Encounter (Signed)
Pt called stating MD is working on his foot with shots and they decided to take a break for PT until April 7th. ?

## 2021-12-26 NOTE — Telephone Encounter (Signed)
Faxed signed PA form with OV notes to [employer].  

## 2021-12-27 ENCOUNTER — Ambulatory Visit (HOSPITAL_COMMUNITY): Payer: Medicare HMO | Admitting: Physical Therapy

## 2021-12-30 ENCOUNTER — Telehealth: Payer: Self-pay | Admitting: Neurology

## 2021-12-30 ENCOUNTER — Ambulatory Visit: Payer: Medicare HMO | Admitting: Neurology

## 2021-12-30 NOTE — Telephone Encounter (Signed)
Received approval from [place], [place] # [id number] (12/26/2021-10/12/2022). ?

## 2021-12-30 NOTE — Telephone Encounter (Signed)
[employer] pending uploaded notes on the portal  ?

## 2022-01-01 ENCOUNTER — Other Ambulatory Visit: Payer: Medicare HMO | Admitting: *Deleted

## 2022-01-01 ENCOUNTER — Ambulatory Visit: Payer: Medicare HMO | Admitting: Neurology

## 2022-01-01 DIAGNOSIS — G8114 Spastic hemiplegia affecting left nondominant side: Secondary | ICD-10-CM

## 2022-01-01 DIAGNOSIS — I629 Nontraumatic intracranial hemorrhage, unspecified: Secondary | ICD-10-CM

## 2022-01-01 NOTE — Telephone Encounter (Signed)
They reach out to the patient to schedule the MRI but the patient informed them that he has already had this exam done.  ?

## 2022-01-01 NOTE — Telephone Encounter (Signed)
He complains of significant left foot pain, I was planning on xeomin injection for spastic left hemiparesis, including left foot injection ? ?I ordered MRI of left foot, he did have MRI of the brain done in January 2023, we have record of that. ? ? ?If he no longer has left foot pain, it is okay not to proceed with that ? ?

## 2022-01-01 NOTE — Telephone Encounter (Signed)
Scott Ritter: [id number] (exp. 12/30/21 to 01/29/22) order sent to [facility] to be scheduled at [facility]. They will reach out to the patient to schedule.  ?

## 2022-01-02 NOTE — Telephone Encounter (Signed)
I spoke to the patient. He has continued to have significant pain in his left foot. He would like to proceed with the ordered MRI. ?

## 2022-01-02 NOTE — Telephone Encounter (Signed)
Noted, sent the order back to [employer] to be scheduled at [facility]. They will reach out to the patient to schedule.  ?

## 2022-01-03 ENCOUNTER — Encounter (HOSPITAL_COMMUNITY): Payer: Medicare HMO

## 2022-01-10 ENCOUNTER — Encounter (HOSPITAL_COMMUNITY): Payer: Medicare HMO | Admitting: Physical Therapy

## 2022-01-17 ENCOUNTER — Ambulatory Visit (HOSPITAL_COMMUNITY): Payer: Medicare HMO | Attending: Internal Medicine | Admitting: Physical Therapy

## 2022-01-19 NOTE — Procedures (Signed)
? ?  HISTORY: 61 year old male with history of right cortex remote intracranial hemorrhage, spastic left hemiparesis, presenting with episodes of sudden onset increased weakness, dizziness ? ?TECHNIQUE:  ?This is a routine 16 channel EEG recording with one channel devoted to a limited EKG recording.  It was performed during wakefulness, drowsiness and asleep.  Hyperventilation and photic stimulation were performed as activating procedures.  There are minimum muscle and movement artifact noted. ? ?Upon maximum arousal, posterior dominant waking rhythm consistent of rhythmic alpha range activity, with frequency of my hz. Activities are symmetric over the bilateral posterior derivations and attenuated with eye opening. ? ?Hyperventilation produced mild/moderate buildup with higher amplitude and the slower activities noted. ? ?Photic stimulation did not alter the tracing. ? ?During EEG recording, patient developed drowsiness and no deeper stage of sleep was achieved ? ?During EEG recording, there was no epileptiform discharge noted. ? ?EKG demonstrate sinus rhythm, with heart rate of 48 bpm ? ?CONCLUSION: ?This is a  normal awake EEG.  There is no electrodiagnostic evidence of epileptiform discharge. ? ?Marcial Pacas, M.D. Ph.D. ? ?[facility] ?[place], [place] [address] ?Phone: [phone] ?Fax:      [phone]  ?

## 2022-01-22 ENCOUNTER — Telehealth: Payer: Self-pay | Admitting: *Deleted

## 2022-01-22 ENCOUNTER — Ambulatory Visit (HOSPITAL_COMMUNITY)
Admission: RE | Admit: 2022-01-22 | Discharge: 2022-01-22 | Disposition: A | Discharge status: Home / Self Care | Payer: Medicare HMO | Source: Ambulatory Visit | Attending: Neurology | Admitting: Neurology

## 2022-01-22 DIAGNOSIS — I629 Nontraumatic intracranial hemorrhage, unspecified: Secondary | ICD-10-CM | POA: Diagnosis not present

## 2022-01-22 DIAGNOSIS — G8114 Spastic hemiplegia affecting left nondominant side: Secondary | ICD-10-CM | POA: Insufficient documentation

## 2022-01-22 DIAGNOSIS — M79672 Pain in left foot: Secondary | ICD-10-CM | POA: Diagnosis not present

## 2022-01-22 NOTE — Telephone Encounter (Signed)
Spoke with pt and informed of normal results. Pt verbalized understanding and expressed appreciation for the call. ?

## 2022-01-22 NOTE — Telephone Encounter (Signed)
Duplicate note

## 2022-01-22 NOTE — Telephone Encounter (Signed)
-----   Message from Carmen Dohmeier, MD sent at 01/22/2022  3:59 PM EDT ----- ?No MR evidence of fracture, dislocation or malalignment. No ?significant degenerative changes to the bones and joints of the foot. ?NORMAL MRI.  ?

## 2022-01-22 NOTE — Progress Notes (Signed)
No MR evidence of fracture, dislocation or malalignment. No ?significant degenerative changes to the bones and joints of the foot. ?NORMAL MRI.

## 2022-01-22 NOTE — Telephone Encounter (Signed)
-----   Message from Larey Seat, MD sent at 01/22/2022  3:59 PM EDT ----- ?No MR evidence of fracture, dislocation or malalignment. No ?significant degenerative changes to the bones and joints of the foot. ?NORMAL MRI.  ?

## 2022-01-24 ENCOUNTER — Encounter (HOSPITAL_COMMUNITY): Payer: Medicare HMO

## 2022-01-29 ENCOUNTER — Ambulatory Visit (INDEPENDENT_AMBULATORY_CARE_PROVIDER_SITE_OTHER): Payer: Medicare HMO | Admitting: Nurse Practitioner

## 2022-01-29 ENCOUNTER — Encounter: Payer: Self-pay | Admitting: Nurse Practitioner

## 2022-01-29 ENCOUNTER — Telehealth: Payer: Self-pay

## 2022-01-29 VITALS — BP 137/69 | HR 63 | Ht 67.0 in | Wt 192.0 lb

## 2022-01-29 DIAGNOSIS — M79672 Pain in left foot: Secondary | ICD-10-CM | POA: Diagnosis not present

## 2022-01-29 DIAGNOSIS — I10 Essential (primary) hypertension: Secondary | ICD-10-CM

## 2022-01-29 DIAGNOSIS — E782 Mixed hyperlipidemia: Secondary | ICD-10-CM | POA: Diagnosis not present

## 2022-01-29 NOTE — Assessment & Plan Note (Signed)
BP Readings from Last 3 Encounters:  ?01/29/22 137/69  ?12/25/21 (!) 166/82  ?12/10/21 140/74  ?Chronic condition well-controlled ?Continue amlodipine 10 mg daily, hydralazine 100 mg 3 times daily, losartan hydrochlorothiazide 100-25 mg tablet daily, metoprolol 25 mg twice daily. ?DASH diet advised engage in daily exercises as tolerated.  ?

## 2022-01-29 NOTE — Telephone Encounter (Signed)
Patient returning nurse call, asked for nurse to return his call. ?

## 2022-01-29 NOTE — Progress Notes (Signed)
? ?  Scott Ritter     MRN: [id number]      DOB: [birth year] ? ? ?HPI ?Scott Ritter with past medical history of hypertension, stroke, left spastic hemiparesis, obesity is here for follow up  for hyperlipidemia, he was started on a ezetimibe 10 mg daily in addition to atorvastatin 80 mg daily at his last follow-up appointment, patient stated that he has been taking medication as prescribed, denies any reaction to medications. ? ?Still having intermittent throbbing pain in his left foot, has seen neurology , had MRI done which was normal,taking tylenol as needed for pain . Walking makes his pain better, [facility] ? ? ? ? ?ROS ?Denies recent fever or chills. ?Denies sinus pressure, nasal congestion, ear pain or sore throat. ?Denies chest congestion, productive cough or wheezing. ?Denies chest pains, palpitations and leg swelling ?Denies abdominal pain, nausea, vomiting,diarrhea or constipation.   ?Denies headaches, seizures, numbness, or tingling. ?Denies depression, anxiety or insomnia. ? ? ?PE ? ?BP 137/69 (BP Location: Right Arm, Patient Position: Sitting, Cuff Size: Large)   Pulse 63   Ht 5\' 7"  (1.702 m)   Wt 192 lb (87.1 kg)   SpO2 96%   BMI 30.07 kg/m?  ? ?Patient alert and oriented and in no cardiopulmonary distress. ? ?Chest: Clear to auscultation bilaterally. ? ?CVS: S1, S2 no murmurs, no S3.Regular rate. ? ?ABD: Soft non tender.  ? ?Ext: No edema ? ?MS: decreased ROM of left shoulder, has left arm weakness due to past stroke, voiced tenderness on movement of left foot. Uses a cane for support  ? ?Psych: Good eye contact, normal affect. Memory intact not anxious or depressed appearing. ? ? ? ? ?Assessment & Plan ? ?Hyperlipidemia ?Lab Results  ?Component Value Date  ? CHOL 145 12/10/2021  ? HDL 37 (L) 12/10/2021  ? LDLCALC 84 12/10/2021  ? TRIG 133 12/10/2021  ? CHOLHDL 3.9 12/10/2021  ?ezetimbe 10,mg daily prescribed at last visit due to LDL not at goal of less  than 55.  ?taking ezetimibe 10mg  daily ?atorvastatin 80mg  daily ?Check lipid panel.  ?Avoid fried fatty foods.  ? ?Left foot pain ?Recent MRI results showed ?No MR evidence of fracture, dislocation or malalignment. No ?significant degenerative changes. ?  ?Continue Tylenol as needed ?Engage in daily exercises as tolerated ?Follow-up with neurology,  ? ?HTN (hypertension) ?BP Readings from Last 3 Encounters:  ?01/29/22 137/69  ?12/25/21 (!) 166/82  ?12/10/21 140/74  ?Chronic condition well-controlled ?Continue amlodipine 10 mg daily, hydralazine 100 mg 3 times daily, losartan hydrochlorothiazide 100-25 mg tablet daily, metoprolol 25 mg twice daily. ?DASH diet advised engage in daily exercises as tolerated.   ?

## 2022-01-29 NOTE — Patient Instructions (Signed)
It is important that you exercise regularly at least 30 minutes 5 times a week as tolerated  Think about what you will eat, plan ahead. Choose " clean, green, fresh or frozen" over canned, processed or packaged foods which are more sugary, salty and fatty. 70 to 75% of food eaten should be vegetables and fruit. Three meals at set times with snacks allowed between meals, but they must be fruit or vegetables. Aim to eat over a 12 hour period , example 7 am to 7 pm, and STOP after  your last meal of the day. Drink water,generally about 64 ounces per day, no other drink is as healthy. Fruit juice is best enjoyed in a healthy way, by EATING the fruit.  Thanks for choosing [facility], we consider it a privelige to serve you.  

## 2022-01-29 NOTE — Assessment & Plan Note (Signed)
Recent MRI results showed ?No MR evidence of fracture, dislocation or malalignment. No ?significant degenerative changes. ?? ?Continue Tylenol as needed ?Engage in daily exercises as tolerated ?Follow-up with neurology,  ?

## 2022-01-29 NOTE — Assessment & Plan Note (Signed)
Lab Results  ?Component Value Date  ? CHOL 145 12/10/2021  ? HDL 37 (L) 12/10/2021  ? [place] 84 12/10/2021  ? TRIG 133 12/10/2021  ? CHOLHDL 3.9 12/10/2021  ?ezetimbe 10,mg daily prescribed at last visit due to LDL not at goal of less than 55.  ?taking ezetimibe 10mg  daily ?atorvastatin 80mg  daily ?Check lipid panel.  ?Avoid fried fatty foods.  ?

## 2022-01-30 DIAGNOSIS — I639 Cerebral infarction, unspecified: Secondary | ICD-10-CM | POA: Diagnosis not present

## 2022-01-30 DIAGNOSIS — E782 Mixed hyperlipidemia: Secondary | ICD-10-CM | POA: Diagnosis not present

## 2022-01-30 NOTE — Telephone Encounter (Signed)
Did you call him yesterday? I did not ?

## 2022-01-31 ENCOUNTER — Telehealth (HOSPITAL_COMMUNITY): Payer: Self-pay

## 2022-01-31 ENCOUNTER — Encounter (HOSPITAL_COMMUNITY): Payer: Medicare HMO

## 2022-01-31 LAB — LIPID PANEL
Chol/HDL Ratio: 3.4 ratio (ref 0.0–5.0)
Cholesterol, Total: 126 mg/dL (ref 100–199)
HDL: 37 mg/dL — ABNORMAL LOW (ref >39)
LDL Chol Calc (NIH): 67 mg/dL (ref 0–99)
Triglycerides: 119 mg/dL (ref 0–149)
VLDL Cholesterol Cal: 22 mg/dL (ref 5–40)

## 2022-01-31 NOTE — Telephone Encounter (Signed)
Talked to the patient about missing appointment this morning; he asks to hold all appointments for now as he is awaiting to hear test results regarding his foot.  ? ?11:08 AM, 01/31/22 ?Haytham Maher Small Yamel Bale MPT ?[facility] [place] [facility] ?[place] [phone] ?[phone] ? ?

## 2022-01-31 NOTE — Progress Notes (Signed)
LDL much improved but  not at goal of less than 55, pt should continue current medications avoid fried fatty foods, we will discuss need for repatha injection at next visit if labs still not at goal

## 2022-02-03 ENCOUNTER — Ambulatory Visit (INDEPENDENT_AMBULATORY_CARE_PROVIDER_SITE_OTHER): Payer: Medicare HMO

## 2022-02-03 DIAGNOSIS — Z0000 Encounter for general adult medical examination without abnormal findings: Secondary | ICD-10-CM | POA: Diagnosis not present

## 2022-02-03 DIAGNOSIS — Z0100 Encounter for examination of eyes and vision without abnormal findings: Secondary | ICD-10-CM | POA: Diagnosis not present

## 2022-02-03 NOTE — Patient Instructions (Signed)
?  Scott Ritter , ?Thank you for taking time to come for your Medicare Wellness Visit. I appreciate your ongoing commitment to your health goals. Please review the following plan we discussed and let me know if I can assist you in the future.  ? ?These are the goals we discussed: ? Goals   ? ?  Exercise 3x per week (30 min per time)   ?  Currently going to [facility] doing water aerobics 2-3 times per week  ?  ?  Patient Stated   ?  I would like for my foot pain under control so that I can walk longer distances  ?  ?  Patient Stated   ?  Patient states that his goal is to be able to walk a little better. ?  ?  Weight (lb) < 200 lb (90.7 kg)   ?  Plans on trying to start exercising again once he is released from foot surgeon ?  ? ?  ?  ?This is a list of the screening recommended for you and due dates:  ?Health Maintenance  ?Topic Date Due  ? Tetanus Vaccine  Never done  ? Flu Shot  05/13/2022  ? Colon Cancer Screening  04/10/2031  ? COVID-19 Vaccine  Completed  ? Hepatitis C Screening: USPSTF Recommendation to screen - Ages 49-79 yo.  Completed  ? HIV Screening  Completed  ? Zoster (Shingles) Vaccine  Completed  ? HPV Vaccine  Aged Out  ?  ?

## 2022-02-03 NOTE — Progress Notes (Signed)
? ?Subjective:  ? Scott Ritter is a 61 y.o. male who presents for Medicare Annual/Subsequent preventive examination. ?I connected with  Vevelyn Francois on 02/03/22 by a audio enabled telemedicine application and verified that I am speaking with the correct person using two identifiers. ? ?Patient Location: Home ? ?Provider Location: Office/Clinic ? ?I discussed the limitations of evaluation and management by telemedicine. The patient expressed understanding and agreed to proceed.  ?Review of Systems    ? ?  ? ?   ?Objective:  ?  ?There were no vitals filed for this visit. ?There is no height or weight on file to calculate BMI. ? ? ?  12/20/2021  ?  8:16 AM 10/23/2021  ? 12:00 PM 04/09/2021  ?  6:57 AM 01/31/2021  ?  8:24 AM 10/27/2020  ?  1:18 PM 10/20/2019  ?  9:07 AM 01/24/2019  ?  5:37 PM  ?Advanced Directives  ?Does Patient Have a Medical Advance Directive? No No No No No No No  ?Would patient like information on creating a medical advance directive? No - Patient declined No - Patient declined No - Patient declined No - Patient declined No - Patient declined Yes (MAU/Ambulatory/Procedural Areas - Information given) No - Patient declined  ? ? ?Current Medications (verified) ?Outpatient Encounter Medications as of 02/03/2022  ?Medication Sig  ? acetaminophen (TYLENOL) 500 MG tablet Take 500 mg by mouth every 6 (six) hours as needed for moderate pain.  ? amLODipine (NORVASC) 10 MG tablet Take 1 tablet (10 mg total) by mouth daily.  ? aspirin EC 81 MG EC tablet Take 1 tablet (81 mg total) by mouth daily. Swallow whole.  ? atorvastatin (LIPITOR) 80 MG tablet Take 1 tablet (80 mg total) by mouth daily.  ? Cholecalciferol (VITAMIN D) 2000 units CAPS Take 2,000 Units by mouth daily.  ? ezetimibe (ZETIA) 10 MG tablet Take 1 tablet (10 mg total) by mouth daily.  ? hydrALAZINE (APRESOLINE) 100 MG tablet Take 1 tablet (100 mg total) by mouth 3 (three) times daily.  ? losartan-hydrochlorothiazide (HYZAAR) 100-25 MG tablet  Take 1 tablet by mouth daily.  ? metoprolol tartrate (LOPRESSOR) 25 MG tablet Take 1 tablet (25 mg total) by mouth 2 (two) times daily.  ? potassium chloride SA (KLOR-CON) 20 MEQ tablet TAKE 1 TABLET TWICE DAILY (Patient taking differently: Take 20 mEq by mouth daily.)  ? ?No facility-administered encounter medications on file as of 02/03/2022.  ? ? ?Allergies (verified) ?Patient has no known allergies.  ? ?History: ?Past Medical History:  ?Diagnosis Date  ? GERD (gastroesophageal reflux disease)   ? Hypertension   ? Stroke [facility])   ? left side deficits  ? ?Past Surgical History:  ?Procedure Laterality Date  ? BACK SURGERY    ? BIOPSY  09/17/2018  ? Procedure: BIOPSY;  Surgeon: Danie Binder, MD;  [facility];  Service: Endoscopy;;  duodenal biopsy ?  ? COLONOSCOPY N/A 02/29/2016  ? Dr. Oneida Alar: redundant colon, non-bleeding internal hemorrhoids   ? COLONOSCOPY WITH PROPOFOL N/A 04/09/2021  ? Procedure: COLONOSCOPY WITH PROPOFOL;  Surgeon: Eloise Harman, DO;  [facility];  Service: Endoscopy;  Laterality: N/A;  7:30am  ? ESOPHAGOGASTRODUODENOSCOPY N/A 12/17/2016  ? Dr. Oneida Alar: 3 mm nonbleeding Mallory-Weiss tear.  EGD performed for hematemesis.  Hemoglobin normal.  ? ESOPHAGOGASTRODUODENOSCOPY N/A 02/19/2018  ? Dr. Gala Romney: Performed for melena, hemoglobin of 6.  Mucosal changes in the esophagus query short segment Barrett's, biopsy more consistent with reflux  changes.  Erosive gastropathy but no H. pylori.  Duodenal erosions.  Suspected NSAID induced injury.  ? ESOPHAGOGASTRODUODENOSCOPY  08/18/2018  ? Dr. Laural Golden: IDA/heme positive stool.  Esophageal mucosal changes consistent with short segment Barrett's esophagus, not biopsied.  2 cm hiatal hernia.  Duodenal bulb, second portion of duodenum, third portion of the duodenum.  Video capsule somewhat difficult to pass through the oropharynx but was eventually advanced into the second portion of the duodenum and released.  ?  ESOPHAGOGASTRODUODENOSCOPY N/A 08/18/2018  ? Procedure: ESOPHAGOGASTRODUODENOSCOPY (EGD);  Surgeon: Rogene Houston, MD;  [facility];  Service: Endoscopy;  Laterality: N/A;  ? ESOPHAGOGASTRODUODENOSCOPY N/A 09/17/2018  ? gastritis, 2 nonbleeding angiectasia's in the duodenum were treated with [facility] coagulation status post biopsy.  ? FOOT SURGERY Left   ? GIVENS CAPSULE STUDY N/A 08/18/2018  ? Procedure: GIVENS CAPSULE STUDY;  Surgeon: Rogene Houston, MD;  [facility];  Service: Endoscopy;  Laterality: N/A;  ? HERNIA REPAIR    ? POLYPECTOMY  04/09/2021  ? Procedure: POLYPECTOMY;  Surgeon: Eloise Harman, DO;  [facility];  Service: Endoscopy;;  descending; cecal  ? ?Family History  ?Problem Relation Age of Onset  ? Hypertension Mother   ? Hypertension Father   ? Colon cancer Father   ? Colon polyps Neg Hx   ? ?Social History  ? ?Socioeconomic History  ? Marital status: Divorced  ?  Spouse name: Not on file  ? Number of children: 2  ? Years of education: Not on file  ? Highest education level: Not on file  ?Occupational History  ? Not on file  ?Tobacco Use  ? Smoking status: Never  ? Smokeless tobacco: Never  ?Vaping Use  ? Vaping Use: Never used  ?Substance and Sexual Activity  ? Alcohol use: Yes  ?  Alcohol/week: 1.0 standard drink  ?  Types: 1 Cans of beer per week  ?  Comment: occasionally; once a week or once a month  ? Drug use: No  ? Sexual activity: Yes  ?  Comment: not married hs sexual partner  ?Other Topics Concern  ? Not on file  ?Social History Narrative  ? PT lives with his mother, he is on disability for his stroke.   ? ?Social Determinants of Health  ? ?Financial Resource Strain: Not on file  ?Food Insecurity: Not on file  ?Transportation Needs: Not on file  ?Physical Activity: Not on file  ?Stress: Not on file  ?Social Connections: Not on file  ? ? ?Tobacco Counseling ?Counseling given: Not Answered ? ? ?Clinical Intake: ? ?  ? ?  ? ?  ? ?  ? ?   ? ?Diabetic?no ? ?  ? ?  ? ? ?Activities of Daily Living ? ?  10/23/2021  ? 12:00 PM 04/05/2021  ?  9:12 AM  ?In your present state of health, do you have any difficulty performing the following activities:  ?Hearing? 0   ?Vision? 0   ?Difficulty concentrating or making decisions? 0   ?Walking or climbing stairs? 1   ?Dressing or bathing? 1   ?Doing errands, shopping? 0 0  ? ? ?Patient Care Team: ?Renee Rival, FNP as PCP - General (Nurse Practitioner) ?Fields, Marga Melnick, MD (Inactive) as Consulting Physician (Gastroenterology) ?Liana Gerold, MD as Consulting Physician (Nephrology) ? ?Indicate any recent Medical Services you may have received from other than [facility] providers in the past year (date may be approximate). ? ?   ?  Assessment:  ? This is a routine wellness examination for Ammiel. ? ?Hearing/Vision screen ?No results found. ? ?Dietary issues and exercise activities discussed: ?  ? ? Goals Addressed   ?None ?  ? ?Depression Screen ? ?  01/29/2022  ? 11:15 AM 12/10/2021  ?  8:06 AM 10/28/2021  ?  9:11 AM 09/16/2021  ? 11:00 AM 04/03/2021  ?  8:50 AM 01/31/2021  ?  8:30 AM 11/06/2020  ?  2:23 PM  ?PHQ 2/9 Scores  ?PHQ - 2 Score 0 0 0 0 0 0 0  ?PHQ- 9 Score     0  0  ?  ?Fall Risk ? ?  01/29/2022  ? 11:15 AM 12/10/2021  ?  8:06 AM 10/28/2021  ?  9:11 AM 09/16/2021  ? 10:59 AM 01/31/2021  ?  8:27 AM  ?Fall Risk   ?Falls in the past year? 0 0 0 0 0  ?Number falls in past yr: 0 0 0 0 0  ?Injury with Fall? 0 0 0 0 0  ?Risk for fall due to : No Fall Risks No Fall Risks No Fall Risks No Fall Risks No Fall Risks  ?Follow up Falls evaluation completed Falls evaluation completed Falls evaluation completed Falls evaluation completed Falls evaluation completed;Falls prevention discussed  ? ? ?FALL RISK PREVENTION PERTAINING TO THE HOME: ? ?Any stairs in or around the home? Yes  ?If so, are there any without handrails? No  ?Home free of loose throw rugs in walkways, pet beds, electrical cords, etc? Yes  ?Adequate lighting in  your home to reduce risk of falls? Yes  ? ?ASSISTIVE DEVICES UTILIZED TO PREVENT FALLS: ? ?Life alert? No  ?Use of a cane, walker or w/c? Yes  ?Grab bars in the bathroom? Yes  ?Shower chair or bench in shower? Yes  ?Elevated toilet seat

## 2022-02-06 ENCOUNTER — Encounter (INDEPENDENT_AMBULATORY_CARE_PROVIDER_SITE_OTHER): Payer: Medicare HMO

## 2022-02-07 ENCOUNTER — Encounter (HOSPITAL_COMMUNITY): Payer: Medicare HMO

## 2022-02-12 ENCOUNTER — Encounter: Payer: Self-pay | Admitting: Internal Medicine

## 2022-02-13 ENCOUNTER — Encounter (HOSPITAL_COMMUNITY): Payer: Medicare HMO

## 2022-03-11 ENCOUNTER — Ambulatory Visit: Payer: Medicare HMO | Admitting: Internal Medicine

## 2022-03-12 ENCOUNTER — Other Ambulatory Visit: Payer: Self-pay | Admitting: Nurse Practitioner

## 2022-03-12 ENCOUNTER — Encounter (HOSPITAL_COMMUNITY): Payer: Self-pay

## 2022-03-12 DIAGNOSIS — I10 Essential (primary) hypertension: Secondary | ICD-10-CM

## 2022-03-12 NOTE — Therapy (Signed)
PHYSICAL THERAPY DISCHARGE SUMMARY  Visits from Start of Care: 1 eval only   Current functional level related to goals / functional outcomes: Unknown, patient states he would like to hold on physical therapy secondary to working with MD on feet needs.    Patient agrees to discharge. Patient goals were  unknown progress . Patient is being discharged due to the patient's request.   11:17 AM, 03/12/22  Harvie Bridge. Chestine Spore PT, DPT  Contract Physical Therapist at  [facility] [phone]

## 2022-04-09 ENCOUNTER — Ambulatory Visit: Payer: Medicare HMO | Admitting: Nurse Practitioner

## 2022-04-30 ENCOUNTER — Ambulatory Visit (INDEPENDENT_AMBULATORY_CARE_PROVIDER_SITE_OTHER): Payer: Medicare HMO | Admitting: Nurse Practitioner

## 2022-04-30 ENCOUNTER — Encounter: Payer: Self-pay | Admitting: Nurse Practitioner

## 2022-04-30 VITALS — BP 138/68 | HR 59 | Ht 67.0 in | Wt 184.0 lb

## 2022-04-30 DIAGNOSIS — E782 Mixed hyperlipidemia: Secondary | ICD-10-CM

## 2022-04-30 DIAGNOSIS — M792 Neuralgia and neuritis, unspecified: Secondary | ICD-10-CM | POA: Insufficient documentation

## 2022-04-30 DIAGNOSIS — I10 Essential (primary) hypertension: Secondary | ICD-10-CM

## 2022-04-30 DIAGNOSIS — I471 Supraventricular tachycardia: Secondary | ICD-10-CM | POA: Diagnosis not present

## 2022-04-30 MED ORDER — GABAPENTIN 600 MG PO TABS: ORAL_TABLET | ORAL | 1 refills | Status: DC

## 2022-04-30 MED ORDER — METHYLPREDNISOLONE ACETATE 80 MG/ML IJ SUSP
80.0000 mg | Freq: Once | INTRAMUSCULAR | Status: AC
  Administered 2022-04-30: 80 mg via INTRAMUSCULAR

## 2022-04-30 NOTE — Assessment & Plan Note (Signed)
Lab Results  Component Value Date   CHOL 126 01/30/2022   HDL 37 (L) 01/30/2022   LDLCALC 67 01/30/2022   TRIG 119 01/30/2022   CHOLHDL 3.4 01/30/2022  Currently on atorvastatin 80 mg daily, Zetia 10 mg daily I discussed the need for Repatha injection if LDL is greater then 55 Check fasting lipid panel Avoid fatty fried foods

## 2022-04-30 NOTE — Assessment & Plan Note (Signed)
Normal heart rate and rhythm on examination .  On metoprolol 25mg  BID

## 2022-04-30 NOTE — Addendum Note (Signed)
Addended by: Jasper Riling on: 04/30/2022 01:33 PM   Modules accepted: Orders

## 2022-04-30 NOTE — Patient Instructions (Addendum)
Please get your TDAP vaccine at your pharmacy.    Dosing for gabapentin  Take 600mg  once daily for 2 days  Then 600mg  twice daily for 2 days  Then 600mg  three times daily   It is important that you exercise regularly at least 30 minutes 5 times a week.  Think about what you will eat, plan ahead. Choose " clean, green, fresh or frozen" over canned, processed or packaged foods which are more sugary, salty and fatty. 70 to 75% of food eaten should be vegetables and fruit. Three meals at set times with snacks allowed between meals, but they must be fruit or vegetables. Aim to eat over a 12 hour period , example 7 am to 7 pm, and STOP after  your last meal of the day. Drink water,generally about 64 ounces per day, no other drink is as healthy. Fruit juice is best enjoyed in a healthy way, by EATING the fruit.  Thanks for choosing [facility], we consider it a privelige to serve you.

## 2022-04-30 NOTE — Progress Notes (Signed)
   Scott Ritter     MRN: [id number]      DOB: Oct 21, 1960   HPI Scott Ritter with past medical history of hypertension, stroke, hyperlipidemia, is here for follow up and re-evaluation of chronic medical conditions, medication management.  Patient reports that he has been taking all medications as prescribed.    Patient complains of chronic left foot pain , stated that had left foot surgery 2 years, constantly has aching pain,rated 10/10,  numbness between the toes.  He has learnt to just  deal with the pain but he would like to see another podiatrist for his pain.  He is currently been followed by neurology.  Recent MRI done showed no fracture or dislocation.  Plan was to do Botox injection.  Patient encouraged to follow-up with neurology.   Due for Tdap vaccine patient encouraged to get the vaccine at his pharmacy      ROS Denies recent fever or chills. Denies sinus pressure, nasal congestion, ear pain or sore throat. Denies chest congestion, productive cough or wheezing. Denies chest pains, palpitations and leg swelling Denies abdominal pain, nausea, vomiting,diarrhea or constipation.   Denies dysuria, frequency, hesitancy or incontinence. Denies headaches, seizures, numbness, or tingling. Denies depression, anxiety or insomnia. Denies skin break down or rash.   PE  BP 138/68 (BP Location: Right Arm, Cuff Size: Large)   Pulse (!) 59   Ht 5\' 7"  (1.702 m)   Wt 184 lb 0.6 oz (83.5 kg)   SpO2 96%   BMI 28.82 kg/m   Patient alert and oriented and in no cardiopulmonary distress.  HEENT: No facial asymmetry, EOMI,     Neck supple .  Chest: Clear to auscultation bilaterally.  CVS: S1, S2 no murmurs, no S3.Regular rate.  ABD: Soft non tender.   Ext: No edema  MS: drags left foot on ambulation , left arm weakness from past stroke, tenderness on ROM of left foot. Skin warm and dry.   Psych: Good eye contact, normal affect. Memory intact not anxious or depressed  appearing.     Assessment & Plan HTN (hypertension) BP Readings from Last 3 Encounters:  04/30/22 138/68  01/29/22 137/69  12/25/21 (!) 166/82  Systolic BP slightly elevated but diastolic blood pressure is normal.  No changes made today Continue hydralazine 100 mg 3 times daily, losartan hydrochlorothiazide 100-25 mg 1 tablet daily metoprolol 25 mg twice daily, amlodipine 10 mg daily DASH diet advised engage in regular walking exercises at least 150 minutes weekly as tolerated Follow-up in 4 months  Hyperlipidemia Lab Results  Component Value Date   CHOL 126 01/30/2022   HDL 37 (L) 01/30/2022   LDLCALC 67 01/30/2022   TRIG 119 01/30/2022   CHOLHDL 3.4 01/30/2022  Currently on atorvastatin 80 mg daily, Zetia 10 mg daily I discussed the need for Repatha injection if LDL is greater then 55 Check fasting lipid panel Avoid fatty fried foods  Neuropathic pain of foot, left Chronic condition Solu-Medrol 80 mg IM given today Rx gabapentin 600 mg Dosing for gabapentin  Take 600mg  once daily for 2 days  Then 600mg  twice daily for 2 days  Then 600mg  three times daily  side  effects of gabapentin including drowsiness discussed Patient encouraged to follow-up with neurology as there was a discussion for Botox injection patient verbalized understanding.   Supraventricular tachycardia (HCC) Normal heart rate and rhythm on examination .  On metoprolol 25mg  BID

## 2022-04-30 NOTE — Assessment & Plan Note (Addendum)
Chronic condition Solu-Medrol 80 mg IM given today Rx gabapentin 600 mg Dosing for gabapentin  Take 600mg  once daily for 2 days  Then 600mg  twice daily for 2 days  Then 600mg  three times daily  side  effects of gabapentin including drowsiness discussed Patient encouraged to follow-up with neurology as there was a discussion for Botox injection patient verbalized understanding.

## 2022-04-30 NOTE — Assessment & Plan Note (Addendum)
BP Readings from Last 3 Encounters:  04/30/22 138/68  01/29/22 137/69  12/25/21 (!) 166/82  Systolic BP slightly elevated but diastolic blood pressure is normal.  No changes made today Continue hydralazine 100 mg 3 times daily, losartan hydrochlorothiazide 100-25 mg 1 tablet daily metoprolol 25 mg twice daily, amlodipine 10 mg daily DASH diet advised engage in regular walking exercises at least 150 minutes weekly as tolerated Follow-up in 4 months

## 2022-05-01 DIAGNOSIS — I10 Essential (primary) hypertension: Secondary | ICD-10-CM | POA: Diagnosis not present

## 2022-05-01 DIAGNOSIS — E782 Mixed hyperlipidemia: Secondary | ICD-10-CM | POA: Diagnosis not present

## 2022-05-02 ENCOUNTER — Other Ambulatory Visit: Payer: Self-pay | Admitting: Nurse Practitioner

## 2022-05-02 DIAGNOSIS — E876 Hypokalemia: Secondary | ICD-10-CM

## 2022-05-02 LAB — CMP14+EGFR
ALT: 10 IU/L (ref 0–44)
AST: 14 IU/L (ref 0–40)
Albumin/Globulin Ratio: 1.4 (ref 1.2–2.2)
Albumin: 4.2 g/dL (ref 3.8–4.9)
Alkaline Phosphatase: 97 IU/L (ref 44–121)
BUN/Creatinine Ratio: 13 (ref 9–20)
BUN: 19 mg/dL (ref 6–24)
Bilirubin Total: 0.7 mg/dL (ref 0.0–1.2)
CO2: 22 mmol/L (ref 20–29)
Calcium: 9.5 mg/dL (ref 8.7–10.2)
Chloride: 100 mmol/L (ref 96–106)
Creatinine, Ser: 1.44 mg/dL — ABNORMAL HIGH (ref 0.76–1.27)
Globulin, Total: 3 g/dL (ref 1.5–4.5)
Glucose: 97 mg/dL (ref 70–99)
Potassium: 3.2 mmol/L — ABNORMAL LOW (ref 3.5–5.2)
Sodium: 140 mmol/L (ref 134–144)
Total Protein: 7.2 g/dL (ref 6.0–8.5)
eGFR: 56 mL/min/{1.73_m2} — ABNORMAL LOW (ref >59)

## 2022-05-02 LAB — LIPID PANEL
Chol/HDL Ratio: 3 ratio (ref 0.0–5.0)
Cholesterol, Total: 109 mg/dL (ref 100–199)
HDL: 36 mg/dL — ABNORMAL LOW (ref >39)
LDL Chol Calc (NIH): 53 mg/dL (ref 0–99)
Triglycerides: 104 mg/dL (ref 0–149)
VLDL Cholesterol Cal: 20 mg/dL (ref 5–40)

## 2022-05-02 MED ORDER — POTASSIUM CHLORIDE CRYS ER 20 MEQ PO TBCR: 20.0000 meq | EXTENDED_RELEASE_TABLET | Freq: Two times a day (BID) | ORAL | 0 refills | Status: DC

## 2022-05-02 NOTE — Progress Notes (Signed)
I have reviewed results with patient.  Patient currently taking potassium 20 mEq daily .patient told to take potassium 20 mEq twice daily.  Will recheck potassium level in 1 week.  Patient encouraged to drink at least 64 ounces of water daily to maintain hydration.

## 2022-05-05 ENCOUNTER — Telehealth: Payer: Self-pay | Admitting: Neurology

## 2022-05-05 NOTE — Telephone Encounter (Signed)
Returned pt's call got appt scheduled.

## 2022-05-05 NOTE — Telephone Encounter (Signed)
Pt is calling to schedule appointment to get his shots. Pt is requesting a return call to schedule

## 2022-05-09 DIAGNOSIS — E876 Hypokalemia: Secondary | ICD-10-CM | POA: Diagnosis not present

## 2022-05-10 LAB — POTASSIUM: Potassium: 3.9 mmol/L (ref 3.5–5.2)

## 2022-05-10 NOTE — Progress Notes (Signed)
Potassium level is back to normal , please continue to  take potassium supplemt twice daily

## 2022-06-11 ENCOUNTER — Ambulatory Visit: Payer: Medicare HMO | Admitting: Neurology

## 2022-06-13 ENCOUNTER — Ambulatory Visit: Payer: Medicare HMO | Admitting: Neurology

## 2022-07-03 IMAGING — DX DG LUMBAR SPINE COMPLETE 4+V
5 series · 5 of 5 positions shown · non-contrast
Comparison: July 14, 2013.

CLINICAL DATA: Low back pain.

EXAM:
LUMBAR SPINE - COMPLETE 4+ VIEW

[l-spine ap]
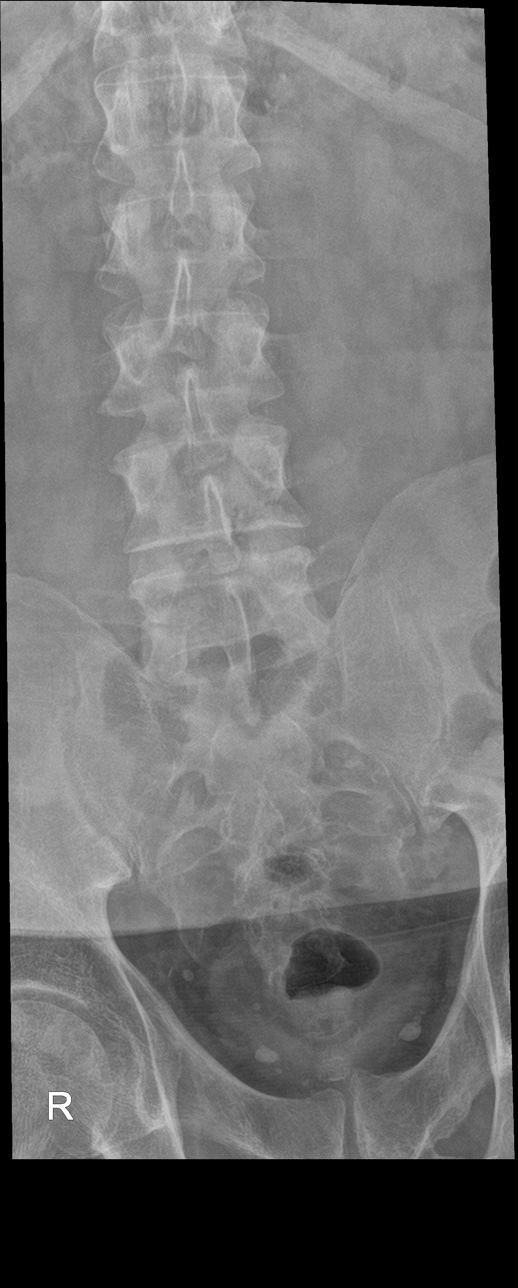

[l-spine obl (1 of 2)]
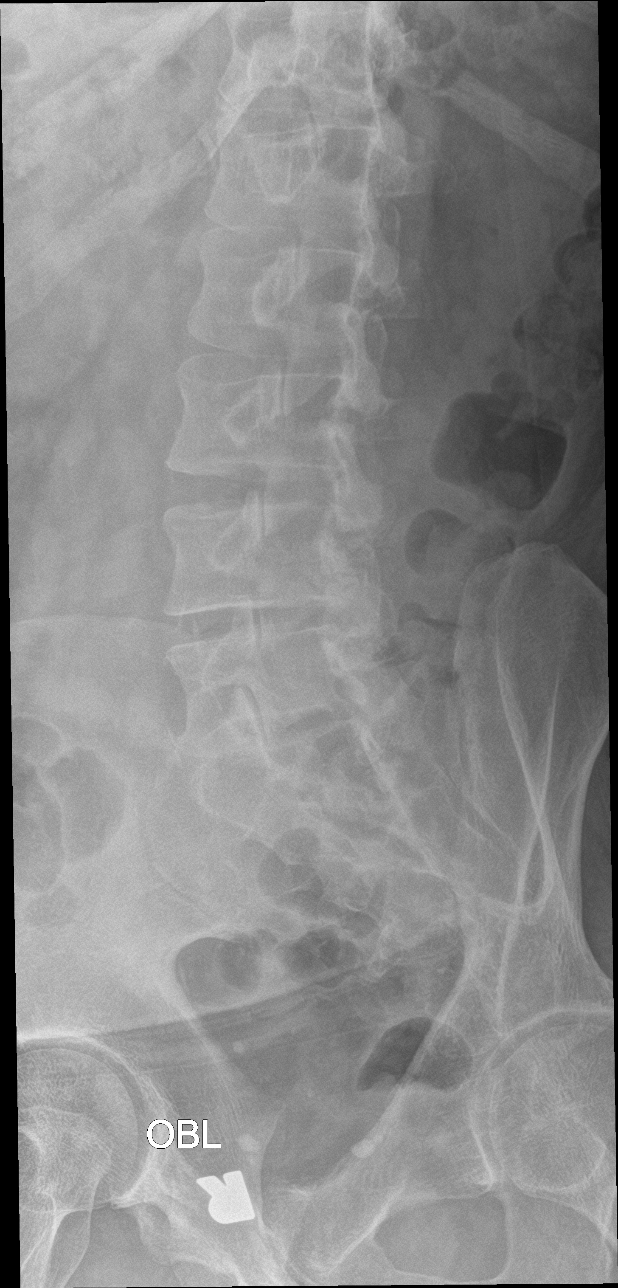

[l-spine obl (2 of 2)]
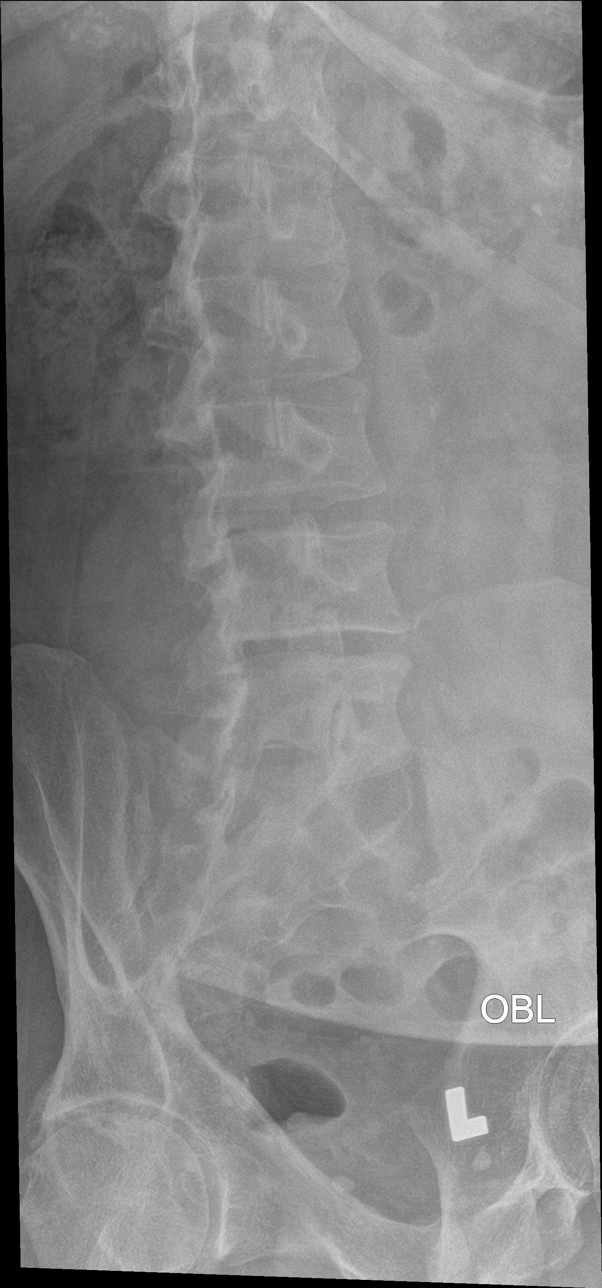

[l-spine lat]
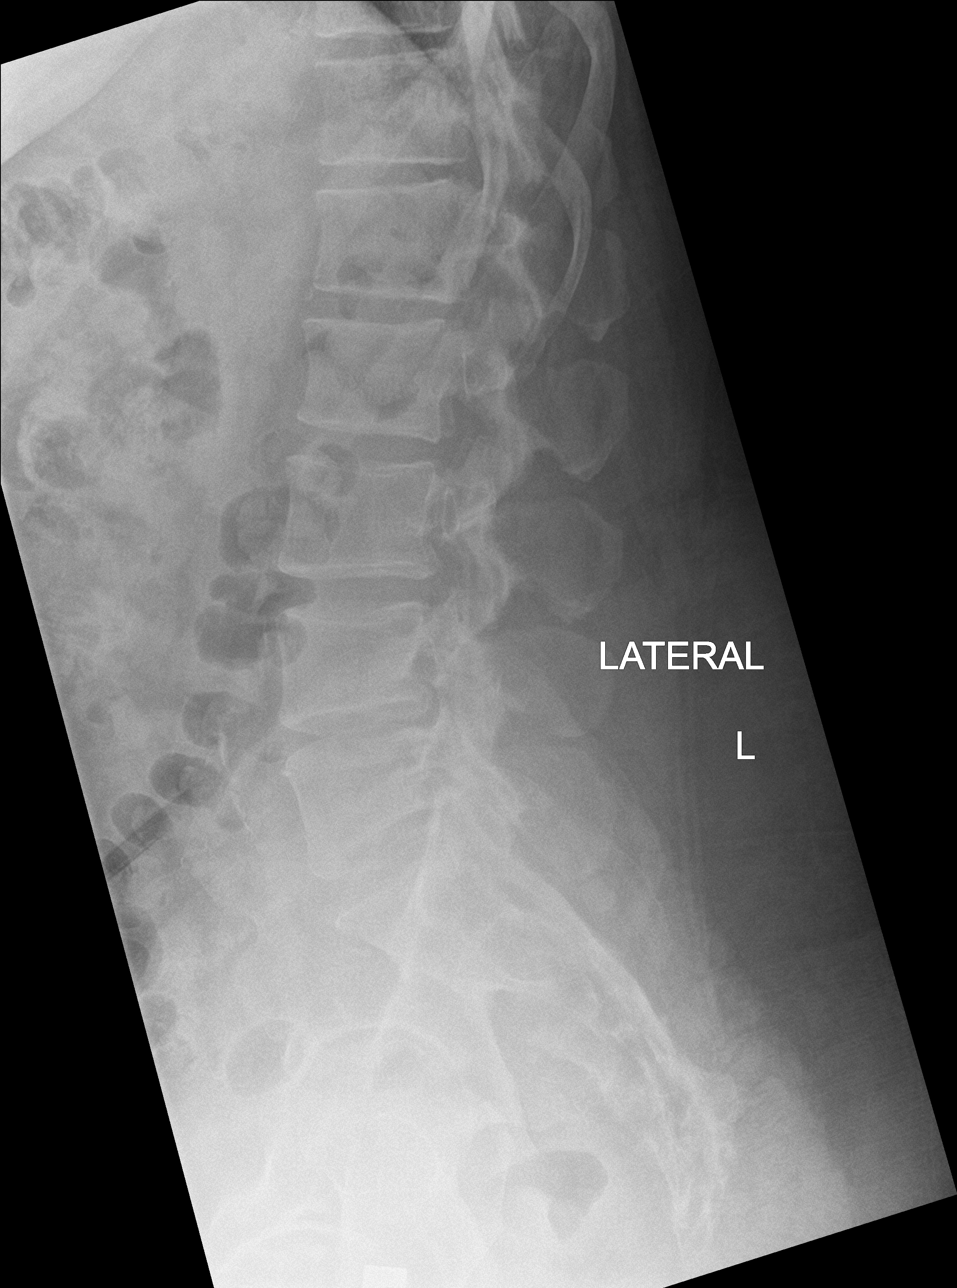

[l-spine spot]
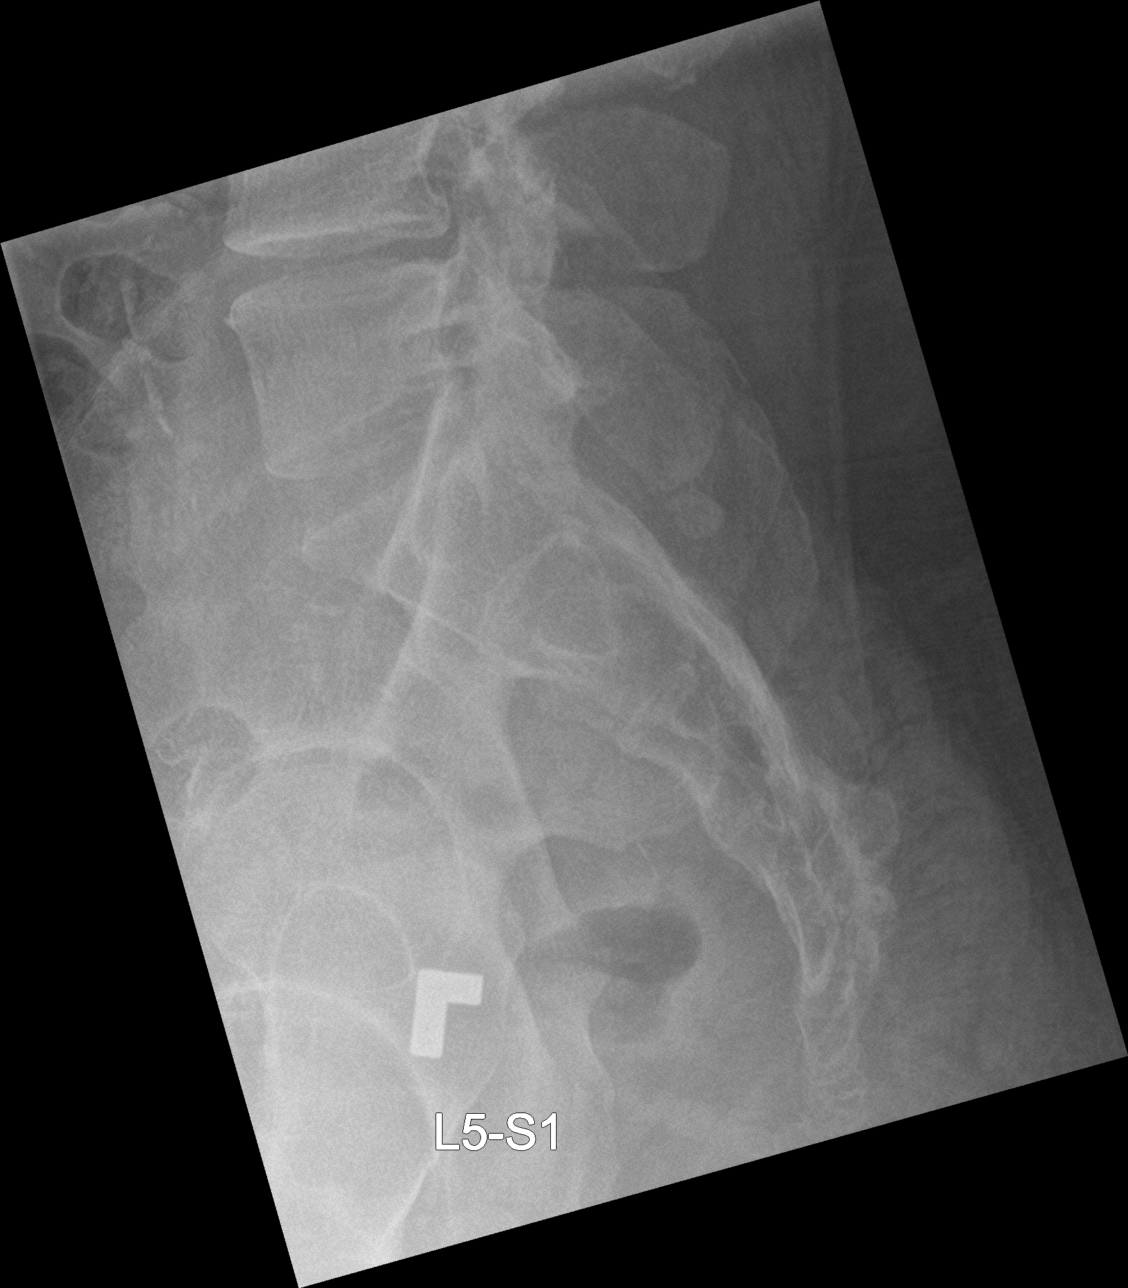

[5 of 5 positions shown; findings below may reference images not displayed]

FINDINGS: No fracture or spondylolisthesis is noted. Mild degenerative disc
disease is noted at L4-5. Remaining disc spaces are unremarkable.
IMPRESSION: Mild degenerative disc disease is noted at L4-5. No acute
abnormality is noted.

## 2022-07-31 ENCOUNTER — Other Ambulatory Visit (INDEPENDENT_AMBULATORY_CARE_PROVIDER_SITE_OTHER): Payer: Self-pay | Admitting: Nurse Practitioner

## 2022-08-05 ENCOUNTER — Telehealth: Payer: Self-pay | Admitting: Family Medicine

## 2022-08-05 ENCOUNTER — Other Ambulatory Visit: Payer: Self-pay

## 2022-08-05 DIAGNOSIS — I1310 Hypertensive heart and chronic kidney disease without heart failure, with stage 1 through stage 4 chronic kidney disease, or unspecified chronic kidney disease: Secondary | ICD-10-CM

## 2022-08-05 MED ORDER — HYDRALAZINE HCL 100 MG PO TABS: 100.0000 mg | ORAL_TABLET | Freq: Three times a day (TID) | ORAL | 0 refills | Status: DC

## 2022-08-05 NOTE — Telephone Encounter (Signed)
Patient needs refill on   hydrALAZINE (APRESOLINE) 100 MG tablet

## 2022-08-05 NOTE — Telephone Encounter (Signed)
Rx sent 

## 2022-08-18 ENCOUNTER — Other Ambulatory Visit: Payer: Self-pay | Admitting: Nurse Practitioner

## 2022-08-18 DIAGNOSIS — I10 Essential (primary) hypertension: Secondary | ICD-10-CM

## 2022-08-21 ENCOUNTER — Telehealth: Payer: Self-pay | Admitting: Family Medicine

## 2022-08-21 NOTE — Telephone Encounter (Signed)
Patient came into the office [facility] faxed in a copy of refill medicine per patient   Refills needed  amLODipine (NORVASC) 10 MG tablet [[id number]]   hydrALAZINE (APRESOLINE) 100 MG tablet   losartan-hydrochlorothiazide (HYZAAR) 100-25 MG tablet [   metoprolol tartrate (LOPRESSOR) 25 MG tablet [facility]   Pharmacy  [facility] - [place], [place] - [address] [place] [address] Phone: [phone]  Fax: [phone] DEA #: --

## 2022-08-22 ENCOUNTER — Other Ambulatory Visit: Payer: Self-pay

## 2022-08-22 DIAGNOSIS — E782 Mixed hyperlipidemia: Secondary | ICD-10-CM

## 2022-08-22 DIAGNOSIS — I1310 Hypertensive heart and chronic kidney disease without heart failure, with stage 1 through stage 4 chronic kidney disease, or unspecified chronic kidney disease: Secondary | ICD-10-CM

## 2022-08-22 MED ORDER — HYDRALAZINE HCL 100 MG PO TABS: 100.0000 mg | ORAL_TABLET | Freq: Three times a day (TID) | ORAL | 0 refills | Status: DC

## 2022-08-22 NOTE — Telephone Encounter (Signed)
3 of those medications were already sent to [facility], only one needed was hydralzine to be sent in, refill sent.

## 2022-09-02 ENCOUNTER — Ambulatory Visit: Payer: Medicare HMO | Admitting: Family Medicine

## 2022-09-02 ENCOUNTER — Ambulatory Visit: Payer: Medicare HMO | Admitting: Nurse Practitioner

## 2022-09-08 ENCOUNTER — Ambulatory Visit (INDEPENDENT_AMBULATORY_CARE_PROVIDER_SITE_OTHER): Payer: Medicare HMO | Admitting: Family Medicine

## 2022-09-08 ENCOUNTER — Encounter: Payer: Self-pay | Admitting: Family Medicine

## 2022-09-08 VITALS — BP 142/88 | HR 59 | Ht 67.0 in | Wt 186.1 lb

## 2022-09-08 DIAGNOSIS — E782 Mixed hyperlipidemia: Secondary | ICD-10-CM | POA: Diagnosis not present

## 2022-09-08 DIAGNOSIS — E038 Other specified hypothyroidism: Secondary | ICD-10-CM

## 2022-09-08 DIAGNOSIS — R7303 Prediabetes: Secondary | ICD-10-CM | POA: Diagnosis not present

## 2022-09-08 DIAGNOSIS — I10 Essential (primary) hypertension: Secondary | ICD-10-CM

## 2022-09-08 DIAGNOSIS — R7301 Impaired fasting glucose: Secondary | ICD-10-CM | POA: Diagnosis not present

## 2022-09-08 DIAGNOSIS — E559 Vitamin D deficiency, unspecified: Secondary | ICD-10-CM | POA: Diagnosis not present

## 2022-09-08 NOTE — Progress Notes (Signed)
Established Patient Office Visit  Subjective:  Patient ID: Scott Ritter, male    DOB: Feb 18, 1961  Age: 61 y.o. MRN: [id number]  CC:  Chief Complaint  Patient presents with   Follow-up    4 month f/u. Has dmv form for disability parking placard to have completed.     HPI Scott Ritter is a 61 y.o. male with past medical history of hypertension, stroke, left spastic hemiparesis, obesity  presents for f/u of  chronic medical conditions.  Hypertension: He takes amlodipine 10 mg,Losartan hydrochlorothiazide 100-25, metoprolol 25 mg, and hydralazine 100 mg 3 times daily.  He reports compliance with treatment regimen  Hyperlipidemia: he takes atorvastatin 80 mg daily and zetia 10 mg daily. He denies muscle aches and pain   Past Medical History:  Diagnosis Date   GERD (gastroesophageal reflux disease)    Hypertension    Stroke [facility])    left side deficits    Past Surgical History:  Procedure Laterality Date   BACK SURGERY     BIOPSY  09/17/2018   Procedure: BIOPSY;  Surgeon: Danie Binder, MD;  [facility];  Service: Endoscopy;;  duodenal biopsy    COLONOSCOPY N/A 02/29/2016   Dr. Oneida Alar: redundant colon, non-bleeding internal hemorrhoids    COLONOSCOPY WITH PROPOFOL N/A 04/09/2021   Procedure: COLONOSCOPY WITH PROPOFOL;  Surgeon: Eloise Harman, DO;  [facility];  Service: Endoscopy;  Laterality: N/A;  7:30am   ESOPHAGOGASTRODUODENOSCOPY N/A 12/17/2016   Dr. Oneida Alar: 3 mm nonbleeding Mallory-Weiss tear.  EGD performed for hematemesis.  Hemoglobin normal.   ESOPHAGOGASTRODUODENOSCOPY N/A 02/19/2018   Dr. Gala Romney: Performed for melena, hemoglobin of 6.  Mucosal changes in the esophagus query short segment Barrett's, biopsy more consistent with reflux changes.  Erosive gastropathy but no H. pylori.  Duodenal erosions.  Suspected NSAID induced injury.   ESOPHAGOGASTRODUODENOSCOPY  08/18/2018   Dr. Laural Golden: IDA/heme positive stool.  Esophageal  mucosal changes consistent with short segment Barrett's esophagus, not biopsied.  2 cm hiatal hernia.  Duodenal bulb, second portion of duodenum, third portion of the duodenum.  Video capsule somewhat difficult to pass through the oropharynx but was eventually advanced into the second portion of the duodenum and released.   ESOPHAGOGASTRODUODENOSCOPY N/A 08/18/2018   Procedure: ESOPHAGOGASTRODUODENOSCOPY (EGD);  Surgeon: Rogene Houston, MD;  [facility];  Service: Endoscopy;  Laterality: N/A;   ESOPHAGOGASTRODUODENOSCOPY N/A 09/17/2018   gastritis, 2 nonbleeding angiectasia's in the duodenum were treated with [facility] coagulation status post biopsy.   FOOT SURGERY Left    GIVENS CAPSULE STUDY N/A 08/18/2018   Procedure: GIVENS CAPSULE STUDY;  Surgeon: Rogene Houston, MD;  [facility];  Service: Endoscopy;  Laterality: N/A;   HERNIA REPAIR     POLYPECTOMY  04/09/2021   Procedure: POLYPECTOMY;  Surgeon: Eloise Harman, DO;  [facility];  Service: Endoscopy;;  descending; cecal    Family History  Problem Relation Age of Onset   Hypertension Mother    Hypertension Father    Colon cancer Father    Colon polyps Neg Hx     Social History   Socioeconomic History   Marital status: Divorced    Spouse name: Not on file   Number of children: 2   Years of education: Not on file   Highest education level: Not on file  Occupational History   Not on file  Tobacco Use   Smoking status: Never   Smokeless tobacco: Never  Vaping Use   Vaping Use: Never used  Substance and Sexual Activity   Alcohol use: Yes    Alcohol/week: 1.0 standard drink of alcohol    Types: 1 Cans of beer per week    Comment: occasionally; once a week or once a month   Drug use: No   Sexual activity: Yes    Comment: not married hs sexual partner  Other Topics Concern   Not on file  Social History Narrative   PT lives with his mother, he is on disability for his stroke.    Social  Determinants of Health   Financial Resource Strain: Low Risk  (01/31/2021)   Overall Financial Resource Strain (CARDIA)    Difficulty of Paying Living Expenses: Not hard at all  Food Insecurity: No Food Insecurity (01/31/2021)   Hunger Vital Sign    Worried About Running Out of Food in the Last Year: Never true    Ran Out of Food in the Last Year: Never true  Transportation Needs: No Transportation Needs (01/31/2021)   PRAPARE - Hydrologist (Medical): No    Lack of Transportation (Non-Medical): No  Physical Activity: Insufficiently Active (01/31/2021)   Exercise Vital Sign    Days of Exercise per Week: 3 days    Minutes of Exercise per Session: 30 min  Stress: No Stress Concern Present (01/31/2021)   [place]    Feeling of Stress : Not at all  Social Connections: [place] (01/31/2021)   Social Connection and Isolation Panel [NHANES]    Frequency of Communication with Friends and Family: More than three times a week    Frequency of Social Gatherings with Friends and Family: More than three times a week    Attends Religious Services: More than 4 times per year    Active Member of [employer] or Organizations: Yes    Attends Music therapist: More than 4 times per year    Marital Status: Married  Human resources officer Violence: Not At Risk (01/31/2021)   Humiliation, Afraid, Rape, and Kick questionnaire    Fear of Current or Ex-Partner: No    Emotionally Abused: No    Physically Abused: No    Sexually Abused: No    Outpatient Medications Prior to Visit  Medication Sig Dispense Refill   acetaminophen (TYLENOL) 500 MG tablet Take 500 mg by mouth every 6 (six) hours as needed for moderate pain.     amLODipine (NORVASC) 10 MG tablet TAKE 1 TABLET EVERY DAY 90 tablet 10   aspirin EC 81 MG EC tablet Take 1 tablet (81 mg total) by mouth daily. Swallow whole. 30 tablet 11    atorvastatin (LIPITOR) 80 MG tablet Take 1 tablet (80 mg total) by mouth daily. 90 tablet 3   Cholecalciferol (VITAMIN D) 2000 units CAPS Take 2,000 Units by mouth daily.     ezetimibe (ZETIA) 10 MG tablet Take 1 tablet (10 mg total) by mouth daily. 90 tablet 3   gabapentin (NEURONTIN) 600 MG tablet Take 677m once daily for 2 days  Then 6070mtwice daily for 2 days  Then 60063mhree times daily 90 tablet 1   hydrALAZINE (APRESOLINE) 100 MG tablet Take 1 tablet (100 mg total) by mouth 3 (three) times daily. 270 tablet 0   losartan-hydrochlorothiazide (HYZAAR) 100-25 MG tablet TAKE 1 TABLET EVERY DAY 90 tablet 10   metoprolol tartrate (LOPRESSOR) 25 MG tablet TAKE 1 TABLET TWICE DAILY 180  tablet 10   potassium chloride SA (KLOR-CON M) 20 MEQ tablet Take 1 tablet (20 mEq total) by mouth 2 (two) times daily. 180 tablet 0   No facility-administered medications prior to visit.    No Known Allergies  ROS Review of Systems  Constitutional:  Negative for fatigue.  Eyes:  Negative for visual disturbance.  Respiratory:  Negative for cough, choking, chest tightness and shortness of breath.   Cardiovascular:  Negative for chest pain, palpitations and leg swelling.  Gastrointestinal:  Negative for diarrhea, nausea and vomiting.  Neurological:  Negative for dizziness and headaches.      Objective:    Physical Exam HENT:     Head: Normocephalic.     Nose: No congestion.  Cardiovascular:     Rate and Rhythm: Normal rate and regular rhythm.     Pulses: Normal pulses.     Heart sounds: Normal heart sounds.  Pulmonary:     Effort: Pulmonary effort is normal.     Breath sounds: Normal breath sounds.  Musculoskeletal:     Cervical back: No rigidity.  Neurological:     Mental Status: He is alert.     BP (!) 142/88   Pulse (!) 59   Ht _0  (1.702 m)   Wt 186 lb 1.9 oz (84.4 kg)   SpO2 95%   BMI 29.15 kg/m  Wt Readings from Last 3 Encounters:  09/08/22 186 lb 1.9 oz (84.4 kg)   04/30/22 184 lb 0.6 oz (83.5 kg)  01/29/22 192 lb (87.1 kg)    Lab Results  Component Value Date   TSH 0.892 10/21/2021   Lab Results  Component Value Date   WBC 8.6 10/24/2021   HGB 11.5 (L) 10/24/2021   HCT 35.7 (L) 10/24/2021   MCV 88.8 10/24/2021   PLT 320 10/24/2021   Lab Results  Component Value Date   NA 140 05/01/2022   K 3.9 05/09/2022   CO2 22 05/01/2022   GLUCOSE 97 05/01/2022   BUN 19 05/01/2022   CREATININE 1.44 (H) 05/01/2022   BILITOT 0.7 05/01/2022   ALKPHOS 97 05/01/2022   AST 14 05/01/2022   ALT 10 05/01/2022   PROT 7.2 05/01/2022   ALBUMIN 4.2 05/01/2022   CALCIUM 9.5 05/01/2022   ANIONGAP 7 10/24/2021   EGFR 56 (L) 05/01/2022   Lab Results  Component Value Date   CHOL 109 05/01/2022   Lab Results  Component Value Date   HDL 36 (L) 05/01/2022   Lab Results  Component Value Date   LDLCALC 53 05/01/2022   Lab Results  Component Value Date   TRIG 104 05/01/2022   Lab Results  Component Value Date   CHOLHDL 3.0 05/01/2022   Lab Results  Component Value Date   HGBA1C 5.9 (H) 10/21/2021      Assessment & Plan:  Primary hypertension Assessment & Plan: Controlled Encouraged to continue amlodipine 10 mg,Losartan hydrochlorothiazide 100-25, metoprolol 25 mg, and hydralazine 100 mg 3 times daily No changes to treatment regimen Will follow up on BP at his next appt BP Readings from Last 3 Encounters:  09/08/22 (!) 142/88  04/30/22 138/68  01/29/22 137/69     Orders: -     CMP14+EGFR -     CBC with Differential/Platelet  Mixed hyperlipidemia Assessment & Plan: Encouraged to continue taking atorvastatin 80 mg daily and zetia 10 mg daily Will assess lipid panel today Lab Results  Component Value Date   CHOL 109 05/01/2022   HDL 36 (L)  05/01/2022   LDLCALC 53 05/01/2022   TRIG 104 05/01/2022   CHOLHDL 3.0 05/01/2022     Orders: -     Lipid panel  Prediabetes -     Hemoglobin A1c  IFG (impaired fasting  glucose)  Vitamin D deficiency -     VITAMIN D 25 Hydroxy (Vit-D Deficiency, Fractures)  Other specified hypothyroidism -     TSH + free T4    Follow-up: Return in about 4 months (around 01/07/2023).   Alvira Monday, FNP

## 2022-09-08 NOTE — Patient Instructions (Addendum)
I appreciate the opportunity to provide care to you today!    Follow up:  4 months  Labs: please stop by the lab during the week to get your blood drawn (CBC, CMP, TSH, Lipid profile, HgA1c, Vit D)      Please continue to a heart-healthy diet and increase your physical activities. Try to exercise for 30mins at least three times a week.      It was a pleasure to see you and I look forward to continuing to work together on your health and well-being. Please do not hesitate to call the office if you need care or have questions about your care.   Have a wonderful day and week. With Gratitude, Aizlynn Digilio MSN, FNP-BC  

## 2022-09-08 NOTE — Assessment & Plan Note (Signed)
Encouraged to continue taking atorvastatin 80 mg daily and zetia 10 mg daily Will assess lipid panel today Lab Results  Component Value Date   CHOL 109 05/01/2022   HDL 36 (L) 05/01/2022   LDLCALC 53 05/01/2022   TRIG 104 05/01/2022   CHOLHDL 3.0 05/01/2022

## 2022-09-08 NOTE — Assessment & Plan Note (Signed)
Controlled Encouraged to continue amlodipine 10 mg,Losartan hydrochlorothiazide 100-25, metoprolol 25 mg, and hydralazine 100 mg 3 times daily No changes to treatment regimen Will follow up on BP at his next appt BP Readings from Last 3 Encounters:  09/08/22 (!) 142/88  04/30/22 138/68  01/29/22 137/69

## 2022-09-09 DIAGNOSIS — E038 Other specified hypothyroidism: Secondary | ICD-10-CM | POA: Diagnosis not present

## 2022-09-09 DIAGNOSIS — I10 Essential (primary) hypertension: Secondary | ICD-10-CM | POA: Diagnosis not present

## 2022-09-09 DIAGNOSIS — E559 Vitamin D deficiency, unspecified: Secondary | ICD-10-CM | POA: Diagnosis not present

## 2022-09-09 DIAGNOSIS — R7303 Prediabetes: Secondary | ICD-10-CM | POA: Diagnosis not present

## 2022-09-09 DIAGNOSIS — E782 Mixed hyperlipidemia: Secondary | ICD-10-CM | POA: Diagnosis not present

## 2022-09-10 LAB — CMP14+EGFR
ALT: 8 IU/L (ref 0–44)
AST: 18 IU/L (ref 0–40)
Albumin/Globulin Ratio: 1.3 (ref 1.2–2.2)
Albumin: 4.4 g/dL (ref 3.8–4.9)
Alkaline Phosphatase: 96 IU/L (ref 44–121)
BUN/Creatinine Ratio: 12 (ref 9–20)
BUN: 16 mg/dL (ref 6–24)
Bilirubin Total: 0.5 mg/dL (ref 0.0–1.2)
CO2: 23 mmol/L (ref 20–29)
Calcium: 9.8 mg/dL (ref 8.7–10.2)
Chloride: 99 mmol/L (ref 96–106)
Creatinine, Ser: 1.3 mg/dL — ABNORMAL HIGH (ref 0.76–1.27)
Globulin, Total: 3.3 g/dL (ref 1.5–4.5)
Glucose: 91 mg/dL (ref 70–99)
Potassium: 3.6 mmol/L (ref 3.5–5.2)
Sodium: 142 mmol/L (ref 134–144)
Total Protein: 7.7 g/dL (ref 6.0–8.5)
eGFR: 63 mL/min/{1.73_m2} (ref >59)

## 2022-09-10 LAB — CBC WITH DIFFERENTIAL/PLATELET
Basophils Absolute: 0.1 10*3/uL (ref 0.0–0.2)
Basos: 1 %
EOS (ABSOLUTE): 0.2 10*3/uL (ref 0.0–0.4)
Eos: 2 %
Hematocrit: 41.4 % (ref 37.5–51.0)
Hemoglobin: 13.6 g/dL (ref 13.0–17.7)
Immature Grans (Abs): 0 10*3/uL (ref 0.0–0.1)
Immature Granulocytes: 0 %
Lymphocytes Absolute: 2.2 10*3/uL (ref 0.7–3.1)
Lymphs: 23 %
MCH: 29.1 pg (ref 26.6–33.0)
MCHC: 32.9 g/dL (ref 31.5–35.7)
MCV: 89 fL (ref 79–97)
Monocytes Absolute: 0.8 10*3/uL (ref 0.1–0.9)
Monocytes: 8 %
Neutrophils Absolute: 6.1 10*3/uL (ref 1.4–7.0)
Neutrophils: 66 %
Platelets: 316 10*3/uL (ref 150–450)
RBC: 4.68 x10E6/uL (ref 4.14–5.80)
RDW: 14.2 % (ref 11.6–15.4)
WBC: 9.3 10*3/uL (ref 3.4–10.8)

## 2022-09-10 LAB — LIPID PANEL
Chol/HDL Ratio: 3.3 ratio (ref 0.0–5.0)
Cholesterol, Total: 130 mg/dL (ref 100–199)
HDL: 39 mg/dL — ABNORMAL LOW (ref >39)
LDL Chol Calc (NIH): 72 mg/dL (ref 0–99)
Triglycerides: 100 mg/dL (ref 0–149)
VLDL Cholesterol Cal: 19 mg/dL (ref 5–40)

## 2022-09-10 LAB — VITAMIN D 25 HYDROXY (VIT D DEFICIENCY, FRACTURES): Vit D, 25-Hydroxy: 52.7 ng/mL (ref 30.0–100.0)

## 2022-09-10 LAB — TSH+FREE T4
Free T4: 1.41 ng/dL (ref 0.82–1.77)
TSH: 1.1 u[IU]/mL (ref 0.450–4.500)

## 2022-09-10 LAB — HEMOGLOBIN A1C
Est. average glucose Bld gHb Est-mCnc: 117 mg/dL
Hgb A1c MFr Bld: 5.7 % — ABNORMAL HIGH (ref 4.8–5.6)

## 2022-09-16 NOTE — Progress Notes (Signed)
Please inform the patient that his labs indicate that he is prediabetic.  I recommend decreasing his intake of sugary foods and increasing his physical activities.  I also recommend increasing his fluid consumption by at least 64 ounces daily.  His kidneys, liver, thyroid, and vitamin D levels are stable

## 2022-09-17 ENCOUNTER — Other Ambulatory Visit: Payer: Self-pay | Admitting: Nurse Practitioner

## 2022-09-17 DIAGNOSIS — E782 Mixed hyperlipidemia: Secondary | ICD-10-CM

## 2022-11-06 ENCOUNTER — Other Ambulatory Visit: Payer: Self-pay | Admitting: Family Medicine

## 2022-11-06 DIAGNOSIS — I1310 Hypertensive heart and chronic kidney disease without heart failure, with stage 1 through stage 4 chronic kidney disease, or unspecified chronic kidney disease: Secondary | ICD-10-CM

## 2023-01-07 ENCOUNTER — Ambulatory Visit: Payer: Medicare HMO | Admitting: Family Medicine

## 2023-01-07 ENCOUNTER — Encounter: Payer: Self-pay | Admitting: Family Medicine

## 2023-01-22 ENCOUNTER — Ambulatory Visit (INDEPENDENT_AMBULATORY_CARE_PROVIDER_SITE_OTHER): Payer: Medicare HMO | Admitting: Family Medicine

## 2023-01-22 ENCOUNTER — Encounter: Payer: Self-pay | Admitting: Family Medicine

## 2023-01-22 VITALS — BP 134/82 | HR 56 | Ht 67.0 in | Wt 186.0 lb

## 2023-01-22 DIAGNOSIS — R7301 Impaired fasting glucose: Secondary | ICD-10-CM

## 2023-01-22 DIAGNOSIS — I63032 Cerebral infarction due to thrombosis of left carotid artery: Secondary | ICD-10-CM

## 2023-01-22 DIAGNOSIS — E785 Hyperlipidemia, unspecified: Secondary | ICD-10-CM | POA: Diagnosis not present

## 2023-01-22 DIAGNOSIS — E559 Vitamin D deficiency, unspecified: Secondary | ICD-10-CM | POA: Diagnosis not present

## 2023-01-22 DIAGNOSIS — I10 Essential (primary) hypertension: Secondary | ICD-10-CM

## 2023-01-22 DIAGNOSIS — E039 Hypothyroidism, unspecified: Secondary | ICD-10-CM | POA: Diagnosis not present

## 2023-01-22 NOTE — Progress Notes (Signed)
Established Patient Office Visit  Subjective:  Patient ID: Scott Ritter, male    DOB: 05/26/1961  Age: 62 y.o. MRN: [id number]  CC:  Chief Complaint  Patient presents with   Follow-up    4 month f/u.     HPI Scott VANDERHEIDE is a 62 y.o. male with past medical history of hypertension, hyperlipidemia, and CVA presents for f/u of  chronic medical conditions. For the details of today's visit, please refer to the assessment and plan.     Past Medical History:  Diagnosis Date   GERD (gastroesophageal reflux disease)    Hypertension    Stroke    left side deficits    Past Surgical History:  Procedure Laterality Date   BACK SURGERY     BIOPSY  09/17/2018   Procedure: BIOPSY;  Surgeon: West Bali, MD;  [facility];  Service: Endoscopy;;  duodenal biopsy    COLONOSCOPY N/A 02/29/2016   Dr. Darrick Penna: redundant colon, non-bleeding internal hemorrhoids    COLONOSCOPY WITH PROPOFOL N/A 04/09/2021   Procedure: COLONOSCOPY WITH PROPOFOL;  Surgeon: Lanelle Bal, DO;  [facility];  Service: Endoscopy;  Laterality: N/A;  7:30am   ESOPHAGOGASTRODUODENOSCOPY N/A 12/17/2016   Dr. Darrick Penna: 3 mm nonbleeding Mallory-Weiss tear.  EGD performed for hematemesis.  Hemoglobin normal.   ESOPHAGOGASTRODUODENOSCOPY N/A 02/19/2018   Dr. Jena Gauss: Performed for melena, hemoglobin of 6.  Mucosal changes in the esophagus query short segment Barrett's, biopsy more consistent with reflux changes.  Erosive gastropathy but no H. pylori.  Duodenal erosions.  Suspected NSAID induced injury.   ESOPHAGOGASTRODUODENOSCOPY  08/18/2018   Dr. Karilyn Cota: IDA/heme positive stool.  Esophageal mucosal changes consistent with short segment Barrett's esophagus, not biopsied.  2 cm hiatal hernia.  Duodenal bulb, second portion of duodenum, third portion of the duodenum.  Video capsule somewhat difficult to pass through the oropharynx but was eventually advanced into the second portion of the  duodenum and released.   ESOPHAGOGASTRODUODENOSCOPY N/A 08/18/2018   Procedure: ESOPHAGOGASTRODUODENOSCOPY (EGD);  Surgeon: Malissa Hippo, MD;  [facility];  Service: Endoscopy;  Laterality: N/A;   ESOPHAGOGASTRODUODENOSCOPY N/A 09/17/2018   gastritis, 2 nonbleeding angiectasia's in the duodenum were treated with [facility] coagulation status post biopsy.   FOOT SURGERY Left    GIVENS CAPSULE STUDY N/A 08/18/2018   Procedure: GIVENS CAPSULE STUDY;  Surgeon: Malissa Hippo, MD;  [facility];  Service: Endoscopy;  Laterality: N/A;   HERNIA REPAIR     POLYPECTOMY  04/09/2021   Procedure: POLYPECTOMY;  Surgeon: Lanelle Bal, DO;  [facility];  Service: Endoscopy;;  descending; cecal    Family History  Problem Relation Age of Onset   Hypertension Mother    Hypertension Father    Colon cancer Father    Colon polyps Neg Hx     Social History   Socioeconomic History   Marital status: Divorced    Spouse name: Not on file   Number of children: 2   Years of education: Not on file   Highest education level: Not on file  Occupational History   Not on file  Tobacco Use   Smoking status: Never   Smokeless tobacco: Never  Vaping Use   Vaping Use: Never used  Substance and Sexual Activity   Alcohol use: Yes    Alcohol/week: 1.0 standard drink of alcohol    Types: 1 Cans of beer per week    Comment: occasionally; once a  week or once a month   Drug use: No   Sexual activity: Yes    Comment: not married hs sexual partner  Other Topics Concern   Not on file  Social History Narrative   PT lives with his mother, he is on disability for his stroke.    Social Determinants of Health   Financial Resource Strain: Low Risk  (01/31/2021)   Overall Financial Resource Strain (CARDIA)    Difficulty of Paying Living Expenses: Not hard at all  Food Insecurity: No Food Insecurity (01/31/2021)   Hunger Vital Sign    Worried About Running Out of Food in the Last  Year: Never true    Ran Out of Food in the Last Year: Never true  Transportation Needs: No Transportation Needs (01/31/2021)   PRAPARE - Administrator, Civil Service (Medical): No    Lack of Transportation (Non-Medical): No  Physical Activity: Insufficiently Active (01/31/2021)   Exercise Vital Sign    Days of Exercise per Week: 3 days    Minutes of Exercise per Session: 30 min  Stress: No Stress Concern Present (01/31/2021)   [employer] of Occupational Health - Occupational Stress Questionnaire    Feeling of Stress : Not at all  Social Connections: [place] (01/31/2021)   Social Connection and Isolation Panel [NHANES]    Frequency of Communication with Friends and Family: More than three times a week    Frequency of Social Gatherings with Friends and Family: More than three times a week    Attends Religious Services: More than 4 times per year    Active Member of [employer] or Organizations: Yes    Attends Engineer, structural: More than 4 times per year    Marital Status: Married  Catering manager Violence: Not At Risk (01/31/2021)   Humiliation, Afraid, Rape, and Kick questionnaire    Fear of Current or Ex-Partner: No    Emotionally Abused: No    Physically Abused: No    Sexually Abused: No    Outpatient Medications Prior to Visit  Medication Sig Dispense Refill   acetaminophen (TYLENOL) 500 MG tablet Take 500 mg by mouth every 6 (six) hours as needed for moderate pain.     amLODipine (NORVASC) 10 MG tablet TAKE 1 TABLET EVERY DAY 90 tablet 10   aspirin EC 81 MG EC tablet Take 1 tablet (81 mg total) by mouth daily. Swallow whole. 30 tablet 11   atorvastatin (LIPITOR) 80 MG tablet TAKE 1 TABLET (80 MG TOTAL) BY MOUTH DAILY. 90 tablet 3   Cholecalciferol (VITAMIN D) 2000 units CAPS Take 2,000 Units by mouth daily.     ezetimibe (ZETIA) 10 MG tablet Take 1 tablet (10 mg total) by mouth daily. 90 tablet 3   gabapentin (NEURONTIN) 600 MG tablet Take  600mg  once daily for 2 days  Then 600mg  twice daily for 2 days  Then 600mg  three times daily 90 tablet 1   hydrALAZINE (APRESOLINE) 100 MG tablet TAKE 1 TABLET THREE TIMES DAILY 270 tablet 3   losartan-hydrochlorothiazide (HYZAAR) 100-25 MG tablet TAKE 1 TABLET EVERY DAY 90 tablet 10   metoprolol tartrate (LOPRESSOR) 25 MG tablet TAKE 1 TABLET TWICE DAILY 180 tablet 10   potassium chloride SA (KLOR-CON M) 20 MEQ tablet Take 1 tablet (20 mEq total) by mouth 2 (two) times daily. 180 tablet 0   No facility-administered medications prior to visit.    No Known Allergies  ROS Review of Systems  Constitutional:  Negative  for fatigue and fever.  Eyes:  Negative for visual disturbance.  Respiratory:  Negative for chest tightness and shortness of breath.   Cardiovascular:  Negative for chest pain and palpitations.  Neurological:  Negative for dizziness and headaches.      Objective:    Physical Exam HENT:     Head: Normocephalic.     Right Ear: External ear normal.     Left Ear: External ear normal.     Nose: No congestion or rhinorrhea.     Mouth/Throat:     Mouth: Mucous membranes are moist.  Cardiovascular:     Rate and Rhythm: Regular rhythm.     Heart sounds: No murmur heard. Pulmonary:     Effort: No respiratory distress.     Breath sounds: Normal breath sounds.  Neurological:     Mental Status: He is alert.     BP 134/82   Pulse (!) 56   Ht 5\' 7"  (1.702 m)   Wt 186 lb 0.6 oz (84.4 kg)   SpO2 96%   BMI 29.14 kg/m  Wt Readings from Last 3 Encounters:  01/22/23 186 lb 0.6 oz (84.4 kg)  09/08/22 186 lb 1.9 oz (84.4 kg)  04/30/22 184 lb 0.6 oz (83.5 kg)    Lab Results  Component Value Date   TSH 1.100 09/09/2022   Lab Results  Component Value Date   WBC 9.3 09/09/2022   HGB 13.6 09/09/2022   HCT 41.4 09/09/2022   MCV 89 09/09/2022   PLT 316 09/09/2022   Lab Results  Component Value Date   NA 142 09/09/2022   K 3.6 09/09/2022   CO2 23 09/09/2022    GLUCOSE 91 09/09/2022   BUN 16 09/09/2022   CREATININE 1.30 (H) 09/09/2022   BILITOT 0.5 09/09/2022   ALKPHOS 96 09/09/2022   AST 18 09/09/2022   ALT 8 09/09/2022   PROT 7.7 09/09/2022   ALBUMIN 4.4 09/09/2022   CALCIUM 9.8 09/09/2022   ANIONGAP 7 10/24/2021   EGFR 63 09/09/2022   Lab Results  Component Value Date   CHOL 130 09/09/2022   Lab Results  Component Value Date   HDL 39 (L) 09/09/2022   Lab Results  Component Value Date   LDLCALC 72 09/09/2022   Lab Results  Component Value Date   TRIG 100 09/09/2022   Lab Results  Component Value Date   CHOLHDL 3.3 09/09/2022   Lab Results  Component Value Date   HGBA1C 5.7 (H) 09/09/2022      Assessment & Plan:  Primary hypertension Assessment & Plan: Controlled Encouraged to continue amlodipine 10 mg,Losartan hydrochlorothiazide 100-25, metoprolol 25 mg, and hydralazine 100 mg 3 times daily Low-sodium diet with increased physical activity encouraged Pending CBC and CMP BP Readings from Last 3 Encounters:  01/22/23 134/82  09/08/22 (!) 142/88  04/30/22 138/68     Orders: -     CMP14+EGFR -     CBC with Differential/Platelet  Hyperlipidemia LDL goal <70 Assessment & Plan: Encouraged to continue taking atorvastatin 80 mg daily and ezetimibe 10 mg daily Will assess lipid panel today Lab Results  Component Value Date   CHOL 130 09/09/2022   HDL 39 (L) 09/09/2022   LDLCALC 72 09/09/2022   TRIG 100 09/09/2022   CHOLHDL 3.3 09/09/2022     Orders: -     Lipid panel  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   Scott LarocheGloria  Wolf Boulay, FNP

## 2023-01-22 NOTE — Patient Instructions (Addendum)
  I appreciate the opportunity to provide care to you today!    Follow up:  4 months  Labs: please stop by the lab today to get your blood drawn (CBC, CMP, TSH, Lipid profile, HgA1c, Vit D)  Please schedule Medicare Annual Wellness.   Please continue to a heart-healthy diet and increase your physical activities. Try to exercise for 30mins at least five days a week.      It was a pleasure to see you and I look forward to continuing to work together on your health and well-being. Please do not hesitate to call the office if you need care or have questions about your care.   Have a wonderful day and week. With Gratitude, Breylon Sherrow MSN, FNP-BC  

## 2023-01-22 NOTE — Assessment & Plan Note (Signed)
Controlled Encouraged to continue amlodipine 10 mg,Losartan hydrochlorothiazide 100-25, metoprolol 25 mg, and hydralazine 100 mg 3 times daily Low-sodium diet with increased physical activity encouraged Pending CBC and CMP BP Readings from Last 3 Encounters:  01/22/23 134/82  09/08/22 (!) 142/88  04/30/22 138/68

## 2023-01-22 NOTE — Assessment & Plan Note (Signed)
Encouraged to continue taking atorvastatin 80 mg daily and ezetimibe 10 mg daily Will assess lipid panel today Lab Results  Component Value Date   CHOL 130 09/09/2022   HDL 39 (L) 09/09/2022   LDLCALC 72 09/09/2022   TRIG 100 09/09/2022   CHOLHDL 3.3 09/09/2022

## 2023-01-22 NOTE — Assessment & Plan Note (Addendum)
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

## 2023-01-23 LAB — HEMOGLOBIN A1C
Est. average glucose Bld gHb Est-mCnc: 117 mg/dL
Hgb A1c MFr Bld: 5.7 % — ABNORMAL HIGH (ref 4.8–5.6)

## 2023-01-23 LAB — CBC WITH DIFFERENTIAL/PLATELET
Basophils Absolute: 0.1 10*3/uL (ref 0.0–0.2)
Basos: 1 %
EOS (ABSOLUTE): 0.2 10*3/uL (ref 0.0–0.4)
Eos: 2 %
Hematocrit: 41.8 % (ref 37.5–51.0)
Hemoglobin: 13.6 g/dL (ref 13.0–17.7)
Immature Grans (Abs): 0 10*3/uL (ref 0.0–0.1)
Immature Granulocytes: 0 %
Lymphocytes Absolute: 1.9 10*3/uL (ref 0.7–3.1)
Lymphs: 19 %
MCH: 27.9 pg (ref 26.6–33.0)
MCHC: 32.5 g/dL (ref 31.5–35.7)
MCV: 86 fL (ref 79–97)
Monocytes Absolute: 0.8 10*3/uL (ref 0.1–0.9)
Monocytes: 8 %
Neutrophils Absolute: 7 10*3/uL (ref 1.4–7.0)
Neutrophils: 70 %
Platelets: 328 10*3/uL (ref 150–450)
RBC: 4.87 x10E6/uL (ref 4.14–5.80)
RDW: 13.7 % (ref 11.6–15.4)
WBC: 10 10*3/uL (ref 3.4–10.8)

## 2023-01-23 LAB — CMP14+EGFR
ALT: 9 IU/L (ref 0–44)
AST: 21 IU/L (ref 0–40)
Albumin/Globulin Ratio: 1.3 (ref 1.2–2.2)
Albumin: 4.3 g/dL (ref 3.8–4.9)
Alkaline Phosphatase: 101 IU/L (ref 44–121)
BUN/Creatinine Ratio: 11 (ref 9–20)
BUN: 14 mg/dL (ref 6–24)
Bilirubin Total: 0.5 mg/dL (ref 0.0–1.2)
CO2: 22 mmol/L (ref 20–29)
Calcium: 9.6 mg/dL (ref 8.7–10.2)
Chloride: 103 mmol/L (ref 96–106)
Creatinine, Ser: 1.32 mg/dL — ABNORMAL HIGH (ref 0.76–1.27)
Globulin, Total: 3.3 g/dL (ref 1.5–4.5)
Glucose: 95 mg/dL (ref 70–99)
Potassium: 3.8 mmol/L (ref 3.5–5.2)
Sodium: 139 mmol/L (ref 134–144)
Total Protein: 7.6 g/dL (ref 6.0–8.5)
eGFR: 62 mL/min/{1.73_m2} (ref >59)

## 2023-01-23 LAB — LIPID PANEL
Chol/HDL Ratio: 3 ratio (ref 0.0–5.0)
Cholesterol, Total: 124 mg/dL (ref 100–199)
HDL: 41 mg/dL (ref >39)
LDL Chol Calc (NIH): 67 mg/dL (ref 0–99)
Triglycerides: 81 mg/dL (ref 0–149)
VLDL Cholesterol Cal: 16 mg/dL (ref 5–40)

## 2023-01-23 LAB — VITAMIN D 25 HYDROXY (VIT D DEFICIENCY, FRACTURES): Vit D, 25-Hydroxy: 48.7 ng/mL (ref 30.0–100.0)

## 2023-01-23 LAB — TSH+FREE T4
Free T4: 1.32 ng/dL (ref 0.82–1.77)
TSH: 0.852 u[IU]/mL (ref 0.450–4.500)

## 2023-02-04 NOTE — Progress Notes (Signed)
Your hemoglobin A1c is 5.7, this indicates that you are prediabetic. I recommend avoiding simple carbohydrates, including cakes, sweet desserts, ice cream, soda (diet or regular), sweet tea, candies, chips, cookies, store-bought juices, alcohol in excess of 1-2 drinks a day, lemonade, artificial sweeteners, donuts, coffee creamers, and sugar-free products.  I recommend avoiding greasy, fatty foods with increased physical activity.  All other labs are stable

## 2023-02-09 ENCOUNTER — Ambulatory Visit (INDEPENDENT_AMBULATORY_CARE_PROVIDER_SITE_OTHER): Payer: Medicare HMO | Admitting: Family Medicine

## 2023-02-09 DIAGNOSIS — Z0000 Encounter for general adult medical examination without abnormal findings: Secondary | ICD-10-CM

## 2023-02-09 DIAGNOSIS — Z0100 Encounter for examination of eyes and vision without abnormal findings: Secondary | ICD-10-CM

## 2023-02-09 NOTE — Progress Notes (Signed)
Subjective:   Scott Ritter is a 62 y.o. male who presents for Medicare Annual/Subsequent preventive examination.  I connected with  [name] on 02/09/23 by a audio enabled telemedicine application and verified that I am speaking with the correct person using two identifiers.  Patient Location: Home  Provider Location: Office/Clinic  I discussed the limitations of evaluation and management by telemedicine. The patient expressed understanding and agreed to proceed.   Review of Systems    Patient denies pain, fever, chills, chest pain, palpations ,shortness of breath, blurred vision,cough, abdominal pain, nausea, vomiting, headache, dizziness. Patient is not feeling nervous or anxious.         Objective:    There were no vitals filed for this visit. There is no height or weight on file to calculate BMI.     02/03/2022    8:03 AM 12/20/2021    8:16 AM 10/23/2021   12:00 PM 04/09/2021    6:57 AM 01/31/2021    8:24 AM 10/27/2020    1:18 PM 10/20/2019    9:07 AM  Advanced Directives  Does Patient Have a Medical Advance Directive? No No No No No No No  Would patient like information on creating a medical advance directive? Yes (ED - Information included in AVS) No - Patient declined No - Patient declined No - Patient declined No - Patient declined No - Patient declined Yes (MAU/Ambulatory/Procedural Areas - Information given)    Current Medications (verified) Outpatient Encounter Medications as of 02/09/2023  Medication Sig   acetaminophen (TYLENOL) 500 MG tablet Take 500 mg by mouth every 6 (six) hours as needed for moderate pain.   amLODipine (NORVASC) 10 MG tablet TAKE 1 TABLET EVERY DAY   aspirin EC 81 MG EC tablet Take 1 tablet (81 mg total) by mouth daily. Swallow whole.   atorvastatin (LIPITOR) 80 MG tablet TAKE 1 TABLET (80 MG TOTAL) BY MOUTH DAILY.   Cholecalciferol (VITAMIN D) 2000 units CAPS Take 2,000 Units by mouth daily.   ezetimibe (ZETIA) 10 MG tablet  Take 1 tablet (10 mg total) by mouth daily.   gabapentin (NEURONTIN) 600 MG tablet Take 600mg  once daily for 2 days  Then 600mg  twice daily for 2 days  Then 600mg  three times daily   hydrALAZINE (APRESOLINE) 100 MG tablet TAKE 1 TABLET THREE TIMES DAILY   losartan-hydrochlorothiazide (HYZAAR) 100-25 MG tablet TAKE 1 TABLET EVERY DAY   metoprolol tartrate (LOPRESSOR) 25 MG tablet TAKE 1 TABLET TWICE DAILY   potassium chloride SA (KLOR-CON M) 20 MEQ tablet Take 1 tablet (20 mEq total) by mouth 2 (two) times daily.   No facility-administered encounter medications on file as of 02/09/2023.    Allergies (verified) Patient has no known allergies.   History: Past Medical History:  Diagnosis Date   GERD (gastroesophageal reflux disease)    Hypertension    Stroke [facility])    left side deficits   Past Surgical History:  Procedure Laterality Date   BACK SURGERY     BIOPSY  09/17/2018   Procedure: BIOPSY;  Surgeon: West Bali, MD;  [facility];  Service: Endoscopy;;  duodenal biopsy    COLONOSCOPY N/A 02/29/2016   Dr. Darrick Penna: redundant colon, non-bleeding internal hemorrhoids    COLONOSCOPY WITH PROPOFOL N/A 04/09/2021   Procedure: COLONOSCOPY WITH PROPOFOL;  Surgeon: Lanelle Bal, DO;  [facility];  Service: Endoscopy;  Laterality: N/A;  7:30am   ESOPHAGOGASTRODUODENOSCOPY N/A 12/17/2016   Dr. Darrick Penna: 3 mm  nonbleeding Mallory-Weiss tear.  EGD performed for hematemesis.  Hemoglobin normal.   ESOPHAGOGASTRODUODENOSCOPY N/A 02/19/2018   Dr. Jena Gauss: Performed for melena, hemoglobin of 6.  Mucosal changes in the esophagus query short segment Barrett's, biopsy more consistent with reflux changes.  Erosive gastropathy but no H. pylori.  Duodenal erosions.  Suspected NSAID induced injury.   ESOPHAGOGASTRODUODENOSCOPY  08/18/2018   Dr. Karilyn Cota: IDA/heme positive stool.  Esophageal mucosal changes consistent with short segment Barrett's esophagus, not biopsied.  2 cm  hiatal hernia.  Duodenal bulb, second portion of duodenum, third portion of the duodenum.  Video capsule somewhat difficult to pass through the oropharynx but was eventually advanced into the second portion of the duodenum and released.   ESOPHAGOGASTRODUODENOSCOPY N/A 08/18/2018   Procedure: ESOPHAGOGASTRODUODENOSCOPY (EGD);  Surgeon: Malissa Hippo, MD;  [facility];  Service: Endoscopy;  Laterality: N/A;   ESOPHAGOGASTRODUODENOSCOPY N/A 09/17/2018   gastritis, 2 nonbleeding angiectasia's in the duodenum were treated with [facility] coagulation status post biopsy.   FOOT SURGERY Left    GIVENS CAPSULE STUDY N/A 08/18/2018   Procedure: GIVENS CAPSULE STUDY;  Surgeon: Malissa Hippo, MD;  [facility];  Service: Endoscopy;  Laterality: N/A;   HERNIA REPAIR     POLYPECTOMY  04/09/2021   Procedure: POLYPECTOMY;  Surgeon: Lanelle Bal, DO;  [facility];  Service: Endoscopy;;  descending; cecal   Family History  Problem Relation Age of Onset   Hypertension Mother    Hypertension Father    Colon cancer Father    Colon polyps Neg Hx    Social History   Socioeconomic History   Marital status: Divorced    Spouse name: Not on file   Number of children: 2   Years of education: Not on file   Highest education level: Not on file  Occupational History   Not on file  Tobacco Use   Smoking status: Never   Smokeless tobacco: Never  Vaping Use   Vaping Use: Never used  Substance and Sexual Activity   Alcohol use: Yes    Alcohol/week: 1.0 standard drink of alcohol    Types: 1 Cans of beer per week    Comment: occasionally; once a week or once a month   Drug use: No   Sexual activity: Yes    Comment: not married hs sexual partner  Other Topics Concern   Not on file  Social History Narrative   PT lives with his mother, he is on disability for his stroke.    Social Determinants of Health   Financial Resource Strain: Low Risk  (01/31/2021)   Overall  Financial Resource Strain (CARDIA)    Difficulty of Paying Living Expenses: Not hard at all  Food Insecurity: No Food Insecurity (01/31/2021)   Hunger Vital Sign    Worried About Running Out of Food in the Last Year: Never true    Ran Out of Food in the Last Year: Never true  Transportation Needs: No Transportation Needs (01/31/2021)   PRAPARE - Administrator, Civil Service (Medical): No    Lack of Transportation (Non-Medical): No  Physical Activity: Insufficiently Active (01/31/2021)   Exercise Vital Sign    Days of Exercise per Week: 3 days    Minutes of Exercise per Session: 30 min  Stress: No Stress Concern Present (01/31/2021)   [employer] of Occupational Health - Occupational Stress Questionnaire    Feeling of Stress : Not at all  Social Connections: Socially Integrated (  01/31/2021)   Social Connection and Isolation Panel [NHANES]    Frequency of Communication with Friends and Family: More than three times a week    Frequency of Social Gatherings with Friends and Family: More than three times a week    Attends Religious Services: More than 4 times per year    Active Member of [employer] or Organizations: Yes    Attends Engineer, structural: More than 4 times per year    Marital Status: Married    Tobacco Counseling Counseling given: Not Answered   Clinical Intake:                 Diabetic? No Hemoglobin A1c 5.7         Activities of Daily Living     No data to display          Patient Care Team: Gilmore Laroche, FNP as PCP - General (Family Medicine) West Bali, MD (Inactive) as Consulting Physician (Gastroenterology) Randa Lynn, MD as Consulting Physician (Nephrology)  Indicate any recent Medical Services you may have received from other than [facility] providers in the past year (date may be approximate).     Assessment:   This is a routine wellness examination for Scott Ritter.  Hearing/Vision screen No results  found.  Dietary issues and exercise activities discussed:   Discussed lifestyle modifications follow diet low in saturated fat, reduce dietary salt intake, avoid fatty foods, maintain an exercise routine 3 to 5 days a week for a minimum total of 150 minutes.   Goals Addressed   None    Depression Screen    02/09/2023    8:10 AM 01/22/2023    8:45 AM 09/08/2022   10:58 AM 04/30/2022   11:40 AM 02/03/2022    8:04 AM 01/29/2022   11:15 AM 12/10/2021    8:06 AM  PHQ 2/9 Scores  PHQ - 2 Score 0 0 0 0 0 0 0  PHQ- 9 Score 0 0 0        Fall Risk    02/09/2023    8:10 AM 01/22/2023    8:44 AM 09/08/2022   10:58 AM 04/30/2022   11:40 AM 02/03/2022    8:04 AM  Fall Risk   Falls in the past year? 0 0 0 0 0  Number falls in past yr: 0 0 0 0 0  Injury with Fall? 0 0 0 0 0  Risk for fall due to : No Fall Risks No Fall Risks No Fall Risks No Fall Risks No Fall Risks  Follow up Falls evaluation completed Falls evaluation completed Falls evaluation completed Falls evaluation completed Falls evaluation completed    FALL RISK PREVENTION PERTAINING TO THE HOME:  Any stairs in or around the home? Yes  If so, are there any without handrails? No  Home free of loose throw rugs in walkways, pet beds, electrical cords, etc? Yes  Adequate lighting in your home to reduce risk of falls? Yes   ASSISTIVE DEVICES UTILIZED TO PREVENT FALLS:  Life alert? No  Use of a cane, walker or w/c? Yes  Grab bars in the bathroom? Yes  Shower chair or bench in shower? No  Elevated toilet seat or a handicapped toilet? No     Cognitive Function:        02/03/2022    8:06 AM 10/20/2019    9:16 AM  6CIT Screen  What Year? 0 points 0 points  What month? 0 points 0 points  What time?  0 points 0 points  Count back from 20 0 points 0 points  Months in reverse 2 points 4 points  Repeat phrase 0 points 4 points  Total Score 2 points 8 points    Immunizations Immunization History  Administered Date(s)  Administered   Influenza,inj,Quad PF,6+ Mos 12/17/2016, 08/24/2019, 08/07/2020, 09/16/2021   Janssen (J&J) SARS-COV-2 Vaccination 01/23/2020   Moderna Covid-19 Vaccine Bivalent Booster 82yrs & up 03/20/2021   Moderna SARS-COV2 Booster Vaccination 09/11/2020, 09/02/2021   Moderna Sars-Covid-2 Vaccination 07/24/2020   Pneumococcal Conjugate-13 08/16/2018   Tdap 09/22/2022   Zoster Recombinat (Shingrix) 10/31/2021, 01/15/2022    TDAP status: Up to date  Flu Vaccine status: Due, Education has been provided regarding the importance of this vaccine. Advised may receive this vaccine at local pharmacy or Health Dept. Aware to provide a copy of the vaccination record if obtained from local pharmacy or Health Dept. Verbalized acceptance and understanding.  Pneumococcal vaccine status: Due, Education has been provided regarding the importance of this vaccine. Advised may receive this vaccine at local pharmacy or Health Dept. Aware to provide a copy of the vaccination record if obtained from local pharmacy or Health Dept. Verbalized acceptance and understanding.  Covid-19 vaccine status: Information provided on how to obtain vaccines.   Qualifies for Shingles Vaccine? Yes   Zostavax completed Yes   Shingrix Completed?: Yes  Screening Tests Health Maintenance  Topic Date Due   COVID-19 Vaccine (6 - 2023-24 season) 06/13/2022   Medicare Annual Wellness (AWV)  02/04/2023   INFLUENZA VACCINE  05/14/2023   COLONOSCOPY (Pts 45-44yrs Insurance coverage will need to be confirmed)  04/10/2031   DTaP/Tdap/Td (2 - Td or Tdap) 09/22/2032   Hepatitis C Screening  Completed   HIV Screening  Completed   Zoster Vaccines- Shingrix  Completed   HPV VACCINES  Aged Out    Health Maintenance  Health Maintenance Due  Topic Date Due   COVID-19 Vaccine (6 - 2023-24 season) 06/13/2022   Medicare Annual Wellness (AWV)  02/04/2023    Colorectal cancer screening: Type of screening: Colonoscopy. Completed 2022.  Repeat every 10 years  Lung Cancer Screening: (Low Dose CT Chest recommended if Age 25-80 years, 30 pack-year currently smoking OR have quit w/in 15years.) does not qualify.   Lung Cancer Screening Referral: N/A  Additional Screening:  Hepatitis C Screening: does not qualify; Completed 2021 Negative  Vision Screening: Recommended annual ophthalmology exams for early detection of glaucoma and other disorders of the eye. Is the patient up to date with their annual eye exam?  No  Who is the provider or what is the name of the office in which the patient attends annual eye exams? Patient does not have one If pt is not established with a provider, would they like to be referred to a provider to establish care? Yes .   Dental Screening: Recommended annual dental exams for proper oral hygiene  Community Resource Referral / Chronic Care Management: CRR required this visit?  No   CCM required this visit?  No      Plan:     I have personally reviewed and noted the following in the patient's chart:   Medical and social history Use of alcohol, tobacco or illicit drugs  Current medications and supplements including opioid prescriptions. Patient is not currently taking opioid prescriptions. Functional ability and status Nutritional status Physical activity Advanced directives List of other physicians Hospitalizations, surgeries, and ER visits in previous 12 months Vitals Screenings to include cognitive, depression, and  falls Referrals and appointments  In addition, I have reviewed and discussed with patient certain preventive protocols, quality metrics, and best practice recommendations. A written personalized care plan for preventive services as well as general preventive health recommendations were provided to patient.     [address] [place], [place]   02/09/2023

## 2023-03-31 ENCOUNTER — Telehealth: Payer: Self-pay | Admitting: Family Medicine

## 2023-03-31 NOTE — Telephone Encounter (Signed)
Patient came by the office asking if provider will type up a letter saying:    [place] disable move the mailbox to the house this is for him and his mother. Needs to take the letter to the postoffice. Concern about the steps.   Patient patient when ready for pick up call back # [phone]

## 2023-03-31 NOTE — Telephone Encounter (Signed)
Pt would need an appt to discuss this with Malachi Bonds.

## 2023-04-01 NOTE — Telephone Encounter (Signed)
scheduled

## 2023-04-15 ENCOUNTER — Encounter: Payer: Self-pay | Admitting: Family Medicine

## 2023-04-15 ENCOUNTER — Ambulatory Visit (INDEPENDENT_AMBULATORY_CARE_PROVIDER_SITE_OTHER): Payer: Medicare HMO | Admitting: Family Medicine

## 2023-04-15 VITALS — BP 128/71 | HR 58 | Ht 67.0 in | Wt 182.0 lb

## 2023-04-15 DIAGNOSIS — I10 Essential (primary) hypertension: Secondary | ICD-10-CM | POA: Diagnosis not present

## 2023-04-15 NOTE — Patient Instructions (Addendum)
I appreciate the opportunity to provide care to you today!    Follow up:  05/25/2023      Please continue to a heart-healthy diet and increase your physical activities. Try to exercise for at least five days a week.      It was a pleasure to see you and I look forward to continuing to work together on your health and well-being. Please do not hesitate to call the office if you need care or have questions about your care.   Have a wonderful day and week. With Gratitude, Gilmore Laroche MSN, FNP-BC

## 2023-04-15 NOTE — Progress Notes (Signed)
Established Patient Office Visit  Subjective:  Patient ID: MD. HOOS, male    DOB: [birth year]  Age: 62 y.o. MRN: [id number]  CC:  Chief Complaint  Patient presents with   Medication Problem    Pt would like to discuss about getting a letter typed from pcp saying he needs a mailbox on his porch rather than having to walk to the side of the road, letter addressed to post office.     HPI Scott Ritter is a 62 y.o. male with past medical history of HTN, Stroke and hypokalemia presents for a medial letter drom his pcp.    Past Medical History:  Diagnosis Date   GERD (gastroesophageal reflux disease)    Hypertension    Stroke [facility])    left side deficits    Past Surgical History:  Procedure Laterality Date   BACK SURGERY     BIOPSY  09/17/2018   Procedure: BIOPSY;  Surgeon: West Bali, MD;  [facility];  Service: Endoscopy;;  duodenal biopsy    COLONOSCOPY N/A 02/29/2016   Dr. Darrick Penna: redundant colon, non-bleeding internal hemorrhoids    COLONOSCOPY WITH PROPOFOL N/A 04/09/2021   Procedure: COLONOSCOPY WITH PROPOFOL;  Surgeon: Lanelle Bal, DO;  [facility];  Service: Endoscopy;  Laterality: N/A;  7:30am   ESOPHAGOGASTRODUODENOSCOPY N/A 12/17/2016   Dr. Darrick Penna: 3 mm nonbleeding Mallory-Weiss tear.  EGD performed for hematemesis.  Hemoglobin normal.   ESOPHAGOGASTRODUODENOSCOPY N/A 02/19/2018   Dr. Jena Gauss: Performed for melena, hemoglobin of 6.  Mucosal changes in the esophagus query short segment Barrett's, biopsy more consistent with reflux changes.  Erosive gastropathy but no H. pylori.  Duodenal erosions.  Suspected NSAID induced injury.   ESOPHAGOGASTRODUODENOSCOPY  08/18/2018   Dr. Karilyn Cota: IDA/heme positive stool.  Esophageal mucosal changes consistent with short segment Barrett's esophagus, not biopsied.  2 cm hiatal hernia.  Duodenal bulb, second portion of duodenum, third portion of the duodenum.  Video capsule somewhat  difficult to pass through the oropharynx but was eventually advanced into the second portion of the duodenum and released.   ESOPHAGOGASTRODUODENOSCOPY N/A 08/18/2018   Procedure: ESOPHAGOGASTRODUODENOSCOPY (EGD);  Surgeon: Malissa Hippo, MD;  [facility];  Service: Endoscopy;  Laterality: N/A;   ESOPHAGOGASTRODUODENOSCOPY N/A 09/17/2018   gastritis, 2 nonbleeding angiectasia's in the duodenum were treated with [facility] coagulation status post biopsy.   FOOT SURGERY Left    GIVENS CAPSULE STUDY N/A 08/18/2018   Procedure: GIVENS CAPSULE STUDY;  Surgeon: Malissa Hippo, MD;  [facility];  Service: Endoscopy;  Laterality: N/A;   HERNIA REPAIR     POLYPECTOMY  04/09/2021   Procedure: POLYPECTOMY;  Surgeon: Lanelle Bal, DO;  [facility];  Service: Endoscopy;;  descending; cecal    Family History  Problem Relation Age of Onset   Hypertension Mother    Hypertension Father    Colon cancer Father    Colon polyps Neg Hx     Social History   Socioeconomic History   Marital status: Divorced    Spouse name: Not on file   Number of children: 2   Years of education: Not on file   Highest education level: Not on file  Occupational History   Not on file  Tobacco Use   Smoking status: Never   Smokeless tobacco: Never  Vaping Use   Vaping Use: Never used  Substance and Sexual Activity   Alcohol use: Yes    Alcohol/week: 1.0  standard drink of alcohol    Types: 1 Cans of beer per week    Comment: occasionally; once a week or once a month   Drug use: No   Sexual activity: Yes    Comment: not married hs sexual partner  Other Topics Concern   Not on file  Social History Narrative   PT lives with his mother, he is on disability for his stroke.    Social Determinants of Health   Financial Resource Strain: Low Risk  (01/31/2021)   Overall Financial Resource Strain (CARDIA)    Difficulty of Paying Living Expenses: Not hard at all  Food Insecurity:  No Food Insecurity (01/31/2021)   Hunger Vital Sign    Worried About Running Out of Food in the Last Year: Never true    Ran Out of Food in the Last Year: Never true  Transportation Needs: No Transportation Needs (01/31/2021)   PRAPARE - Administrator, Civil Service (Medical): No    Lack of Transportation (Non-Medical): No  Physical Activity: Insufficiently Active (01/31/2021)   Exercise Vital Sign    Days of Exercise per Week: 3 days    Minutes of Exercise per Session: 30 min  Stress: No Stress Concern Present (01/31/2021)   [employer] of Occupational Health - Occupational Stress Questionnaire    Feeling of Stress : Not at all  Social Connections: Socially Integrated (01/31/2021)   Social Connection and Isolation Panel [NHANES]    Frequency of Communication with Friends and Family: More than three times a week    Frequency of Social Gatherings with Friends and Family: More than three times a week    Attends Religious Services: More than 4 times per year    Active Member of [employer] or Organizations: Yes    Attends Engineer, structural: More than 4 times per year    Marital Status: Married  Catering manager Violence: Not At Risk (01/31/2021)   Humiliation, Afraid, Rape, and Kick questionnaire    Fear of Current or Ex-Partner: No    Emotionally Abused: No    Physically Abused: No    Sexually Abused: No    Outpatient Medications Prior to Visit  Medication Sig Dispense Refill   acetaminophen (TYLENOL) 500 MG tablet Take 500 mg by mouth every 6 (six) hours as needed for moderate pain.     amLODipine (NORVASC) 10 MG tablet TAKE 1 TABLET EVERY DAY 90 tablet 10   aspirin EC 81 MG EC tablet Take 1 tablet (81 mg total) by mouth daily. Swallow whole. 30 tablet 11   atorvastatin (LIPITOR) 80 MG tablet TAKE 1 TABLET (80 MG TOTAL) BY MOUTH DAILY. 90 tablet 3   Cholecalciferol (VITAMIN D) 2000 units CAPS Take 2,000 Units by mouth daily.     ezetimibe (ZETIA) 10 MG tablet  Take 1 tablet (10 mg total) by mouth daily. 90 tablet 3   gabapentin (NEURONTIN) 600 MG tablet Take 600mg  once daily for 2 days  Then 600mg  twice daily for 2 days  Then 600mg  three times daily 90 tablet 1   hydrALAZINE (APRESOLINE) 100 MG tablet TAKE 1 TABLET THREE TIMES DAILY 270 tablet 3   losartan-hydrochlorothiazide (HYZAAR) 100-25 MG tablet TAKE 1 TABLET EVERY DAY 90 tablet 10   metoprolol tartrate (LOPRESSOR) 25 MG tablet TAKE 1 TABLET TWICE DAILY 180 tablet 10   potassium chloride SA (KLOR-CON M) 20 MEQ tablet Take 1 tablet (20 mEq total) by mouth 2 (two) times daily. 180 tablet 0  No facility-administered medications prior to visit.    No Known Allergies  ROS Review of Systems  Constitutional:  Negative for fatigue and fever.  Eyes:  Negative for visual disturbance.  Respiratory:  Negative for chest tightness and shortness of breath.   Cardiovascular:  Negative for chest pain and palpitations.  Neurological:  Negative for dizziness and headaches.      Objective:    Physical Exam HENT:     Head: Normocephalic.     Right Ear: External ear normal.     Left Ear: External ear normal.     Nose: No congestion or rhinorrhea.     Mouth/Throat:     Mouth: Mucous membranes are moist.  Cardiovascular:     Rate and Rhythm: Regular rhythm.     Heart sounds: No murmur heard. Pulmonary:     Effort: No respiratory distress.     Breath sounds: Normal breath sounds.  Neurological:     Mental Status: He is alert.     BP 128/71   Pulse (!) 58   Ht 5\' 7"  (1.702 m)   Wt 182 lb 0.6 oz (82.6 kg)   SpO2 96%   BMI 28.51 kg/m  Wt Readings from Last 3 Encounters:  04/15/23 182 lb 0.6 oz (82.6 kg)  01/22/23 186 lb 0.6 oz (84.4 kg)  09/08/22 186 lb 1.9 oz (84.4 kg)    Lab Results  Component Value Date   TSH 0.852 01/22/2023   Lab Results  Component Value Date   WBC 10.0 01/22/2023   HGB 13.6 01/22/2023   HCT 41.8 01/22/2023   MCV 86 01/22/2023   PLT 328 01/22/2023    Lab Results  Component Value Date   NA 139 01/22/2023   K 3.8 01/22/2023   CO2 22 01/22/2023   GLUCOSE 95 01/22/2023   BUN 14 01/22/2023   CREATININE 1.32 (H) 01/22/2023   BILITOT 0.5 01/22/2023   ALKPHOS 101 01/22/2023   AST 21 01/22/2023   ALT 9 01/22/2023   PROT 7.6 01/22/2023   ALBUMIN 4.3 01/22/2023   CALCIUM 9.6 01/22/2023   ANIONGAP 7 10/24/2021   EGFR 62 01/22/2023   Lab Results  Component Value Date   CHOL 124 01/22/2023   Lab Results  Component Value Date   HDL 41 01/22/2023   Lab Results  Component Value Date   LDLCALC 67 01/22/2023   Lab Results  Component Value Date   TRIG 81 01/22/2023   Lab Results  Component Value Date   CHOLHDL 3.0 01/22/2023   Lab Results  Component Value Date   HGBA1C 5.7 (H) 01/22/2023      Assessment & Plan:  Primary hypertension Assessment & Plan: Medical letter provided     Follow-up: No follow-ups on file.   Gilmore Laroche, FNP

## 2023-04-15 NOTE — Assessment & Plan Note (Signed)
Letter provided

## 2023-04-15 NOTE — Assessment & Plan Note (Signed)
Medical letter provided

## 2023-05-07 ENCOUNTER — Telehealth: Payer: Medicare HMO | Admitting: Family Medicine

## 2023-05-07 NOTE — Telephone Encounter (Signed)
Selle, called from [facility] enrolled for chronic condition special needs plan to continue to stay enroll with [facility] needs to know if patient is still diagnosis with diabetes and / or cardiologist disease.[employer] call back [phone] reference # [id number]

## 2023-05-08 NOTE — Telephone Encounter (Signed)
Reference number [id number] spoke with Selena Batten to provide dx information.

## 2023-05-25 ENCOUNTER — Encounter: Payer: Self-pay | Admitting: Family Medicine

## 2023-05-25 ENCOUNTER — Ambulatory Visit (INDEPENDENT_AMBULATORY_CARE_PROVIDER_SITE_OTHER): Payer: Medicare HMO | Admitting: Family Medicine

## 2023-05-25 VITALS — BP 136/69 | HR 52 | Ht 67.0 in | Wt 181.1 lb

## 2023-05-25 DIAGNOSIS — M79672 Pain in left foot: Secondary | ICD-10-CM | POA: Diagnosis not present

## 2023-05-25 DIAGNOSIS — I10 Essential (primary) hypertension: Secondary | ICD-10-CM

## 2023-05-25 DIAGNOSIS — E785 Hyperlipidemia, unspecified: Secondary | ICD-10-CM

## 2023-05-25 DIAGNOSIS — R7301 Impaired fasting glucose: Secondary | ICD-10-CM

## 2023-05-25 DIAGNOSIS — G8929 Other chronic pain: Secondary | ICD-10-CM | POA: Diagnosis not present

## 2023-05-25 DIAGNOSIS — E038 Other specified hypothyroidism: Secondary | ICD-10-CM

## 2023-05-25 DIAGNOSIS — E559 Vitamin D deficiency, unspecified: Secondary | ICD-10-CM | POA: Diagnosis not present

## 2023-05-25 DIAGNOSIS — E7849 Other hyperlipidemia: Secondary | ICD-10-CM | POA: Diagnosis not present

## 2023-05-25 MED ORDER — GABAPENTIN 300 MG PO CAPS
300.0000 mg | ORAL_CAPSULE | Freq: Two times a day (BID) | ORAL | 3 refills | Status: DC
Start: 2023-05-25 — End: 2024-03-18

## 2023-05-25 NOTE — Assessment & Plan Note (Addendum)
Encouraged to continue taking atorvastatin 80 mg daily and ezetimibe 10 mg daily Encourage a heart healthy diet with increased physical activity Will assess lipid panel today Lab Results  Component Value Date   CHOL 124 01/22/2023   HDL 41 01/22/2023   LDLCALC 67 01/22/2023   TRIG 81 01/22/2023   CHOLHDL 3.0 01/22/2023

## 2023-05-25 NOTE — Assessment & Plan Note (Signed)
Controlled He takes losartan hydrochlorothiazide 100-25 mg, amlodipine 10 mg daily, hydralazine 100 mg 3 times daily, metoprolol 25 mg twice daily Asymptomatic in the clinic Encouraged low-sodium diet with increased physical activity BP Readings from Last 3 Encounters:  05/25/23 136/69  04/15/23 128/71  01/22/23 134/82

## 2023-05-25 NOTE — Progress Notes (Signed)
Established Patient Office Visit  Subjective:  Patient ID: Scott Ritter, male    DOB: 21-Sep-1961  Age: 62 y.o. MRN: [id number]  CC:  Chief Complaint  Patient presents with   Care Management    4 month f/u   Foot Problem    Pt reports left foot pain onset 3 months ago, pain in on the bottom of his foot would like a referral to podiatry ([facility] [place])    HPI Scott Ritter is a 62 y.o. male with past medical history of hypertension, hyperlipidemia, and chronic left foot pain presents for f/u of  chronic medical conditions. For the details of today's visit, please refer to the assessment and plan.     Past Medical History:  Diagnosis Date   GERD (gastroesophageal reflux disease)    Hypertension    Stroke [facility])    left side deficits    Past Surgical History:  Procedure Laterality Date   BACK SURGERY     BIOPSY  09/17/2018   Procedure: BIOPSY;  Surgeon: West Bali, MD;  [facility];  Service: Endoscopy;;  duodenal biopsy    COLONOSCOPY N/A 02/29/2016   Dr. Darrick Penna: redundant colon, non-bleeding internal hemorrhoids    COLONOSCOPY WITH PROPOFOL N/A 04/09/2021   Procedure: COLONOSCOPY WITH PROPOFOL;  Surgeon: Lanelle Bal, DO;  Location: [facility];  Service: Endoscopy;  Laterality: N/A;  7:30am   ESOPHAGOGASTRODUODENOSCOPY N/A 12/17/2016   Dr. Darrick Penna: 3 mm nonbleeding Mallory-Weiss tear.  EGD performed for hematemesis.  Hemoglobin normal.   ESOPHAGOGASTRODUODENOSCOPY N/A 02/19/2018   Dr. Jena Gauss: Performed for melena, hemoglobin of 6.  Mucosal changes in the esophagus query short segment Barrett's, biopsy more consistent with reflux changes.  Erosive gastropathy but no H. pylori.  Duodenal erosions.  Suspected NSAID induced injury.   ESOPHAGOGASTRODUODENOSCOPY  08/18/2018   Dr. Karilyn Cota: IDA/heme positive stool.  Esophageal mucosal changes consistent with short segment Barrett's esophagus, not biopsied.  2 cm hiatal hernia.  Duodenal  bulb, second portion of duodenum, third portion of the duodenum.  Video capsule somewhat difficult to pass through the oropharynx but was eventually advanced into the second portion of the duodenum and released.   ESOPHAGOGASTRODUODENOSCOPY N/A 08/18/2018   Procedure: ESOPHAGOGASTRODUODENOSCOPY (EGD);  Surgeon: Malissa Hippo, MD;  [facility];  Service: Endoscopy;  Laterality: N/A;   ESOPHAGOGASTRODUODENOSCOPY N/A 09/17/2018   gastritis, 2 nonbleeding angiectasia's in the duodenum were treated with [facility] coagulation status post biopsy.   FOOT SURGERY Left    GIVENS CAPSULE STUDY N/A 08/18/2018   Procedure: GIVENS CAPSULE STUDY;  Surgeon: Malissa Hippo, MD;  Location: [facility];  Service: Endoscopy;  Laterality: N/A;   HERNIA REPAIR     POLYPECTOMY  04/09/2021   Procedure: POLYPECTOMY;  Surgeon: Lanelle Bal, DO;  [facility]: [facility];  Service: Endoscopy;;  descending; cecal    Family History  Problem Relation Age of Onset   Hypertension Mother    Hypertension Father    Colon cancer Father    Colon polyps Neg Hx     Social History   Socioeconomic History   Marital status: Divorced    Spouse name: Not on file   Number of children: 2   Years of education: Not on file   Highest education level: Not on file  Occupational History   Not on file  Tobacco Use   Smoking status: Never   Smokeless tobacco: Never  Vaping Use   Vaping  status: Never Used  Substance and Sexual Activity   Alcohol use: Yes    Alcohol/week: 1.0 standard drink of alcohol    Types: 1 Cans of beer per week    Comment: occasionally; once a week or once a month   Drug use: No   Sexual activity: Yes    Comment: not married hs sexual partner  Other Topics Concern   Not on file  Social History Narrative   PT lives with his mother, he is on disability for his stroke.    Social Determinants of Health   Financial Resource Strain: Low Risk  (01/31/2021)   Overall Financial Resource  Strain (CARDIA)    Difficulty of Paying Living Expenses: Not hard at all  Food Insecurity: No Food Insecurity (01/31/2021)   Hunger Vital Sign    Worried About Running Out of Food in the Last Year: Never true    Ran Out of Food in the Last Year: Never true  Transportation Needs: No Transportation Needs (01/31/2021)   PRAPARE - Administrator, Civil Service (Medical): No    Lack of Transportation (Non-Medical): No  Physical Activity: Insufficiently Active (01/31/2021)   Exercise Vital Sign    Days of Exercise per Week: 3 days    Minutes of Exercise per Session: 30 min  Stress: No Stress Concern Present (01/31/2021)   [employer] of Occupational Health - Occupational Stress Questionnaire    Feeling of Stress : Not at all  Social Connections: Socially Integrated (01/31/2021)   Social Connection and Isolation Panel [NHANES]    Frequency of Communication with Friends and Family: More than three times a week    Frequency of Social Gatherings with Friends and Family: More than three times a week    Attends Religious Services: More than 4 times per year    Active Member of [employer] or Organizations: Yes    Attends Engineer, structural: More than 4 times per year    Marital Status: Married  Catering manager Violence: Not At Risk (01/31/2021)   Humiliation, Afraid, Rape, and Kick questionnaire    Fear of Current or Ex-Partner: No    Emotionally Abused: No    Physically Abused: No    Sexually Abused: No    Outpatient Medications Prior to Visit  Medication Sig Dispense Refill   acetaminophen (TYLENOL) 500 MG tablet Take 500 mg by mouth every 6 (six) hours as needed for moderate pain.     amLODipine (NORVASC) 10 MG tablet TAKE 1 TABLET EVERY DAY 90 tablet 10   aspirin EC 81 MG EC tablet Take 1 tablet (81 mg total) by mouth daily. Swallow whole. 30 tablet 11   atorvastatin (LIPITOR) 80 MG tablet TAKE 1 TABLET (80 MG TOTAL) BY MOUTH DAILY. 90 tablet 3   Cholecalciferol  (VITAMIN D) 2000 units CAPS Take 2,000 Units by mouth daily.     ezetimibe (ZETIA) 10 MG tablet Take 1 tablet (10 mg total) by mouth daily. 90 tablet 3   hydrALAZINE (APRESOLINE) 100 MG tablet TAKE 1 TABLET THREE TIMES DAILY 270 tablet 3   losartan-hydrochlorothiazide (HYZAAR) 100-25 MG tablet TAKE 1 TABLET EVERY DAY 90 tablet 10   metoprolol tartrate (LOPRESSOR) 25 MG tablet TAKE 1 TABLET TWICE DAILY 180 tablet 10   potassium chloride SA (KLOR-CON M) 20 MEQ tablet Take 1 tablet (20 mEq total) by mouth 2 (two) times daily. 180 tablet 0   gabapentin (NEURONTIN) 600 MG tablet Take 600mg  once daily for 2 days  Then 600mg  twice daily for 2 days  Then 600mg  three times daily 90 tablet 1   No facility-administered medications prior to visit.    No Known Allergies  ROS Review of Systems  Constitutional:  Negative for fatigue and fever.  Eyes:  Negative for visual disturbance.  Respiratory:  Negative for chest tightness and shortness of breath.   Cardiovascular:  Negative for chest pain and palpitations.  Musculoskeletal:        Chronic left foot pain  Neurological:  Negative for dizziness and headaches.      Objective:    Physical Exam HENT:     Head: Normocephalic.     Right Ear: External ear normal.     Left Ear: External ear normal.     Nose: No congestion or rhinorrhea.     Mouth/Throat:     Mouth: Mucous membranes are moist.  Cardiovascular:     Rate and Rhythm: Regular rhythm.     Heart sounds: No murmur heard. Pulmonary:     Effort: No respiratory distress.     Breath sounds: Normal breath sounds.  Musculoskeletal:     Left foot: Normal range of motion. No deformity or foot drop.  Neurological:     Mental Status: He is alert.     BP 136/69   Pulse (!) 52   Ht 5\' 7"  (1.702 m)   Wt 181 lb 1.9 oz (82.2 kg)   SpO2 96%   BMI 28.37 kg/m  Wt Readings from Last 3 Encounters:  05/25/23 181 lb 1.9 oz (82.2 kg)  04/15/23 182 lb 0.6 oz (82.6 kg)  01/22/23 186 lb 0.6 oz  (84.4 kg)    Lab Results  Component Value Date   TSH 0.852 01/22/2023   Lab Results  Component Value Date   WBC 10.0 01/22/2023   HGB 13.6 01/22/2023   HCT 41.8 01/22/2023   MCV 86 01/22/2023   PLT 328 01/22/2023   Lab Results  Component Value Date   NA 139 01/22/2023   K 3.8 01/22/2023   CO2 22 01/22/2023   GLUCOSE 95 01/22/2023   BUN 14 01/22/2023   CREATININE 1.32 (H) 01/22/2023   BILITOT 0.5 01/22/2023   ALKPHOS 101 01/22/2023   AST 21 01/22/2023   ALT 9 01/22/2023   PROT 7.6 01/22/2023   ALBUMIN 4.3 01/22/2023   CALCIUM 9.6 01/22/2023   ANIONGAP 7 10/24/2021   EGFR 62 01/22/2023   Lab Results  Component Value Date   CHOL 124 01/22/2023   Lab Results  Component Value Date   HDL 41 01/22/2023   Lab Results  Component Value Date   LDLCALC 67 01/22/2023   Lab Results  Component Value Date   TRIG 81 01/22/2023   Lab Results  Component Value Date   CHOLHDL 3.0 01/22/2023   Lab Results  Component Value Date   HGBA1C 5.7 (H) 01/22/2023      Assessment & Plan:  Primary hypertension Assessment & Plan: Controlled He takes losartan hydrochlorothiazide 100-25 mg, amlodipine 10 mg daily, hydralazine 100 mg 3 times daily, metoprolol 25 mg twice daily Asymptomatic in the clinic Encouraged low-sodium diet with increased physical activity BP Readings from Last 3 Encounters:  05/25/23 136/69  04/15/23 128/71  01/22/23 134/82      Hyperlipidemia LDL goal <70 Assessment & Plan: Encouraged to continue taking atorvastatin 80 mg daily and ezetimibe 10 mg daily Encourage a heart healthy diet with increased physical activity Will assess lipid panel today Lab Results  Component  Value Date   CHOL 124 01/22/2023   HDL 41 01/22/2023   LDLCALC 67 01/22/2023   TRIG 81 01/22/2023   CHOLHDL 3.0 01/22/2023      Chronic foot pain, left Assessment & Plan: Chronic left foot pain Pain is described as throbbing with occasional numbness and tingling  Pain is  constant  Pain is rated 10 out of 10 He has had MRI of the left foot which showed no fracture, dislocation, and bone abnormalities No significant degenerative changes were noted No swelling, redness, or tenderness noted Complains of pain at the bottom of his foot that radiates to his knees Will treat with gabapentin 300 mg to take twice daily Referral placed to podiatry Reviewed nonpharmacological management of foot pain  Orders: -     Ambulatory referral to Podiatry -     Gabapentin; Take 1 capsule (300 mg total) by mouth 2 (two) times daily.  Dispense: 90 capsule; Refill: 3  IFG (impaired fasting glucose) -     Hemoglobin A1c  Vitamin D deficiency -     VITAMIN D 25 Hydroxy (Vit-D Deficiency, Fractures)  Other specified hypothyroidism -     TSH + free T4  Other hyperlipidemia -     Lipid panel -     CMP14+EGFR -     CBC with Differential/Platelet   Note: This chart has been completed using Engineer, civil (consulting) software, and while attempts have been made to ensure accuracy, certain words and phrases may not be transcribed as intended.   Follow-up: Return in about 4 months (around 09/24/2023).   Gilmore Laroche, FNP

## 2023-05-25 NOTE — Assessment & Plan Note (Signed)
Chronic left foot pain Pain is described as throbbing with occasional numbness and tingling  Pain is constant  Pain is rated 10 out of 10 He has had MRI of the left foot which showed no fracture, dislocation, and bone abnormalities No significant degenerative changes were noted No swelling, redness, or tenderness noted Complains of pain at the bottom of his foot that radiates to his knees Will treat with gabapentin 300 mg to take twice daily Referral placed to podiatry Reviewed nonpharmacological management of foot pain

## 2023-05-25 NOTE — Patient Instructions (Signed)
I appreciate the opportunity to provide care to you today!    Follow up:  4 months  Labs: please stop by the lab today to get your blood drawn (CBC, CMP, TSH, Lipid profile, HgA1c, Vit D)  Non-pharmacological interventions for foot pain focus on reducing discomfort, improving mobility, and preventing further issues. Here are some effective strategies:  Rest and Activity Modification: Rest: Give your feet time to rest and recover, especially if the pain is due to overuse or injury. Avoid High-Impact Activities: Reduce activities that put excessive strain on your feet, such as running or jumping. Opt for low-impact exercises like swimming or cycling. Footwear and Orthotics: Supportive Shoes: Wear shoes with good arch support, cushioning, and a proper fit. Custom Orthotics: Consider using custom orthotic inserts to provide additional support and reduce pressure on painful areas. Stretching and Strengthening Exercises: Foot and Calf Stretches: Regularly stretch your feet, Achilles tendons, and calf muscles to improve flexibility and reduce tension. Strengthening Exercises: Strengthen the muscles of your feet and lower legs with exercises like toe curls, picking up objects with your toes, or using a resistance band for foot exercises. Ice and Heat Therapy: Ice Therapy: Apply ice packs to the affected area for 15-20 minutes several times a day to reduce inflammation and numb pain. Heat Therapy: Use a warm towel or heating pad to relax muscles and improve circulation if the pain is related to muscle tightness or stiffness. Massage: Foot Massage: Gently massaging the foot can help relieve tension, improve circulation, and reduce pain. Tennis [employer]: Roll your foot over a tennis ball or foam roller to massage the plantar fascia and relieve pain. Weight Management: Healthy Weight: Maintaining a healthy weight reduces the load on your feet, helping to prevent and alleviate pain. Proper Foot  Care: Keep Feet Clean and Dry: Good foot hygiene can prevent infections and other issues that could cause pain. Nail and Skin Care: Regularly trim toenails and moisturize your feet to prevent issues like ingrown toenails and cracked skin.  Elevation: Elevate Feet: Elevate your feet above heart level when resting to reduce swelling and improve blood circulation. Hydration: Stay Hydrated: Drinking enough water helps maintain good circulation and can reduce muscle cramps that may contribute to foot pain    Referral: Podiatry    Please continue to a heart-healthy diet and increase your physical activities. Try to exercise for at least five days a week.    It was a pleasure to see you and I look forward to continuing to work together on your health and well-being. Please do not hesitate to call the office if you need care or have questions about your care.  In case of emergency, please visit the Emergency Department for urgent care, or contact our clinic at [phone] to schedule an appointment. We're here to help you!   Have a wonderful day and week. With Gratitude, Gilmore Laroche MSN, FNP-BC

## 2023-06-01 ENCOUNTER — Other Ambulatory Visit: Payer: Self-pay | Admitting: Family Medicine

## 2023-06-01 DIAGNOSIS — E785 Hyperlipidemia, unspecified: Secondary | ICD-10-CM

## 2023-06-01 DIAGNOSIS — E876 Hypokalemia: Secondary | ICD-10-CM

## 2023-06-01 MED ORDER — POTASSIUM CHLORIDE CRYS ER 20 MEQ PO TBCR
20.0000 meq | EXTENDED_RELEASE_TABLET | Freq: Two times a day (BID) | ORAL | 0 refills | Status: DC
Start: 2023-06-01 — End: 2023-09-03

## 2023-06-01 MED ORDER — REPATHA SURECLICK 140 MG/ML ~~LOC~~ SOAJ: 140.0000 mg | SUBCUTANEOUS | 2 refills | Status: DC

## 2023-06-01 NOTE — Progress Notes (Signed)
Please inform the patient that his cholesterol levels have not yet reached the target. I recommend that he continue taking atorvastatin 80 mg daily and ezetimibe 10 mg daily. Additionally, a prescription for Repatha 140 mg subcutaneous injection every 14 days has been sent to his pharmacy.  A referral has been placed to [facility] for further management of his cholesterol. I also suggest reducing his intake of greasy, fatty, and starchy foods while increasing physical activity.  Furthermore, his potassium level is slightly low, and a refill for his potassium supplement has been sent to his pharmacy to resume therapy.

## 2023-06-17 ENCOUNTER — Encounter: Payer: Self-pay | Admitting: Podiatry

## 2023-06-17 ENCOUNTER — Ambulatory Visit (INDEPENDENT_AMBULATORY_CARE_PROVIDER_SITE_OTHER): Payer: Medicare HMO

## 2023-06-17 ENCOUNTER — Ambulatory Visit: Payer: Medicare HMO | Admitting: Podiatry

## 2023-06-17 DIAGNOSIS — M79672 Pain in left foot: Secondary | ICD-10-CM | POA: Diagnosis not present

## 2023-06-17 DIAGNOSIS — M7752 Other enthesopathy of left foot: Secondary | ICD-10-CM | POA: Diagnosis not present

## 2023-06-17 MED ORDER — TRIAMCINOLONE ACETONIDE 10 MG/ML IJ SUSP
10.0000 mg | Freq: Once | INTRAMUSCULAR | Status: AC
Start: 2023-06-17 — End: 2023-06-17
  Administered 2023-06-17: 10 mg via INTRA_ARTICULAR

## 2023-06-18 NOTE — Progress Notes (Signed)
Subjective:   Patient ID: Scott Ritter, male   DOB: 62 y.o.   MRN: [id number]   HPI Patient states he developed a lot of pain around his forefoot over the last 6 months does not remember specific injury and it has gotten gradually worse   ROS      Objective:  Physical Exam  Neurovascular status intact with inflammation around the second MPJ left fluid buildup around the joint surface painful when pressed     Assessment:  Inflammatory capsulitis of the second MPJ left fluid buildup     Plan:  H&P reviewed and at this point I did do a periarticular injection around the joint 3 mg dexamethasone Kenalog 5 mg Xylocaine advised on rigid bottom shoes and if symptoms persist we will possibly do more aggressive type procedure  X-rays were negative for signs of fracture or bony condition associated with this

## 2023-08-19 ENCOUNTER — Other Ambulatory Visit: Payer: Self-pay

## 2023-08-19 ENCOUNTER — Encounter (HOSPITAL_COMMUNITY): Payer: Self-pay | Admitting: Radiology

## 2023-08-19 ENCOUNTER — Emergency Department (HOSPITAL_COMMUNITY): Payer: Medicare HMO

## 2023-08-19 ENCOUNTER — Emergency Department (HOSPITAL_COMMUNITY)
Admission: EM | Admit: 2023-08-19 | Discharge: 2023-08-19 | Disposition: A | Discharge status: Home / Self Care | Payer: Medicare HMO | Attending: Emergency Medicine | Admitting: Emergency Medicine

## 2023-08-19 DIAGNOSIS — Z7982 Long term (current) use of aspirin: Secondary | ICD-10-CM | POA: Insufficient documentation

## 2023-08-19 DIAGNOSIS — Z79899 Other long term (current) drug therapy: Secondary | ICD-10-CM | POA: Insufficient documentation

## 2023-08-19 DIAGNOSIS — I10 Essential (primary) hypertension: Secondary | ICD-10-CM | POA: Insufficient documentation

## 2023-08-19 DIAGNOSIS — M19031 Primary osteoarthritis, right wrist: Secondary | ICD-10-CM | POA: Diagnosis not present

## 2023-08-19 DIAGNOSIS — M25531 Pain in right wrist: Secondary | ICD-10-CM | POA: Insufficient documentation

## 2023-08-19 MED ORDER — COLCHICINE 0.6 MG PO TABS
0.6000 mg | ORAL_TABLET | Freq: Two times a day (BID) | ORAL | 0 refills | Status: DC
Start: 2023-08-19 — End: 2024-01-25

## 2023-08-19 MED ORDER — COLCHICINE 0.6 MG PO TABS
1.2000 mg | ORAL_TABLET | ORAL | Status: AC
  Administered 2023-08-19: 1.2 mg via ORAL
  Filled 2023-08-19: qty 2

## 2023-08-19 NOTE — Discharge Instructions (Signed)
Your x-ray was reassuring, no signs of abnormal bones That may be related to a gout type inflammation Continue to take colchicine twice a day, no longer than 7 days, if your pain goes away you can stop taking it You should see your doctor within 2 to 3 days for recheck but I want you to come back to the ER for severe swelling pain redness or fever

## 2023-08-19 NOTE — ED Triage Notes (Signed)
Pt endorses right wrist pain that came on suddenly 3 days ago. Pt states no injury and hasn't pulled on anything.Pt states no swelling. Pain severe enough he is not bale to rest at night now. + pulse.

## 2023-08-19 NOTE — ED Provider Notes (Signed)
[facility] EMERGENCY DEPARTMENT AT [facility] Provider Note   CSN: [id number] Arrival date & time: 08/19/23  1324     History  Chief Complaint  Patient presents with   Wrist Pain    Scott Ritter is a 62 y.o. male.   Wrist Pain   This patient is a 62 year old male, denies a history of gout, has a history of hypertension and high cholesterol.  He presents with about 3 days of symptoms including right wrist pain which is worse with range of motion and over the last 3 days it is worsened, he denies that there is any objective swelling or redness and he has had no fevers, he denies any injury or trauma, he has no other joint problems and has never had gout.  He has had a prior stroke which is left him with left arm weakness and slight left leg weakness, he is usually able to walk without a cane and only uses it for long distances.  He has not taken any specific medications for pain prior to arrival today.  He denies any recent surgery or any surgery at all on that arm.  He denies any shoulder or elbow discomfort and no discomfort in the legs.  He has no chest pain or shortness of breath    Home Medications Prior to Admission medications   Medication Sig Start Date End Date Taking? Authorizing Provider  colchicine 0.6 MG tablet Take 1 tablet (0.6 mg total) by mouth 2 (two) times daily for 7 days. 08/19/23 08/26/23 Yes Eber Hong, MD  acetaminophen (TYLENOL) 500 MG tablet Take 500 mg by mouth every 6 (six) hours as needed for moderate pain.    [provider]  amLODipine (NORVASC) 10 MG tablet TAKE 1 TABLET EVERY DAY 08/19/22   Gilmore Laroche, FNP  aspirin EC 81 MG EC tablet Take 1 tablet (81 mg total) by mouth daily. Swallow whole. 10/25/21   Sherryll Burger, Pratik D, DO  atorvastatin (LIPITOR) 80 MG tablet TAKE 1 TABLET (80 MG TOTAL) BY MOUTH DAILY. 09/18/22   Gilmore Laroche, FNP  Cholecalciferol (VITAMIN D) 2000 units CAPS Take 2,000 Units by mouth daily.    [provider]  Evolocumab (REPATHA SURECLICK) 140 MG/ML SOAJ Inject 140 mg into the skin every 14 (fourteen) days. 06/01/23   Gilmore Laroche, FNP  ezetimibe (ZETIA) 10 MG tablet Take 1 tablet (10 mg total) by mouth daily. 12/11/21   Paseda, Baird Kay, FNP  gabapentin (NEURONTIN) 300 MG capsule Take 1 capsule (300 mg total) by mouth 2 (two) times daily. 05/25/23   Gilmore Laroche, FNP  hydrALAZINE (APRESOLINE) 100 MG tablet TAKE 1 TABLET THREE TIMES DAILY 11/06/22   Gilmore Laroche, FNP  losartan-hydrochlorothiazide (HYZAAR) 100-25 MG tablet TAKE 1 TABLET EVERY DAY 08/19/22   Gilmore Laroche, FNP  metoprolol tartrate (LOPRESSOR) 25 MG tablet TAKE 1 TABLET TWICE DAILY 08/19/22   Gilmore Laroche, FNP  potassium chloride SA (KLOR-CON M) 20 MEQ tablet Take 1 tablet (20 mEq total) by mouth 2 (two) times daily. 06/01/23   Gilmore Laroche, FNP      Allergies    Patient has no known allergies.    Review of Systems   Review of Systems  All other systems reviewed and are negative.   Physical Exam Updated Vital Signs BP (!) 161/81 (BP Location: Right Arm)   Pulse (!) 52   Temp 98.8 F (37.1 C) (Oral)   Resp 18   Ht 1.702 m (5\' 7" )  Wt 82.6 kg   SpO2 99%   BMI 28.51 kg/m  Physical Exam Vitals and nursing note reviewed.  Constitutional:      General: He is not in acute distress.    Appearance: He is well-developed.  HENT:     Head: Normocephalic and atraumatic.     Comments: No trismus or torticollis, he has generally poor dentition with mild tenderness along his gingiva, no focal purulent collections, no fluctuance, no facial swelling    Mouth/Throat:     Pharynx: No oropharyngeal exudate.  Eyes:     General: No scleral icterus.       Right eye: No discharge.        Left eye: No discharge.     Conjunctiva/sclera: Conjunctivae normal.     Pupils: Pupils are equal, round, and reactive to light.  Neck:     Thyroid: No thyromegaly.     Vascular: No JVD.  Cardiovascular:     Rate and  Rhythm: Normal rate and regular rhythm.     Heart sounds: Normal heart sounds. No murmur heard.    No friction rub. No gallop.  Pulmonary:     Effort: Pulmonary effort is normal. No respiratory distress.     Breath sounds: Normal breath sounds. No wheezing or rales.  Musculoskeletal:        General: No tenderness. Normal range of motion.     Cervical back: Normal range of motion and neck supple.     Comments: The patient has some weakness of the left arm and left leg, right arm and leg are normal in strength, he is able to ambulate without difficulty, his right wrist has tenderness with range of motion but no obvious swelling no redness no warmth normal pulses and normal ability to flex and extend the fingers without difficulty.  No other joint swelling or tenderness  Lymphadenopathy:     Cervical: No cervical adenopathy.  Skin:    General: Skin is warm and dry.     Findings: No erythema or rash.  Neurological:     Mental Status: He is alert.     Coordination: Coordination normal.     Comments: Left arm weakness  Psychiatric:        Behavior: Behavior normal.     ED Results / Procedures / Treatments   Labs (all labs ordered are listed, but only abnormal results are displayed) Labs Reviewed - No data to display  EKG None  Radiology DG Wrist Complete Right  Result Date: 08/19/2023 CLINICAL DATA:  Pain. EXAM: RIGHT WRIST - COMPLETE 3+ VIEW COMPARISON:  None Available. FINDINGS: There is no evidence of fracture or dislocation. The carpal rows are intact, and demonstrate normal alignment. The joint spaces are preserved. Mild degenerative changes at the triscaphe articulation. No significant soft tissue abnormalities are seen. IMPRESSION: No acute osseous abnormality. Electronically Signed   By: Hart Robinsons M.D.   On: 08/19/2023 13:08    Procedures Procedures    Medications Ordered in ED Medications  colchicine tablet 1.2 mg (1.2 mg Oral Given 08/19/23 1149)    ED Course/  Medical Decision Making/ A&P                                 Medical Decision Making Amount and/or Complexity of Data Reviewed Radiology: ordered.  Risk Prescription drug management.    This patient presents to the ED for concern of wrist pain differential diagnosis  includes gout, acute arthropathy or arthritis, doubt septic joint, possible trauma Poor dentition, no obvious abscess but likely would benefit from an antibiotic to treat dental infections    Additional history obtained:  Additional history obtained from medical record External records from outside source obtained and reviewed including multiple visits with family doctor for hypertension hyperlipidemia, no recent admissions to the hospital   Lab Tests:  I Ordered, and personally interpreted labs.  The pertinent results include: Not indicated   Imaging Studies ordered:  I ordered imaging studies including x-ray of the right wrist I independently visualized and interpreted imaging which showed some arthritis but no obvious fractures or dislocations I agree with the radiologist interpretation   Medicines ordered and prescription drug management:  I ordered medication including anti-inflammatories, colchicine, antibiotics for home, colchicine given here Reevaluation of the patient after these medicines showed that the patient no significant changes I have reviewed the patients home medicines and have made adjustments as needed   Problem List / ED Course:  Likely has an acute arthropathy of the right wrist, doubt septic joint, will receive antibiotic for dental pain and gingival discomfort and colchicine for possible gout or inflammatory arthritis   Social Determinants of Health:  Prior stroke  Has good follow-up             Final Clinical Impression(s) / ED Diagnoses Final diagnoses:  Right wrist pain    Rx / DC Orders ED Discharge Orders          Ordered    colchicine 0.6 MG tablet  2  times daily        08/19/23 1355              Eber Hong, MD 08/19/23 1356

## 2023-09-03 ENCOUNTER — Telehealth: Payer: Self-pay

## 2023-09-03 ENCOUNTER — Other Ambulatory Visit: Payer: Self-pay | Admitting: Family Medicine

## 2023-09-03 DIAGNOSIS — E876 Hypokalemia: Secondary | ICD-10-CM

## 2023-09-03 NOTE — Telephone Encounter (Signed)
Transition Care Management Unsuccessful Follow-up Telephone Call  Date of discharge and from where:  08/19/2023 [facility]  Attempts:  1st Attempt  Reason for unsuccessful TCM follow-up call:  No answer/busy  Jayce Kainz [facility]  [facility] Resource Care Guide Direct Dial: [phone]  Website: [email or web address]

## 2023-09-24 ENCOUNTER — Encounter: Payer: Self-pay | Admitting: Family Medicine

## 2023-09-24 ENCOUNTER — Ambulatory Visit (INDEPENDENT_AMBULATORY_CARE_PROVIDER_SITE_OTHER): Payer: Medicare HMO | Admitting: Family Medicine

## 2023-09-24 VITALS — BP 138/82 | HR 61 | Wt 185.1 lb

## 2023-09-24 DIAGNOSIS — R7301 Impaired fasting glucose: Secondary | ICD-10-CM

## 2023-09-24 DIAGNOSIS — G8929 Other chronic pain: Secondary | ICD-10-CM | POA: Diagnosis not present

## 2023-09-24 DIAGNOSIS — M79672 Pain in left foot: Secondary | ICD-10-CM | POA: Diagnosis not present

## 2023-09-24 DIAGNOSIS — E785 Hyperlipidemia, unspecified: Secondary | ICD-10-CM

## 2023-09-24 DIAGNOSIS — E038 Other specified hypothyroidism: Secondary | ICD-10-CM

## 2023-09-24 DIAGNOSIS — E559 Vitamin D deficiency, unspecified: Secondary | ICD-10-CM

## 2023-09-24 DIAGNOSIS — I10 Essential (primary) hypertension: Secondary | ICD-10-CM

## 2023-09-24 NOTE — Progress Notes (Signed)
Established Patient Office Visit  Subjective:  Patient ID: Scott Ritter, male    DOB: 06-26-1961  Age: 62 y.o. MRN: [id number]  CC:  Chief Complaint  Patient presents with   Care Management    Follow up appt, reports foot pain on left foot radiating up to his knee.     HPI Scott Ritter is a 62 y.o. male with past medical history of hypertension, hyperlipidemia, and chronic left foot pain presents for f/u of  chronic medical conditions. For the details of today's visit, please refer to the assessment and plan.     Past Medical History:  Diagnosis Date   GERD (gastroesophageal reflux disease)    Hypertension    Stroke [facility])    left side deficits    Past Surgical History:  Procedure Laterality Date   BACK SURGERY     BIOPSY  09/17/2018   Procedure: BIOPSY;  Surgeon: West Bali, MD;  Location: [facility]O SUITE;  Service: Endoscopy;;  duodenal biopsy    COLONOSCOPY N/A 02/29/2016   Dr. Darrick Penna: redundant colon, non-bleeding internal hemorrhoids    COLONOSCOPY WITH PROPOFOL N/A 04/09/2021   Procedure: COLONOSCOPY WITH PROPOFOL;  Surgeon: Lanelle Bal, DO;  Location: [facility];  Service: Endoscopy;  Laterality: N/A;  7:30am   ESOPHAGOGASTRODUODENOSCOPY N/A 12/17/2016   Dr. Darrick Penna: 3 mm nonbleeding Mallory-Weiss tear.  EGD performed for hematemesis.  Hemoglobin normal.   ESOPHAGOGASTRODUODENOSCOPY N/A 02/19/2018   Dr. Jena Gauss: Performed for melena, hemoglobin of 6.  Mucosal changes in the esophagus query short segment Barrett's, biopsy more consistent with reflux changes.  Erosive gastropathy but no H. pylori.  Duodenal erosions.  Suspected NSAID induced injury.   ESOPHAGOGASTRODUODENOSCOPY  08/18/2018   Dr. Karilyn Cota: IDA/heme positive stool.  Esophageal mucosal changes consistent with short segment Barrett's esophagus, not biopsied.  2 cm hiatal hernia.  Duodenal bulb, second portion of duodenum, third portion of the duodenum.  Video capsule somewhat difficult to  pass through the oropharynx but was eventually advanced into the second portion of the duodenum and released.   ESOPHAGOGASTRODUODENOSCOPY N/A 08/18/2018   Procedure: ESOPHAGOGASTRODUODENOSCOPY (EGD);  Surgeon: Malissa Hippo, MD;  Location: [facility];  Service: Endoscopy;  Laterality: N/A;   ESOPHAGOGASTRODUODENOSCOPY N/A 09/17/2018   gastritis, 2 nonbleeding angiectasia's in the duodenum were treated with [facility] coagulation status post biopsy.   FOOT SURGERY Left    GIVENS CAPSULE STUDY N/A 08/18/2018   Procedure: GIVENS CAPSULE STUDY;  Surgeon: Malissa Hippo, MD;  Location: [facility];  Service: Endoscopy;  Laterality: N/A;   HERNIA REPAIR     POLYPECTOMY  04/09/2021   Procedure: POLYPECTOMY;  Surgeon: Lanelle Bal, DO;  Location: [facility];  Service: Endoscopy;;  descending; cecal    Family History  Problem Relation Age of Onset   Hypertension Mother    Hypertension Father    Colon cancer Father    Colon polyps Neg Hx     Social History   Socioeconomic History   Marital status: Divorced    Spouse name: Not on file   Number of children: 2   Years of education: Not on file   Highest education level: Not on file  Occupational History   Not on file  Tobacco Use   Smoking status: Never   Smokeless tobacco: Never  Vaping Use   Vaping status: Never Used  Substance and Sexual Activity   Alcohol use: Yes    Alcohol/week: 1.0 standard drink of alcohol  Types: 1 Cans of beer per week    Comment: occasionally; once a week or once a month   Drug use: No   Sexual activity: Yes    Comment: not married hs sexual partner  Other Topics Concern   Not on file  Social History Narrative   PT lives with his mother, he is on disability for his stroke.    Social Drivers of Corporate investment banker Strain: Low Risk  (01/31/2021)   Overall Financial Resource Strain (CARDIA)    Difficulty of Paying Living Expenses: Not hard at all  Food Insecurity: No Food  Insecurity (01/31/2021)   Hunger Vital Sign    Worried About Running Out of Food in the Last Year: Never true    Ran Out of Food in the Last Year: Never true  Transportation Needs: No Transportation Needs (01/31/2021)   PRAPARE - Administrator, Civil Service (Medical): No    Lack of Transportation (Non-Medical): No  Physical Activity: Insufficiently Active (01/31/2021)   Exercise Vital Sign    Days of Exercise per Week: 3 days    Minutes of Exercise per Session: 30 min  Stress: No Stress Concern Present (01/31/2021)   [employer] of Occupational Health - Occupational Stress Questionnaire    Feeling of Stress : Not at all  Social Connections: Socially Integrated (01/31/2021)   Social Connection and Isolation Panel [NHANES]    Frequency of Communication with Friends and Family: More than three times a week    Frequency of Social Gatherings with Friends and Family: More than three times a week    Attends Religious Services: More than 4 times per year    Active Member of [employer] or Organizations: Yes    Attends Engineer, structural: More than 4 times per year    Marital Status: Married  Catering manager Violence: Not At Risk (01/31/2021)   Humiliation, Afraid, Rape, and Kick questionnaire    Fear of Current or Ex-Partner: No    Emotionally Abused: No    Physically Abused: No    Sexually Abused: No    Outpatient Medications Prior to Visit  Medication Sig Dispense Refill   acetaminophen (TYLENOL) 500 MG tablet Take 500 mg by mouth every 6 (six) hours as needed for moderate pain.     amLODipine (NORVASC) 10 MG tablet TAKE 1 TABLET EVERY DAY 90 tablet 10   aspirin EC 81 MG EC tablet Take 1 tablet (81 mg total) by mouth daily. Swallow whole. 30 tablet 11   atorvastatin (LIPITOR) 80 MG tablet TAKE 1 TABLET (80 MG TOTAL) BY MOUTH DAILY. 90 tablet 3   Cholecalciferol (VITAMIN D) 2000 units CAPS Take 2,000 Units by mouth daily.     Evolocumab (REPATHA SURECLICK) 140 MG/ML  SOAJ Inject 140 mg into the skin every 14 (fourteen) days. 2 mL 2   ezetimibe (ZETIA) 10 MG tablet Take 1 tablet (10 mg total) by mouth daily. 90 tablet 3   gabapentin (NEURONTIN) 300 MG capsule Take 1 capsule (300 mg total) by mouth 2 (two) times daily. 90 capsule 3   hydrALAZINE (APRESOLINE) 100 MG tablet TAKE 1 TABLET THREE TIMES DAILY 270 tablet 3   losartan-hydrochlorothiazide (HYZAAR) 100-25 MG tablet TAKE 1 TABLET EVERY DAY 90 tablet 10   metoprolol tartrate (LOPRESSOR) 25 MG tablet TAKE 1 TABLET TWICE DAILY 180 tablet 10   potassium chloride SA (KLOR-CON M) 20 MEQ tablet TAKE 1 TABLET TWICE DAILY 180 tablet 3   colchicine 0.6  MG tablet Take 1 tablet (0.6 mg total) by mouth 2 (two) times daily for 7 days. 14 tablet 0   No facility-administered medications prior to visit.    No Known Allergies  ROS Review of Systems  Constitutional:  Negative for fatigue and fever.  Eyes:  Negative for visual disturbance.  Respiratory:  Negative for chest tightness and shortness of breath.   Cardiovascular:  Negative for chest pain and palpitations.  Musculoskeletal:        Chronic left foot pain  Neurological:  Negative for dizziness and headaches.      Objective:    Physical Exam HENT:     Head: Normocephalic.     Right Ear: External ear normal.     Left Ear: External ear normal.     Nose: No congestion or rhinorrhea.     Mouth/Throat:     Mouth: Mucous membranes are moist.  Cardiovascular:     Rate and Rhythm: Regular rhythm.     Heart sounds: No murmur heard. Pulmonary:     Effort: No respiratory distress.     Breath sounds: Normal breath sounds.  Neurological:     Mental Status: He is alert.     BP 138/82   Pulse 61   Wt 185 lb 1.3 oz (84 kg)   SpO2 96%   BMI 28.99 kg/m  Wt Readings from Last 3 Encounters:  09/24/23 185 lb 1.3 oz (84 kg)  08/19/23 182 lb (82.6 kg)  05/25/23 181 lb 1.9 oz (82.2 kg)    Lab Results  Component Value Date   TSH 1.170 09/24/2023    Lab Results  Component Value Date   WBC 10.2 09/24/2023   HGB 13.9 09/24/2023   HCT 42.7 09/24/2023   MCV 89 09/24/2023   PLT 345 09/24/2023   Lab Results  Component Value Date   NA 139 09/24/2023   K 3.6 09/24/2023   CO2 21 09/24/2023   GLUCOSE 101 (H) 09/24/2023   BUN 18 09/24/2023   CREATININE 1.18 09/24/2023   BILITOT 0.4 09/24/2023   ALKPHOS 90 09/24/2023   AST 23 09/24/2023   ALT 10 09/24/2023   PROT 7.6 09/24/2023   ALBUMIN 4.4 09/24/2023   CALCIUM 10.2 09/24/2023   ANIONGAP 7 10/24/2021   EGFR 71 09/24/2023   Lab Results  Component Value Date   CHOL 132 09/24/2023   Lab Results  Component Value Date   HDL 41 09/24/2023   Lab Results  Component Value Date   LDLCALC 73 09/24/2023   Lab Results  Component Value Date   TRIG 97 09/24/2023   Lab Results  Component Value Date   CHOLHDL 3.2 09/24/2023   Lab Results  Component Value Date   HGBA1C 5.6 09/24/2023      Assessment & Plan:  Hyperlipidemia LDL goal <70 Assessment & Plan: Encouraged to continue taking atorvastatin 80 mg daily and ezetimibe 10 mg daily Encourage a heart healthy diet with increased physical activity Will assess lipid panel today Lab Results  Component Value Date   CHOL 132 09/24/2023   HDL 41 09/24/2023   LDLCALC 73 09/24/2023   TRIG 97 09/24/2023   CHOLHDL 3.2 09/24/2023     Orders: -     Lipid panel -     CMP14+EGFR -     CBC with Differential/Platelet  Primary hypertension Assessment & Plan: Controlled He takes losartan hydrochlorothiazide 100-25 mg, amlodipine 10 mg daily, hydralazine 100 mg 3 times daily, metoprolol 25 mg twice daily Asymptomatic  in the clinic Encouraged low-sodium diet with increased physical activity BP Readings from Last 3 Encounters:  09/24/23 138/82  08/19/23 (!) 149/80  05/25/23 136/69      Chronic foot pain, left Assessment & Plan: The patient is under the care of podiatry, with the last follow-up occurring on 06/17/2023,  during which he received joint injections and was noted to 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 He has not followed up with his podiatrist since then. The patient was encouraged to schedule a follow-up appointment with podiatry and to continue taking gabapentin as needed for pain relief. Nonpharmacological measures, including heat application, rest, and avoidance of activities that aggravate symptoms, were also recommended. Additionally, the patient was encouraged to apply over-the-counter Advil Targeted Relief pain-relieving cream to the affected site. The patient verbalized understanding and is aware of the plan of care.    TSH (thyroid-stimulating hormone deficiency) -     TSH + free T4  Vitamin D deficiency -     VITAMIN D 25 Hydroxy (Vit-D Deficiency, Fractures)  IFG (impaired fasting glucose) -     Hemoglobin A1c  Note: This chart has been completed using Engineer, civil (consulting) software, and while attempts have been made to ensure accuracy, certain words and phrases may not be transcribed as intended.    Follow-up: Return in about 4 months (around 01/23/2024).   Gilmore Laroche, FNP

## 2023-09-24 NOTE — Patient Instructions (Addendum)
I appreciate the opportunity to provide care to you today!    Follow up:  4 months  Labs: please stop by the lab today to get your blood drawn (CBC, CMP, TSH, Lipid profile, HgA1c, Vit D)   Please follow up with podiatry!   Attached with your AVS, you will find valuable resources for self-education. I highly recommend dedicating some time to thoroughly examine them.   Please continue to a heart-healthy diet and increase your physical activities. Try to exercise for at least five days a week.    It was a pleasure to see you and I look forward to continuing to work together on your health and well-being. Please do not hesitate to call the office if you need care or have questions about your care.  In case of emergency, please visit the Emergency Department for urgent care, or contact our clinic at [phone] to schedule an appointment. We're here to help you!   Have a wonderful day and week. With Gratitude, Gilmore Laroche MSN, FNP-BC

## 2023-09-25 LAB — LIPID PANEL
Chol/HDL Ratio: 3.2 {ratio} (ref 0.0–5.0)
Cholesterol, Total: 132 mg/dL (ref 100–199)
HDL: 41 mg/dL (ref >39)
LDL Chol Calc (NIH): 73 mg/dL (ref 0–99)
Triglycerides: 97 mg/dL (ref 0–149)
VLDL Cholesterol Cal: 18 mg/dL (ref 5–40)

## 2023-09-25 LAB — HEMOGLOBIN A1C
Est. average glucose Bld gHb Est-mCnc: 114 mg/dL
Hgb A1c MFr Bld: 5.6 % (ref 4.8–5.6)

## 2023-09-25 LAB — CMP14+EGFR
ALT: 10 [IU]/L (ref 0–44)
AST: 23 [IU]/L (ref 0–40)
Albumin: 4.4 g/dL (ref 3.8–4.9)
Alkaline Phosphatase: 90 [IU]/L (ref 44–121)
BUN/Creatinine Ratio: 15 (ref 10–24)
BUN: 18 mg/dL (ref 8–27)
Bilirubin Total: 0.4 mg/dL (ref 0.0–1.2)
CO2: 21 mmol/L (ref 20–29)
Calcium: 10.2 mg/dL (ref 8.6–10.2)
Chloride: 102 mmol/L (ref 96–106)
Creatinine, Ser: 1.18 mg/dL (ref 0.76–1.27)
Globulin, Total: 3.2 g/dL (ref 1.5–4.5)
Glucose: 101 mg/dL — ABNORMAL HIGH (ref 70–99)
Potassium: 3.6 mmol/L (ref 3.5–5.2)
Sodium: 139 mmol/L (ref 134–144)
Total Protein: 7.6 g/dL (ref 6.0–8.5)
eGFR: 71 mL/min/{1.73_m2} (ref >59)

## 2023-09-25 LAB — CBC WITH DIFFERENTIAL/PLATELET
Basophils Absolute: 0.1 10*3/uL (ref 0.0–0.2)
Basos: 1 %
EOS (ABSOLUTE): 0.3 10*3/uL (ref 0.0–0.4)
Eos: 3 %
Hematocrit: 42.7 % (ref 37.5–51.0)
Hemoglobin: 13.9 g/dL (ref 13.0–17.7)
Immature Grans (Abs): 0 10*3/uL (ref 0.0–0.1)
Immature Granulocytes: 0 %
Lymphocytes Absolute: 1.8 10*3/uL (ref 0.7–3.1)
Lymphs: 18 %
MCH: 29.1 pg (ref 26.6–33.0)
MCHC: 32.6 g/dL (ref 31.5–35.7)
MCV: 89 fL (ref 79–97)
Monocytes Absolute: 0.8 10*3/uL (ref 0.1–0.9)
Monocytes: 8 %
Neutrophils Absolute: 7.1 10*3/uL — ABNORMAL HIGH (ref 1.4–7.0)
Neutrophils: 70 %
Platelets: 345 10*3/uL (ref 150–450)
RBC: 4.78 x10E6/uL (ref 4.14–5.80)
RDW: 14 % (ref 11.6–15.4)
WBC: 10.2 10*3/uL (ref 3.4–10.8)

## 2023-09-25 LAB — TSH+FREE T4
Free T4: 1.54 ng/dL (ref 0.82–1.77)
TSH: 1.17 u[IU]/mL (ref 0.450–4.500)

## 2023-09-25 LAB — VITAMIN D 25 HYDROXY (VIT D DEFICIENCY, FRACTURES): Vit D, 25-Hydroxy: 39.8 ng/mL (ref 30.0–100.0)

## 2023-09-25 NOTE — Assessment & Plan Note (Signed)
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 He has not followed up with his podiatrist since then. The patient was encouraged to schedule a follow-up appointment with podiatry and to continue taking gabapentin as needed for pain relief. Nonpharmacological measures, including heat application, rest, and avoidance of activities that aggravate symptoms, were also recommended. Additionally, the patient was encouraged to apply over-the-counter Advil Targeted Relief pain-relieving cream to the affected site. The patient verbalized understanding and is aware of the plan of care.

## 2023-09-25 NOTE — Assessment & Plan Note (Signed)
Encouraged to continue taking atorvastatin 80 mg daily and ezetimibe 10 mg daily Encourage a heart healthy diet with increased physical activity Will assess lipid panel today Lab Results  Component Value Date   CHOL 132 09/24/2023   HDL 41 09/24/2023   LDLCALC 73 09/24/2023   TRIG 97 09/24/2023   CHOLHDL 3.2 09/24/2023

## 2023-09-25 NOTE — Assessment & Plan Note (Signed)
Controlled He takes losartan hydrochlorothiazide 100-25 mg, amlodipine 10 mg daily, hydralazine 100 mg 3 times daily, metoprolol 25 mg twice daily Asymptomatic in the clinic Encouraged low-sodium diet with increased physical activity BP Readings from Last 3 Encounters:  09/24/23 138/82  08/19/23 (!) 149/80  05/25/23 136/69

## 2023-09-27 NOTE — Progress Notes (Signed)
Please inform the patient that his labs are stable

## 2023-10-26 ENCOUNTER — Ambulatory Visit: Payer: Medicare HMO | Admitting: Podiatry

## 2023-12-14 ENCOUNTER — Encounter: Payer: Self-pay | Admitting: Podiatry

## 2023-12-14 ENCOUNTER — Ambulatory Visit: Payer: Medicare HMO | Admitting: Podiatry

## 2023-12-14 DIAGNOSIS — M7752 Other enthesopathy of left foot: Secondary | ICD-10-CM | POA: Diagnosis not present

## 2023-12-16 NOTE — Progress Notes (Addendum)
 Subjective:   Patient ID: Scott Ritter, male   DOB: 63 y.o.   MRN: [id number]   HPI Patient states he is just having severe pain in the left second MPJ and states after it was treated last September it never really improved he just was not able to get back.  He is interested in surgery to try to rectify the situation.  Patient states that he has tried different shoe gear he has tried anti-inflammatories we have done previous injections we have done padding and inserts without relief of symptoms   ROS      Objective:  Physical Exam  Neurovascular status intact with chronic inflammation pain of the second MPJ left with fluid buildup around the joint surface and pain with palpation to the joint     Assessment:  Inflammatory capsulitis second MPJ left fluid around the joint failed to respond conservatively stating the pain has been present for several years getting worse over the last 6 months     Plan:  H&P reviewed he does not want further injection as it was not successful and he like to consider a possible surgical correction.  I did discuss shortening osteotomy and I did discover and discuss the procedure and risk and he is willing to accept risk wants surgery and after extensive review signed consent form understanding no guarantee this will solve the problem all complications alternative treatments reviewed as listed.  Patient scheduled for this procedure air fracture walker dispensed to fit properly to the lower leg and I like him to get used to it prior to surgery and be able to take it on and off himself and also be well balanced on the opposite leg and he does have stroke so I would like him to get used to this.  Encouraged to call questions concerns which may arise.  Patient x-rays do indicate that there is an elongated second metatarsal segment IM angle of approximately 10 degrees and no indication of stress fracture

## 2023-12-23 ENCOUNTER — Ambulatory Visit (HOSPITAL_BASED_OUTPATIENT_CLINIC_OR_DEPARTMENT_OTHER): Payer: Medicare HMO | Admitting: Internal Medicine

## 2023-12-23 ENCOUNTER — Encounter (HOSPITAL_BASED_OUTPATIENT_CLINIC_OR_DEPARTMENT_OTHER): Payer: Self-pay | Admitting: Internal Medicine

## 2023-12-23 VITALS — BP 162/80 | HR 47 | Ht 67.0 in | Wt 191.7 lb

## 2023-12-23 DIAGNOSIS — I10 Essential (primary) hypertension: Secondary | ICD-10-CM

## 2023-12-23 DIAGNOSIS — E785 Hyperlipidemia, unspecified: Secondary | ICD-10-CM | POA: Diagnosis not present

## 2023-12-23 DIAGNOSIS — Z8673 Personal history of transient ischemic attack (TIA), and cerebral infarction without residual deficits: Secondary | ICD-10-CM | POA: Diagnosis not present

## 2023-12-23 NOTE — Patient Instructions (Signed)
 Medication Instructions:  NO CHANGES  *If you need a refill on your cardiac medications before your next appointment, please call your pharmacy*  Lab Work: NMR lipoprofile and LPa today   Follow-Up: At [facility], you and your health needs are our priority.  As part of our continuing mission to provide you with exceptional heart care, we have created designated Provider Care Teams.  These Care Teams include your primary Cardiologist (physician) and Advanced Practice Providers (APPs -  Physician Assistants and Nurse Practitioners) who all work together to provide you with the care you need, when you need it.  We recommend signing up for the patient portal called "MyChart".  Sign up information is provided on this After Visit Summary.  MyChart is used to connect with patients for Virtual Visits (Telemedicine).  Patients are able to view lab/test results, encounter notes, upcoming appointments, etc.  Non-urgent messages can be sent to your provider as well.   To learn more about what you can do with MyChart, go to [email or web address].    Your next appointment:   AS NEEDED with Dr. Rennis Golden -- pending results

## 2023-12-23 NOTE — Progress Notes (Signed)
 LIPID CLINIC CONSULT NOTE  Chief Complaint:  Manage dyslipidemia  Primary Care Physician: Gilmore Laroche, FNP  Primary Cardiologist:  None  HPI:  Scott Ritter is a 63 y.o. male who is being seen today for the evaluation of dyslipidemia at the request of Gilmore Laroche, FNP.  This a pleasant 63 year old male who was kindly referred for evaluation management of dyslipidemia.  He has a remote history of stroke with some left-sided weakness about 16 years ago but has not had recurrence.  He also has a history of hypertension.  Home blood pressure readings are generally in the 130s systolic although he was 162/80 in the office today.  He says he has not taken all of his medications.  Other than that he has a history of Fleet reflux.  He had dyslipidemia which was not at target.  He has been on atorvastatin 80 mg daily and ezetimibe.  His PCP had ordered of Repatha but it was never started.  Repeat labs in December however showed improvement in his cholesterol with dietary modification including total cholesterol 132, triglycerides 97, HDL 41 and LDL 73.  We discussed that he is near a target LDL less than 70 but probably could benefit from further investigation.  PMHx:  Past Medical History:  Diagnosis Date   GERD (gastroesophageal reflux disease)    Hypertension    Stroke [facility])    left side deficits    Past Surgical History:  Procedure Laterality Date   BACK SURGERY     BIOPSY  09/17/2018   Procedure: BIOPSY;  Surgeon: West Bali, MD;  [facility];  Service: Endoscopy;;  duodenal biopsy    COLONOSCOPY N/A 02/29/2016   Dr. Darrick Penna: redundant colon, non-bleeding internal hemorrhoids    COLONOSCOPY WITH PROPOFOL N/A 04/09/2021   Procedure: COLONOSCOPY WITH PROPOFOL;  Surgeon: Lanelle Bal, DO;  [facility];  Service: Endoscopy;  Laterality: N/A;  7:30am   ESOPHAGOGASTRODUODENOSCOPY N/A 12/17/2016   Dr. Darrick Penna: 3 mm nonbleeding Mallory-Weiss  tear.  EGD performed for hematemesis.  Hemoglobin normal.   ESOPHAGOGASTRODUODENOSCOPY N/A 02/19/2018   Dr. Jena Gauss: Performed for melena, hemoglobin of 6.  Mucosal changes in the esophagus query short segment Barrett's, biopsy more consistent with reflux changes.  Erosive gastropathy but no H. pylori.  Duodenal erosions.  Suspected NSAID induced injury.   ESOPHAGOGASTRODUODENOSCOPY  08/18/2018   Dr. Karilyn Cota: IDA/heme positive stool.  Esophageal mucosal changes consistent with short segment Barrett's esophagus, not biopsied.  2 cm hiatal hernia.  Duodenal bulb, second portion of duodenum, third portion of the duodenum.  Video capsule somewhat difficult to pass through the oropharynx but was eventually advanced into the second portion of the duodenum and released.   ESOPHAGOGASTRODUODENOSCOPY N/A 08/18/2018   Procedure: ESOPHAGOGASTRODUODENOSCOPY (EGD);  Surgeon: Malissa Hippo, MD;  [facility];  Service: Endoscopy;  Laterality: N/A;   ESOPHAGOGASTRODUODENOSCOPY N/A 09/17/2018   gastritis, 2 nonbleeding angiectasia's in the duodenum were treated with [facility] coagulation status post biopsy.   FOOT SURGERY Left    GIVENS CAPSULE STUDY N/A 08/18/2018   Procedure: GIVENS CAPSULE STUDY;  Surgeon: Malissa Hippo, MD;  [facility];  Service: Endoscopy;  Laterality: N/A;   HERNIA REPAIR     POLYPECTOMY  04/09/2021   Procedure: POLYPECTOMY;  Surgeon: Lanelle Bal, DO;  [facility];  Service: Endoscopy;;  descending; cecal    FAMHx:  Family History  Problem Relation Age of Onset   Hypertension  Mother    Hypertension Father    Colon cancer Father    Colon polyps Neg Hx     SOCHx:   reports that he has never smoked. He has never used smokeless tobacco. He reports current alcohol use of about 1.0 standard drink of alcohol per week. He reports that he does not use drugs.  ALLERGIES:  No Known Allergies  ROS: Pertinent items noted in HPI and remainder of  comprehensive ROS otherwise negative.  HOME MEDS: Current Outpatient Medications on File Prior to Visit  Medication Sig Dispense Refill   acetaminophen (TYLENOL) 500 MG tablet Take 500 mg by mouth every 6 (six) hours as needed for moderate pain.     amLODipine (NORVASC) 10 MG tablet TAKE 1 TABLET EVERY DAY 90 tablet 10   aspirin EC 81 MG EC tablet Take 1 tablet (81 mg total) by mouth daily. Swallow whole. 30 tablet 11   atorvastatin (LIPITOR) 80 MG tablet TAKE 1 TABLET (80 MG TOTAL) BY MOUTH DAILY. 90 tablet 3   Cholecalciferol (VITAMIN D) 2000 units CAPS Take 2,000 Units by mouth daily.     Evolocumab (REPATHA SURECLICK) 140 MG/ML SOAJ Inject 140 mg into the skin every 14 (fourteen) days. 2 mL 2   ezetimibe (ZETIA) 10 MG tablet Take 1 tablet (10 mg total) by mouth daily. 90 tablet 3   gabapentin (NEURONTIN) 300 MG capsule Take 1 capsule (300 mg total) by mouth 2 (two) times daily. 90 capsule 3   hydrALAZINE (APRESOLINE) 100 MG tablet TAKE 1 TABLET THREE TIMES DAILY 270 tablet 3   losartan-hydrochlorothiazide (HYZAAR) 100-25 MG tablet TAKE 1 TABLET EVERY DAY 90 tablet 10   metoprolol tartrate (LOPRESSOR) 25 MG tablet TAKE 1 TABLET TWICE DAILY 180 tablet 10   potassium chloride SA (KLOR-CON M) 20 MEQ tablet TAKE 1 TABLET TWICE DAILY 180 tablet 3   colchicine 0.6 MG tablet Take 1 tablet (0.6 mg total) by mouth 2 (two) times daily for 7 days. 14 tablet 0   No current facility-administered medications on file prior to visit.    LABS/IMAGING: No results found for this or any previous visit (from the past 48 hours). No results found.  LIPID PANEL:    Component Value Date/Time   CHOL 132 09/24/2023 0845   TRIG 97 09/24/2023 0845   HDL 41 09/24/2023 0845   CHOLHDL 3.2 09/24/2023 0845   CHOLHDL 5.8 (H) 08/07/2020 0918   VLDL 29 01/25/2019 0359   LDLCALC 73 09/24/2023 0845   LDLCALC 123 (H) 08/07/2020 0918    WEIGHTS: Wt Readings from Last 3 Encounters:  12/23/23 191 lb 11.2 oz (87 kg)   09/24/23 185 lb 1.3 oz (84 kg)  08/19/23 182 lb (82.6 kg)    VITALS: BP (!) 162/80   Pulse (!) 47   Ht 5\' 7"  (1.702 m)   Wt 191 lb 11.2 oz (87 kg)   SpO2 97%   BMI 30.02 kg/m   EXAM: Deferred  EKG: Deferred  ASSESSMENT: Dyslipidemia, goal LDL less than 70 History of stroke Hypertension  PLAN: 1.   Mr. Withem has a history of stroke with a target LDL less than 70.  He was near that goal of December on atorvastatin high intensity dose as well as ezetimibe.  He was prescribed for Repatha but never started the medicine.  I would like to reassess his lipids today.  If he is close to or meeting target then we will continue on the current therapies.  Also will check  an LP(a).  If this is high and he is not at target would consider the Repatha as it may provide some benefit in lowering LP(a) as well.  Thanks again for the kind referral.  Follow-up as needed based on lipid-lowering therapies.  Chrystie Nose, MD, [facility], FACP  [place]  [facility]  Medical Director of the Advanced Lipid Disorders &  Cardiovascular Risk Reduction Clinic Diplomate of the American Board of Clinical Lipidology Attending Cardiologist  Direct Dial: [phone]  Fax: [phone]  Website:  [email or web address] 12/23/2023, 9:47 AM

## 2023-12-24 LAB — NMR, LIPOPROFILE
Cholesterol, Total: 152 mg/dL (ref 100–199)
HDL Particle Number: 35.3 umol/L (ref >=30.5)
HDL-C: 44 mg/dL (ref >39)
LDL Particle Number: 1347 nmol/L — ABNORMAL HIGH (ref <1000)
LDL Size: 20 nm — ABNORMAL LOW (ref >20.5)
LDL-C (NIH Calc): 87 mg/dL (ref 0–99)
LP-IR Score: 60 — ABNORMAL HIGH (ref <=45)
Small LDL Particle Number: 915 nmol/L — ABNORMAL HIGH (ref <=527)
Triglycerides: 114 mg/dL (ref 0–149)

## 2023-12-24 LAB — LIPOPROTEIN A (LPA): Lipoprotein (a): 179.8 nmol/L — ABNORMAL HIGH (ref <75.0)

## 2023-12-29 ENCOUNTER — Telehealth: Payer: Self-pay | Admitting: Podiatry

## 2023-12-29 NOTE — Telephone Encounter (Signed)
 Pt left message today at 812am has surgery scheduled on 01/05/24 and has questions about his copay.  Notified pt I would be sending this to the billing so they could give him an estimate for the surgery. I did explain that  the estimate that is provided by our office is only for the doctors portion.

## 2023-12-30 ENCOUNTER — Telehealth: Payer: Self-pay | Admitting: Urology

## 2023-12-30 NOTE — Telephone Encounter (Signed)
 I spoke with pt, he wants to cxl his sx on 01/05/24 with Dr. Charlsie Merles due to the copay that he is needing at [facility], pt said he would call back to reschedule. I have informed Aram Beecham with [facility] and Dr. Charlsie Merles of this information

## 2024-01-12 ENCOUNTER — Encounter: Admitting: Podiatry

## 2024-01-23 ENCOUNTER — Other Ambulatory Visit: Payer: Self-pay | Admitting: Family Medicine

## 2024-01-23 DIAGNOSIS — I10 Essential (primary) hypertension: Secondary | ICD-10-CM

## 2024-01-25 ENCOUNTER — Ambulatory Visit (INDEPENDENT_AMBULATORY_CARE_PROVIDER_SITE_OTHER): Payer: Medicare HMO | Admitting: Family Medicine

## 2024-01-25 ENCOUNTER — Encounter: Payer: Self-pay | Admitting: Family Medicine

## 2024-01-25 VITALS — BP 152/81 | HR 58 | Ht 67.0 in | Wt 190.1 lb

## 2024-01-25 DIAGNOSIS — R7301 Impaired fasting glucose: Secondary | ICD-10-CM

## 2024-01-25 DIAGNOSIS — I10 Essential (primary) hypertension: Secondary | ICD-10-CM

## 2024-01-25 DIAGNOSIS — E038 Other specified hypothyroidism: Secondary | ICD-10-CM

## 2024-01-25 DIAGNOSIS — E559 Vitamin D deficiency, unspecified: Secondary | ICD-10-CM

## 2024-01-25 DIAGNOSIS — E7849 Other hyperlipidemia: Secondary | ICD-10-CM

## 2024-01-25 DIAGNOSIS — M7752 Other enthesopathy of left foot: Secondary | ICD-10-CM | POA: Insufficient documentation

## 2024-01-25 MED ORDER — OXYCODONE-ACETAMINOPHEN 5-325 MG PO TABS: 1.0000 | ORAL_TABLET | Freq: Four times a day (QID) | ORAL | 0 refills | Status: AC | PRN

## 2024-01-25 NOTE — Assessment & Plan Note (Addendum)
 The patient is currently under the care of podiatry, but is requesting a second opinion. He reports chronic foot pain, which he describes as severe (rated 12 out of 10), unrelieved by NSAIDs and injections. The patient is unsure whether surgical intervention would be beneficial. A short course of Percocet 5/325 mg has been prescribed to take every 6 hours as needed for pain rated greater than 7. The patient was informed that only 10 to 15 tablets will be provided monthly, if needed, and that long-term opioid therapy is not recommended. A referral has been placed to podiatry for a second opinion. The patient verbalized understanding and is aware of the plan of care.

## 2024-01-25 NOTE — Progress Notes (Signed)
 Established Patient Office Visit  Subjective:  Patient ID: Scott Ritter, male    DOB: 08/25/1961  Age: 63 y.o. MRN: [id number]  CC:  Chief Complaint  Patient presents with   Medical Management of Chronic Issues    4 month f/u     Foot Pain    Has seen specialists. Surgery was discussed. Chronic foot Rt foot pain     HPI Scott Ritter is a 63 y.o. male with past medical history of hypertension, hyperlipidemia presents for f/u of  chronic medical conditions.  For the details of today's visit, please refer to the assessment and plan.     Past Medical History:  Diagnosis Date   GERD (gastroesophageal reflux disease)    Hypertension    Stroke [facility])    left side deficits    Past Surgical History:  Procedure Laterality Date   BACK SURGERY     BIOPSY  09/17/2018   Procedure: BIOPSY;  Surgeon: West Bali, MD;  [facility];  Service: Endoscopy;;  duodenal biopsy    COLONOSCOPY N/A 02/29/2016   Dr. Darrick Penna: redundant colon, non-bleeding internal hemorrhoids    COLONOSCOPY WITH PROPOFOL N/A 04/09/2021   Procedure: COLONOSCOPY WITH PROPOFOL;  Surgeon: Lanelle Bal, DO;  [facility];  Service: Endoscopy;  Laterality: N/A;  7:30am   ESOPHAGOGASTRODUODENOSCOPY N/A 12/17/2016   Dr. Darrick Penna: 3 mm nonbleeding Mallory-Weiss tear.  EGD performed for hematemesis.  Hemoglobin normal.   ESOPHAGOGASTRODUODENOSCOPY N/A 02/19/2018   Dr. Jena Gauss: Performed for melena, hemoglobin of 6.  Mucosal changes in the esophagus query short segment Barrett's, biopsy more consistent with reflux changes.  Erosive gastropathy but no H. pylori.  Duodenal erosions.  Suspected NSAID induced injury.   ESOPHAGOGASTRODUODENOSCOPY  08/18/2018   Dr. Karilyn Cota: IDA/heme positive stool.  Esophageal mucosal changes consistent with short segment Barrett's esophagus, not biopsied.  2 cm hiatal hernia.  Duodenal bulb, second portion of duodenum, third portion of the duodenum.  Video capsule  somewhat difficult to pass through the oropharynx but was eventually advanced into the second portion of the duodenum and released.   ESOPHAGOGASTRODUODENOSCOPY N/A 08/18/2018   Procedure: ESOPHAGOGASTRODUODENOSCOPY (EGD);  Surgeon: Malissa Hippo, MD;  [facility];  Service: Endoscopy;  Laterality: N/A;   ESOPHAGOGASTRODUODENOSCOPY N/A 09/17/2018   gastritis, 2 nonbleeding angiectasia's in the duodenum were treated with [facility] coagulation status post biopsy.   FOOT SURGERY Left    GIVENS CAPSULE STUDY N/A 08/18/2018   Procedure: GIVENS CAPSULE STUDY;  Surgeon: Malissa Hippo, MD;  [facility];  Service: Endoscopy;  Laterality: N/A;   HERNIA REPAIR     POLYPECTOMY  04/09/2021   Procedure: POLYPECTOMY;  Surgeon: Lanelle Bal, DO;  [facility];  Service: Endoscopy;;  descending; cecal    Family History  Problem Relation Age of Onset   Hypertension Mother    Hypertension Father    Colon cancer Father    Colon polyps Neg Hx     Social History   Socioeconomic History   Marital status: Divorced    Spouse name: Not on file   Number of children: 2   Years of education: Not on file   Highest education level: Not on file  Occupational History   Not on file  Tobacco Use   Smoking status: Never   Smokeless tobacco: Never  Vaping Use   Vaping status: Never Used  Substance and Sexual Activity   Alcohol use: Yes  Alcohol/week: 1.0 standard drink of alcohol    Types: 1 Cans of beer per week    Comment: occasionally; once a week or once a month   Drug use: No   Sexual activity: Yes    Comment: not married hs sexual partner  Other Topics Concern   Not on file  Social History Narrative   PT lives with his mother, he is on disability for his stroke.    Social Drivers of Corporate investment banker Strain: Low Risk  (01/31/2021)   Overall Financial Resource Strain (CARDIA)    Difficulty of Paying Living Expenses: Not hard at all  Food  Insecurity: No Food Insecurity (12/23/2023)   Hunger Vital Sign    Worried About Running Out of Food in the Last Year: Never true    Ran Out of Food in the Last Year: Never true  Transportation Needs: No Transportation Needs (12/23/2023)   PRAPARE - Administrator, Civil Service (Medical): No    Lack of Transportation (Non-Medical): No  Physical Activity: Insufficiently Active (12/23/2023)   Exercise Vital Sign    Days of Exercise per Week: 2 days    Minutes of Exercise per Session: 50 min  Stress: No Stress Concern Present (01/31/2021)   [employer] of Occupational Health - Occupational Stress Questionnaire    Feeling of Stress : Not at all  Social Connections: Socially Integrated (01/31/2021)   Social Connection and Isolation Panel [NHANES]    Frequency of Communication with Friends and Family: More than three times a week    Frequency of Social Gatherings with Friends and Family: More than three times a week    Attends Religious Services: More than 4 times per year    Active Member of [employer] or Organizations: Yes    Attends Banker Meetings: More than 4 times per year    Marital Status: Married  Catering manager Violence: Not At Risk (01/31/2021)   Humiliation, Afraid, Rape, and Kick questionnaire    Fear of Current or Ex-Partner: No    Emotionally Abused: No    Physically Abused: No    Sexually Abused: No    Outpatient Medications Prior to Visit  Medication Sig Dispense Refill   amLODipine (NORVASC) 10 MG tablet TAKE 1 TABLET EVERY DAY 90 tablet 10   aspirin EC 81 MG EC tablet Take 1 tablet (81 mg total) by mouth daily. Swallow whole. 30 tablet 11   atorvastatin (LIPITOR) 80 MG tablet TAKE 1 TABLET (80 MG TOTAL) BY MOUTH DAILY. 90 tablet 3   Cholecalciferol (VITAMIN D) 2000 units CAPS Take 2,000 Units by mouth daily.     Evolocumab (REPATHA SURECLICK) 140 MG/ML SOAJ Inject 140 mg into the skin every 14 (fourteen) days. 2 mL 2   ezetimibe (ZETIA) 10 MG  tablet Take 1 tablet (10 mg total) by mouth daily. 90 tablet 3   gabapentin (NEURONTIN) 300 MG capsule Take 1 capsule (300 mg total) by mouth 2 (two) times daily. 90 capsule 3   hydrALAZINE (APRESOLINE) 100 MG tablet TAKE 1 TABLET THREE TIMES DAILY 270 tablet 3   losartan-hydrochlorothiazide (HYZAAR) 100-25 MG tablet TAKE 1 TABLET EVERY DAY 90 tablet 10   metoprolol tartrate (LOPRESSOR) 25 MG tablet TAKE 1 TABLET TWICE DAILY 180 tablet 10   potassium chloride SA (KLOR-CON M) 20 MEQ tablet TAKE 1 TABLET TWICE DAILY 180 tablet 3   acetaminophen (TYLENOL) 500 MG tablet Take 500 mg by mouth every 6 (six) hours as needed  for moderate pain.     colchicine 0.6 MG tablet Take 1 tablet (0.6 mg total) by mouth 2 (two) times daily for 7 days. 14 tablet 0   No facility-administered medications prior to visit.    No Known Allergies  ROS Review of Systems  Constitutional:  Negative for fatigue and fever.  Eyes:  Negative for visual disturbance.  Respiratory:  Negative for chest tightness and shortness of breath.   Cardiovascular:  Negative for chest pain and palpitations.  Musculoskeletal:        Right foot pain  Neurological:  Negative for dizziness and headaches.      Objective:    Physical Exam HENT:     Head: Normocephalic.     Right Ear: External ear normal.     Left Ear: External ear normal.     Nose: No congestion or rhinorrhea.     Mouth/Throat:     Mouth: Mucous membranes are moist.  Cardiovascular:     Rate and Rhythm: Regular rhythm.     Heart sounds: No murmur heard. Pulmonary:     Effort: No respiratory distress.     Breath sounds: Normal breath sounds.  Neurological:     Mental Status: He is alert.     BP (!) 152/81   Pulse (!) 58   Ht 5\' 7"  (1.702 m)   Wt 190 lb 1.3 oz (86.2 kg)   SpO2 95%   BMI 29.77 kg/m  Wt Readings from Last 3 Encounters:  01/25/24 190 lb 1.3 oz (86.2 kg)  12/23/23 191 lb 11.2 oz (87 kg)  09/24/23 185 lb 1.3 oz (84 kg)    Lab Results   Component Value Date   TSH 1.170 09/24/2023   Lab Results  Component Value Date   WBC 10.2 09/24/2023   HGB 13.9 09/24/2023   HCT 42.7 09/24/2023   MCV 89 09/24/2023   PLT 345 09/24/2023   Lab Results  Component Value Date   NA 139 09/24/2023   K 3.6 09/24/2023   CO2 21 09/24/2023   GLUCOSE 101 (H) 09/24/2023   BUN 18 09/24/2023   CREATININE 1.18 09/24/2023   BILITOT 0.4 09/24/2023   ALKPHOS 90 09/24/2023   AST 23 09/24/2023   ALT 10 09/24/2023   PROT 7.6 09/24/2023   ALBUMIN 4.4 09/24/2023   CALCIUM 10.2 09/24/2023   ANIONGAP 7 10/24/2021   EGFR 71 09/24/2023   Lab Results  Component Value Date   CHOL 132 09/24/2023   Lab Results  Component Value Date   HDL 41 09/24/2023   Lab Results  Component Value Date   LDLCALC 73 09/24/2023   Lab Results  Component Value Date   TRIG 97 09/24/2023   Lab Results  Component Value Date   CHOLHDL 3.2 09/24/2023   Lab Results  Component Value Date   HGBA1C 5.6 09/24/2023      Assessment & Plan:  Primary hypertension Assessment & Plan: The patient's blood pressure remains uncontrolled, likely related to elevated pain levels. He is currently taking the following antihypertensive regimen: Losartan-Hydrochlorothiazide 100/25 mg daily, Amlodipine 10 mg daily, Hydralazine 100 mg three times daily, Metoprolol 25 mg twice daily. The patient is currently asymptomatic in the clinic, and no changes will be made to the current treatment regimen at this time. Encourage adherence to a low-sodium diet Promote increased physical activity as tolerated  BP Readings from Last 3 Encounters:  01/25/24 (!) 152/81  12/23/23 (!) 162/80  09/24/23 138/82  Capsulitis of metatarsophalangeal (MTP) joint of left foot Assessment & Plan: The patient is currently under the care of podiatry, but is requesting a second opinion. He reports chronic foot pain, which he describes as severe (rated 12 out of 10), unrelieved by NSAIDs and  injections. The patient is unsure whether surgical intervention would be beneficial. A short course of Percocet 5/325 mg has been prescribed to take every 6 hours as needed for pain rated greater than 7. The patient was informed that only 10 to 15 tablets will be provided monthly, if needed, and that long-term opioid therapy is not recommended. A referral has been placed to podiatry for a second opinion. The patient verbalized understanding and is aware of the plan of care.   Orders: -     Ambulatory referral to Podiatry -     oxyCODONE-Acetaminophen; Take 1 tablet by mouth every 6 (six) hours as needed for up to 5 days for severe pain (pain score 7-10).  Dispense: 15 tablet; Refill: 0  IFG (impaired fasting glucose) -     Hemoglobin A1c  Vitamin D deficiency -     VITAMIN D 25 Hydroxy (Vit-D Deficiency, Fractures)  TSH (thyroid-stimulating hormone deficiency) -     TSH + free T4  Other hyperlipidemia -     Lipid panel -     CMP14+EGFR -     CBC with Differential/Platelet  Note: This chart has been completed using Engineer, civil (consulting) software, and while attempts have been made to ensure accuracy, certain words and phrases may not be transcribed as intended.    Follow-up: Return in about 4 months (around 05/26/2024).   Ramari Bray, FNP

## 2024-01-25 NOTE — Patient Instructions (Addendum)
 I appreciate the opportunity to provide care to you today!    Follow up:  4 months  Labs: please stop by the lab today to get your blood drawn (CBC, CMP, TSH, Lipid profile, HgA1c, Vit D)  Start taking Percocet 5/325 mg every 6 hours as needed for pain rated greater than 7.  Referrals today- Podiatry    Please continue to a heart-healthy diet and increase your physical activities. Try to exercise for at least five days a week.    It was a pleasure to see you and I look forward to continuing to work together on your health and well-being. Please do not hesitate to call the office if you need care or have questions about your care.  In case of emergency, please visit the Emergency Department for urgent care, or contact our clinic at [phone] to schedule an appointment. We're here to help you!   Have a wonderful day and week. With Gratitude, Kamarri Lovvorn MSN, FNP-BC

## 2024-01-25 NOTE — Assessment & Plan Note (Signed)
 The patient's blood pressure remains uncontrolled, likely related to elevated pain levels. He is currently taking the following antihypertensive regimen: Losartan-Hydrochlorothiazide 100/25 mg daily, Amlodipine 10 mg daily, Hydralazine 100 mg three times daily, Metoprolol 25 mg twice daily. The patient is currently asymptomatic in the clinic, and no changes will be made to the current treatment regimen at this time. Encourage adherence to a low-sodium diet Promote increased physical activity as tolerated  BP Readings from Last 3 Encounters:  01/25/24 (!) 152/81  12/23/23 (!) 162/80  09/24/23 138/82

## 2024-01-26 ENCOUNTER — Encounter: Admitting: Podiatry

## 2024-01-26 LAB — CMP14+EGFR
ALT: 12 IU/L (ref 0–44)
AST: 21 IU/L (ref 0–40)
Albumin: 4.2 g/dL (ref 3.8–4.9)
Alkaline Phosphatase: 104 IU/L (ref 44–121)
BUN/Creatinine Ratio: 11 (ref 10–24)
BUN: 13 mg/dL (ref 8–27)
Bilirubin Total: 0.4 mg/dL (ref 0.0–1.2)
CO2: 22 mmol/L (ref 20–29)
Calcium: 9.7 mg/dL (ref 8.6–10.2)
Chloride: 101 mmol/L (ref 96–106)
Creatinine, Ser: 1.15 mg/dL (ref 0.76–1.27)
Globulin, Total: 3.2 g/dL (ref 1.5–4.5)
Glucose: 104 mg/dL — ABNORMAL HIGH (ref 70–99)
Potassium: 3.7 mmol/L (ref 3.5–5.2)
Sodium: 137 mmol/L (ref 134–144)
Total Protein: 7.4 g/dL (ref 6.0–8.5)
eGFR: 73 mL/min/{1.73_m2} (ref >59)

## 2024-01-26 LAB — CBC WITH DIFFERENTIAL/PLATELET
Basophils Absolute: 0.1 10*3/uL (ref 0.0–0.2)
Basos: 1 %
EOS (ABSOLUTE): 0.2 10*3/uL (ref 0.0–0.4)
Eos: 3 %
Hematocrit: 42.5 % (ref 37.5–51.0)
Hemoglobin: 13.9 g/dL (ref 13.0–17.7)
Immature Grans (Abs): 0 10*3/uL (ref 0.0–0.1)
Immature Granulocytes: 0 %
Lymphocytes Absolute: 2 10*3/uL (ref 0.7–3.1)
Lymphs: 21 %
MCH: 28.6 pg (ref 26.6–33.0)
MCHC: 32.7 g/dL (ref 31.5–35.7)
MCV: 87 fL (ref 79–97)
Monocytes Absolute: 0.8 10*3/uL (ref 0.1–0.9)
Monocytes: 9 %
Neutrophils Absolute: 6 10*3/uL (ref 1.4–7.0)
Neutrophils: 66 %
Platelets: 336 10*3/uL (ref 150–450)
RBC: 4.86 x10E6/uL (ref 4.14–5.80)
RDW: 13.6 % (ref 11.6–15.4)
WBC: 9.1 10*3/uL (ref 3.4–10.8)

## 2024-01-26 LAB — HEMOGLOBIN A1C
Est. average glucose Bld gHb Est-mCnc: 117 mg/dL
Hgb A1c MFr Bld: 5.7 % — ABNORMAL HIGH (ref 4.8–5.6)

## 2024-01-26 LAB — LIPID PANEL
Chol/HDL Ratio: 3.8 ratio (ref 0.0–5.0)
Cholesterol, Total: 139 mg/dL (ref 100–199)
HDL: 37 mg/dL — ABNORMAL LOW (ref >39)
LDL Chol Calc (NIH): 77 mg/dL (ref 0–99)
Triglycerides: 140 mg/dL (ref 0–149)
VLDL Cholesterol Cal: 25 mg/dL (ref 5–40)

## 2024-01-26 LAB — VITAMIN D 25 HYDROXY (VIT D DEFICIENCY, FRACTURES): Vit D, 25-Hydroxy: 44.5 ng/mL (ref 30.0–100.0)

## 2024-01-26 LAB — TSH+FREE T4
Free T4: 1.3 ng/dL (ref 0.82–1.77)
TSH: 0.992 u[IU]/mL (ref 0.450–4.500)

## 2024-02-06 NOTE — Progress Notes (Signed)
 Please inform the patient that his labs indicate he is prediabetic. I recommend decreasing his intake of high-sugar foods and beverages, along with increasing physical activity. All other labs are stable.

## 2024-02-10 ENCOUNTER — Ambulatory Visit: Admitting: Podiatry

## 2024-02-16 ENCOUNTER — Ambulatory Visit (INDEPENDENT_AMBULATORY_CARE_PROVIDER_SITE_OTHER): Admitting: Podiatry

## 2024-02-16 DIAGNOSIS — Z91199 Patient's noncompliance with other medical treatment and regimen due to unspecified reason: Secondary | ICD-10-CM

## 2024-02-16 NOTE — Progress Notes (Signed)
   Complete physical exam  Patient: Scott Ritter   DOB: 08/02/1997   63 y.o. Male  MRN: [id number]  Subjective:    No chief complaint on file.   Scott Ritter is a 63 y.o. male who presents today for a complete physical exam. She reports consuming a {diet types:17450} diet. {types:19826} She generally feels {DESC; WELL/FAIRLY WELL/POORLY:18703}. She reports sleeping {DESC; WELL/FAIRLY WELL/POORLY:18703}. She {does/does not:200015} have additional problems to discuss today.    Most recent fall risk assessment:    04/09/2022   10:42 AM  Fall Risk   Falls in the past year? 0  Number falls in past yr: 0  Injury with Fall? 0  Risk for fall due to : No Fall Risks  Follow up Falls evaluation completed     Most recent depression screenings:    04/09/2022   10:42 AM 02/28/2021   10:46 AM  PHQ 2/9 Scores  PHQ - 2 Score 0 0  PHQ- 9 Score 5     {VISON DENTAL STD PSA (Optional):27386}  {History (Optional):23778}  Patient Care Team: Christen Butter, NP as PCP - General (Nurse Practitioner)   Outpatient Medications Prior to Visit  Medication Sig   fluticasone (FLONASE) 50 MCG/ACT nasal spray Place 2 sprays into both nostrils in the morning and at bedtime. After 7 days, reduce to once daily.   norgestimate-ethinyl estradiol (SPRINTEC 28) 0.25-35 MG-MCG tablet Take 1 tablet by mouth daily.   Nystatin POWD Apply liberally to affected area 2 times per day   spironolactone (ALDACTONE) 100 MG tablet Take 1 tablet (100 mg total) by mouth daily.   No facility-administered medications prior to visit.    ROS        Objective:     There were no vitals taken for this visit. {Vitals History (Optional):23777}  Physical Exam   No results found for any visits on 05/15/22. {Show previous labs (optional):23779}    Assessment & Plan:    Routine Health Maintenance and Physical Exam  Immunization History  Administered Date(s) Administered   DTaP 10/16/1999, 12/12/1999,  02/20/2000, 11/05/2000, 05/21/2004   Hepatitis A 03/17/2008, 03/23/2009   Hepatitis B 08/03/1999, 09/10/1999, 02/20/2000   HiB (PRP-OMP) 10/16/1999, 12/12/1999, 02/20/2000, 11/05/2000   IPV 10/16/1999, 12/12/1999, 08/10/2000, 05/21/2004   Influenza,inj,Quad PF,6+ Mos 06/23/2014   Influenza-Unspecified 09/22/2012   MMR 08/10/2001, 05/21/2004   Meningococcal Polysaccharide 03/22/2012   Pneumococcal Conjugate-13 11/05/2000   Pneumococcal-Unspecified 02/20/2000, 05/05/2000   Tdap 03/22/2012   Varicella 08/10/2000, 03/17/2008    Health Maintenance  Topic Date Due   HIV Screening  Never done   Hepatitis C Screening  Never done   INFLUENZA VACCINE  05/13/2022   PAP-Cervical Cytology Screening  05/15/2022 (Originally 08/01/2020)   PAP SMEAR-Modifier  05/15/2022 (Originally 08/01/2020)   TETANUS/TDAP  05/15/2022 (Originally 03/22/2022)   HPV VACCINES  Discontinued   COVID-19 Vaccine  Discontinued    Discussed health benefits of physical activity, and encouraged her to engage in regular exercise appropriate for her age and condition.  Problem List Items Addressed This Visit   None Visit Diagnoses     Annual physical exam    -  Primary   Cervical cancer screening       Need for Tdap vaccination          No follow-ups on file.     Christen Butter, NP

## 2024-02-17 ENCOUNTER — Ambulatory Visit: Admitting: Podiatry

## 2024-02-17 ENCOUNTER — Ambulatory Visit (INDEPENDENT_AMBULATORY_CARE_PROVIDER_SITE_OTHER)

## 2024-02-17 VITALS — Ht 67.0 in | Wt 192.0 lb

## 2024-02-17 DIAGNOSIS — Z0000 Encounter for general adult medical examination without abnormal findings: Secondary | ICD-10-CM | POA: Diagnosis not present

## 2024-02-17 DIAGNOSIS — Z0100 Encounter for examination of eyes and vision without abnormal findings: Secondary | ICD-10-CM

## 2024-02-17 NOTE — Progress Notes (Signed)
 Please attest and cosign this visit due to patients primary care provider not being in the office at the time the visit was completed.   Subjective:   Scott Ritter is a 63 y.o. who presents for a Medicare Wellness preventive visit.  Visit Complete: Virtual I connected with  Scott Ritter on 02/17/24 by a audio enabled telemedicine application and verified that I am speaking with the correct person using two identifiers.  Patient Location: Home  Provider Location: Home Office  I discussed the limitations of evaluation and management by telemedicine. The patient expressed understanding and agreed to proceed.  Vital Signs: Because this visit was a virtual/telehealth visit, some criteria may be missing or patient reported. Any vitals not documented were not able to be obtained and vitals that have been documented are patient reported.  VideoDeclined- This patient declined Librarian, academic. Therefore the visit was completed with audio only.  Persons Participating in Visit: Patient.  AWV Questionnaire: No: Patient Medicare AWV questionnaire was not completed prior to this visit.  Cardiac Risk Factors include: advanced age (>24men, >64 women);male gender;hypertension;obesity (BMI >30kg/m2);Other (see comment), Risk factor comments: history of stroke     Objective:    Today's Vitals   02/17/24 1509 02/17/24 1510  Weight: 192 lb (87.1 kg)   Height: 5\' 7"  (1.702 m)   PainSc:  10-Worst pain ever   Body mass index is 30.07 kg/m.     02/17/2024    3:13 PM 08/19/2023   11:23 AM 02/03/2022    8:03 AM 12/20/2021    8:16 AM 10/23/2021   12:00 PM 04/09/2021    6:57 AM 01/31/2021    8:24 AM  Advanced Directives  Does Patient Have a Medical Advance Directive? No No No No No No No  Would patient like information on creating a medical advance directive? No - Patient declined No - Patient declined Yes (ED - Information included in AVS) No - Patient declined  No - Patient declined No - Patient declined No - Patient declined    Current Medications (verified) Outpatient Encounter Medications as of 02/17/2024  Medication Sig   amLODipine  (NORVASC ) 10 MG tablet TAKE 1 TABLET EVERY DAY   aspirin  EC 81 MG EC tablet Take 1 tablet (81 mg total) by mouth daily. Swallow whole.   atorvastatin  (LIPITOR) 80 MG tablet TAKE 1 TABLET (80 MG TOTAL) BY MOUTH DAILY.   Cholecalciferol  (VITAMIN D ) 2000 units CAPS Take 2,000 Units by mouth daily.   Evolocumab  (REPATHA  SURECLICK) 140 MG/ML SOAJ Inject 140 mg into the skin every 14 (fourteen) days.   ezetimibe  (ZETIA ) 10 MG tablet Take 1 tablet (10 mg total) by mouth daily.   gabapentin  (NEURONTIN ) 300 MG capsule Take 1 capsule (300 mg total) by mouth 2 (two) times daily.   hydrALAZINE  (APRESOLINE ) 100 MG tablet TAKE 1 TABLET THREE TIMES DAILY   losartan -hydrochlorothiazide (HYZAAR) 100-25 MG tablet TAKE 1 TABLET EVERY DAY   metoprolol  tartrate (LOPRESSOR ) 25 MG tablet TAKE 1 TABLET TWICE DAILY   potassium chloride  SA (KLOR-CON  M) 20 MEQ tablet TAKE 1 TABLET TWICE DAILY   No facility-administered encounter medications on file as of 02/17/2024.    Allergies (verified) Patient has no known allergies.   History: Past Medical History:  Diagnosis Date   GERD (gastroesophageal reflux disease)    Hypertension    Stroke (HCC)    left side deficits   Past Surgical History:  Procedure Laterality Date   BACK SURGERY  BIOPSY  09/17/2018   Procedure: BIOPSY;  Surgeon: Alyce Jubilee, MD;  [facility];  Service: Endoscopy;;  duodenal biopsy    COLONOSCOPY N/A 02/29/2016   Dr. Nolene Baumgarten: redundant colon, non-bleeding internal hemorrhoids    COLONOSCOPY WITH PROPOFOL  N/A 04/09/2021   Procedure: COLONOSCOPY WITH PROPOFOL ;  Surgeon: Vinetta Greening, DO;  Location: [facility];  Service: Endoscopy;  Laterality: N/A;  7:30am   ESOPHAGOGASTRODUODENOSCOPY N/A 12/17/2016   Dr. Nolene Baumgarten: 3 mm nonbleeding  Mallory-Weiss tear.  EGD performed for hematemesis.  Hemoglobin normal.   ESOPHAGOGASTRODUODENOSCOPY N/A 02/19/2018   Dr. Riley Cheadle: Performed for melena, hemoglobin of 6.  Mucosal changes in the esophagus query short segment Barrett's, biopsy more consistent with reflux changes.  Erosive gastropathy but no H. pylori.  Duodenal erosions.  Suspected NSAID induced injury.   ESOPHAGOGASTRODUODENOSCOPY  08/18/2018   Dr. Homero Luster: IDA/heme positive stool.  Esophageal mucosal changes consistent with short segment Barrett's esophagus, not biopsied.  2 cm hiatal hernia.  Duodenal bulb, second portion of duodenum, third portion of the duodenum.  Video capsule somewhat difficult to pass through the oropharynx but was eventually advanced into the second portion of the duodenum and released.   ESOPHAGOGASTRODUODENOSCOPY N/A 08/18/2018   Procedure: ESOPHAGOGASTRODUODENOSCOPY (EGD);  Surgeon: Ruby Corporal, MD;  [facility];  Service: Endoscopy;  Laterality: N/A;   ESOPHAGOGASTRODUODENOSCOPY N/A 09/17/2018   gastritis, 2 nonbleeding angiectasia's in the duodenum were treated with [facility] coagulation status post biopsy.   FOOT SURGERY Left    GIVENS CAPSULE STUDY N/A 08/18/2018   Procedure: GIVENS CAPSULE STUDY;  Surgeon: Ruby Corporal, MD;  [facility];  Service: Endoscopy;  Laterality: N/A;   HERNIA REPAIR     POLYPECTOMY  04/09/2021   Procedure: POLYPECTOMY;  Surgeon: Vinetta Greening, DO;  [facility];  Service: Endoscopy;;  descending; cecal   Family History  Problem Relation Age of Onset   Hypertension Mother    Hypertension Father    Colon cancer Father    Colon polyps Neg Hx    Social History   Socioeconomic History   Marital status: Divorced    Spouse name: Not on file   Number of children: 2   Years of education: Not on file   Highest education level: Not on file  Occupational History   Not on file  Tobacco Use   Smoking status: Never   Smokeless  tobacco: Never  Vaping Use   Vaping status: Never Used  Substance and Sexual Activity   Alcohol use: Yes    Alcohol/week: 1.0 standard drink of alcohol    Types: 1 Cans of beer per week    Comment: occasionally; once a week or once a month   Drug use: No   Sexual activity: Yes    Comment: not married hs sexual partner  Other Topics Concern   Not on file  Social History Narrative   PT lives with his mother, he is on disability for his stroke.    Social Drivers of Corporate investment banker Strain: Low Risk  (02/17/2024)   Overall Financial Resource Strain (CARDIA)    Difficulty of Paying Living Expenses: Not hard at all  Food Insecurity: No Food Insecurity (02/17/2024)   Hunger Vital Sign    Worried About Running Out of Food in the Last Year: Never true    Ran Out of Food in the Last Year: Never true  Transportation Needs: No Transportation Needs (02/17/2024)  PRAPARE - Administrator, Civil Service (Medical): No    Lack of Transportation (Non-Medical): No  Physical Activity: Sufficiently Active (02/17/2024)   Exercise Vital Sign    Days of Exercise per Week: 7 days    Minutes of Exercise per Session: 30 min  Recent Concern: Physical Activity - Insufficiently Active (12/23/2023)   Exercise Vital Sign    Days of Exercise per Week: 2 days    Minutes of Exercise per Session: 50 min  Stress: No Stress Concern Present (02/17/2024)   [employer] of Occupational Health - Occupational Stress Questionnaire    Feeling of Stress : Not at all  Social Connections: Moderately Isolated (02/17/2024)   Social Connection and Isolation Panel [NHANES]    Frequency of Communication with Friends and Family: More than three times a week    Frequency of Social Gatherings with Friends and Family: Twice a week    Attends Religious Services: More than 4 times per year    Active Member of [employer] or Organizations: No    Attends Engineer, structural: Never    Marital Status: Divorced     Tobacco Counseling Counseling given: Yes    Clinical Intake:  Pre-visit preparation completed: Yes  Pain : 0-10 Pain Score: 10-Worst pain ever Pain Type: Chronic pain Pain Location: Foot (pt states it's on the "ball of my left foot") Pain Orientation: Left Pain Descriptors / Indicators: Constant, Throbbing Pain Onset: More than a month ago Pain Frequency: Constant     BMI - recorded: 30.07 Nutritional Risks: None Diabetes: No  Lab Results  Component Value Date   HGBA1C 5.7 (H) 01/25/2024   HGBA1C 5.6 09/24/2023   HGBA1C 5.5 05/25/2023     How often do you need to have someone help you when you read instructions, pamphlets, or other written materials from your doctor or pharmacy?: 1 - Never  Interpreter Needed?: No  Information entered by :: Sally Crazier CMA   Activities of Daily Living     02/17/2024    3:12 PM  In your present state of health, do you have any difficulty performing the following activities:  Hearing? 0  Vision? 0  Difficulty concentrating or making decisions? 0  Walking or climbing stairs? 1  Comment chronic left foot pain  Dressing or bathing? 0  Doing errands, shopping? 0  Preparing Food and eating ? N  Using the Toilet? N  In the past six months, have you accidently leaked urine? N  Do you have problems with loss of bowel control? N  Managing your Medications? N  Managing your Finances? N  Housekeeping or managing your Housekeeping? N    Patient Care Team: Zarwolo, Gloria, FNP as PCP - General (Family Medicine) Jane Meager, MD as Consulting Physician (Nephrology)  Indicate any recent Medical Services you may have received from other than [facility] providers in the past year (date may be approximate).     Assessment:   This is a routine wellness examination for Scott Ritter.  Hearing/Vision screen Hearing Screening - Comments:: Patient denies any hearing difficulties.   Vision Screening - Comments:: Patient is not up to date on  yearly eye exams.  Referral to ophthalmology placed.     Goals Addressed             This Visit's Progress    Exercise 3x per week (30 min per time)   On track    Currently going to [facility] doing water  aerobics 2-3 times per week  Patient Stated   On track    I would like for my foot pain under control so that I can walk longer distances      Patient Stated   On track    Patient states that his goal is to be able to walk a little better.     Patient Stated       I'd like to go on a vacation       Depression Screen     02/17/2024    3:14 PM 09/24/2023    8:11 AM 05/25/2023    8:07 AM 04/15/2023    8:39 AM 02/09/2023    8:10 AM 01/22/2023    8:45 AM 09/08/2022   10:58 AM  PHQ 2/9 Scores  PHQ - 2 Score 0 0 0 0 0 0 0  PHQ- 9 Score 0 0 0 0 0 0 0    Fall Risk     02/17/2024    3:14 PM 01/25/2024    8:29 AM 09/24/2023    8:11 AM 05/25/2023    8:07 AM 04/15/2023    8:39 AM  Fall Risk   Falls in the past year? 0 0 0 0 0  Number falls in past yr: 0 0 0 0 0  Injury with Fall? 0 0 0 0 0  Risk for fall due to : No Fall Risks Impaired balance/gait No Fall Risks No Fall Risks No Fall Risks  Follow up Falls prevention discussed;Falls evaluation completed Falls evaluation completed Falls evaluation completed Falls evaluation completed Falls evaluation completed    MEDICARE RISK AT HOME:  Medicare Risk at Home Any stairs in or around the home?: Yes If so, are there any without handrails?: No Home free of loose throw rugs in walkways, pet beds, electrical cords, etc?: Yes Adequate lighting in your home to reduce risk of falls?: Yes Life alert?: No Use of a cane, walker or w/c?: Yes (uses cane when he walks long distances, but not when he is around the house) Grab bars in the bathroom?: Yes Shower chair or bench in shower?: No Elevated toilet seat or a handicapped toilet?: No  TIMED UP AND GO:  Was the test performed?  No  Cognitive Function: 6CIT completed        02/17/2024     3:14 PM 02/03/2022    8:06 AM 10/20/2019    9:16 AM  6CIT Screen  What Year? 0 points 0 points 0 points  What month? 0 points 0 points 0 points  What time? 0 points 0 points 0 points  Count back from 20 0 points 0 points 0 points  Months in reverse 0 points 2 points 4 points  Repeat phrase 0 points 0 points 4 points  Total Score 0 points 2 points 8 points    Immunizations Immunization History  Administered Date(s) Administered   Influenza,inj,Quad PF,6+ Mos 12/17/2016, 08/24/2019, 08/07/2020, 09/16/2021   Janssen (J&J) SARS-COV-2 Vaccination 01/23/2020   Moderna Covid-19 Vaccine Bivalent Booster 16yrs & up 03/20/2021   Moderna SARS-COV2 Booster Vaccination 09/11/2020, 09/02/2021   Moderna Sars-Covid-2 Vaccination 07/24/2020   Pneumococcal Conjugate-13 08/16/2018   Tdap 09/22/2022   Zoster Recombinant(Shingrix) 10/31/2021, 01/15/2022    Screening Tests Health Maintenance  Topic Date Due   COVID-19 Vaccine (6 - 2024-25 season) 06/14/2023   INFLUENZA VACCINE  05/13/2024   Medicare Annual Wellness (AWV)  02/16/2025   Colonoscopy  04/09/2026   DTaP/Tdap/Td (2 - Td or Tdap) 09/22/2032   Hepatitis C Screening  Completed  HIV Screening  Completed   Zoster Vaccines- Shingrix  Completed   Pneumococcal Vaccine 57-69 Years old  Aged Out   HPV VACCINES  Aged Out   Meningococcal B Vaccine  Aged Out    Health Maintenance  Health Maintenance Due  Topic Date Due   COVID-19 Vaccine (6 - 2024-25 season) 06/14/2023   Health Maintenance Items Addressed: Patient advised of recommended vaccines and where to obtain those vaccines with verbal understanding  Additional Screening:  Vision Screening: Recommended annual ophthalmology exams for early detection of glaucoma and other disorders of the eye. Referral to ophthalmology placed.    Dental Screening: Recommended annual dental exams for proper oral hygiene  Community Resource Referral / Chronic Care Management: CRR required this  visit?  No   CCM required this visit?  No     Plan:     I have personally reviewed and noted the following in the patient's chart:   Medical and social history Use of alcohol, tobacco or illicit drugs  Current medications and supplements including opioid prescriptions. Patient is not currently taking opioid prescriptions. Functional ability and status Nutritional status Physical activity Advanced directives List of other physicians Hospitalizations, surgeries, and ER visits in previous 12 months Vitals Screenings to include cognitive, depression, and falls Referrals and appointments  In addition, I have reviewed and discussed with patient certain preventive protocols, quality metrics, and best practice recommendations. A written personalized care plan for preventive services as well as general preventive health recommendations were provided to patient.      Austan Nicholl, CMA   02/17/2024   After Visit Summary: (Mail) Due to this being a telephonic visit, the after visit summary with patients personalized plan was offered to patient via mail   Notes: Please refer to Routing Comments.

## 2024-02-17 NOTE — Patient Instructions (Signed)
 [name] , Thank you for taking time to come for your Medicare Wellness Visit. I appreciate your ongoing commitment to your health goals. Please review the following plan we discussed and let me know if I can assist you in the future.   Referrals/Orders/Follow-Ups/Clinician Recommendations:    Next Medicare AWV: Feb 21, 2025 at 9:20 am telephone visit.   You've been referred to see Dr. Cindra Cree for an eye exam. If you haven't heard from his office in about a week, please call to schedule your appointment.   Cindra Cree, MD [address]  [place] [address]  Phone: [phone]      Aim for 30 minutes of exercise or brisk walking, 6-8 glasses of water , and 5 servings of fruits and vegetables each day.   This is a list of the screening recommended for you and due dates:  Health Maintenance  Topic Date Due   COVID-19 Vaccine (6 - 2024-25 season) 06/14/2023   Flu Shot  05/13/2024   Medicare Annual Wellness Visit  02/16/2025   Colon Cancer Screening  04/09/2026   DTaP/Tdap/Td vaccine (2 - Td or Tdap) 09/22/2032   Hepatitis C Screening  Completed   HIV Screening  Completed   Zoster (Shingles) Vaccine  Completed   Pneumococcal Vaccination  Aged Out   HPV Vaccine  Aged Out   Meningitis B Vaccine  Aged Out    Advanced directives: (Declined) Advance directive discussed with you today. Even though you declined this today, please call our office should you change your mind, and we can give you the proper paperwork for you to fill out. Advance Care Planning is important because it:  [x]  Makes sure you receive the medical care that is consistent with your values, goals, and preferences  [x]  It provides guidance to your family and loved ones and it also reduces their decisional burden about whether or not they are making the right decisions based on what you want done  Follow the link provided in your after visit summary or read over the paperwork we have mailed to you to help you  started getting your Advance Directives in place. If you need assistance in completing these, please reach out to us  so that we can help you!  Next Medicare Annual Wellness Visit scheduled for next year: yes  Understanding Your Risk for Falls Millions of people have serious injuries from falls each year. It is important to understand your risk of falling. Talk with your health care provider about your risk and what you can do to lower it. If you do have a serious fall, make sure to tell your provider. Falling once raises your risk of falling again. How can falls affect me? Serious injuries from falls are common. These include: Broken bones, such as hip fractures. Head injuries, such as traumatic brain injuries (TBI) or concussions. A fear of falling can cause you to avoid activities and stay at home. This can make your muscles weaker and raise your risk for a fall. What can increase my risk? There are a number of risk factors that increase your risk for falling. The more risk factors you have, the higher your risk of falling. Serious injuries from a fall happen most often to people who are older than 61 years old. Teenagers and young adults ages 73-29 are also at higher risk. Common risk factors include: Weakness in the lower body. Being generally weak or confused due to long-term (chronic) illness. Dizziness or balance problems. Poor vision.  Medicines that cause dizziness or drowsiness. These may include: Medicines for your blood pressure, heart, anxiety, insomnia, or swelling (edema). Pain medicines. Muscle relaxants. Other risk factors include: Drinking alcohol. Having had a fall in the past. Having foot pain or wearing improper footwear. Working at a dangerous job. Having any of the following in your home: Tripping hazards, such as floor clutter or loose rugs. Poor lighting. Pets. Having dementia or memory loss. What actions can I take to lower my risk of falling?  Physical  activity Stay physically fit. Do strength and balance exercises. Consider taking a regular class to build strength and balance. Yoga and tai chi are good options. Vision Have your eyes checked every year and your prescription for glasses or contacts updated as needed. Shoes and walking aids Wear non-skid shoes. Wear shoes that have rubber soles and low heels. Do not wear high heels. Do not walk around the house in socks or slippers. Use a cane or walker as told by your provider. Home safety Attach secure railings on both sides of your stairs. Install grab bars for your bathtub, shower, and toilet. Use a non-skid mat in your bathtub or shower. Attach bath mats securely with double-sided, non-slip rug tape. Use good lighting in all rooms. Keep a flashlight near your bed. Make sure there is a clear path from your bed to the bathroom. Use night-lights. Do not use throw rugs. Make sure all carpeting is taped or tacked down securely. Remove all clutter from walkways and stairways, including extension cords. Repair uneven or broken steps and floors. Avoid walking on icy or slippery surfaces. Walk on the grass instead of on icy or slick sidewalks. Use ice melter to get rid of ice on walkways in the winter. Use a cordless phone. Questions to ask your health care provider Can you help me check my risk for a fall? Do any of my medicines make me more likely to fall? Should I take a vitamin D  supplement? What exercises can I do to improve my strength and balance? Should I make an appointment to have my vision checked? Do I need a bone density test to check for weak bones (osteoporosis)? Would it help to use a cane or a walker? Where to find more information Centers for Disease Control and Prevention, STEADI: TonerPromos.no Community-Based Fall Prevention Programs: TonerPromos.no [employer] on Aging: BaseRingTones.pl Contact a health care provider if: You fall at home. You are afraid of falling at  home. You feel weak, drowsy, or dizzy. This information is not intended to replace advice given to you by your health care provider. Make sure you discuss any questions you have with your health care provider. Document Revised: 06/02/2022 Document Reviewed: 06/02/2022 Elsevier Patient Education  2024 [employer].

## 2024-02-19 ENCOUNTER — Encounter

## 2024-02-24 ENCOUNTER — Encounter: Payer: Self-pay | Admitting: Podiatry

## 2024-02-24 ENCOUNTER — Ambulatory Visit: Admitting: Podiatry

## 2024-02-24 ENCOUNTER — Ambulatory Visit: Payer: Self-pay | Admitting: *Deleted

## 2024-02-24 VITALS — Ht 67.0 in | Wt 192.0 lb

## 2024-02-24 DIAGNOSIS — M7752 Other enthesopathy of left foot: Secondary | ICD-10-CM

## 2024-02-24 NOTE — Patient Instructions (Signed)
Look for Voltaren gel at the pharmacy over the counter or online (also known as diclofenac 1% gel). Apply to the painful areas 3-4x daily with the supplied dosing card. Allow to dry for 10 minutes before going into socks/shoes  

## 2024-02-26 ENCOUNTER — Telehealth: Payer: Self-pay | Admitting: Family Medicine

## 2024-02-26 NOTE — Telephone Encounter (Signed)
 Patient here requesting refill on oxycodone - says to call him with any questions or concerns. Thanks

## 2024-02-28 NOTE — Progress Notes (Signed)
  Subjective:  Patient ID: Scott Ritter, male    DOB: [birth year],  MRN: [id number]  Chief Complaint  Patient presents with   Foot Pain    Capsulitis of metatarsophalangeal (MTP) joint of left foot Would like 2nd opinion    63 y.o. male presents with the above complaint. History confirmed with patient.  He is here today for a second opinion on his upcoming surgery with Dr. Celia Coles.  This been ongoing for some time injections and offloading and shoe gear changes have not alleviated his pain.  Objective:  Physical Exam: warm, good capillary refill, no trophic changes or ulcerative lesions, normal DP and PT pulses, normal sensory exam, and pain and tenderness on the plantar MTP joint, no instability to do note plantar plate tear and no evidence of adjacent neuroma   Radiographs: Multiple views x-ray of the left foot previously taken in September 2024: Elongated second metatarsal no other acute osseous abnormalities Assessment:   1. Capsulitis of metatarsophalangeal (MTP) joint of left foot      Plan:  Patient was evaluated and treated and all questions answered.  We reviewed his progress in the proposed surgical plan of shortening osteotomy of the second metatarsal.  I explained the rationale for the procedure as well as the risks and benefits.  All questions were addressed.  I agree with the surgical plan as proposed.  He will follow-up with Dr. Celia Coles after surgery for his postoperative care.  No follow-ups on file.

## 2024-02-29 ENCOUNTER — Telehealth: Payer: Self-pay

## 2024-02-29 NOTE — Telephone Encounter (Signed)
 Copied from CRM [id number]. Topic: Clinical - Medication Question >> Feb 29, 2024  9:52 AM Baldo Levan wrote: Reason for CRM: Patient called in requesting his pain medication to be refilled today as he is in a lot of pain. Patient dropped it off on 05/16 I did let him know it could take up to 3 Business days.

## 2024-03-01 ENCOUNTER — Telehealth: Payer: Self-pay

## 2024-03-01 ENCOUNTER — Other Ambulatory Visit: Payer: Self-pay | Admitting: Family Medicine

## 2024-03-01 DIAGNOSIS — G8929 Other chronic pain: Secondary | ICD-10-CM

## 2024-03-01 MED ORDER — OXYCODONE-ACETAMINOPHEN 5-325 MG PO TABS: 1.0000 | ORAL_TABLET | ORAL | 0 refills | Status: AC | PRN

## 2024-03-01 NOTE — Telephone Encounter (Signed)
 Duplicate message, previous message sent to provider to review, once Art Bigness responds we will inform patient

## 2024-03-01 NOTE — Telephone Encounter (Signed)
 Asking for oxycodone  refill. He seen Podiatry on 5/14. Pls advise

## 2024-03-01 NOTE — Telephone Encounter (Signed)
 Copied from CRM [id number]. Topic: Clinical - Prescription Issue >> Mar 01, 2024  3:36 PM Sasha H wrote: Reason for CRM: Pt is wanting to know when will his Oxycodone  as his foot is hurting. Pt would like a call back

## 2024-03-02 NOTE — Telephone Encounter (Signed)
 Patient advised.

## 2024-03-03 ENCOUNTER — Telehealth: Payer: Self-pay | Admitting: Podiatry

## 2024-03-03 NOTE — Telephone Encounter (Signed)
 Called pt to confirm he was wanting to continue with surgery as I had received a message that he was wanting a second opinion.  Pt stated he got the 2nd opinion and is wanting to proceed with the surgery.   I told pt we are trying to get authorization from the insurance and will call pt to confirm everything once we get that. He stated Elihu Grumet has been trying to [facility] of Dr Celia Coles.

## 2024-03-08 ENCOUNTER — Telehealth: Payer: Self-pay | Admitting: Podiatry

## 2024-03-08 NOTE — Telephone Encounter (Signed)
 DOS: 03/15/24  LEFT 2ND METATARSAL OSTEOTOMY -91478      EFFECTIVE DATE :   01/12/2024   OOP:   $6,750.00  REMAINING:  $6,750.00   COINSURANCE:   0%    PER [employer] CPT CODE 29562 IS APPROVED   Authorization #[id number]

## 2024-03-09 ENCOUNTER — Other Ambulatory Visit: Payer: Self-pay | Admitting: Family Medicine

## 2024-03-09 ENCOUNTER — Encounter: Admitting: Podiatry

## 2024-03-09 DIAGNOSIS — E782 Mixed hyperlipidemia: Secondary | ICD-10-CM

## 2024-03-09 DIAGNOSIS — I1310 Hypertensive heart and chronic kidney disease without heart failure, with stage 1 through stage 4 chronic kidney disease, or unspecified chronic kidney disease: Secondary | ICD-10-CM

## 2024-03-14 ENCOUNTER — Telehealth: Payer: Self-pay | Admitting: Podiatry

## 2024-03-14 NOTE — Telephone Encounter (Signed)
 Pt called back and stated that [employer] was going to help pt pay the money due but could not pay until the day after surgery.  I explained that the surgery center requires the money up front before the surgery. He said they were kind of rude to him.  He does want to get the surgery done.

## 2024-03-14 NOTE — Telephone Encounter (Signed)
 Received call from [facility] and they said pt wants to cancel surgery.  I called pt and pt stated he could not afford to have the surgery done at this time. He will call back when he has the money to pay for it.

## 2024-03-18 ENCOUNTER — Telehealth: Payer: Self-pay

## 2024-03-18 ENCOUNTER — Other Ambulatory Visit: Payer: Self-pay

## 2024-03-18 DIAGNOSIS — G8929 Other chronic pain: Secondary | ICD-10-CM

## 2024-03-18 MED ORDER — GABAPENTIN 300 MG PO CAPS: 300.0000 mg | ORAL_CAPSULE | Freq: Two times a day (BID) | ORAL | 1 refills | Status: AC

## 2024-03-18 NOTE — Telephone Encounter (Signed)
 Med refilled.

## 2024-03-18 NOTE — Telephone Encounter (Signed)
 Copied from CRM [id number]. Topic: Clinical - Medication Question >> Mar 18, 2024  8:28 AM Scott Ritter wrote: Reason for CRM: Scott Ritter from [facility] is calling to request a refill for gabapentin  (NEURONTIN ) 300 MG capsule.    Pharmacy Details [facility] - [place], [place] - [address] [place] [address] Phone: [phone] Fax: [phone]

## 2024-03-21 ENCOUNTER — Encounter: Admitting: Podiatry

## 2024-03-25 ENCOUNTER — Telehealth: Payer: Self-pay | Admitting: Family Medicine

## 2024-03-25 ENCOUNTER — Telehealth: Payer: Self-pay

## 2024-03-25 NOTE — Telephone Encounter (Signed)
 Patient here needing refill on Oxycodone  sent to [facility] in [place], any questions please call pt. Thanks

## 2024-03-25 NOTE — Telephone Encounter (Signed)
 Copied from CRM [id number]. Topic: Clinical - Prescription Issue >> Mar 25, 2024  3:02 PM Scott Ritter wrote: Reason for CRM: Patient called. States he came by this morning to drop off a Rx he needs refilled. Would like an update before the weekend. Has not heard a response. Thank You

## 2024-03-28 NOTE — Telephone Encounter (Signed)
Waiting on provider response

## 2024-03-29 ENCOUNTER — Telehealth: Payer: Self-pay

## 2024-03-29 NOTE — Telephone Encounter (Signed)
 Copied from CRM [id number]. Topic: Clinical - Prescription Issue >> Mar 29, 2024 11:25 AM Georgeann Kindred wrote: Reason for CRM: Patient calling requesting an update on prescription refill of oxyCODONE . Please contact patient at [phone].

## 2024-03-31 ENCOUNTER — Other Ambulatory Visit: Payer: Self-pay | Admitting: Family Medicine

## 2024-03-31 DIAGNOSIS — G8929 Other chronic pain: Secondary | ICD-10-CM

## 2024-04-01 MED ORDER — OXYCODONE-ACETAMINOPHEN 5-325 MG PO TABS: 1.0000 | ORAL_TABLET | ORAL | 0 refills | Status: AC | PRN

## 2024-04-01 NOTE — Telephone Encounter (Signed)
 Patient advised.

## 2024-04-04 ENCOUNTER — Encounter: Admitting: Podiatry

## 2024-04-08 ENCOUNTER — Other Ambulatory Visit: Payer: Self-pay | Admitting: Family Medicine

## 2024-04-08 MED ORDER — OXYCODONE-ACETAMINOPHEN 5-325 MG PO TABS: 1.0000 | ORAL_TABLET | Freq: Three times a day (TID) | ORAL | 0 refills | Status: DC | PRN

## 2024-05-11 ENCOUNTER — Other Ambulatory Visit: Payer: Self-pay | Admitting: Family Medicine

## 2024-05-11 DIAGNOSIS — I639 Cerebral infarction, unspecified: Secondary | ICD-10-CM

## 2024-05-11 DIAGNOSIS — E782 Mixed hyperlipidemia: Secondary | ICD-10-CM

## 2024-05-11 NOTE — Telephone Encounter (Signed)
 Copied from CRM [id number]. Topic: Clinical - Medication Refill >> May 11, 2024 10:22 AM Carlatta H wrote: Medication: ezetimibe  (ZETIA ) 10 MG tablet [[id number]]  Has the patient contacted their pharmacy? No (Agent: If no, request that the patient contact the pharmacy for the refill. If patient does not wish to contact the pharmacy document the reason why and proceed with request.) (Agent: If yes, when and what did the pharmacy advise?)  This is the patient's preferred pharmacy:    [facility] - [place], [place] - [address] [place] [address] Phone: [phone] Fax: [phone]  Is this the correct pharmacy for this prescription? Yes If no, delete pharmacy and type the correct one.   Has the prescription been filled recently? No  Is the patient out of the medication? Yes  Has the patient been seen for an appointment in the last year OR does the patient have an upcoming appointment? Yes  Can we respond through MyChart? No  Agent: Please be advised that Rx refills may take up to 3 business days. We ask that you follow-up with your pharmacy.

## 2024-05-18 ENCOUNTER — Other Ambulatory Visit: Payer: Self-pay | Admitting: Family Medicine

## 2024-05-18 DIAGNOSIS — E782 Mixed hyperlipidemia: Secondary | ICD-10-CM

## 2024-05-18 DIAGNOSIS — I639 Cerebral infarction, unspecified: Secondary | ICD-10-CM

## 2024-05-18 NOTE — Telephone Encounter (Signed)
 Copied from CRM #[id number]. Topic: Clinical - Medication Refill >> May 18, 2024 11:52 AM Nathanel BROCKS wrote: Medication: ezetimibe  (ZETIA ) 10 MG tablet  Has the patient contacted their pharmacy? Yes (Agent: If no, request that the patient contact the pharmacy for the refill. If patient does not wish to contact the pharmacy document the reason why and proceed with request.) (Agent: If yes, when and what did the pharmacy advise?)  This is the patient's preferred pharmacy:  [facility] - [place], [place] - [address] [place] [address] Phone: [phone] Fax: [phone]  Is this the correct pharmacy for this prescription? Yes If no, delete pharmacy and type the correct one.   Has the prescription been filled recently? Yes  Is the patient out of the medication? Yes  Has the patient been seen for an appointment in the last year OR does the patient have an upcoming appointment? Yes  Can we respond through MyChart? No  Agent: Please be advised that Rx refills may take up to 3 business days. We ask that you follow-up with your pharmacy.

## 2024-05-20 MED ORDER — EZETIMIBE 10 MG PO TABS: 10.0000 mg | ORAL_TABLET | Freq: Every day | ORAL | 3 refills | Status: AC

## 2024-05-25 ENCOUNTER — Emergency Department (HOSPITAL_COMMUNITY)

## 2024-05-25 ENCOUNTER — Other Ambulatory Visit: Payer: Self-pay

## 2024-05-25 ENCOUNTER — Encounter (HOSPITAL_COMMUNITY): Payer: Self-pay

## 2024-05-25 ENCOUNTER — Emergency Department (HOSPITAL_COMMUNITY)
Admission: EM | Admit: 2024-05-25 | Discharge: 2024-05-25 | Disposition: A | Discharge status: Home / Self Care | Attending: Emergency Medicine | Admitting: Emergency Medicine

## 2024-05-25 DIAGNOSIS — M25572 Pain in left ankle and joints of left foot: Secondary | ICD-10-CM | POA: Diagnosis not present

## 2024-05-25 DIAGNOSIS — Z7982 Long term (current) use of aspirin: Secondary | ICD-10-CM | POA: Diagnosis not present

## 2024-05-25 DIAGNOSIS — L03116 Cellulitis of left lower limb: Secondary | ICD-10-CM | POA: Diagnosis not present

## 2024-05-25 DIAGNOSIS — H11439 Conjunctival hyperemia, unspecified eye: Secondary | ICD-10-CM | POA: Diagnosis not present

## 2024-05-25 DIAGNOSIS — M7989 Other specified soft tissue disorders: Secondary | ICD-10-CM | POA: Diagnosis not present

## 2024-05-25 DIAGNOSIS — N189 Chronic kidney disease, unspecified: Secondary | ICD-10-CM | POA: Insufficient documentation

## 2024-05-25 DIAGNOSIS — R600 Localized edema: Secondary | ICD-10-CM | POA: Diagnosis not present

## 2024-05-25 DIAGNOSIS — D72829 Elevated white blood cell count, unspecified: Secondary | ICD-10-CM | POA: Diagnosis not present

## 2024-05-25 DIAGNOSIS — Z79899 Other long term (current) drug therapy: Secondary | ICD-10-CM | POA: Insufficient documentation

## 2024-05-25 DIAGNOSIS — M79605 Pain in left leg: Secondary | ICD-10-CM | POA: Diagnosis not present

## 2024-05-25 DIAGNOSIS — I129 Hypertensive chronic kidney disease with stage 1 through stage 4 chronic kidney disease, or unspecified chronic kidney disease: Secondary | ICD-10-CM | POA: Insufficient documentation

## 2024-05-25 DIAGNOSIS — R531 Weakness: Secondary | ICD-10-CM | POA: Diagnosis not present

## 2024-05-25 DIAGNOSIS — E876 Hypokalemia: Secondary | ICD-10-CM | POA: Diagnosis not present

## 2024-05-25 DIAGNOSIS — M79672 Pain in left foot: Secondary | ICD-10-CM | POA: Insufficient documentation

## 2024-05-25 LAB — CBC WITH DIFFERENTIAL/PLATELET
Abs Immature Granulocytes: 0.03 K/uL (ref 0.00–0.07)
Basophils Absolute: 0.1 K/uL (ref 0.0–0.1)
Basophils Relative: 1 %
Eosinophils Absolute: 0.2 K/uL (ref 0.0–0.5)
Eosinophils Relative: 2 %
HCT: 42 % (ref 39.0–52.0)
Hemoglobin: 13.6 g/dL (ref 13.0–17.0)
Immature Granulocytes: 0 %
Lymphocytes Relative: 19 %
Lymphs Abs: 2.2 K/uL (ref 0.7–4.0)
MCH: 29 pg (ref 26.0–34.0)
MCHC: 32.4 g/dL (ref 30.0–36.0)
MCV: 89.6 fL (ref 80.0–100.0)
Monocytes Absolute: 1 K/uL (ref 0.1–1.0)
Monocytes Relative: 9 %
Neutro Abs: 8.2 K/uL — ABNORMAL HIGH (ref 1.7–7.7)
Neutrophils Relative %: 69 %
Platelets: 278 K/uL (ref 150–400)
RBC: 4.69 MIL/uL (ref 4.22–5.81)
RDW: 14.4 % (ref 11.5–15.5)
WBC: 11.7 K/uL — ABNORMAL HIGH (ref 4.0–10.5)
nRBC: 0 % (ref 0.0–0.2)

## 2024-05-25 LAB — COMPREHENSIVE METABOLIC PANEL WITH GFR
ALT: 11 U/L (ref 0–44)
AST: 20 U/L (ref 15–41)
Albumin: 4 g/dL (ref 3.5–5.0)
Alkaline Phosphatase: 84 U/L (ref 38–126)
Anion gap: 9 (ref 5–15)
BUN: 16 mg/dL (ref 8–23)
CO2: 26 mmol/L (ref 22–32)
Calcium: 9.3 mg/dL (ref 8.9–10.3)
Chloride: 104 mmol/L (ref 98–111)
Creatinine, Ser: 0.97 mg/dL (ref 0.61–1.24)
GFR, Estimated: >60 mL/min (ref >60)
Glucose, Bld: 94 mg/dL (ref 70–99)
Potassium: 3 mmol/L — ABNORMAL LOW (ref 3.5–5.1)
Sodium: 139 mmol/L (ref 135–145)
Total Bilirubin: 0.8 mg/dL (ref 0.0–1.2)
Total Protein: 8.2 g/dL — ABNORMAL HIGH (ref 6.5–8.1)

## 2024-05-25 LAB — BRAIN NATRIURETIC PEPTIDE: B Natriuretic Peptide: 297 pg/mL — ABNORMAL HIGH (ref 0.0–100.0)

## 2024-05-25 MED ORDER — FUROSEMIDE 20 MG PO TABS: 20.0000 mg | ORAL_TABLET | Freq: Every day | ORAL | 0 refills | Status: DC

## 2024-05-25 MED ORDER — HYDROCODONE-ACETAMINOPHEN 5-325 MG PO TABS
1.0000 | ORAL_TABLET | Freq: Once | ORAL | Status: AC
  Administered 2024-05-25 (×2): 1 via ORAL
  Filled 2024-05-25: qty 1

## 2024-05-25 MED ORDER — POTASSIUM CHLORIDE CRYS ER 20 MEQ PO TBCR
40.0000 meq | EXTENDED_RELEASE_TABLET | Freq: Once | ORAL | Status: AC
  Administered 2024-05-25 (×2): 40 meq via ORAL
  Filled 2024-05-25: qty 2

## 2024-05-25 MED ORDER — HYDROCODONE-ACETAMINOPHEN 5-325 MG PO TABS: 1.0000 | ORAL_TABLET | Freq: Four times a day (QID) | ORAL | 0 refills | Status: DC | PRN

## 2024-05-25 MED ORDER — CEPHALEXIN 500 MG PO CAPS
500.0000 mg | ORAL_CAPSULE | Freq: Once | ORAL | Status: AC
  Administered 2024-05-25 (×2): 500 mg via ORAL
  Filled 2024-05-25: qty 1

## 2024-05-25 MED ORDER — CEPHALEXIN 500 MG PO CAPS: 500.0000 mg | ORAL_CAPSULE | Freq: Four times a day (QID) | ORAL | 0 refills | Status: DC

## 2024-05-25 NOTE — ED Notes (Signed)
 Pt ambulated to restroom w/o assistance upon return from radiology.

## 2024-05-25 NOTE — ED Provider Notes (Signed)
 [facility] AT [facility] Provider Note   CSN: [id number] Arrival date & time: 05/25/24  1436     Patient presents with: Foot Pain   Scott Ritter is a 63 y.o. male.  He has history of hypertension, hypokalemia, upper GI bleeding, CKD, CVA with chronic left-sided weakness.  He has chronic left foot pain but states over the past approximately 2 weeks had a gradual worsening of his pain and swelling which is new from his chronic pain.    Denies any injury or trauma, no fever or chills.  He states extremely tender with any touch.  He has been following with podiatry and he reports they plan on doing surgery but it is not scheduled yet.    Foot Pain       Prior to Admission medications   Medication Sig Start Date End Date Taking? Authorizing Provider  amLODipine  (NORVASC ) 10 MG tablet TAKE 1 TABLET EVERY DAY 08/19/22   Zarwolo, Gloria, FNP  aspirin  EC 81 MG EC tablet Take 1 tablet (81 mg total) by mouth daily. Swallow whole. 10/25/21   Maree, Pratik D, DO  atorvastatin  (LIPITOR) 80 MG tablet TAKE 1 TABLET (80 MG TOTAL) BY MOUTH DAILY. 03/10/24   Zarwolo, Gloria, FNP  Cholecalciferol  (VITAMIN D ) 2000 units CAPS Take 2,000 Units by mouth daily.    [provider]  Evolocumab  (REPATHA  SURECLICK) 140 MG/ML SOAJ Inject 140 mg into the skin every 14 (fourteen) days. 06/01/23   Zarwolo, Gloria, FNP  ezetimibe  (ZETIA ) 10 MG tablet Take 1 tablet (10 mg total) by mouth daily. 05/20/24   Zarwolo, Gloria, FNP  gabapentin  (NEURONTIN ) 300 MG capsule Take 1 capsule (300 mg total) by mouth 2 (two) times daily. 03/18/24   Zarwolo, Gloria, FNP  hydrALAZINE  (APRESOLINE ) 100 MG tablet TAKE 1 TABLET THREE TIMES DAILY 03/10/24   Zarwolo, Gloria, FNP  losartan -hydrochlorothiazide (HYZAAR) 100-25 MG tablet TAKE 1 TABLET EVERY DAY 01/28/24   Zarwolo, Gloria, FNP  metoprolol  tartrate (LOPRESSOR ) 25 MG tablet TAKE 1 TABLET TWICE DAILY 08/19/22   Zarwolo, Gloria, FNP   oxyCODONE -acetaminophen  (PERCOCET/ROXICET) 5-325 MG tablet Take 1 tablet by mouth every 8 (eight) hours as needed for severe pain (pain score 7-10). 04/08/24   Del Wilhelmena Lloyd Sola, FNP  potassium chloride  SA (KLOR-CON  M) 20 MEQ tablet TAKE 1 TABLET TWICE DAILY 09/03/23   Zarwolo, Gloria, FNP    Allergies: Patient has no known allergies.    Review of Systems  Updated Vital Signs BP (!) 160/77 (BP Location: Right Arm)   Pulse 61   Temp 98.4 F (36.9 C)   Resp 16   Ht 5' 7 (1.702 m)   Wt 84.8 kg   SpO2 95%   BMI 29.29 kg/m   Physical Exam Vitals and nursing note reviewed.  Constitutional:      General: He is not in acute distress.    Appearance: He is well-developed.  HENT:     Head: Normocephalic and atraumatic.     Mouth/Throat:     Mouth: Mucous membranes are moist.  Eyes:     Conjunctiva/sclera: Conjunctivae normal.  Cardiovascular:     Rate and Rhythm: Normal rate and regular rhythm.     Heart sounds: No murmur heard. Pulmonary:     Effort: Pulmonary effort is normal. No respiratory distress.     Breath sounds: Normal breath sounds. No rales.  Abdominal:     Palpations: Abdomen is soft.     Tenderness: There is no abdominal  tenderness.  Musculoskeletal:        General: No swelling.     Cervical back: Neck supple.     Right lower leg: Edema present.     Left lower leg: Edema present.     Comments: Noticed to the left ankle and dorsum of left foot with mild hyperemia.  DP and PT pulses intact bilaterally.  Patient has pain with range of motion of left ankle but range of motion is intact.  Skin:    General: Skin is warm and dry.     Capillary Refill: Capillary refill takes less than 2 seconds.  Neurological:     General: No focal deficit present.     Mental Status: He is alert and oriented to person, place, and time.  Psychiatric:        Mood and Affect: Mood normal.     (all labs ordered are listed, but only abnormal results are displayed) Labs  Reviewed - No data to display  EKG: None  Radiology: No results found.   Procedures   Medications Ordered in the ED - No data to display                                  Medical Decision Making Differential diagnose includes but not limited to cellulitis, gout, septic arthritis, osteoarthritis, DVT, CHF, other  ED course: Patient here for worsening left foot and ankle pain for the past approximately a week or so without injury or trauma, he does have some mild hyperemia on exam concerning for possible cellulitis.  Also consider DVT as he has new pitting edema though this is bilateral it is worse on the left compared to the right.  DVT rule out with ultrasound.  X-ray left foot and ankle ordered by me I viewed interpreted these independently and there is no fracture or other bony abnormality, there is soft tissue swelling.  No joint effusion.  I agree with radiology read.  I also got labs, patient has a mild leukocytosis 11.7 given the hyperemia on his ankle we will treat for likely cellulitis with Keflex .  There is no purulence.  Discussed with patient who is agreeable.  He does have elevated BNP, his echo several years ago showed normal EF.  He is already on HCTZ so we will start him on short course of Lasix  daily and have him follow-up with PCP.  He has an appointment scheduled on Monday.  Discussed that his amlodipine  could be contributing to the edema as well.  Patient was informed that potassium was slightly low today at 3.0.  To be repleted here, he states he has potassium tablets at home takes 20 mg a day and is going to double this.  Patient has a scheduled appointment for his PCP on Monday and is going to make sure he follows up with this.  At this time I do not feel patient needs admission and he is agreeable with the plan as above and following up with his PCP as scheduled.  He was given strict return precautions.     Amount and/or Complexity of Data Reviewed External Data  Reviewed: labs and notes. Labs: ordered. Decision-making details documented in ED Course. Radiology: ordered and independent interpretation performed. Decision-making details documented in ED Course.  Risk Prescription drug management.        Final diagnoses:  None    ED Discharge Orders     None  Suellen Sherran DELENA DEVONNA 05/25/24 1948    Cleotilde Rogue, MD 05/26/24 1316

## 2024-05-25 NOTE — ED Triage Notes (Signed)
 Pt reports left foot pain and swelling x 2 weeks, pt denies any injury and has not seen anyone for it before today.

## 2024-05-25 NOTE — Discharge Instructions (Addendum)
 Seen today for ankle pain and swelling.  It looks like you have a skin infection.  We are treating with an antibiotic cephalexin .  Make sure you take this full course.  You can also take the hydrocodone  as needed when pain is more severe.  Fortunately your test was negative for a blood clot.  Your x-rays did not show any abnormalities.  Regarding the swelling it is in both of your ankles.  There are lots of causes of this it could be potentially related to mildly decreased heart function, could be due to your blood pressure medication amlodipine .  Our plan is to give you several days of a fluid pill to help you pee more and try to get rid of some of the extra fluid which should help with the swelling.  Will make you pee frequently it is best to take this in the morning. keep your legs elevated much as you can.  Keep your appointment with your PCP on Monday for close recheck.  Your potassium was low today.  The medicine I am giving you can make it drop even more.  Double your potassium intake for the next 5 days.  Come back to the ER if you have new or worsening symptoms.  You were prescribed opiate pain medication. This can have many sided effect such as drowsiness, slowed breathing, and conspitation. Do not take it with other medications that cause drowsiness, or drink alcohol while taking it. Take a stool softener over the counter while taking it.

## 2024-05-30 ENCOUNTER — Encounter: Payer: Self-pay | Admitting: Family Medicine

## 2024-05-30 ENCOUNTER — Ambulatory Visit (INDEPENDENT_AMBULATORY_CARE_PROVIDER_SITE_OTHER): Admitting: Family Medicine

## 2024-05-30 VITALS — BP 140/70 | HR 56 | Ht 67.0 in | Wt 194.0 lb

## 2024-05-30 DIAGNOSIS — M79672 Pain in left foot: Secondary | ICD-10-CM

## 2024-05-30 DIAGNOSIS — G8929 Other chronic pain: Secondary | ICD-10-CM

## 2024-05-30 DIAGNOSIS — I10 Essential (primary) hypertension: Secondary | ICD-10-CM | POA: Diagnosis not present

## 2024-05-30 MED ORDER — OXYCODONE-ACETAMINOPHEN 5-325 MG PO TABS: 1.0000 | ORAL_TABLET | Freq: Four times a day (QID) | ORAL | 0 refills | Status: AC | PRN

## 2024-05-30 NOTE — Assessment & Plan Note (Signed)
 Refilled Percocet 5-325 mg, to take one tablet every 6 hours as needed for pain relief. Encouraged the patient to continue follow-up with podiatry for ongoing management and surgical planning. Reviewed appropriate use of narcotics, including risks of dependency, and advised to use the lowest effective dose.

## 2024-05-30 NOTE — Assessment & Plan Note (Addendum)
 Encouraged to monitor blood pressure closely; current elevation is likely secondary to pain. Continue antihypertensive regimen as prescribed.

## 2024-05-30 NOTE — Progress Notes (Signed)
 Established Patient Office Visit  Subjective:  Patient ID: Scott Ritter, male    DOB: 1961-08-09  Age: 63 y.o. MRN: [id number]  CC:  Chief Complaint  Patient presents with   Hypertension    Four month follow up     HPI Scott Ritter is a 63 y.o. male with past medical history of hypertension, neuropathic pain of foot, left presents for f/u of  chronic medical conditions.  The patient presents today with left foot pain rated 9/10 and is requesting a refill of his narcotic medication. He reports that he is scheduled to undergo surgery on the left foot, but the date has not yet been finalized.  His blood pressure is elevated in the clinic today, which is likely secondary to his pain. He denies chest pain, shortness of breath, dizziness, visual changes, or other acute symptoms. He reports compliance with his current antihypertensive regimen.  Past Medical History:  Diagnosis Date   GERD (gastroesophageal reflux disease)    Hypertension    Stroke [facility])    left side deficits    Past Surgical History:  Procedure Laterality Date   BACK SURGERY     BIOPSY  09/17/2018   Procedure: BIOPSY;  Surgeon: Harvey Margo CROME, MD;  [facility];  Service: Endoscopy;;  duodenal biopsy    COLONOSCOPY N/A 02/29/2016   Dr. Harvey: redundant colon, non-bleeding internal hemorrhoids    COLONOSCOPY WITH PROPOFOL  N/A 04/09/2021   Procedure: COLONOSCOPY WITH PROPOFOL ;  Surgeon: Cindie Carlin POUR, DO;  Location: [facility];  Service: Endoscopy;  Laterality: N/A;  7:30am   ESOPHAGOGASTRODUODENOSCOPY N/A 12/17/2016   Dr. harvey: 3 mm nonbleeding Mallory-Weiss tear.  EGD performed for hematemesis.  Hemoglobin normal.   ESOPHAGOGASTRODUODENOSCOPY N/A 02/19/2018   Dr. Shaaron: Performed for melena, hemoglobin of 6.  Mucosal changes in the esophagus query short segment Barrett's, biopsy more consistent with reflux changes.  Erosive gastropathy but no H. pylori.  Duodenal erosions.   Suspected NSAID induced injury.   ESOPHAGOGASTRODUODENOSCOPY  08/18/2018   Dr. Golda: IDA/heme positive stool.  Esophageal mucosal changes consistent with short segment Barrett's esophagus, not biopsied.  2 cm hiatal hernia.  Duodenal bulb, second portion of duodenum, third portion of the duodenum.  Video capsule somewhat difficult to pass through the oropharynx but was eventually advanced into the second portion of the duodenum and released.   ESOPHAGOGASTRODUODENOSCOPY N/A 08/18/2018   Procedure: ESOPHAGOGASTRODUODENOSCOPY (EGD);  Surgeon: Golda Claudis PENNER, MD;  [facility];  Service: Endoscopy;  Laterality: N/A;   ESOPHAGOGASTRODUODENOSCOPY N/A 09/17/2018   gastritis, 2 nonbleeding angiectasia's in the duodenum were treated with [facility] coagulation status post biopsy.   FOOT SURGERY Left    GIVENS CAPSULE STUDY N/A 08/18/2018   Procedure: GIVENS CAPSULE STUDY;  Surgeon: Golda Claudis PENNER, MD;  Location[facility];  Service: Endoscopy;  Laterality: N/A;   HERNIA REPAIR     POLYPECTOMY  04/09/2021   Procedure: POLYPECTOMY;  Surgeon: Cindie Carlin POUR, DO;  Location[facility];  Service: Endoscopy;;  descending; cecal    Family History  Problem Relation Age of Onset   Hypertension Mother    Hypertension Father    Colon cancer Father    Colon polyps Neg Hx     Social History   Socioeconomic History   Marital status: Divorced    Spouse name: Not on file   Number of children: 2   Years of education: Not on file   Highest education level: Not  on file  Occupational History   Not on file  Tobacco Use   Smoking status: Never   Smokeless tobacco: Never  Vaping Use   Vaping status: Never Used  Substance and Sexual Activity   Alcohol use: Yes    Alcohol/week: 1.0 standard drink of alcohol    Types: 1 Cans of beer per week    Comment: occasionally; once a week or once a month   Drug use: No   Sexual activity: Yes    Comment: not married hs sexual partner  Other  Topics Concern   Not on file  Social History Narrative   PT lives with his mother, he is on disability for his stroke.    Social Drivers of Corporate investment banker Strain: Low Risk  (02/17/2024)   Overall Financial Resource Strain (CARDIA)    Difficulty of Paying Living Expenses: Not hard at all  Food Insecurity: No Food Insecurity (02/17/2024)   Hunger Vital Sign    Worried About Running Out of Food in the Last Year: Never true    Ran Out of Food in the Last Year: Never true  Transportation Needs: No Transportation Needs (02/17/2024)   PRAPARE - Administrator, Civil Service (Medical): No    Lack of Transportation (Non-Medical): No  Physical Activity: Sufficiently Active (02/17/2024)   Exercise Vital Sign    Days of Exercise per Week: 7 days    Minutes of Exercise per Session: 30 min  Recent Concern: Physical Activity - Insufficiently Active (12/23/2023)   Exercise Vital Sign    Days of Exercise per Week: 2 days    Minutes of Exercise per Session: 50 min  Stress: No Stress Concern Present (02/17/2024)   [employer] of Occupational Health - Occupational Stress Questionnaire    Feeling of Stress : Not at all  Social Connections: Moderately Isolated (02/17/2024)   Social Connection and Isolation Panel    Frequency of Communication with Friends and Family: More than three times a week    Frequency of Social Gatherings with Friends and Family: Twice a week    Attends Religious Services: More than 4 times per year    Active Member of [employer] or Organizations: No    Attends Banker Meetings: Never    Marital Status: Divorced  Catering manager Violence: Not At Risk (02/17/2024)   Humiliation, Afraid, Rape, and Kick questionnaire    Fear of Current or Ex-Partner: No    Emotionally Abused: No    Physically Abused: No    Sexually Abused: No    Outpatient Medications Prior to Visit  Medication Sig Dispense Refill   amLODipine  (NORVASC ) 10 MG tablet TAKE 1 TABLET  EVERY DAY 90 tablet 10   aspirin  EC 81 MG EC tablet Take 1 tablet (81 mg total) by mouth daily. Swallow whole. 30 tablet 11   atorvastatin  (LIPITOR) 80 MG tablet TAKE 1 TABLET (80 MG TOTAL) BY MOUTH DAILY. 90 tablet 3   cephALEXin  (KEFLEX ) 500 MG capsule Take 1 capsule (500 mg total) by mouth 4 (four) times daily. 20 capsule 0   Cholecalciferol  (VITAMIN D ) 2000 units CAPS Take 2,000 Units by mouth daily.     Evolocumab  (REPATHA  SURECLICK) 140 MG/ML SOAJ Inject 140 mg into the skin every 14 (fourteen) days. 2 mL 2   ezetimibe  (ZETIA ) 10 MG tablet Take 1 tablet (10 mg total) by mouth daily. 90 tablet 3   furosemide  (LASIX ) 20 MG tablet Take 1 tablet (20 mg total)  by mouth daily. 5 tablet 0   gabapentin  (NEURONTIN ) 300 MG capsule Take 1 capsule (300 mg total) by mouth 2 (two) times daily. 90 capsule 1   hydrALAZINE  (APRESOLINE ) 100 MG tablet TAKE 1 TABLET THREE TIMES DAILY 270 tablet 3   HYDROcodone -acetaminophen  (NORCO/VICODIN) 5-325 MG tablet Take 1 tablet by mouth every 6 (six) hours as needed. 6 tablet 0   losartan -hydrochlorothiazide (HYZAAR) 100-25 MG tablet TAKE 1 TABLET EVERY DAY 90 tablet 3   metoprolol  tartrate (LOPRESSOR ) 25 MG tablet TAKE 1 TABLET TWICE DAILY 180 tablet 10   oxyCODONE -acetaminophen  (PERCOCET/ROXICET) 5-325 MG tablet Take 1 tablet by mouth every 8 (eight) hours as needed for severe pain (pain score 7-10). 15 tablet 0   potassium chloride  SA (KLOR-CON  M) 20 MEQ tablet TAKE 1 TABLET TWICE DAILY 180 tablet 3   No facility-administered medications prior to visit.    No Known Allergies  ROS Review of Systems  Constitutional:  Negative for fatigue and fever.  Eyes:  Negative for visual disturbance.  Respiratory:  Negative for chest tightness and shortness of breath.   Cardiovascular:  Negative for chest pain and palpitations.  Musculoskeletal:        Left foot pain  Neurological:  Negative for dizziness and headaches.      Objective:    Physical Exam HENT:      Head: Normocephalic.     Right Ear: External ear normal.     Left Ear: External ear normal.     Nose: No congestion or rhinorrhea.     Mouth/Throat:     Mouth: Mucous membranes are moist.  Cardiovascular:     Rate and Rhythm: Regular rhythm.     Heart sounds: No murmur heard. Pulmonary:     Effort: No respiratory distress.     Breath sounds: Normal breath sounds.  Neurological:     Mental Status: He is alert.     BP (!) 140/70   Pulse (!) 56   Ht 5' 7 (1.702 m)   Wt 194 lb (88 kg)   SpO2 95%   BMI 30.38 kg/m  Wt Readings from Last 3 Encounters:  05/30/24 194 lb (88 kg)  05/25/24 187 lb (84.8 kg)  02/24/24 192 lb (87.1 kg)    Lab Results  Component Value Date   TSH 0.992 01/25/2024   Lab Results  Component Value Date   WBC 11.7 (H) 05/25/2024   HGB 13.6 05/25/2024   HCT 42.0 05/25/2024   MCV 89.6 05/25/2024   PLT 278 05/25/2024   Lab Results  Component Value Date   NA 139 05/25/2024   K 3.0 (L) 05/25/2024   CO2 26 05/25/2024   GLUCOSE 94 05/25/2024   BUN 16 05/25/2024   CREATININE 0.97 05/25/2024   BILITOT 0.8 05/25/2024   ALKPHOS 84 05/25/2024   AST 20 05/25/2024   ALT 11 05/25/2024   PROT 8.2 (H) 05/25/2024   ALBUMIN 4.0 05/25/2024   CALCIUM  9.3 05/25/2024   ANIONGAP 9 05/25/2024   EGFR 73 01/25/2024   Lab Results  Component Value Date   CHOL 139 01/25/2024   Lab Results  Component Value Date   HDL 37 (L) 01/25/2024   Lab Results  Component Value Date   LDLCALC 77 01/25/2024   Lab Results  Component Value Date   TRIG 140 01/25/2024   Lab Results  Component Value Date   CHOLHDL 3.8 01/25/2024   Lab Results  Component Value Date   HGBA1C 5.7 (H) 01/25/2024  Assessment & Plan:  Chronic foot pain, left Assessment & Plan: Refilled Percocet 5-325 mg, to take one tablet every 6 hours as needed for pain relief. Encouraged the patient to continue follow-up with podiatry for ongoing management and surgical planning. Reviewed  appropriate use of narcotics, including risks of dependency, and advised to use the lowest effective dose.   Orders: -     oxyCODONE -Acetaminophen ; Take 1 tablet by mouth every 6 (six) hours as needed for up to 5 days for severe pain (pain score 7-10).  Dispense: 15 tablet; Refill: 0  Primary hypertension Assessment & Plan: Encouraged to monitor blood pressure closely; current elevation is likely secondary to pain. Continue antihypertensive regimen as prescribed.   Note: This chart has been completed using Engineer, civil (consulting) software, and while attempts have been made to ensure accuracy, certain words and phrases may not be transcribed as intended.    Follow-up: Return in about 4 months (around 09/29/2024).   Jasper Ruminski, FNP

## 2024-05-30 NOTE — Patient Instructions (Addendum)
 I appreciate the opportunity to provide care to you today!    Follow up:  4 months  For a Healthier YOU, I Recommend: Reducing your intake of sugar, sodium, carbohydrates, and saturated fats. Increasing your fiber intake by incorporating more whole grains, fruits, and vegetables into your meals. Setting healthy goals with a focus on lowering your consumption of carbs, sugar, and unhealthy fats. Adding variety to your diet by including a wide range of fruits and vegetables. Cutting back on soda and limiting processed foods as much as possible. Staying active: In addition to taking your weight loss medication, aim for at least 150 minutes of moderate-intensity physical activity each week for optimal results.    Please follow up if your symptoms worsen or fail to improve.    Attached with your AVS, you will find valuable resources for self-education. I highly recommend dedicating some time to thoroughly examine them.   Please continue to a heart-healthy diet and increase your physical activities. Try to exercise for at least five days a week.    It was a pleasure to see you and I look forward to continuing to work together on your health and well-being. Please do not hesitate to call the office if you need care or have questions about your care.  In case of emergency, please visit the Emergency Department for urgent care, or contact our clinic at [phone] to schedule an appointment. We're here to help you!   Have a wonderful day and week. With Gratitude, Zayvon Alicea MSN, FNP-BC

## 2024-06-07 ENCOUNTER — Telehealth: Payer: Self-pay | Admitting: Podiatry

## 2024-06-07 NOTE — Telephone Encounter (Signed)
 DOS- 06/14/2024  2ND METATARSAL OSTEOTOMY LT- 71691  [employer] EFFECTIVE DATE- 01/12/2024  DEDUCTIBLE- N/A OOP- $9350 REMAINING- $0806.28 COINSURANCE- 0%/$400 COPAY  PER [employer]BSITE, PRIOR AUTH FOR CPT CODE 71691 HAS BEEN APPROVED FROM 06/14/2024-09/13/2024. AUTH# [id number]

## 2024-06-14 ENCOUNTER — Encounter: Payer: Self-pay | Admitting: Podiatry

## 2024-06-14 DIAGNOSIS — M21542 Acquired clubfoot, left foot: Secondary | ICD-10-CM | POA: Diagnosis not present

## 2024-06-14 DIAGNOSIS — M67472 Ganglion, left ankle and foot: Secondary | ICD-10-CM | POA: Diagnosis not present

## 2024-06-14 DIAGNOSIS — M205X2 Other deformities of toe(s) (acquired), left foot: Secondary | ICD-10-CM | POA: Diagnosis not present

## 2024-06-14 DIAGNOSIS — M7752 Other enthesopathy of left foot: Secondary | ICD-10-CM | POA: Diagnosis not present

## 2024-06-14 MED ORDER — HYDROCODONE-ACETAMINOPHEN 10-325 MG PO TABS: 1.0000 | ORAL_TABLET | Freq: Three times a day (TID) | ORAL | 0 refills | Status: AC | PRN

## 2024-06-14 NOTE — Addendum Note (Signed)
 Addended by: MAGDALEN PASCO RAMAN on: 06/14/2024 08:37 PM   Modules accepted: Orders

## 2024-06-15 ENCOUNTER — Telehealth: Payer: Self-pay | Admitting: Lab

## 2024-06-15 NOTE — Telephone Encounter (Signed)
 I sent it in yesterday evening

## 2024-06-15 NOTE — Telephone Encounter (Signed)
 No order at [facility] patient wants to know if it was sent to correct pharmacy?

## 2024-06-15 NOTE — Telephone Encounter (Signed)
 Patient had surgery and is in need of pain medication non was called into [facility].

## 2024-06-20 ENCOUNTER — Ambulatory Visit (INDEPENDENT_AMBULATORY_CARE_PROVIDER_SITE_OTHER): Admitting: Podiatry

## 2024-06-20 ENCOUNTER — Encounter: Payer: Self-pay | Admitting: Podiatry

## 2024-06-20 ENCOUNTER — Ambulatory Visit (INDEPENDENT_AMBULATORY_CARE_PROVIDER_SITE_OTHER)

## 2024-06-20 VITALS — Ht 67.0 in | Wt 194.0 lb

## 2024-06-20 DIAGNOSIS — M7752 Other enthesopathy of left foot: Secondary | ICD-10-CM

## 2024-06-21 NOTE — Progress Notes (Signed)
 Subjective:   Patient ID: Scott Ritter, male   DOB: 63 y.o.   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

## 2024-07-06 ENCOUNTER — Encounter: Admitting: Podiatry

## 2024-07-20 ENCOUNTER — Ambulatory Visit (INDEPENDENT_AMBULATORY_CARE_PROVIDER_SITE_OTHER)

## 2024-07-20 ENCOUNTER — Encounter: Payer: Self-pay | Admitting: Podiatry

## 2024-07-20 ENCOUNTER — Ambulatory Visit (INDEPENDENT_AMBULATORY_CARE_PROVIDER_SITE_OTHER): Admitting: Podiatry

## 2024-07-20 DIAGNOSIS — M7752 Other enthesopathy of left foot: Secondary | ICD-10-CM

## 2024-07-20 NOTE — Progress Notes (Signed)
 Subjective:   Patient ID: Scott Ritter, male   DOB: 63 y.o.   MRN: [id number]   HPI Patient states doing very well with surgery   ROS      Objective:  Physical Exam  Neurovascular status intact with patient's left second metatarsal healing well mild to moderate edema but states no pain currently     Assessment:  Doing well post osteotomy second metatarsal left     Plan:  H&P x-ray done reviewed he does have slight motion with secondary bone healing but was active on his foot and did walk on its own without shoe.  At this point sterile compression dispensed to patient continue elevation rigid bottom shoes and reappoint as symptoms indicate should heal uneventfully  X-rays do indicate secondary bone healing around the second metatarsal distal shaft

## 2024-07-27 ENCOUNTER — Ambulatory Visit (INDEPENDENT_AMBULATORY_CARE_PROVIDER_SITE_OTHER): Payer: Self-pay

## 2024-07-27 DIAGNOSIS — Z23 Encounter for immunization: Secondary | ICD-10-CM

## 2024-07-27 NOTE — Progress Notes (Signed)
 Patient is in office today for a nurse visit for Immunization. Patient Injection was given in the  Left deltoid. Patient tolerated injection well.

## 2024-09-26 ENCOUNTER — Other Ambulatory Visit: Payer: Self-pay | Admitting: Family Medicine

## 2024-09-26 DIAGNOSIS — E876 Hypokalemia: Secondary | ICD-10-CM

## 2024-09-29 ENCOUNTER — Ambulatory Visit: Admitting: Family Medicine

## 2024-11-01 ENCOUNTER — Other Ambulatory Visit: Payer: Self-pay

## 2024-11-01 ENCOUNTER — Emergency Department (HOSPITAL_COMMUNITY)

## 2024-11-01 ENCOUNTER — Inpatient Hospital Stay (HOSPITAL_COMMUNITY)
Admission: EM | Admit: 2024-11-01 | Discharge: 2024-11-05 | DRG: 291 | Disposition: A | Discharge status: Home / Self Care | Attending: Family Medicine | Admitting: Family Medicine

## 2024-11-01 ENCOUNTER — Encounter (HOSPITAL_COMMUNITY): Payer: Self-pay

## 2024-11-01 DIAGNOSIS — Z8673 Personal history of transient ischemic attack (TIA), and cerebral infarction without residual deficits: Secondary | ICD-10-CM

## 2024-11-01 DIAGNOSIS — I5023 Acute on chronic systolic (congestive) heart failure: Secondary | ICD-10-CM | POA: Diagnosis present

## 2024-11-01 DIAGNOSIS — I2489 Other forms of acute ischemic heart disease: Secondary | ICD-10-CM | POA: Diagnosis present

## 2024-11-01 DIAGNOSIS — I472 Ventricular tachycardia, unspecified: Secondary | ICD-10-CM | POA: Diagnosis present

## 2024-11-01 DIAGNOSIS — I4891 Unspecified atrial fibrillation: Secondary | ICD-10-CM | POA: Diagnosis not present

## 2024-11-01 DIAGNOSIS — Z79899 Other long term (current) drug therapy: Secondary | ICD-10-CM

## 2024-11-01 DIAGNOSIS — I493 Ventricular premature depolarization: Secondary | ICD-10-CM | POA: Diagnosis present

## 2024-11-01 DIAGNOSIS — I480 Paroxysmal atrial fibrillation: Secondary | ICD-10-CM | POA: Diagnosis present

## 2024-11-01 DIAGNOSIS — E860 Dehydration: Secondary | ICD-10-CM | POA: Diagnosis present

## 2024-11-01 DIAGNOSIS — N179 Acute kidney failure, unspecified: Secondary | ICD-10-CM | POA: Diagnosis present

## 2024-11-01 DIAGNOSIS — I5021 Acute systolic (congestive) heart failure: Secondary | ICD-10-CM | POA: Diagnosis present

## 2024-11-01 DIAGNOSIS — Z6830 Body mass index (BMI) 30.0-30.9, adult: Secondary | ICD-10-CM

## 2024-11-01 DIAGNOSIS — R350 Frequency of micturition: Secondary | ICD-10-CM | POA: Diagnosis present

## 2024-11-01 DIAGNOSIS — E669 Obesity, unspecified: Secondary | ICD-10-CM | POA: Diagnosis present

## 2024-11-01 DIAGNOSIS — K219 Gastro-esophageal reflux disease without esophagitis: Secondary | ICD-10-CM | POA: Diagnosis present

## 2024-11-01 DIAGNOSIS — E782 Mixed hyperlipidemia: Secondary | ICD-10-CM

## 2024-11-01 DIAGNOSIS — I272 Pulmonary hypertension, unspecified: Secondary | ICD-10-CM | POA: Diagnosis present

## 2024-11-01 DIAGNOSIS — I1310 Hypertensive heart and chronic kidney disease without heart failure, with stage 1 through stage 4 chronic kidney disease, or unspecified chronic kidney disease: Secondary | ICD-10-CM

## 2024-11-01 DIAGNOSIS — Z8249 Family history of ischemic heart disease and other diseases of the circulatory system: Secondary | ICD-10-CM

## 2024-11-01 DIAGNOSIS — I110 Hypertensive heart disease with heart failure: Principal | ICD-10-CM | POA: Diagnosis present

## 2024-11-01 DIAGNOSIS — Z8711 Personal history of peptic ulcer disease: Secondary | ICD-10-CM

## 2024-11-01 DIAGNOSIS — Z8719 Personal history of other diseases of the digestive system: Secondary | ICD-10-CM

## 2024-11-01 DIAGNOSIS — E876 Hypokalemia: Secondary | ICD-10-CM | POA: Diagnosis present

## 2024-11-01 DIAGNOSIS — E785 Hyperlipidemia, unspecified: Secondary | ICD-10-CM | POA: Diagnosis present

## 2024-11-01 DIAGNOSIS — Z7982 Long term (current) use of aspirin: Secondary | ICD-10-CM

## 2024-11-01 DIAGNOSIS — I639 Cerebral infarction, unspecified: Secondary | ICD-10-CM | POA: Diagnosis present

## 2024-11-01 DIAGNOSIS — D649 Anemia, unspecified: Secondary | ICD-10-CM | POA: Diagnosis present

## 2024-11-01 DIAGNOSIS — I43 Cardiomyopathy in diseases classified elsewhere: Secondary | ICD-10-CM | POA: Diagnosis present

## 2024-11-01 DIAGNOSIS — R54 Age-related physical debility: Secondary | ICD-10-CM | POA: Diagnosis present

## 2024-11-01 LAB — CBC WITH DIFFERENTIAL/PLATELET
Abs Immature Granulocytes: 0.03 K/uL (ref 0.00–0.07)
Basophils Absolute: 0.1 K/uL (ref 0.0–0.1)
Basophils Relative: 1 %
Eosinophils Absolute: 0.1 K/uL (ref 0.0–0.5)
Eosinophils Relative: 1 %
HCT: 40.6 % (ref 39.0–52.0)
Hemoglobin: 13.2 g/dL (ref 13.0–17.0)
Immature Granulocytes: 0 %
Lymphocytes Relative: 14 %
Lymphs Abs: 1.5 K/uL (ref 0.7–4.0)
MCH: 27.9 pg (ref 26.0–34.0)
MCHC: 32.5 g/dL (ref 30.0–36.0)
MCV: 85.8 fL (ref 80.0–100.0)
Monocytes Absolute: 0.9 K/uL (ref 0.1–1.0)
Monocytes Relative: 8 %
Neutro Abs: 8.2 K/uL — ABNORMAL HIGH (ref 1.7–7.7)
Neutrophils Relative %: 76 %
Platelets: 341 K/uL (ref 150–400)
RBC: 4.73 MIL/uL (ref 4.22–5.81)
RDW: 15.4 % (ref 11.5–15.5)
WBC: 10.8 K/uL — ABNORMAL HIGH (ref 4.0–10.5)
nRBC: 0 % (ref 0.0–0.2)

## 2024-11-01 LAB — PROTIME-INR
INR: 1 (ref 0.8–1.2)
Prothrombin Time: 13.9 s (ref 11.4–15.2)

## 2024-11-01 LAB — COMPREHENSIVE METABOLIC PANEL WITH GFR
ALT: 25 U/L (ref 0–44)
AST: 32 U/L (ref 15–41)
Albumin: 3.5 g/dL (ref 3.5–5.0)
Alkaline Phosphatase: 105 U/L (ref 38–126)
Anion gap: 15 (ref 5–15)
BUN: 32 mg/dL — ABNORMAL HIGH (ref 8–23)
CO2: 22 mmol/L (ref 22–32)
Calcium: 8.8 mg/dL — ABNORMAL LOW (ref 8.9–10.3)
Chloride: 102 mmol/L (ref 98–111)
Creatinine, Ser: 1.97 mg/dL — ABNORMAL HIGH (ref 0.61–1.24)
GFR, Estimated: 38 mL/min — ABNORMAL LOW
Glucose, Bld: 101 mg/dL — ABNORMAL HIGH (ref 70–99)
Potassium: 3.3 mmol/L — ABNORMAL LOW (ref 3.5–5.1)
Sodium: 140 mmol/L (ref 135–145)
Total Bilirubin: 1.1 mg/dL (ref 0.0–1.2)
Total Protein: 6.7 g/dL (ref 6.5–8.1)

## 2024-11-01 LAB — URINALYSIS, ROUTINE W REFLEX MICROSCOPIC
Bilirubin Urine: NEGATIVE
Glucose, UA: NEGATIVE mg/dL
Hgb urine dipstick: NEGATIVE
Ketones, ur: NEGATIVE mg/dL
Leukocytes,Ua: NEGATIVE
Nitrite: NEGATIVE
Protein, ur: NEGATIVE mg/dL
Specific Gravity, Urine: 1.027 (ref 1.005–1.030)
pH: 7 (ref 5.0–8.0)

## 2024-11-01 LAB — TROPONIN T, HIGH SENSITIVITY
Troponin T High Sensitivity: 132 ng/L (ref 0–19)
Troponin T High Sensitivity: 131 ng/L (ref 0–19)

## 2024-11-01 LAB — MAGNESIUM: Magnesium: 1.7 mg/dL (ref 1.7–2.4)

## 2024-11-01 LAB — APTT: aPTT: 27 s (ref 24–36)

## 2024-11-01 LAB — LIPASE, BLOOD: Lipase: 31 U/L (ref 11–51)

## 2024-11-01 MED ORDER — EZETIMIBE 10 MG PO TABS
10.0000 mg | ORAL_TABLET | Freq: Every day | ORAL | Status: DC
  Administered 2024-11-02 – 2024-11-05 (×4): 10 mg via ORAL
  Filled 2024-11-01 (×4): qty 1

## 2024-11-01 MED ORDER — POTASSIUM CHLORIDE CRYS ER 20 MEQ PO TBCR
40.0000 meq | EXTENDED_RELEASE_TABLET | Freq: Once | ORAL | Status: AC
  Administered 2024-11-01: 40 meq via ORAL
  Filled 2024-11-01: qty 2

## 2024-11-01 MED ORDER — POTASSIUM CHLORIDE CRYS ER 20 MEQ PO TBCR
20.0000 meq | EXTENDED_RELEASE_TABLET | Freq: Once | ORAL | Status: DC
  Filled 2024-11-01: qty 1

## 2024-11-01 MED ORDER — LACTATED RINGERS IV BOLUS
1000.0000 mL | Freq: Once | INTRAVENOUS | Status: AC
  Administered 2024-11-01: 1000 mL via INTRAVENOUS

## 2024-11-01 MED ORDER — ATORVASTATIN CALCIUM 40 MG PO TABS
80.0000 mg | ORAL_TABLET | Freq: Every day | ORAL | Status: DC
  Administered 2024-11-02 – 2024-11-05 (×4): 80 mg via ORAL
  Filled 2024-11-01 (×6): qty 2

## 2024-11-01 MED ORDER — IOHEXOL 350 MG/ML SOLN
75.0000 mL | Freq: Once | INTRAVENOUS | Status: AC | PRN
  Administered 2024-11-01: 75 mL via INTRAVENOUS

## 2024-11-01 MED ORDER — MELATONIN 3 MG PO TABS
6.0000 mg | ORAL_TABLET | Freq: Every evening | ORAL | Status: DC | PRN
  Administered 2024-11-02 – 2024-11-04 (×2): 6 mg via ORAL
  Filled 2024-11-01 (×2): qty 2

## 2024-11-01 MED ORDER — HEPARIN (PORCINE) 25000 UT/250ML-% IV SOLN
1250.0000 [IU]/h | INTRAVENOUS | Status: DC
  Administered 2024-11-01 – 2024-11-02 (×2): 1250 [IU]/h via INTRAVENOUS
  Filled 2024-11-01 (×2): qty 250

## 2024-11-01 MED ORDER — POLYETHYLENE GLYCOL 3350 17 G PO PACK: 17.0000 g | PACK | Freq: Every day | ORAL | Status: DC | PRN

## 2024-11-01 MED ORDER — HEPARIN BOLUS VIA INFUSION
5000.0000 [IU] | Freq: Once | INTRAVENOUS | Status: AC
  Administered 2024-11-01: 5000 [IU] via INTRAVENOUS

## 2024-11-01 MED ORDER — PROCHLORPERAZINE EDISYLATE 10 MG/2ML IJ SOLN: 5.0000 mg | Freq: Four times a day (QID) | INTRAMUSCULAR | Status: DC | PRN

## 2024-11-01 MED ORDER — ACETAMINOPHEN 500 MG PO TABS: 500.0000 mg | ORAL_TABLET | Freq: Four times a day (QID) | ORAL | Status: DC | PRN

## 2024-11-01 NOTE — ED Notes (Signed)
 Patient transported to CT

## 2024-11-01 NOTE — ED Triage Notes (Signed)
 Pt arrived via POV c/o right flank pain X 3 days and describes it as intermittent sharp pain. Pt also reports at this time he feels lethargic and SOB at times as well.

## 2024-11-01 NOTE — Consult Note (Signed)
 PHARMACY - ANTICOAGULATION CONSULT NOTE  Pharmacy Consult for Heparin  Indication: atrial fibrillation  Allergies[1]  Patient Measurements: Height: 5' 7 (170.2 cm) Weight: 88 kg (194 lb 0.1 oz) IBW/kg (Calculated) : 66.1 HEPARIN  DW (KG): 84.2  Vital Signs: Temp: 98.2 F (36.8 C) (01/20 1900) BP: 145/111 (01/20 1930) Pulse Rate: 77 (01/20 1930)  Labs: Recent Labs    11/01/24 1803  HGB 13.2  HCT 40.6  PLT 341  CREATININE 1.97*    Estimated Creatinine Clearance: 41.7 mL/min (A) (by C-G formula based on SCr of 1.97 mg/dL (H)).   Medical History: Past Medical History:  Diagnosis Date   GERD (gastroesophageal reflux disease)    Hypertension    Stroke (HCC)    left side deficits    Medications:  No history of chronic anticoagulant use PTA  Assessment: 64 year old male who presents emergency department chief complaint of right abdominal pain and flank pain for approximate the past 3 days. Cardiac monitor showing atrial fibrillation. Pharmacy has been consulted to initiate and dose continuous heparin  infusion. Baseline labs have been ordered and are pending.   Goal of Therapy:  Heparin  level 0.3-0.7 units/ml Monitor platelets by anticoagulation protocol: Yes   Plan:  Give 5000 units bolus x 1 Start heparin  infusion at 1250 units/hr Check anti-Xa level in 6 hours and daily while on heparin  Continue to monitor H&H and platelets  Alyson Ki A Tamra Koos 11/01/2024,8:43 PM      [1] No Known Allergies

## 2024-11-01 NOTE — H&P (Incomplete)
 " History and Physical  Scott Ritter FMW:[id number] DOB: 1961-04-11 DOA: 11/01/2024  Referring physician: Daralene Bruckner, PA-EDP  PCP: Edman Meade PEDLAR, FNP  Outpatient Specialists: Podiatry, cardiology. Patient coming from: Home.  Chief Complaint: Shortness of breath.  HPI: Scott Ritter is a 64 y.o. 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  Also complains of intermittent sharp right flank pain lasting about 20 seconds then resolving spontaneously.  Endorses frequent urination.  No chest pain at home, however had an episode of nonradiating left-sided sharp chest pain while in the ER that lasted 10 to 15 seconds then resolved spontaneously.  The patient had no report of subjective fevers and chills.  He presented to the ER for further evaluation.  In the ER, frail and weak appearing, hypertensive, and tachypneic 30.  Mild leukocytosis 10.8 K, elevated troponin 132, 131.  Creatinine 1.97 from normal baseline.  Twelve-lead EKG showed atrial fibrillation with a rate of 80.  QTc 483.   The patient was started on heparin  drip.  Also received LR bolus 1 L x 1 and potassium replacement p.o. KCl tab 40 mEq x 1.  TRH, hospitalist service, was asked to admit.  ED Course: Temperature 98.3.  BP 156/91, pulse 91, respiratory 18, O2 saturation 98% on room air.  Review of Systems: Review of systems as noted in the HPI. All other systems reviewed and are negative.   Past Medical History:  Diagnosis Date   GERD (gastroesophageal reflux disease)    Hypertension    Stroke [facility])    left side deficits   Past Surgical History:  Procedure Laterality Date   BACK SURGERY     BIOPSY  09/17/2018   Procedure: BIOPSY;  Surgeon: Harvey Margo CROME, MD;  Location: [facility];  Service: Endoscopy;;  duodenal biopsy     COLONOSCOPY N/A 02/29/2016   Dr. Harvey: redundant colon, non-bleeding internal hemorrhoids    COLONOSCOPY WITH PROPOFOL  N/A 04/09/2021   Procedure: COLONOSCOPY WITH PROPOFOL ;  Surgeon: Cindie Carlin POUR, DO;  Location: [facility];  Service: Endoscopy;  Laterality: N/A;  7:30am   ESOPHAGOGASTRODUODENOSCOPY N/A 12/17/2016   Dr. harvey: 3 mm nonbleeding Mallory-Weiss tear.  EGD performed for hematemesis.  Hemoglobin normal.   ESOPHAGOGASTRODUODENOSCOPY N/A 02/19/2018   Dr. Shaaron: Performed for melena, hemoglobin of 6.  Mucosal changes in the esophagus query short segment Barrett's, biopsy more consistent with reflux changes.  Erosive gastropathy but no H. pylori.  Duodenal erosions.  Suspected NSAID induced injury.   ESOPHAGOGASTRODUODENOSCOPY  08/18/2018   Dr. Golda: IDA/heme positive stool.  Esophageal mucosal changes consistent with short segment Barrett's esophagus, not biopsied.  2 cm hiatal hernia.  Duodenal bulb, second portion of duodenum, third portion of the duodenum.  Video capsule somewhat difficult to pass through the oropharynx but was eventually advanced into the second portion of the duodenum and released.   ESOPHAGOGASTRODUODENOSCOPY N/A 08/18/2018   Procedure: ESOPHAGOGASTRODUODENOSCOPY (EGD);  Surgeon: Golda Claudis PENNER, MD;  [facility];  Service: Endoscopy;  Laterality: N/A;   ESOPHAGOGASTRODUODENOSCOPY N/A 09/17/2018   gastritis, 2 nonbleeding angiectasia's in the duodenum were treated with [facility] coagulation status post biopsy.   FOOT SURGERY Left    GIVENS CAPSULE STUDY N/A 08/18/2018   Procedure: GIVENS CAPSULE STUDY;  Surgeon: Golda Claudis PENNER, MD;  Location: [facility];  Service: Endoscopy;  Laterality: N/A;   HERNIA REPAIR     POLYPECTOMY  04/09/2021   Procedure: POLYPECTOMY;  Surgeon: Cindie Carlin POUR, DO;  Location: [facility];  Service: Endoscopy;;  descending; cecal    Social History:  reports that he has never smoked. He has never been exposed  to tobacco smoke. He has never used smokeless tobacco. He reports current alcohol use of about 1.0 standard drink of alcohol per week. He reports that he does not use drugs.   Allergies[1]  Family History  Problem Relation Age of Onset   Hypertension Mother    Hypertension Father    Colon cancer Father    Colon polyps Neg Hx       Prior to Admission medications  Medication Sig Start Date End Date Taking? Authorizing Provider  amLODipine  (NORVASC ) 10 MG tablet TAKE 1 TABLET EVERY DAY 08/19/22   Bacchus, Meade PEDLAR, FNP  aspirin  EC 81 MG EC tablet Take 1 tablet (81 mg total) by mouth daily. Swallow whole. 10/25/21   Maree, Pratik D, DO  atorvastatin  (LIPITOR) 80 MG tablet TAKE 1 TABLET (80 MG TOTAL) BY MOUTH DAILY. 03/10/24   Bacchus, Meade PEDLAR, FNP  cephALEXin  (KEFLEX ) 500 MG capsule Take 1 capsule (500 mg total) by mouth 4 (four) times daily. 05/25/24   Suellen Cantor A, PA-C  Cholecalciferol  (VITAMIN D ) 2000 units CAPS Take 2,000 Units by mouth daily.    [provider]  Evolocumab  (REPATHA  SURECLICK) 140 MG/ML SOAJ Inject 140 mg into the skin every 14 (fourteen) days. 06/01/23   Bacchus, Meade PEDLAR, FNP  ezetimibe  (ZETIA ) 10 MG tablet Take 1 tablet (10 mg total) by mouth daily. 05/20/24   Bacchus, Meade PEDLAR, FNP  furosemide  (LASIX ) 20 MG tablet Take 1 tablet (20 mg total) by mouth daily. 05/25/24   Suellen Cantor A, PA-C  gabapentin  (NEURONTIN ) 300 MG capsule Take 1 capsule (300 mg total) by mouth 2 (two) times daily. 03/18/24   Bacchus, Meade PEDLAR, FNP  hydrALAZINE  (APRESOLINE ) 100 MG tablet TAKE 1 TABLET THREE TIMES DAILY 03/10/24   Bacchus, Gloria Z, FNP  HYDROcodone -acetaminophen  (NORCO/VICODIN) 5-325 MG tablet Take 1 tablet by mouth every 6 (six) hours as needed. 05/25/24   Suellen Cantor A, PA-C  losartan -hydrochlorothiazide (HYZAAR) 100-25 MG tablet TAKE 1 TABLET EVERY DAY 01/28/24   Bacchus, Gloria Z, FNP  metoprolol  tartrate (LOPRESSOR ) 25 MG tablet TAKE 1 TABLET TWICE DAILY 08/19/22    Bacchus, Gloria Z, FNP  oxyCODONE -acetaminophen  (PERCOCET/ROXICET) 5-325 MG tablet Take 1 tablet by mouth every 8 (eight) hours as needed for severe pain (pain score 7-10). 04/08/24   Del Wilhelmena Lloyd Sola, FNP  potassium chloride  SA (KLOR-CON  M) 20 MEQ tablet TAKE 1 TABLET TWICE DAILY 09/26/24   Edman Meade PEDLAR, FNP    Physical Exam: BP (!) 145/111   Pulse 77   Temp 98.2 F (36.8 C)   Resp 11   Ht 5' 7 (1.702 m)   Wt 88 kg   SpO2 97%   BMI 30.39 kg/m   General: 64 y.o. year-old male well developed well nourished in no acute distress.  Alert and oriented x3. Cardiovascular: Irregular rate and rhythm with no rubs or gallops.  No thyromegaly or JVD noted.  Trace lower extremity edema bilaterally. Respiratory: Clear to auscultation with no wheezes or rales. Good inspiratory effort. Abdomen: Soft nontender nondistended with normal bowel sounds x4 quadrants. Muskuloskeletal: No cyanosis or clubbing noted bilaterally Neuro: CN II-XII intact, strength, sensation, reflexes Skin: No ulcerative lesions noted or  rashes Psychiatry: Judgement and insight appear normal. Mood is appropriate for condition and setting          Labs on Admission:  Basic Metabolic Panel: Recent Labs  Lab 11/01/24 1803  NA 140  K 3.3*  CL 102  CO2 22  GLUCOSE 101*  BUN 32*  CREATININE 1.97*  CALCIUM  8.8*  MG 1.7   Liver Function Tests: Recent Labs  Lab 11/01/24 1803  AST 32  ALT 25  ALKPHOS 105  BILITOT 1.1  PROT 6.7  ALBUMIN 3.5   Recent Labs  Lab 11/01/24 1803  LIPASE 31   No results for input(s): AMMONIA in the last 168 hours. CBC: Recent Labs  Lab 11/01/24 1803  WBC 10.8*  NEUTROABS 8.2*  HGB 13.2  HCT 40.6  MCV 85.8  PLT 341   Cardiac Enzymes: No results for input(s): CKTOTAL, CKMB, CKMBINDEX, TROPONINI in the last 168 hours.  BNP (last 3 results) Recent Labs    05/25/24 1754  BNP 297.0*    ProBNP (last 3 results) No results for input(s): PROBNP in  the last 8760 hours.  CBG: No results for input(s): GLUCAP in the last 168 hours.  Radiological Exams on Admission: CT ABDOMEN PELVIS W CONTRAST Result Date: 11/01/2024 EXAM: CT ABDOMEN AND PELVIS WITH CONTRAST 11/01/2024 07:58:39 PM TECHNIQUE: CT of the abdomen and pelvis was performed with the administration of intravenous contrast. Multiplanar reformatted images are provided for review. Automated exposure control, iterative reconstruction, and/or weight-based adjustment of the mA/kV was utilized to reduce the radiation dose to as low as reasonably achievable. COMPARISON: None available. CLINICAL HISTORY: FINDINGS: LOWER CHEST: Trace right pleural effusion. LIVER: The liver is unremarkable. GALLBLADDER AND BILE DUCTS: Multiple small gallstones without associated inflammatory changes. No biliary ductal dilatation. SPLEEN: No acute abnormality. PANCREAS: No acute abnormality. ADRENAL GLANDS: No acute abnormality. KIDNEYS, URETERS AND BLADDER: Subcentimeter bilateral renal cysts, benign (Bosniak 1). No stones in the kidneys or ureters. No hydronephrosis. No perinephric or periureteral stranding. Urinary bladder is unremarkable. GI AND BOWEL: Normal appendix (image 48). Stomach demonstrates no acute abnormality. There is no bowel obstruction. PERITONEUM AND RETROPERITONEUM: No ascites. No free air. Small fat containing umbilical hernia. VASCULATURE: Moderate atherosclerotic calcification of the abdominal aorta without aneurysm. LYMPH NODES: No lymphadenopathy. REPRODUCTIVE ORGANS: Prostate is unremarkable. BONES AND SOFT TISSUES: No acute osseous abnormality. Tiny fat-containing bilateral inguinal hernias. IMPRESSION: 1. No acute abdominopelvic abnormality. 2. Cholelithiasis, without CT evidence of acute cholecystitis. 3. Aortic Atherosclerosis (ICD10-I70.0). Electronically signed by: Pinkie Pebbles MD 11/01/2024 08:07 PM EST RP Workstation: [id number]   CT Angio Chest PE W/Cm &/Or Wo Cm Result Date:  11/01/2024 EXAM: CTA of the Chest with contrast for PE 11/01/2024 07:58:39 PM TECHNIQUE: CTA of the chest was performed after the administration of intravenous contrast. Multiplanar reformatted images are provided for review. MIP images are provided for review. Automated exposure control, iterative reconstruction, and/or weight based adjustment of the mA/kV was utilized to reduce the radiation dose to as low as reasonably achievable. COMPARISON: None available. CLINICAL HISTORY: SOB, right sided pain, lethargy FINDINGS: PULMONARY ARTERIES: Pulmonary arteries are adequately opacified for evaluation. No pulmonary embolism. Main pulmonary artery is normal in caliber. MEDIASTINUM: The heart and pericardium demonstrate no acute abnormality. Mild thoracic aortic atherosclerosis. There is no acute abnormality of the thoracic aorta. LYMPH NODES: No mediastinal, hilar or axillary lymphadenopathy. LUNGS AND PLEURA: Mild subpleural scarring/atelectasis in the posterior right upper lobe and bilateral lower lobes. Trace right pleural effusion. No pneumothorax. UPPER ABDOMEN: Limited  images of the upper abdomen are unremarkable. SOFT TISSUES AND BONES: No acute bone or soft tissue abnormality. IMPRESSION: 1. No pulmonary embolism. 2. Trace right pleural effusion. 3. Aortic Atherosclerosis (ICD10-I70.0). Electronically signed by: Pinkie Pebbles MD 11/01/2024 08:05 PM EST RP Workstation: [id number]    EKG: I independently viewed the EKG done and my findings are as followed: Atrial fibrillation rate of 80.  QTc 483.  Assessment/Plan Present on Admission:  Atrial fibrillation (HCC)  Principal Problem:   Atrial fibrillation (HCC)  Newly diagnosed new onset atrial fibrillation, POA Admission 12-lead EKG showed A-fib with rate of 80. Currently rate controlled CHA2DS2-VASc score 3 Currently on heparin  drip, started in the ER Follow transthoracic echocardiogram. Most recent TSH 0.992 on 01/25/2024. Continue to monitor  on telemetry Consider cardiology consultation in the morning.  Elevated troponin, suspect demand ischemia in the setting of the above High-sensitivity troponin elevated and flat 132, 131 No evidence of acute ischemia on twelve-lead EKG Follow transthoracic echocardiogram Monitor on telemetry and consult cardiology in the morning  AKI, prerenal in the setting of dehydration from poor oral intake At baseline, creatinine 0.97 with GFR greater than 60 Presented with creatinine 1.97 and GFR of 38. Avoid nephrotoxic agents, dehydration, hypotension Monitor urine output Repeat BMP in the morning.  Right flank pain, suspect musculoskeletal UA negative for pyuria CT abdomen pelvis with contrast was nonacute. As needed analgesics  Hypertension BP is not at goal, elevated Start low-dose p.o. Lopressor  Closely monitor vital signs.  History of CVA 18 years ago with left-sided deficit Resume home aspirin , Lipitor and Zetia  Fall precautions.  Hyperlipidemia Resume home Zetia  and Lipitor  Generalized weakness PT OT evaluation Fall precautions.  Obesity BMI 30 Recommend weight loss outpatient with regular physical activity and healthy dieting.  Hypokalemia Serum potassium 3.3 Repleted orally Repeat BMP in the morning Check magnesium level.   Critical care time: 55 minutes.   DVT prophylaxis: Heparin  drip.  Code Status: Full code.  Family Communication: Patient's brother at bedside.  Disposition Plan: Admitted to telemetry unit.  Consults called: None.  Admission status: Inpatient status.   Status is: Inpatient status. The patient requires at least 2 midnights for further evaluation and treatment of present condition.   Terry LOISE Hurst MD [facility] Pager [phone]  If 7PM-7AM, please contact night-coverage [email or web address] Password [employer]  11/01/2024, 8:48 PM      [1] No Known Allergies  "

## 2024-11-01 NOTE — ED Provider Notes (Signed)
 " [place] EMERGENCY DEPARTMENT AT [facility] Provider Note   CSN: [id number] Arrival date & time: 11/01/24  1711     Patient presents with: Flank Pain (/)   Scott Ritter is a 64 y.o. male.   Patient is a 64 year old male who presents emergency department chief complaint of right abdominal pain and flank pain for approximate the past 3 days.  He also notes that he has been experiencing some intermittent shortness of breath and has been feeling overall weak.  He denies any dysuria or hematuria.  He does admit to lack of p.o. intake.  There has been no associated nausea or vomiting.  He denies any headaches, dizziness, lightheadedness, syncope.  There has been no pain to neck or back.   Flank Pain       Prior to Admission medications  Medication Sig Start Date End Date Taking? Authorizing Provider  amLODipine  (NORVASC ) 10 MG tablet TAKE 1 TABLET EVERY DAY 08/19/22   Bacchus, Meade PEDLAR, FNP  aspirin  EC 81 MG EC tablet Take 1 tablet (81 mg total) by mouth daily. Swallow whole. 10/25/21   Maree, Pratik D, DO  atorvastatin  (LIPITOR) 80 MG tablet TAKE 1 TABLET (80 MG TOTAL) BY MOUTH DAILY. 03/10/24   Bacchus, Meade PEDLAR, FNP  cephALEXin  (KEFLEX ) 500 MG capsule Take 1 capsule (500 mg total) by mouth 4 (four) times daily. 05/25/24   Suellen Cantor A, PA-C  Cholecalciferol  (VITAMIN D ) 2000 units CAPS Take 2,000 Units by mouth daily.    [provider]  Evolocumab  (REPATHA  SURECLICK) 140 MG/ML SOAJ Inject 140 mg into the skin every 14 (fourteen) days. 06/01/23   Bacchus, Meade PEDLAR, FNP  ezetimibe  (ZETIA ) 10 MG tablet Take 1 tablet (10 mg total) by mouth daily. 05/20/24   Bacchus, Meade PEDLAR, FNP  furosemide  (LASIX ) 20 MG tablet Take 1 tablet (20 mg total) by mouth daily. 05/25/24   Suellen Cantor A, PA-C  gabapentin  (NEURONTIN ) 300 MG capsule Take 1 capsule (300 mg total) by mouth 2 (two) times daily. 03/18/24   Bacchus, Meade PEDLAR, FNP  hydrALAZINE  (APRESOLINE ) 100 MG tablet TAKE 1  TABLET THREE TIMES DAILY 03/10/24   Bacchus, Gloria Z, FNP  HYDROcodone -acetaminophen  (NORCO/VICODIN) 5-325 MG tablet Take 1 tablet by mouth every 6 (six) hours as needed. 05/25/24   Suellen Cantor A, PA-C  losartan -hydrochlorothiazide (HYZAAR) 100-25 MG tablet TAKE 1 TABLET EVERY DAY 01/28/24   Bacchus, Gloria Z, FNP  metoprolol  tartrate (LOPRESSOR ) 25 MG tablet TAKE 1 TABLET TWICE DAILY 08/19/22   Bacchus, Gloria Z, FNP  oxyCODONE -acetaminophen  (PERCOCET/ROXICET) 5-325 MG tablet Take 1 tablet by mouth every 8 (eight) hours as needed for severe pain (pain score 7-10). 04/08/24   Del Wilhelmena Lloyd Sola, FNP  potassium chloride  SA (KLOR-CON  M) 20 MEQ tablet TAKE 1 TABLET TWICE DAILY 09/26/24   Bacchus, Gloria Z, FNP    Allergies: Patient has no known allergies.    Review of Systems  Genitourinary:  Positive for flank pain.  All other systems reviewed and are negative.   Updated Vital Signs BP (!) 146/97   Pulse 78   Ht 5' 7 (1.702 m)   Wt 88 kg   SpO2 93%   BMI 30.39 kg/m   Physical Exam Vitals and nursing note reviewed.  Constitutional:      General: He is not in acute distress.    Appearance: Normal appearance. He is not ill-appearing.  HENT:     Head: Normocephalic and atraumatic.  Nose: Nose normal.     Mouth/Throat:     Mouth: Mucous membranes are moist.  Eyes:     Extraocular Movements: Extraocular movements intact.     Conjunctiva/sclera: Conjunctivae normal.     Pupils: Pupils are equal, round, and reactive to light.  Cardiovascular:     Rate and Rhythm: Normal rate and regular rhythm.     Pulses: Normal pulses.     Heart sounds: Normal heart sounds. No murmur heard.    No gallop.  Pulmonary:     Effort: Pulmonary effort is normal. No respiratory distress.     Breath sounds: Normal breath sounds. No stridor. No wheezing, rhonchi or rales.  Abdominal:     General: Abdomen is flat. Bowel sounds are normal. There is no distension.     Palpations: Abdomen is soft.      Tenderness: There is no guarding.     Comments: Tender to palpation of right side abdomen and flank  Musculoskeletal:        General: No swelling, tenderness, deformity or signs of injury. Normal range of motion.     Cervical back: Normal range of motion and neck supple.  Skin:    General: Skin is warm and dry.     Coloration: Skin is not jaundiced.     Findings: No bruising or rash.  Neurological:     General: No focal deficit present.     Mental Status: He is alert and oriented to person, place, and time. Mental status is at baseline.     Cranial Nerves: No cranial nerve deficit.     Sensory: No sensory deficit.     Motor: No weakness.     Coordination: Coordination normal.     Gait: Gait normal.  Psychiatric:        Mood and Affect: Mood normal.        Behavior: Behavior normal.        Thought Content: Thought content normal.        Judgment: Judgment normal.     (all labs ordered are listed, but only abnormal results are displayed) Labs Reviewed  CBC WITH DIFFERENTIAL/PLATELET - Abnormal; Notable for the following components:      Result Value   WBC 10.8 (*)    Neutro Abs 8.2 (*)    All other components within normal limits  COMPREHENSIVE METABOLIC PANEL WITH GFR  LIPASE, BLOOD  URINALYSIS, ROUTINE W REFLEX MICROSCOPIC  TROPONIN T, HIGH SENSITIVITY    EKG: None  Radiology: No results found.   Procedures   Medications Ordered in the ED - No data to display                                  Medical Decision Making Amount and/or Complexity of Data Reviewed Labs: ordered. Radiology: ordered.  Risk Prescription drug management. Decision regarding hospitalization.   This patient presents to the ED for concern of chest pain, flank pain, this involves an extensive number of treatment options, and is a complaint that carries with it a high risk of complications and morbidity.  The differential diagnosis includes ACS, pulmonary embolus, pericarditis,  myocarditis, endocarditis, aortic aneurysm or dissection, pneumonia, pneumothorax, hemothorax, acute appendicitis, cholecystitis, bowel striction, diverticulitis, testicular torsion, pyelonephritis, kidney stone, pancreatitis, mesenteric ischemia, atrial fibrillation, electrolyte derangement, acute kidney injury   Co morbidities that complicate the patient evaluation  CVA, hypertension   Additional history obtained:  Additional history  obtained from none  obtained and reviewed including medical records   Lab Tests:  I Ordered, and personally interpreted labs.  The pertinent results include:  mild leukocytosis, hypokalemia, elevated creatinine, normal liver function, elevated troponin, normal lipase, normal magnesium External records from outside source   Imaging Studies ordered:  I ordered imaging studies including CTA chest, CT abdomen and pelvis I independently visualized and interpreted imaging which showed no acute cardiopulmonary process, no pulmonary embolus, cholelithiasis without cholecystitis I agree with the radiologist interpretation   Cardiac Monitoring: / EKG:  The patient was maintained on a cardiac monitor.  I personally viewed and interpreted the cardiac monitored which showed an underlying rhythm of: Atrial fibrillation, no ST/T wave changes, no ischemic changes, no STEMI   Consultations Obtained:  I requested consultation with the bolused,  and discussed lab and imaging findings as well as pertinent plan - they recommend: Admission   Problem List / ED Course / Critical interventions / Medication management  Patient does remain stable at this time.  Discussed with patient we will plan for admission to the hospital service given his new onset atrial fibrillation and acute kidney injury.  He has been given IV fluids in the emergency department.  Potassium has been repleted as well.  Suspect that his elevated troponin is secondary to demand from his acute kidney  injury and new onset atrial fibrillation.  He has no indication for pulmonary embolus at this time and CT scan of abdomen pelvis demonstrated no acute surgical process.  Have discussed patient case with Dr. Shona with the hospital service who has excepted for admission. I ordered medication including IV fluids, potassium for acute kidney injury, hypokalemia Reevaluation of the patient after these medicines showed that the patient improved I have reviewed the patients home medicines and have made adjustments as needed   Social Determinants of Health:  None   Test / Admission - Considered:  Admission     Final diagnoses:  None    ED Discharge Orders     None          Daralene Lonni JONETTA DEVONNA 11/01/24 2139    Dean Clarity, MD 11/01/24 2257  "

## 2024-11-02 ENCOUNTER — Inpatient Hospital Stay (HOSPITAL_COMMUNITY)

## 2024-11-02 ENCOUNTER — Other Ambulatory Visit (HOSPITAL_COMMUNITY): Payer: Self-pay | Admitting: *Deleted

## 2024-11-02 DIAGNOSIS — I5021 Acute systolic (congestive) heart failure: Secondary | ICD-10-CM | POA: Diagnosis not present

## 2024-11-02 DIAGNOSIS — I4891 Unspecified atrial fibrillation: Secondary | ICD-10-CM

## 2024-11-02 DIAGNOSIS — I480 Paroxysmal atrial fibrillation: Secondary | ICD-10-CM | POA: Diagnosis not present

## 2024-11-02 DIAGNOSIS — N179 Acute kidney failure, unspecified: Secondary | ICD-10-CM | POA: Diagnosis present

## 2024-11-02 LAB — CBC WITH DIFFERENTIAL/PLATELET
Abs Immature Granulocytes: 0.04 K/uL (ref 0.00–0.07)
Basophils Absolute: 0.1 K/uL (ref 0.0–0.1)
Basophils Relative: 1 %
Eosinophils Absolute: 0.1 K/uL (ref 0.0–0.5)
Eosinophils Relative: 1 %
HCT: 37.7 % — ABNORMAL LOW (ref 39.0–52.0)
Hemoglobin: 12.3 g/dL — ABNORMAL LOW (ref 13.0–17.0)
Immature Granulocytes: 0 %
Lymphocytes Relative: 19 %
Lymphs Abs: 2 K/uL (ref 0.7–4.0)
MCH: 28.1 pg (ref 26.0–34.0)
MCHC: 32.6 g/dL (ref 30.0–36.0)
MCV: 86.3 fL (ref 80.0–100.0)
Monocytes Absolute: 0.8 K/uL (ref 0.1–1.0)
Monocytes Relative: 7 %
Neutro Abs: 7.6 K/uL (ref 1.7–7.7)
Neutrophils Relative %: 72 %
Platelets: 311 K/uL (ref 150–400)
RBC: 4.37 MIL/uL (ref 4.22–5.81)
RDW: 15.7 % — ABNORMAL HIGH (ref 11.5–15.5)
WBC: 10.6 K/uL — ABNORMAL HIGH (ref 4.0–10.5)
nRBC: 0 % (ref 0.0–0.2)

## 2024-11-02 LAB — ECHOCARDIOGRAM COMPLETE
AR max vel: 1.67 cm2
AV Area VTI: 1.61 cm2
AV Area mean vel: 1.65 cm2
AV Mean grad: 2 mmHg
AV Peak grad: 3.7 mmHg
Ao pk vel: 0.97 m/s
Area-P 1/2: 5.02 cm2
Calc EF: 35.3 %
Height: 67 in
MV M vel: 5.27 m/s
MV Peak grad: 111.1 mmHg
S' Lateral: 4.7 cm
Single Plane A2C EF: 31.3 %
Single Plane A4C EF: 38.3 %
Weight: 2939.2 [oz_av]

## 2024-11-02 LAB — BASIC METABOLIC PANEL WITH GFR
Anion gap: 15 (ref 5–15)
BUN: 31 mg/dL — ABNORMAL HIGH (ref 8–23)
CO2: 21 mmol/L — ABNORMAL LOW (ref 22–32)
Calcium: 8.9 mg/dL (ref 8.9–10.3)
Chloride: 104 mmol/L (ref 98–111)
Creatinine, Ser: 1.96 mg/dL — ABNORMAL HIGH (ref 0.61–1.24)
GFR, Estimated: 38 mL/min — ABNORMAL LOW
Glucose, Bld: 94 mg/dL (ref 70–99)
Potassium: 3.3 mmol/L — ABNORMAL LOW (ref 3.5–5.1)
Sodium: 140 mmol/L (ref 135–145)

## 2024-11-02 LAB — HIV ANTIBODY (ROUTINE TESTING W REFLEX): HIV Screen 4th Generation wRfx: NONREACTIVE

## 2024-11-02 LAB — HEPARIN LEVEL (UNFRACTIONATED)
Heparin Unfractionated: 0.64 [IU]/mL (ref 0.30–0.70)
Heparin Unfractionated: 0.8 [IU]/mL — ABNORMAL HIGH (ref 0.30–0.70)
Heparin Unfractionated: 0.6 [IU]/mL (ref 0.30–0.70)

## 2024-11-02 LAB — MAGNESIUM: Magnesium: 1.9 mg/dL (ref 1.7–2.4)

## 2024-11-02 LAB — PHOSPHORUS: Phosphorus: 3.2 mg/dL (ref 2.5–4.6)

## 2024-11-02 MED ORDER — HEPARIN (PORCINE) 25000 UT/250ML-% IV SOLN
1100.0000 [IU]/h | INTRAVENOUS | Status: DC
  Administered 2024-11-03: 1100 [IU]/h via INTRAVENOUS
  Filled 2024-11-02: qty 250

## 2024-11-02 MED ORDER — METOPROLOL TARTRATE 25 MG PO TABS
12.5000 mg | ORAL_TABLET | Freq: Two times a day (BID) | ORAL | Status: DC
  Administered 2024-11-02 (×2): 12.5 mg via ORAL
  Filled 2024-11-02 (×2): qty 1

## 2024-11-02 MED ORDER — APIXABAN 5 MG PO TABS: 5.0000 mg | ORAL_TABLET | Freq: Two times a day (BID) | ORAL | Status: DC

## 2024-11-02 MED ORDER — HYDRALAZINE HCL 25 MG PO TABS
25.0000 mg | ORAL_TABLET | Freq: Three times a day (TID) | ORAL | Status: DC
  Administered 2024-11-02 – 2024-11-03 (×3): 25 mg via ORAL
  Filled 2024-11-02 (×3): qty 1

## 2024-11-02 MED ORDER — POTASSIUM CHLORIDE CRYS ER 20 MEQ PO TBCR
40.0000 meq | EXTENDED_RELEASE_TABLET | Freq: Once | ORAL | Status: AC
  Administered 2024-11-02: 40 meq via ORAL
  Filled 2024-11-02: qty 2

## 2024-11-02 MED ORDER — ASPIRIN 81 MG PO TBEC
81.0000 mg | DELAYED_RELEASE_TABLET | Freq: Every day | ORAL | Status: DC
  Administered 2024-11-02 – 2024-11-04 (×3): 81 mg via ORAL
  Filled 2024-11-02 (×3): qty 1

## 2024-11-02 MED ORDER — CARVEDILOL 3.125 MG PO TABS
3.1250 mg | ORAL_TABLET | Freq: Two times a day (BID) | ORAL | Status: DC
  Administered 2024-11-02 – 2024-11-04 (×4): 3.125 mg via ORAL
  Filled 2024-11-02 (×4): qty 1

## 2024-11-02 MED ORDER — ISOSORBIDE MONONITRATE ER 30 MG PO TB24
30.0000 mg | ORAL_TABLET | Freq: Every day | ORAL | Status: DC
  Administered 2024-11-02 – 2024-11-05 (×4): 30 mg via ORAL
  Filled 2024-11-02 (×4): qty 1

## 2024-11-02 NOTE — Progress Notes (Signed)
" °  Transition of Care [facility]) Screening Note   Patient Details  Name: Scott Ritter Date of Birth: Mar 10, 1961   Transition of Care [facility]) CM/SW Contact:    Hoy DELENA Bigness, LCSW Phone Number: 11/02/2024, 1:51 PM    Transition of Care Department [facility]) has reviewed patient and no TOC needs have been identified at this time. We will continue to monitor patient advancement through interdisciplinary progression rounds. If new patient transition needs arise, please place a TOC consult.    11/02/24 1350  TOC Brief Assessment  Insurance and Status Reviewed  Patient has primary care physician Yes  Home environment has been reviewed From home  Prior level of function: Independent  Prior/Current Home Services No current home services  Social Drivers of Health Review SDOH reviewed no interventions necessary  Readmission risk has been reviewed Yes  Transition of care needs no transition of care needs at this time    "

## 2024-11-02 NOTE — Progress Notes (Addendum)
 " PROGRESS NOTE     Scott Ritter, is a 64 y.o. male, DOB - 10-17-1960, FMW:[id number]  Admit date - 11/01/2024   Admitting Physician Terry LOISE Hurst, DO  Outpatient Primary MD for the patient is Bacchus, Meade PEDLAR, FNP  LOS - 1  Chief Complaint  Patient presents with   Flank Pain           Brief Narrative:  64 y.o. 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, hypertension, obesity, admitted on 11/01/2024 with new onset A-fib and AKI    -Assessment and Plan: 1)HFrEF--- acute systolic dysfunction CHF-- -echo from 11/02/2024 showed EF of 20 to 25%, global hypokinesis, pulmonary hypertension, no aortic stenosis, no mitral stenosis - Please note that echo from January 2023 had EF of 60 to 65% without wall motion abnormalities at that time -- Official cardiology consult requested --CTA chest on admission without acute findings --Drop in EF could be tachycardia mediated in the setting of new onset A-fib - Coreg , isosorbide /hydralazine  combo as ordered avoid ACEI/ARB/ARNI due to renal concerns  2)PAFIb--new onset A-fib, A-fib may be contributing to decreased EF as above in #1 -TSH pending - - Coreg  for rate control IV heparin  for stroke prophylaxis - Anticipate transition to Eliquis  with no cardiac interventions planned  3)History of prior stroke about 18 years ago with residual mild left-sided hemiparesis -- Continue aspirin  and atorvastatin  for secondary stroke prophylaxis -- Okay to stop aspirin  once patient is transitioned to Eliquis  for A-fib  4)AKI----acute kidney injury-suspect due to A-fib with RVR with hemodynamic instability and hypoperfusion -   creatinine on admission= 1.97  ,  baseline creatinine = 0.9 to 1.1 (August 2025)    ,  creatinine is now=1.96  , -- renally adjust medications, avoid nephrotoxic agents / dehydration  / hypotension  5) hypokalemia--replace and recheck - Magnesium WNL  Status is: Inpatient    Disposition: The patient is from: Home              Anticipated d/c is to: Home              Anticipated d/c date is: 2 days              Patient currently is not medically stable to d/c. Barriers: Not Clinically Stable-   Code Status :  -  Code Status: Full Code   Family Communication:   NA (patient is alert, awake and coherent)   DVT Prophylaxis  :   - SCDs /Iv Heparin      Lab Results  Component Value Date   PLT 311 11/02/2024    Inpatient Medications  Scheduled Meds:  aspirin  EC  81 mg Oral Daily   atorvastatin   80 mg Oral Daily   carvedilol   3.125 mg Oral BID WC   ezetimibe   10 mg Oral Daily   hydrALAZINE   25 mg Oral TID   isosorbide  mononitrate  30 mg Oral Daily   potassium chloride   20 mEq Oral Once   potassium chloride   40 mEq Oral Once   Continuous Infusions:  heparin  1,100 Units/hr (11/02/24 1815)   PRN Meds:.acetaminophen , melatonin, polyethylene glycol, prochlorperazine    Anti-infectives (From admission, onward)    None       Subjective: Kay Converse today has no fevers, no emesis,  No chest pain,   - No dyspnea at rest, mild dyspnea with ambulation noted -No hypoxia even with ambulation   Objective: Vitals:   11/02/24 0807 11/02/24  0915 11/02/24 1133 11/02/24 1713  BP: (!) 175/133 (!) 154/111 (!) 153/100 (!) 152/127  Pulse: 99 81 73   Resp: 18  18   Temp: 98.3 F (36.8 C)  98.2 F (36.8 C) 98.1 F (36.7 C)  TempSrc: Oral  Oral Oral  SpO2: 96%  97% 97%  Weight:      Height:        Intake/Output Summary (Last 24 hours) at 11/02/2024 1905 Last data filed at 11/02/2024 1815 Gross per 24 hour  Intake 829.97 ml  Output 150 ml  Net 679.97 ml   Filed Weights   11/01/24 1748 11/01/24 2238 11/02/24 0528  Weight: 88 kg 84.3 kg 83.3 kg    Physical Exam  Gen:- Awake Alert, no acute distress HEENT:- [place].AT, No sclera icterus Neck-Supple Neck,No JVD,.  Lungs-  CTAB , fair symmetrical air movement CV- S1, S2 normal, irregularly  irregular  abd-  +ve B.Sounds, Abd Soft, No tenderness,    Extremity/Skin:- No  edema, pedal pulses present  Psych-affect is appropriate, oriented x3 Neuro-mild residual left-sided deficits otherwise, no additional new focal deficits, no tremors  Data Reviewed: I have personally reviewed following labs and imaging studies  CBC: Recent Labs  Lab 11/01/24 1803 11/02/24 0408  WBC 10.8* 10.6*  NEUTROABS 8.2* 7.6  HGB 13.2 12.3*  HCT 40.6 37.7*  MCV 85.8 86.3  PLT 341 311   Basic Metabolic Panel: Recent Labs  Lab 11/01/24 1803 11/02/24 0408  NA 140 140  K 3.3* 3.3*  CL 102 104  CO2 22 21*  GLUCOSE 101* 94  BUN 32* 31*  CREATININE 1.97* 1.96*  CALCIUM  8.8* 8.9  MG 1.7 1.9  PHOS  --  3.2   GFR: Estimated Creatinine Clearance: 40.9 mL/min (A) (by C-G formula based on SCr of 1.96 mg/dL (H)). Liver Function Tests: Recent Labs  Lab 11/01/24 1803  AST 32  ALT 25  ALKPHOS 105  BILITOT 1.1  PROT 6.7  ALBUMIN 3.5   Radiology Studies: ECHOCARDIOGRAM COMPLETE Result Date: 11/02/2024    ECHOCARDIOGRAM REPORT   Patient Name:   Scott OHALLORAN Date of Exam: 11/02/2024 Medical Rec #:  [id number]          Height:       66.9 in Accession #:    [id number]         Weight:       183.6 lb Date of Birth:  23-Oct-1960          BSA:          1.949 m Patient Age:    63 years           BP:           153/100 mmHg Patient Gender: M                  HR:           73 bpm. Exam Location:  [facility] Procedure: 2D Echo, 3D Echo, Cardiac Doppler, Color Doppler and Strain Analysis            (Both Spectral and Color Flow Doppler were utilized during            procedure). Indications:    Atrial Fibrillation I48.91  History:        Patient has prior history of Echocardiogram examinations, most                 recent 10/23/2021. Risk Factors:Hypertension and Dyslipidemia.  Sonographer:  Aida Pizza RCS Referring Phys: [id number] CAROLE N HALL IMPRESSIONS  1. Left ventricular ejection fraction, by  estimation, is 20 to 25%. The left ventricle has severely decreased function. The left ventricle demonstrates global hypokinesis. There is mild left ventricular hypertrophy. Left ventricular diastolic parameters  are indeterminate. The average left ventricular global longitudinal strain is -4.8 %. The global longitudinal strain is abnormal.  2. Right ventricular systolic function is mildly reduced. The right ventricular size is normal. There is moderately elevated pulmonary artery systolic pressure.  3. Left atrial size was severely dilated.  4. Right atrial size was mildly dilated.  5. The mitral valve is abnormal. Mild mitral valve regurgitation. No evidence of mitral stenosis.  6. The tricuspid valve is abnormal.  7. The aortic valve is tricuspid. Aortic valve regurgitation is not visualized. No aortic stenosis is present.  8. The inferior vena cava is dilated in size with <50% respiratory variability, suggesting right atrial pressure of 15 mmHg. FINDINGS  Left Ventricle: Left ventricular ejection fraction, by estimation, is 20 to 25%. The left ventricle has severely decreased function. The left ventricle demonstrates global hypokinesis. The average left ventricular global longitudinal strain is -4.8 %. Strain was performed and the global longitudinal strain is abnormal. The left ventricular internal cavity size was normal in size. There is mild left ventricular hypertrophy. Left ventricular diastolic parameters are indeterminate. Right Ventricle: The right ventricular size is normal. Right vetricular wall thickness was not well visualized. Right ventricular systolic function is mildly reduced. There is moderately elevated pulmonary artery systolic pressure. The tricuspid regurgitant velocity is 3.22 m/s, and with an assumed right atrial pressure of 15 mmHg, the estimated right ventricular systolic pressure is 56.5 mmHg. Left Atrium: Left atrial size was severely dilated. Right Atrium: Right atrial size was  mildly dilated. Pericardium: There is no evidence of pericardial effusion. Mitral Valve: The mitral valve is abnormal. Mild mitral valve regurgitation. No evidence of mitral valve stenosis. Tricuspid Valve: The tricuspid valve is abnormal. Tricuspid valve regurgitation is mild . No evidence of tricuspid stenosis. Aortic Valve: The aortic valve is tricuspid. Aortic valve regurgitation is not visualized. No aortic stenosis is present. Aortic valve mean gradient measures 2.0 mmHg. Aortic valve peak gradient measures 3.7 mmHg. Aortic valve area, by VTI measures 1.61 cm. Pulmonic Valve: The pulmonic valve was not well visualized. Pulmonic valve regurgitation is mild to moderate. No evidence of pulmonic stenosis. Aorta: The aortic root is normal in size and structure and the ascending aorta was not well visualized. Venous: The inferior vena cava is dilated in size with less than 50% respiratory variability, suggesting right atrial pressure of 15 mmHg. IAS/Shunts: No atrial level shunt detected by color flow Doppler.  LEFT VENTRICLE PLAX 2D LVIDd:         5.60 cm LVIDs:         4.70 cm      2D Longitudinal Strain LV PW:         1.10 cm      2D Strain GLS Avg:     -4.8 % LV IVS:        1.00 cm LVOT diam:     2.10 cm LV SV:         29 LV SV Index:   15           3D Volume EF: LVOT Area:     3.46 cm     3D EF:        35 %  LV EDV:       190 ml                             LV ESV:       122 ml LV Volumes (MOD)            LV SV:        67 ml LV vol d, MOD A2C: 147.0 ml LV vol d, MOD A4C: 188.0 ml LV vol s, MOD A2C: 101.0 ml LV vol s, MOD A4C: 116.0 ml LV SV MOD A2C:     46.0 ml LV SV MOD A4C:     188.0 ml LV SV MOD BP:      60.6 ml RIGHT VENTRICLE RV S prime:     7.46 cm/s TAPSE (M-mode): 1.3 cm LEFT ATRIUM              Index        RIGHT ATRIUM           Index LA diam:        5.00 cm  2.57 cm/m   RA Area:     22.50 cm LA Vol (A2C):   177.0 ml 90.84 ml/m  RA Volume:   71.10 ml  36.49 ml/m LA Vol  (A4C):   137.0 ml 70.31 ml/m LA Biplane Vol: 156.0 ml 80.06 ml/m  AORTIC VALVE AV Area (Vmax):    1.67 cm AV Area (Vmean):   1.65 cm AV Area (VTI):     1.61 cm AV Vmax:           96.63 cm/s AV Vmean:          65.033 cm/s AV VTI:            0.179 m AV Peak Grad:      3.7 mmHg AV Mean Grad:      2.0 mmHg LVOT Vmax:         46.59 cm/s LVOT Vmean:        31.033 cm/s LVOT VTI:          0.083 m LVOT/AV VTI ratio: 0.46  AORTA Ao Root diam: 3.30 cm MITRAL VALVE                TRICUSPID VALVE MV Area (PHT): 5.02 cm     TR Peak grad:   41.5 mmHg MV Decel Time: 151 msec     TR Vmax:        322.00 cm/s MR Peak grad: 111.1 mmHg MR Mean grad: 67.0 mmHg     SHUNTS MR Vmax:      527.00 cm/s   Systemic VTI:  0.08 m MR Vmean:     378.0 cm/s    Systemic Diam: 2.10 cm MV E velocity: 108.00 cm/s Dorn Ross MD Electronically signed by Dorn Ross MD Signature Date/Time: 11/02/2024/3:48:18 PM    Final    CT ABDOMEN PELVIS W CONTRAST Result Date: 11/01/2024 EXAM: CT ABDOMEN AND PELVIS WITH CONTRAST 11/01/2024 07:58:39 PM TECHNIQUE: CT of the abdomen and pelvis was performed with the administration of intravenous contrast. Multiplanar reformatted images are provided for review. Automated exposure control, iterative reconstruction, and/or weight-based adjustment of the mA/kV was utilized to reduce the radiation dose to as low as reasonably achievable. COMPARISON: None available. CLINICAL HISTORY: FINDINGS: LOWER CHEST: Trace right pleural effusion. LIVER: The liver is unremarkable. GALLBLADDER AND BILE DUCTS: Multiple small gallstones without associated inflammatory changes. No biliary ductal  dilatation. SPLEEN: No acute abnormality. PANCREAS: No acute abnormality. ADRENAL GLANDS: No acute abnormality. KIDNEYS, URETERS AND BLADDER: Subcentimeter bilateral renal cysts, benign (Bosniak 1). No stones in the kidneys or ureters. No hydronephrosis. No perinephric or periureteral stranding. Urinary bladder is unremarkable. GI AND  BOWEL: Normal appendix (image 48). Stomach demonstrates no acute abnormality. There is no bowel obstruction. PERITONEUM AND RETROPERITONEUM: No ascites. No free air. Small fat containing umbilical hernia. VASCULATURE: Moderate atherosclerotic calcification of the abdominal aorta without aneurysm. LYMPH NODES: No lymphadenopathy. REPRODUCTIVE ORGANS: Prostate is unremarkable. BONES AND SOFT TISSUES: No acute osseous abnormality. Tiny fat-containing bilateral inguinal hernias. IMPRESSION: 1. No acute abdominopelvic abnormality. 2. Cholelithiasis, without CT evidence of acute cholecystitis. 3. Aortic Atherosclerosis (ICD10-I70.0). Electronically signed by: Pinkie Pebbles MD 11/01/2024 08:07 PM EST RP Workstation: [id number]   CT Angio Chest PE W/Cm &/Or Wo Cm Result Date: 11/01/2024 EXAM: CTA of the Chest with contrast for PE 11/01/2024 07:58:39 PM TECHNIQUE: CTA of the chest was performed after the administration of intravenous contrast. Multiplanar reformatted images are provided for review. MIP images are provided for review. Automated exposure control, iterative reconstruction, and/or weight based adjustment of the mA/kV was utilized to reduce the radiation dose to as low as reasonably achievable. COMPARISON: None available. CLINICAL HISTORY: SOB, right sided pain, lethargy FINDINGS: PULMONARY ARTERIES: Pulmonary arteries are adequately opacified for evaluation. No pulmonary embolism. Main pulmonary artery is normal in caliber. MEDIASTINUM: The heart and pericardium demonstrate no acute abnormality. Mild thoracic aortic atherosclerosis. There is no acute abnormality of the thoracic aorta. LYMPH NODES: No mediastinal, hilar or axillary lymphadenopathy. LUNGS AND PLEURA: Mild subpleural scarring/atelectasis in the posterior right upper lobe and bilateral lower lobes. Trace right pleural effusion. No pneumothorax. UPPER ABDOMEN: Limited images of the upper abdomen are unremarkable. SOFT TISSUES AND BONES: No  acute bone or soft tissue abnormality. IMPRESSION: 1. No pulmonary embolism. 2. Trace right pleural effusion. 3. Aortic Atherosclerosis (ICD10-I70.0). Electronically signed by: Pinkie Pebbles MD 11/01/2024 08:05 PM EST RP Workstation: [id number]   Scheduled Meds:  aspirin  EC  81 mg Oral Daily   atorvastatin   80 mg Oral Daily   carvedilol   3.125 mg Oral BID WC   ezetimibe   10 mg Oral Daily   hydrALAZINE   25 mg Oral TID   isosorbide  mononitrate  30 mg Oral Daily   potassium chloride   20 mEq Oral Once   potassium chloride   40 mEq Oral Once   Continuous Infusions:  heparin  1,100 Units/hr (11/02/24 1815)    LOS: 1 day   Rendall Carwin M.D on 11/02/2024 at 7:05 PM  Go to [email or web address] - for contact info  [facility] - Office  [phone]  If 7PM-7AM, please contact night-coverage [email or web address] 11/02/2024, 7:05 PM    "

## 2024-11-02 NOTE — Consult Note (Signed)
 PHARMACY - ANTICOAGULATION CONSULT NOTE  Pharmacy Consult for Heparin  Indication: atrial fibrillation  Allergies[1]  Patient Measurements: Height: 5' 7 (170.2 cm) Weight: 83.3 kg (183 lb 11.2 oz) IBW/kg (Calculated) : 66.1 HEPARIN  DW (KG): 84.2  Vital Signs: Temp: 98.2 F (36.8 C) (01/21 1133) Temp Source: Oral (01/21 1133) BP: 153/100 (01/21 1133) Pulse Rate: 73 (01/21 1133)  Labs: Recent Labs    11/01/24 1803 11/02/24 0408 11/02/24 1050  HGB 13.2 12.3*  --   HCT 40.6 37.7*  --   PLT 341 311  --   APTT 27  --   --   LABPROT 13.9  --   --   INR 1.0  --   --   HEPARINUNFRC  --  0.64 0.80*  CREATININE 1.97* 1.96*  --     Estimated Creatinine Clearance: 40.9 mL/min (A) (by C-G formula based on SCr of 1.96 mg/dL (H)).   Medical History: Past Medical History:  Diagnosis Date   GERD (gastroesophageal reflux disease)    Hypertension    Stroke (HCC)    left side deficits    Medications:  No history of chronic anticoagulant use PTA  Assessment: 64 year old male who presents emergency department chief complaint of right abdominal pain and flank pain for approximate the past 3 days. Cardiac monitor showing atrial fibrillation. Pharmacy has been consulted to initiate and dose continuous heparin  infusion.  HL 0.80- supratherapeutic CBC WNL   Goal of Therapy:  Heparin  level 0.3-0.7 units/ml Monitor platelets by anticoagulation protocol: Yes   Plan:  Decrease heparin  infusion to 1100 units/hr Check anti-Xa level in 6 hours and daily while on heparin  Continue to monitor H&H and platelets  Elspeth Sour, PharmD Clinical Pharmacist 11/02/2024 12:04 PM        [1] No Known Allergies

## 2024-11-02 NOTE — Consult Note (Signed)
 PHARMACY - ANTICOAGULATION CONSULT NOTE  Pharmacy Consult for Heparin  Indication: atrial fibrillation  Allergies[1]  Patient Measurements: Height: 5' 7 (170.2 cm) Weight: 83.3 kg (183 lb 11.2 oz) IBW/kg (Calculated) : 66.1 HEPARIN  DW (KG): 84.2  Vital Signs: Temp: 97.8 F (36.6 C) (01/21 0528) Temp Source: Oral (01/21 0528) BP: 169/106 (01/21 0528) Pulse Rate: 95 (01/21 0528)  Labs: Recent Labs    11/01/24 1803 11/02/24 0408  HGB 13.2 12.3*  HCT 40.6 37.7*  PLT 341 311  APTT 27  --   LABPROT 13.9  --   INR 1.0  --   HEPARINUNFRC  --  0.64  CREATININE 1.97* 1.96*    Estimated Creatinine Clearance: 40.9 mL/min (A) (by C-G formula based on SCr of 1.96 mg/dL (H)).   Medical History: Past Medical History:  Diagnosis Date   GERD (gastroesophageal reflux disease)    Hypertension    Stroke (HCC)    left side deficits    Medications:  No history of chronic anticoagulant use PTA  Assessment: 64 year old male who presents emergency department chief complaint of right abdominal pain and flank pain for approximate the past 3 days. Cardiac monitor showing atrial fibrillation. Pharmacy has been consulted to initiate and dose continuous heparin  infusion.  HL 0.64- therapeutic CBC WNL   Goal of Therapy:  Heparin  level 0.3-0.7 units/ml Monitor platelets by anticoagulation protocol: Yes   Plan:  Continue heparin  infusion at 1250 units/hr Check anti-Xa level in 6 hours and daily while on heparin  Continue to monitor H&H and platelets  Elspeth Sour, PharmD Clinical Pharmacist 11/02/2024 7:43 AM       [1] No Known Allergies

## 2024-11-02 NOTE — Evaluation (Signed)
 Occupational Therapy Evaluation Patient Details Name: Scott Ritter MRN: [id number] DOB: 06-21-1961 Today's Date: 11/02/2024   History of Present Illness   Scott Ritter is a 64 y.o. 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  Also complains of intermittent sharp right flank pain lasting about 20 seconds then resolving spontaneously.  Endorses frequent urination.  No chest pain at home, however had an episode of nonradiating left-sided sharp chest pain while in the ER that lasted 10 to 15 seconds then resolved spontaneously.  The patient had no report of subjective fevers and chills.  He presented to the ER for further evaluation. (per DO)     Clinical Impressions Pt agreeable to OT and PT co-evaluation. Pt appears to be at or near functional baseline. Pt has limited functional use of L UE at baseline. Pt able to functionally ambulate without AD and no physical assist. Pt completed lower body dressing and standing grooming tasks without need for physical assist. Pt left in the chair with call bell within reach. Pt is not recommended for any further acute OT services and will be discharged to care of nursing staff for remaining length of stay.               Functional Status Assessment   Patient has not had a recent decline in their functional status     Equipment Recommendations   None recommended by OT             Precautions/Restrictions   Precautions Precautions: Fall Recall of Precautions/Restrictions: Intact Restrictions Weight Bearing Restrictions Per Provider Order: No     Mobility Bed Mobility Overal bed mobility: Independent                  Transfers Overall transfer level: Modified independent                 General transfer comment: No physical assist for  sit to stand followed by ambulation. Pt limps at baseline from previous stroke deficits.      Balance Overall balance assessment: Needs assistance Sitting-balance support: No upper extremity supported, Feet supported Sitting balance-Leahy Scale: Good Sitting balance - Comments: seated at EOB   Standing balance support: No upper extremity supported, During functional activity Standing balance-Leahy Scale: Fair Standing balance comment: fair without AD                           ADL either performed or assessed with clinical judgement   ADL Overall ADL's : Modified independent                                       General ADL Comments: Pt demonstrates ability to don socks and shoes, groom, and functional transfer without physical assist.     Vision Baseline Vision/History: 0 No visual deficits Ability to See in Adequate Light: 0 Adequate Patient Visual Report: No change from baseline Vision Assessment?: No apparent visual deficits     Perception Perception: Not tested       Praxis Praxis: Not tested       Pertinent Vitals/Pain Pain Assessment Pain Assessment: No/denies pain     Extremity/Trunk Assessment Upper Extremity Assessment Upper Extremity Assessment: LUE deficits/detail LUE  Deficits / Details: Baseline limited functional use from previous stroke. Pt reports no new deficits. Limited to no functional use.   Lower Extremity Assessment Lower Extremity Assessment: Defer to PT evaluation   Cervical / Trunk Assessment Cervical / Trunk Assessment: Kyphotic   Communication Communication Communication: No apparent difficulties   Cognition Arousal: Alert Behavior During Therapy: WFL for tasks assessed/performed Cognition: No apparent impairments                               Following commands: Intact       Cueing  General Comments   Cueing Techniques: Verbal cues                 Home Living Family/patient  expects to be discharged to:: Private residence Living Arrangements: Parent Available Help at Discharge: Family;Available PRN/intermittently Type of Home: House Home Access: Stairs to enter [employer] of Steps: 4 Entrance Stairs-Rails: Right;Left;Can reach both Home Layout: One level     Bathroom Shower/Tub: Chief Strategy Officer: Standard Bathroom Accessibility: No   Home Equipment: Agricultural Consultant (2 wheels);Cane - single point          Prior Functioning/Environment Prior Level of Function : Independent/Modified Independent             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 Independent ambulation in the house. ADLs Comments: Drives; cares for mother; independent.                            Co-evaluation PT/OT/SLP Co-Evaluation/Treatment: Yes Reason for Co-Treatment: To address functional/ADL transfers   OT goals addressed during session: ADL's and self-care                       End of Session    Activity Tolerance: Patient tolerated treatment well Patient left: in chair;with call bell/phone within reach;with chair alarm set  OT Visit Diagnosis: Unsteadiness on feet (R26.81);Other abnormalities of gait and mobility (R26.89)                Time: 9161-9143 OT Time Calculation (min): 18 min Charges:  OT General Charges $OT Visit: 1 Visit OT Evaluation $OT Eval Low Complexity: 1 Low  Scott Ritter OT, MOT  Scott Ritter 11/02/2024, 10:16 AM

## 2024-11-02 NOTE — Plan of Care (Signed)
   Problem: Education: Goal: Knowledge of General Education information will improve Description: Including pain rating scale, medication(s)/side effects and non-pharmacologic comfort measures Outcome: Progressing   Problem: Activity: Goal: Risk for activity intolerance will decrease Outcome: Progressing   Problem: Nutrition: Goal: Adequate nutrition will be maintained Outcome: Progressing

## 2024-11-02 NOTE — Discharge Instructions (Addendum)
 1)Watch for bleeding while on Blood Thinners--watch for blood in your stool which can make your stool black, maroon, mahogany or red---, blood in your urine which can make your urine pink or red, nosebleeds , also watch for possible bruising -You are taking Apixaban /Eliquis --- which is a blood thinner--- be careful to avoid injury or falls  2)Very Low-salt diet advised---Less than 2 gm of Sodium per day advised----ok to use Mrs DASH salt substitute instead of Salt  3)Weigh yourself daily, call if you gain more than 3 pounds in 1 day or more than 5 pounds in 1 week as your diuretic medications may need to be adjusted  4)Avoid ibuprofen /Advil /Aleve/Motrin Josefine Powders/Naproxen/BC powders/Meloxicam/Diclofenac/Indomethacin and other Nonsteroidal anti-inflammatory medications as these will make you more likely to bleed and can cause stomach ulcers, can also cause Kidney problems.   5)Repeat CBC and BMP blood tests in about 5 to 7 days  Information on my medicine - ELIQUIS  (apixaban )   Why was Eliquis  prescribed for you? Eliquis  was prescribed for you to reduce the risk of a blood clot forming that can cause a stroke if you have a medical condition called atrial fibrillation (a type of irregular heartbeat).  What do You need to know about Eliquis  ? Take your Eliquis  TWICE DAILY - one tablet in the morning and one tablet in the evening with or without food. If you have difficulty swallowing the tablet whole please discuss with your pharmacist how to take the medication safely.  Take Eliquis  exactly as prescribed by your doctor and DO NOT stop taking Eliquis  without talking to the doctor who prescribed the medication.  Stopping may increase your risk of developing a stroke.  Refill your prescription before you run out.  After discharge, you should have regular check-up appointments with your healthcare provider that is prescribing your Eliquis .  In the future your dose may need to be changed  if your kidney function or weight changes by a significant amount or as you get older.  What do you do if you miss a dose? If you miss a dose, take it as soon as you remember on the same day and resume taking twice daily.  Do not take more than one dose of ELIQUIS  at the same time to make up a missed dose. 1)Watch for bleeding while on Blood Thinners--watch for blood in your stool which can make your stool black, maroon, mahogany or red---, blood in your urine which can make your urine pink or red, nosebleeds , also watch for possible bruising -You are taking Apixaban /Eliquis --- which is a blood thinner--- be careful to avoid injury or falls  2)Very Low-salt diet advised---Less than 2 gm of Sodium per day advised----ok to use Mrs DASH salt substitute instead of Salt 3)Weigh yourself daily, call if you gain more than 3 pounds in 1 day or more than 5 pounds in 1 week as your diuretic medications may need to be adjusted  4)Avoid ibuprofen /Advil /Aleve/Motrin Josefine Powders/Naproxen/BC powders/Meloxicam/Diclofenac/Indomethacin and other Nonsteroidal anti-inflammatory medications as these will make you more likely to bleed and can cause stomach ulcers, can also cause Kidney problems.   5)Repeat CBC and BMP blood tests in about 5 to 7 days   Important Safety Information A possible side effect of Eliquis  is bleeding. You should call your healthcare provider right away if you experience any of the following: Bleeding from an injury or your nose that does not stop. Unusual colored urine (red or dark brown) or unusual colored stools (red or black). Unusual bruising  for unknown reasons. A serious fall or if you hit your head (even if there is no bleeding).  Some medicines may interact with Eliquis  and might increase your risk of bleeding or clotting while on Eliquis . To help avoid this, consult your healthcare provider or pharmacist prior to using any new prescription or non-prescription medications,  including herbals, vitamins, non-steroidal anti-inflammatory drugs (NSAIDs) and supplements.  This website has more information on Eliquis  (apixaban ): [email or web address] .[email or web address] dena

## 2024-11-02 NOTE — Consult Note (Signed)
 PHARMACY - ANTICOAGULATION CONSULT NOTE  Pharmacy Consult for Heparin  Indication: atrial fibrillation  Allergies[1]  Patient Measurements: Height: 5' 7 (170.2 cm) Weight: 83.3 kg (183 lb 11.2 oz) IBW/kg (Calculated) : 66.1 HEPARIN  DW (KG): 84.2  Vital Signs: Temp: 97.9 F (36.6 C) (01/21 1949) Temp Source: Oral (01/21 1949) BP: 155/97 (01/21 1949) Pulse Rate: 61 (01/21 1949)  Labs: Recent Labs    11/01/24 1803 11/02/24 0408 11/02/24 1050 11/02/24 1926  HGB 13.2 12.3*  --   --   HCT 40.6 37.7*  --   --   PLT 341 311  --   --   APTT 27  --   --   --   LABPROT 13.9  --   --   --   INR 1.0  --   --   --   HEPARINUNFRC  --  0.64 0.80* 0.60  CREATININE 1.97* 1.96*  --   --     Estimated Creatinine Clearance: 40.9 mL/min (A) (by C-G formula based on SCr of 1.96 mg/dL (H)).   Medical History: Past Medical History:  Diagnosis Date   GERD (gastroesophageal reflux disease)    Hypertension    Stroke (HCC)    left side deficits    Medications:  No history of chronic anticoagulant use PTA  Assessment: 64 year old male who presents emergency department chief complaint of right abdominal pain and flank pain for approximate the past 3 days. Cardiac monitor showing atrial fibrillation. Pharmacy has been consulted to initiate and dose continuous heparin  infusion.  HL 0.60- therapeutic x1 CBC WNL   Goal of Therapy:  Heparin  level 0.3-0.7 units/ml Monitor platelets by anticoagulation protocol: Yes   Plan:  Continue heparin  infusion to 1100 units/hr Check confirmatory anti-Xa level in 6 hours and daily while on heparin  Continue to monitor H&H and platelets  Aziza Stuckert A Leiby Pigeon, PharmD Clinical Pharmacist 11/02/2024 9:23 PM          [1] No Known Allergies

## 2024-11-02 NOTE — Plan of Care (Signed)

## 2024-11-02 NOTE — Evaluation (Signed)
 Physical Therapy Evaluation Patient Details Name: Scott Ritter MRN: [id number] DOB: Oct 17, 1960 Today's Date: 11/02/2024  History of Present Illness  Scott Ritter is a 64 y.o. 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  Also complains of intermittent sharp right flank pain lasting about 20 seconds then resolving spontaneously.  Endorses frequent urination.  No chest pain at home, however had an episode of nonradiating left-sided sharp chest pain while in the ER that lasted 10 to 15 seconds then resolved spontaneously.  The patient had no report of subjective fevers and chills.  He presented to the ER for further evaluation.   Clinical Impression  Patient was agreeable to PT/OT co-evaluation. Patient reports at baseline, he ambulates in home without AD but uses SPC in community for prolonged distances and is independent with ADL/iADL. Patient does have a history of CVA ~18 years ago, in which he has residual L hemiparesis at baseline. This date, patient remains at least modified independent with all bed mobility, transfers and ambulation without AD. Pt is able to donn socks and shoes independently and perform toileting and grooming tasks in standing modified independently, only requiring assist to open items. Patient tolerates sitting in chair at end of session, alarm set, all needs met, and call button in reach. Patient is at baseline and does not present with urgent need for skilled physical therapy at this time. Patient discharged to care of nursing for ambulation daily as tolerated for length of stay.        If plan is discharge home, recommend the following:     Can travel by private vehicle        Equipment Recommendations None recommended by PT  Recommendations for Other Services       Functional Status  Assessment Patient has had a recent decline in their functional status and demonstrates the ability to make significant improvements in function in a reasonable and predictable amount of time.     Precautions / Restrictions Precautions Precautions: Fall Recall of Precautions/Restrictions: Intact Restrictions Weight Bearing Restrictions Per Provider Order: No      Mobility  Bed Mobility Overal bed mobility: Independent       General bed mobility comments: HOB flat, no use of rails, no physical assist needed    Transfers Overall transfer level: Modified independent Equipment used: None               General transfer comment: STS from bed and chair, no AD, mild labored effort    Ambulation/Gait Ambulation/Gait assistance: Modified independent (Device/Increase time) Gait Distance (Feet): 80 Feet Assistive device: None Gait Pattern/deviations: Decreased step length - left, Decreased stance time - left, Decreased weight shift to left Gait velocity: Dec     General Gait Details: pt demo mild labored effort while ambulating in halls with the above secondary to CVA years ago, pt reports feeling at his baseline  Careers Information Officer     Tilt Bed    Modified Rankin (Stroke Patients Only)       Balance Overall balance assessment: Needs assistance Sitting-balance support: No upper extremity supported, Feet supported Sitting balance-Leahy Scale: Good Sitting balance - Comments: seated at EOB   Standing balance support: No upper extremity supported, During functional activity Standing balance-Leahy Scale: Fair Standing balance comment:  fair without AD               Pertinent Vitals/Pain Pain Assessment Pain Assessment: No/denies pain    Home Living Family/patient expects to be discharged to:: Private residence Living Arrangements: Parent Available Help at Discharge: Family;Available PRN/intermittently Type of Home: House Home  Access: Stairs to enter Entrance Stairs-Rails: Right;Left;Can reach both Entrance Stairs-Number of Steps: 4   Home Layout: One level Home Equipment: Agricultural Consultant (2 wheels);Cane - single point Additional Comments: Pt reports he continues to care for mother but she is currently at [facility]. Brother assists prn.    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 Independent ambulation in the house. ADLs Comments: pt reports independent, Drives; cares for mother     Extremity/Trunk Assessment   Upper Extremity Assessment Upper Extremity Assessment: Defer to OT evaluation LUE Deficits / Details: Baseline limited functional use from previous stroke. Pt reports no new deficits. Limited to no functional use.    Lower Extremity Assessment Lower Extremity Assessment: LLE deficits/detail;Generalized weakness LLE Deficits / Details: limited active ankle DF and knee ext with LLE secondary to CVA 18 years ago 3/5 at best, hip flexion MMT 4-/5, this is pt baseline LLE Coordination: decreased gross motor    Cervical / Trunk Assessment Cervical / Trunk Assessment: Kyphotic  Communication   Communication Communication: No apparent difficulties    Cognition Arousal: Alert Behavior During Therapy: WFL for tasks assessed/performed           Following commands: Intact       Cueing Cueing Techniques: Verbal cues, Visual cues     General Comments      Exercises     Assessment/Plan    PT Assessment Patient does not need any further PT services  PT Problem List         PT Treatment Interventions      PT Goals (Current goals can be found in the Care Plan section)  Acute Rehab PT Goals Patient Stated Goal: Return home PT Goal Formulation: With patient Time For Goal Achievement: 11/04/24 Potential to Achieve Goals: Good    Frequency       Co-evaluation PT/OT/SLP  Co-Evaluation/Treatment: Yes Reason for Co-Treatment: To address functional/ADL transfers PT goals addressed during session: Mobility/safety with mobility OT goals addressed during session: ADL's and self-care       AM-PAC PT 6 Clicks Mobility  Outcome Measure Help needed turning from your back to your side while in a flat bed without using bedrails?: None Help needed moving from lying on your back to sitting on the side of a flat bed without using bedrails?: None Help needed moving to and from a bed to a chair (including a wheelchair)?: None Help needed standing up from a chair using your arms (e.g., wheelchair or bedside chair)?: None Help needed to walk in hospital room?: None Help needed climbing 3-5 steps with a railing? : None 6 Click Score: 24    End of Session   Activity Tolerance: Patient tolerated treatment well Patient left: in chair;with call bell/phone within reach;with chair alarm set Nurse Communication: Mobility status PT Visit Diagnosis: Other abnormalities of gait and mobility (R26.89)    Time: 9160-9143 PT Time Calculation (min) (ACUTE ONLY): 17 min   Charges:   PT Evaluation $PT Eval Low Complexity: 1 Low   PT General Charges $$ ACUTE PT VISIT:  1 Visit        12:13 PM, 11/02/24 Rosaria Settler, PT, DPT [place] with [facility]

## 2024-11-02 NOTE — Progress Notes (Signed)
*  PRELIMINARY RESULTS* Echocardiogram 2D Echocardiogram has been performed.  Scott Ritter 11/02/2024, 3:34 PM

## 2024-11-03 ENCOUNTER — Other Ambulatory Visit (HOSPITAL_COMMUNITY): Payer: Self-pay

## 2024-11-03 ENCOUNTER — Telehealth (HOSPITAL_COMMUNITY): Payer: Self-pay | Admitting: Pharmacy Technician

## 2024-11-03 DIAGNOSIS — R54 Age-related physical debility: Secondary | ICD-10-CM | POA: Diagnosis present

## 2024-11-03 DIAGNOSIS — I5023 Acute on chronic systolic (congestive) heart failure: Secondary | ICD-10-CM | POA: Diagnosis present

## 2024-11-03 DIAGNOSIS — N179 Acute kidney failure, unspecified: Secondary | ICD-10-CM

## 2024-11-03 DIAGNOSIS — I472 Ventricular tachycardia, unspecified: Secondary | ICD-10-CM | POA: Diagnosis present

## 2024-11-03 DIAGNOSIS — E876 Hypokalemia: Secondary | ICD-10-CM | POA: Diagnosis present

## 2024-11-03 DIAGNOSIS — Z8673 Personal history of transient ischemic attack (TIA), and cerebral infarction without residual deficits: Secondary | ICD-10-CM | POA: Diagnosis not present

## 2024-11-03 DIAGNOSIS — I5021 Acute systolic (congestive) heart failure: Secondary | ICD-10-CM | POA: Diagnosis not present

## 2024-11-03 DIAGNOSIS — I4819 Other persistent atrial fibrillation: Secondary | ICD-10-CM | POA: Diagnosis not present

## 2024-11-03 DIAGNOSIS — E669 Obesity, unspecified: Secondary | ICD-10-CM | POA: Diagnosis present

## 2024-11-03 DIAGNOSIS — E785 Hyperlipidemia, unspecified: Secondary | ICD-10-CM | POA: Diagnosis present

## 2024-11-03 DIAGNOSIS — I480 Paroxysmal atrial fibrillation: Secondary | ICD-10-CM | POA: Diagnosis present

## 2024-11-03 DIAGNOSIS — R350 Frequency of micturition: Secondary | ICD-10-CM | POA: Diagnosis present

## 2024-11-03 DIAGNOSIS — Z8711 Personal history of peptic ulcer disease: Secondary | ICD-10-CM | POA: Diagnosis not present

## 2024-11-03 DIAGNOSIS — E860 Dehydration: Secondary | ICD-10-CM | POA: Diagnosis present

## 2024-11-03 DIAGNOSIS — Z6830 Body mass index (BMI) 30.0-30.9, adult: Secondary | ICD-10-CM | POA: Diagnosis not present

## 2024-11-03 DIAGNOSIS — I4891 Unspecified atrial fibrillation: Secondary | ICD-10-CM | POA: Diagnosis present

## 2024-11-03 DIAGNOSIS — K219 Gastro-esophageal reflux disease without esophagitis: Secondary | ICD-10-CM | POA: Diagnosis present

## 2024-11-03 DIAGNOSIS — I272 Pulmonary hypertension, unspecified: Secondary | ICD-10-CM | POA: Diagnosis present

## 2024-11-03 DIAGNOSIS — I639 Cerebral infarction, unspecified: Secondary | ICD-10-CM | POA: Diagnosis not present

## 2024-11-03 DIAGNOSIS — Z8249 Family history of ischemic heart disease and other diseases of the circulatory system: Secondary | ICD-10-CM | POA: Diagnosis not present

## 2024-11-03 DIAGNOSIS — Z8719 Personal history of other diseases of the digestive system: Secondary | ICD-10-CM | POA: Diagnosis not present

## 2024-11-03 DIAGNOSIS — Z7982 Long term (current) use of aspirin: Secondary | ICD-10-CM | POA: Diagnosis not present

## 2024-11-03 DIAGNOSIS — Z79899 Other long term (current) drug therapy: Secondary | ICD-10-CM | POA: Diagnosis not present

## 2024-11-03 DIAGNOSIS — D649 Anemia, unspecified: Secondary | ICD-10-CM | POA: Diagnosis present

## 2024-11-03 DIAGNOSIS — I2489 Other forms of acute ischemic heart disease: Secondary | ICD-10-CM | POA: Diagnosis present

## 2024-11-03 DIAGNOSIS — I110 Hypertensive heart disease with heart failure: Secondary | ICD-10-CM | POA: Diagnosis present

## 2024-11-03 DIAGNOSIS — I43 Cardiomyopathy in diseases classified elsewhere: Secondary | ICD-10-CM | POA: Diagnosis present

## 2024-11-03 LAB — BASIC METABOLIC PANEL WITH GFR
Anion gap: 11 (ref 5–15)
BUN: 34 mg/dL — ABNORMAL HIGH (ref 8–23)
CO2: 25 mmol/L (ref 22–32)
Calcium: 8.5 mg/dL — ABNORMAL LOW (ref 8.9–10.3)
Chloride: 103 mmol/L (ref 98–111)
Creatinine, Ser: 2.19 mg/dL — ABNORMAL HIGH (ref 0.61–1.24)
GFR, Estimated: 33 mL/min — ABNORMAL LOW
Glucose, Bld: 97 mg/dL (ref 70–99)
Potassium: 3.4 mmol/L — ABNORMAL LOW (ref 3.5–5.1)
Sodium: 139 mmol/L (ref 135–145)

## 2024-11-03 LAB — CBC
HCT: 35.6 % — ABNORMAL LOW (ref 39.0–52.0)
Hemoglobin: 11.4 g/dL — ABNORMAL LOW (ref 13.0–17.0)
MCH: 27.9 pg (ref 26.0–34.0)
MCHC: 32 g/dL (ref 30.0–36.0)
MCV: 87.3 fL (ref 80.0–100.0)
Platelets: 278 K/uL (ref 150–400)
RBC: 4.08 MIL/uL — ABNORMAL LOW (ref 4.22–5.81)
RDW: 15.8 % — ABNORMAL HIGH (ref 11.5–15.5)
WBC: 9.7 K/uL (ref 4.0–10.5)
nRBC: 0 % (ref 0.0–0.2)

## 2024-11-03 LAB — HEPARIN LEVEL (UNFRACTIONATED): Heparin Unfractionated: 0.39 [IU]/mL (ref 0.30–0.70)

## 2024-11-03 LAB — TSH: TSH: 1.99 u[IU]/mL (ref 0.350–4.500)

## 2024-11-03 LAB — PRO BRAIN NATRIURETIC PEPTIDE: Pro Brain Natriuretic Peptide: 10395 pg/mL — ABNORMAL HIGH

## 2024-11-03 MED ORDER — POTASSIUM CHLORIDE CRYS ER 20 MEQ PO TBCR
40.0000 meq | EXTENDED_RELEASE_TABLET | ORAL | Status: AC
  Administered 2024-11-03 (×2): 40 meq via ORAL
  Filled 2024-11-03 (×2): qty 2

## 2024-11-03 MED ORDER — HYDRALAZINE HCL 25 MG PO TABS
25.0000 mg | ORAL_TABLET | Freq: Once | ORAL | Status: AC
  Administered 2024-11-03: 25 mg via ORAL
  Filled 2024-11-03: qty 1

## 2024-11-03 MED ORDER — FUROSEMIDE 10 MG/ML IJ SOLN
60.0000 mg | Freq: Once | INTRAMUSCULAR | Status: AC
  Administered 2024-11-03: 60 mg via INTRAVENOUS
  Filled 2024-11-03: qty 6

## 2024-11-03 MED ORDER — HYDRALAZINE HCL 50 MG PO TABS
50.0000 mg | ORAL_TABLET | Freq: Three times a day (TID) | ORAL | Status: DC
  Administered 2024-11-03 – 2024-11-04 (×3): 50 mg via ORAL
  Filled 2024-11-03 (×3): qty 1

## 2024-11-03 NOTE — Progress Notes (Signed)
 Mobility Specialist Progress Note:    11/03/24 1500  Mobility  Activity Ambulated with assistance  Level of Assistance Contact guard assist, steadying assist  Assistive Device Cane  Distance Ambulated (ft) 50 ft  Range of Motion/Exercises Active;All extremities  Activity Response Tolerated well  Mobility Referral Yes  Mobility visit 1 Mobility  Mobility Specialist Start Time (ACUTE ONLY) 1500  Mobility Specialist Stop Time (ACUTE ONLY) 1520  Mobility Specialist Time Calculation (min) (ACUTE ONLY) 20 min   Pt received in chair, agreeable to mobility. Required CGA to stand and ambulate with straight cane. Tolerated well, asx throughout. Returned to chair, all needs met.  Ceclia Koker Mobility Specialist Please contact via Special Educational Needs Teacher or  Rehab office at [phone]

## 2024-11-03 NOTE — Care Management Obs Status (Signed)
 MEDICARE OBSERVATION STATUS NOTIFICATION   Patient Details  Name: Scott Ritter MRN: [id number] Date of Birth: 1960/10/24   Medicare Observation Status Notification Given:  Yes    Hoy DELENA Bigness, LCSW 11/03/2024, 10:14 AM

## 2024-11-03 NOTE — Consult Note (Addendum)
 "  Cardiology Consultation   Patient ID: Scott Ritter MRN: [id number]; DOB: [birth year]  Admit date: 11/01/2024 Date of Consult: 11/03/2024  PCP:  Edman Meade PEDLAR, FNP   [facility] Providers Cardiologist: Previously Dr. Alvan in 2020  Patient Profile:  Scott Ritter is a 64 y.o. male with a hx of HTN, HLD, prior CVA and obesity who is being seen 11/03/2024 for the evaluation of atrial fibrillation and HFrEF at the request of Dr. Pearlean.  History of Present Illness:  Scott Ritter presented to [facility] on 11/01/2024 for evaluation of flank pain for the past 3 days.  Initial labs showed WBC 10.8, Hgb 13.2, platelets 341, Na+ 140, K+ 3.3 and creatinine 1.97 (previously 0.97 in 05/2024). Troponin T values elevated at 132 and 131. TSH normal at 1.990. CTA showed no evidence of a PE. Was noted to have a trace right pleural effusion and aortic atherosclerosis. CT abdomen with no acute abdominopelvic abnormalities. EKG showed rate-controlled atrial fibrillation, heart rate 80 with PVC's with LVH and TWI along lateral leads which has been noted on prior tracings.   He was admitted for further management of newly diagnosed atrial fibrillation and his AKI. Echo yesterday showed a reduced EF of 20-25% with global HK (EF previously 60-65% in 10/2021). RV function mildly reduced with moderately elevated PASP at 56.5 mmHg. Noted to have severe LA dilation, mild RA dilation and mild MR. proBNP checked today and elevated at 10,395.  He was started on IV Heparin  at the time of admission and given his new cardiomyopathy yesterday, he was started on Coreg  3.125 mg twice daily, Hydralazine  25 mg 3 times daily and Imdur  30 mg daily. Has not received diuretics.   In talking the patient today, he reports having palpitations over the past few months but reports that they would spontaneously resolve. Over the past week, he has noticed more fatigue and shortness of breath with walking short  distances around his home. He was previously able to go to the grocery store or [employer] without any shortness of breath or fatigue. He denies any recent chest pain. No specific orthopnea, PND or pitting edema. Is unaware of any personal history of cardiac issues and no known family history of cardiac issues. Reports occasional, social alcohol intake but no daily drinking.   Past Medical History:  Diagnosis Date   GERD (gastroesophageal reflux disease)    Hypertension    Stroke [facility])    left side deficits    Past Surgical History:  Procedure Laterality Date   BACK SURGERY     BIOPSY  09/17/2018   Procedure: BIOPSY;  Surgeon: Harvey Margo CROME, MD;  Location[facility];  Service: Endoscopy;;  duodenal biopsy    COLONOSCOPY N/A 02/29/2016   Dr. Harvey: redundant colon, non-bleeding internal hemorrhoids    COLONOSCOPY WITH PROPOFOL  N/A 04/09/2021   Procedure: COLONOSCOPY WITH PROPOFOL ;  Surgeon: Cindie Carlin POUR, DO;  Location[facility];  Service: Endoscopy;  Laterality: N/A;  7:30am   ESOPHAGOGASTRODUODENOSCOPY N/A 12/17/2016   Dr. harvey: 3 mm nonbleeding Mallory-Weiss tear.  EGD performed for hematemesis.  Hemoglobin normal.   ESOPHAGOGASTRODUODENOSCOPY N/A 02/19/2018   Dr. Shaaron: Performed for melena, hemoglobin of 6.  Mucosal changes in the esophagus query short segment Barrett's, biopsy more consistent with reflux changes.  Erosive gastropathy but no H. pylori.  Duodenal erosions.  Suspected NSAID induced injury.   ESOPHAGOGASTRODUODENOSCOPY  08/18/2018   Dr. Golda: IDA/heme positive stool.  Esophageal mucosal changes  consistent with short segment Barrett's esophagus, not biopsied.  2 cm hiatal hernia.  Duodenal bulb, second portion of duodenum, third portion of the duodenum.  Video capsule somewhat difficult to pass through the oropharynx but was eventually advanced into the second portion of the duodenum and released.   ESOPHAGOGASTRODUODENOSCOPY N/A 08/18/2018   Procedure:  ESOPHAGOGASTRODUODENOSCOPY (EGD);  Surgeon: Golda Claudis PENNER, MD;  [facility];  Service: Endoscopy;  Laterality: N/A;   ESOPHAGOGASTRODUODENOSCOPY N/A 09/17/2018   gastritis, 2 nonbleeding angiectasia's in the duodenum were treated with [facility] coagulation status post biopsy.   FOOT SURGERY Left    GIVENS CAPSULE STUDY N/A 08/18/2018   Procedure: GIVENS CAPSULE STUDY;  Surgeon: Golda Claudis PENNER, MD;  Location[facility];  Service: Endoscopy;  Laterality: N/A;   HERNIA REPAIR     POLYPECTOMY  04/09/2021   Procedure: POLYPECTOMY;  Surgeon: Cindie Carlin POUR, DO;  [facility];  Service: Endoscopy;;  descending; cecal     Home Medications:  Prior to Admission medications  Medication Sig Start Date End Date Taking? Authorizing Provider  acetaminophen  (TYLENOL ) 500 MG tablet Take 500 mg by mouth every 6 (six) hours as needed for moderate pain (pain score 4-6).   Yes [provider]  atorvastatin  (LIPITOR) 80 MG tablet TAKE 1 TABLET (80 MG TOTAL) BY MOUTH DAILY. 03/10/24  Yes Bacchus, Meade PEDLAR, FNP  Cholecalciferol  (VITAMIN D ) 2000 units CAPS Take 2,000 Units by mouth daily.   Yes [provider]  ezetimibe  (ZETIA ) 10 MG tablet Take 1 tablet (10 mg total) by mouth daily. 05/20/24  Yes Bacchus, Meade PEDLAR, FNP  gabapentin  (NEURONTIN ) 300 MG capsule Take 1 capsule (300 mg total) by mouth 2 (two) times daily. 03/18/24  Yes Bacchus, Meade PEDLAR, FNP  hydrALAZINE  (APRESOLINE ) 100 MG tablet TAKE 1 TABLET THREE TIMES DAILY 03/10/24  Yes Bacchus, Gloria Z, FNP  losartan -hydrochlorothiazide (HYZAAR) 100-25 MG tablet TAKE 1 TABLET EVERY DAY 01/28/24  Yes Bacchus, Gloria Z, FNP  metoprolol  tartrate (LOPRESSOR ) 25 MG tablet TAKE 1 TABLET TWICE DAILY 08/19/22  Yes Bacchus, Gloria Z, FNP  potassium chloride  SA (KLOR-CON  M) 20 MEQ tablet TAKE 1 TABLET TWICE DAILY 09/26/24  Yes Bacchus, Gloria Z, FNP    Scheduled Meds:  aspirin  EC  81 mg Oral Daily   atorvastatin   80 mg Oral Daily    carvedilol   3.125 mg Oral BID WC   ezetimibe   10 mg Oral Daily   hydrALAZINE   25 mg Oral TID   isosorbide  mononitrate  30 mg Oral Daily   potassium chloride   40 mEq Oral Q3H   Continuous Infusions:  heparin  1,100 Units/hr (11/03/24 0427)   PRN Meds: acetaminophen , melatonin, polyethylene glycol, prochlorperazine   Allergies:   Allergies[1]  Social History:   Social History   Socioeconomic History   Marital status: Divorced    Spouse name: Not on file   Number of children: 2   Years of education: Not on file   Highest education level: Not on file  Occupational History   Not on file  Tobacco Use   Smoking status: Never    Passive exposure: Never   Smokeless tobacco: Never  Vaping Use   Vaping status: Never Used  Substance and Sexual Activity   Alcohol use: Yes    Alcohol/week: 1.0 standard drink of alcohol    Types: 1 Cans of beer per week    Comment: occasionally; once a week or once a month   Drug use: No  Sexual activity: Yes    Comment: not married hs sexual partner  Other Topics Concern   Not on file  Social History Narrative   PT lives with his mother, he is on disability for his stroke.     Family History:    Family History  Problem Relation Age of Onset   Hypertension Mother    Hypertension Father    Colon cancer Father    Colon polyps Neg Hx      ROS:  Please see the history of present illness.   All other ROS reviewed and negative.     Physical Exam/Data: Vitals:   11/03/24 0800 11/03/24 0850 11/03/24 0914 11/03/24 0921  BP: 125/69 (!) 161/98 (!) 150/90 (!) 150/90  Pulse: (!) 107 92  92  Resp: 16     Temp: 98.3 F (36.8 C) 97.6 F (36.4 C)    TempSrc: Oral Oral    SpO2: 95% 97%    Weight:      Height:        Intake/Output Summary (Last 24 hours) at 11/03/2024 0950 Last data filed at 11/03/2024 0800 Gross per 24 hour  Intake 942.17 ml  Output 250 ml  Net 692.17 ml      11/03/2024    4:13 AM 11/02/2024    5:28 AM 11/01/2024    10:38 PM  Last 3 Weights  Weight (lbs) 184 lb 8.4 oz 183 lb 11.2 oz 185 lb 14.4 oz  Weight (kg) 83.7 kg 83.326 kg 84.324 kg     Body mass index is 28.9 kg/m.  General:  Pleasant male appearing in no acute distress HEENT: normal Neck: no JVD Vascular: No carotid bruits; Distal pulses 2+ bilaterally Cardiac:  normal S1, S2; Irregularly irregular Lungs:  clear to auscultation bilaterally, no wheezing, rhonchi or rales  Abd: soft, nontender, no hepatomegaly  Ext: 1+ pitting edema along LLE, trace edema along RLE.  Musculoskeletal:  No deformities, BUE and BLE strength normal and equal Skin: warm and dry  Neuro:  CNs 2-12 intact, no focal abnormalities noted Psych:  Normal affect   EKG:  The EKG was personally reviewed and demonstrates: Rate-controlled atrial fibrillation, heart rate 80 with PVC's with LVH and TWI along lateral leads which has been noted on prior tracings.  Telemetry:  Telemetry was personally reviewed and demonstrates: Rate-controlled atrial fibrillation, heart rate in the 80's with PVC's and occasional couplets.  Relevant CV Studies:  Echocardiogram: 11/02/2024 IMPRESSIONS     1. Left ventricular ejection fraction, by estimation, is 20 to 25%. The  left ventricle has severely decreased function. The left ventricle  demonstrates global hypokinesis. There is mild left ventricular  hypertrophy. Left ventricular diastolic parameters   are indeterminate. The average left ventricular global longitudinal  strain is -4.8 %. The global longitudinal strain is abnormal.   2. Right ventricular systolic function is mildly reduced. The right  ventricular size is normal. There is moderately elevated pulmonary artery  systolic pressure.   3. Left atrial size was severely dilated.   4. Right atrial size was mildly dilated.   5. The mitral valve is abnormal. Mild mitral valve regurgitation. No  evidence of mitral stenosis.   6. The tricuspid valve is abnormal.   7. The aortic  valve is tricuspid. Aortic valve regurgitation is not  visualized. No aortic stenosis is present.   8. The inferior vena cava is dilated in size with <50% respiratory  variability, suggesting right atrial pressure of 15 mmHg.   Laboratory Data:  High Sensitivity Troponin:  No results for input(s): TROPONINIHS in the last 720 hours.  Recent Labs  Lab 11/01/24 1803 11/01/24 2024  TRNPT 132* 131*      Chemistry Recent Labs  Lab 11/01/24 1803 11/02/24 0408 11/03/24 0148  NA 140 140 139  K 3.3* 3.3* 3.4*  CL 102 104 103  CO2 22 21* 25  GLUCOSE 101* 94 97  BUN 32* 31* 34*  CREATININE 1.97* 1.96* 2.19*  CALCIUM  8.8* 8.9 8.5*  MG 1.7 1.9  --   GFRNONAA 38* 38* 33*  ANIONGAP 15 15 11     Recent Labs  Lab 11/01/24 1803  PROT 6.7  ALBUMIN 3.5  AST 32  ALT 25  ALKPHOS 105  BILITOT 1.1   Lipids No results for input(s): CHOL, TRIG, HDL, LABVLDL, LDLCALC, CHOLHDL in the last 168 hours.  Hematology Recent Labs  Lab 11/01/24 1803 11/02/24 0408 11/03/24 0148  WBC 10.8* 10.6* 9.7  RBC 4.73 4.37 4.08*  HGB 13.2 12.3* 11.4*  HCT 40.6 37.7* 35.6*  MCV 85.8 86.3 87.3  MCH 27.9 28.1 27.9  MCHC 32.5 32.6 32.0  RDW 15.4 15.7* 15.8*  PLT 341 311 278   Thyroid   Recent Labs  Lab 11/03/24 0148  TSH 1.990    BNP Recent Labs  Lab 11/03/24 0148  PROBNP 10,395.0*    DDimer No results for input(s): DDIMER in the last 168 hours.  Radiology/Studies:    CT ABDOMEN PELVIS W CONTRAST Result Date: 11/01/2024 EXAM: CT ABDOMEN AND PELVIS WITH CONTRAST 11/01/2024 07:58:39 PM TECHNIQUE: CT of the abdomen and pelvis was performed with the administration of intravenous contrast. Multiplanar reformatted images are provided for review. Automated exposure control, iterative reconstruction, and/or weight-based adjustment of the mA/kV was utilized to reduce the radiation dose to as low as reasonably achievable. COMPARISON: None available. CLINICAL HISTORY: FINDINGS: LOWER CHEST:  Trace right pleural effusion. LIVER: The liver is unremarkable. GALLBLADDER AND BILE DUCTS: Multiple small gallstones without associated inflammatory changes. No biliary ductal dilatation. SPLEEN: No acute abnormality. PANCREAS: No acute abnormality. ADRENAL GLANDS: No acute abnormality. KIDNEYS, URETERS AND BLADDER: Subcentimeter bilateral renal cysts, benign (Bosniak 1). No stones in the kidneys or ureters. No hydronephrosis. No perinephric or periureteral stranding. Urinary bladder is unremarkable. GI AND BOWEL: Normal appendix (image 48). Stomach demonstrates no acute abnormality. There is no bowel obstruction. PERITONEUM AND RETROPERITONEUM: No ascites. No free air. Small fat containing umbilical hernia. VASCULATURE: Moderate atherosclerotic calcification of the abdominal aorta without aneurysm. LYMPH NODES: No lymphadenopathy. REPRODUCTIVE ORGANS: Prostate is unremarkable. BONES AND SOFT TISSUES: No acute osseous abnormality. Tiny fat-containing bilateral inguinal hernias. IMPRESSION: 1. No acute abdominopelvic abnormality. 2. Cholelithiasis, without CT evidence of acute cholecystitis. 3. Aortic Atherosclerosis (ICD10-I70.0). Electronically signed by: Pinkie Pebbles MD 11/01/2024 08:07 PM EST RP Workstation: [id number]   CT Angio Chest PE W/Cm &/Or Wo Cm Result Date: 11/01/2024 EXAM: CTA of the Chest with contrast for PE 11/01/2024 07:58:39 PM TECHNIQUE: CTA of the chest was performed after the administration of intravenous contrast. Multiplanar reformatted images are provided for review. MIP images are provided for review. Automated exposure control, iterative reconstruction, and/or weight based adjustment of the mA/kV was utilized to reduce the radiation dose to as low as reasonably achievable. COMPARISON: None available. CLINICAL HISTORY: SOB, right sided pain, lethargy FINDINGS: PULMONARY ARTERIES: Pulmonary arteries are adequately opacified for evaluation. No pulmonary embolism. Main pulmonary artery  is normal in caliber. MEDIASTINUM: The heart and pericardium demonstrate no acute abnormality. Mild thoracic aortic atherosclerosis. There  is no acute abnormality of the thoracic aorta. LYMPH NODES: No mediastinal, hilar or axillary lymphadenopathy. LUNGS AND PLEURA: Mild subpleural scarring/atelectasis in the posterior right upper lobe and bilateral lower lobes. Trace right pleural effusion. No pneumothorax. UPPER ABDOMEN: Limited images of the upper abdomen are unremarkable. SOFT TISSUES AND BONES: No acute bone or soft tissue abnormality. IMPRESSION: 1. No pulmonary embolism. 2. Trace right pleural effusion. 3. Aortic Atherosclerosis (ICD10-I70.0). Electronically signed by: Pinkie Pebbles MD 11/01/2024 08:05 PM EST RP Workstation: [id number]     Assessment and Plan:  1. Acute on Chronic HFrEF - Presented with flank pain but was found to have a new cardiomyopathy with EF at 20 to 25% and mildly reduced RV function. He denies any recent chest pain. Suspect his cardiomyopathy is likely either due to his atrial fibrillation or uncontrolled hypertension. - Will review with Dr. Alvan but would anticipate initiation of medical therapy and planning for a follow-up echocardiogram in 2 to 3 months for reassessment of his EF. He is not an ideal candidate for ischemic evaluation at this time given his creatinine at 2.19. He has not yet received diuresis but does have some lower extremity edema on examination today and still having significant shortness of breath. Could dose IV Lasix  today and assess response and recheck renal function in the morning. - Continue Coreg  3.125 mg twice daily, Hydralazine  25 mg 3 times daily and Imdur  30 mg daily. Would plan to add an SGLT2i if renal function improves. No ACE-I/ARB/ARNI/MRA for now given his AKI.   2. Newly Diagnosed Atrial Fibrillation - New diagnosis for the patient this admission and his heart rate has overall been well-controlled in the 80's. Continue Coreg   3.125 mg twice daily for now. - He has been started on IV Heparin  for anticoagulation. Given his CHA2DS2-VASc score of 4, long-term anticoagulation is indicated.  Once it is determined no further procedures are indicated this admission, will switch to Eliquis . - No urgent indication for TEE/DCCV at this time. Could consider DCCV following 3 to 4 weeks of uninterrupted anticoagulation but suspect it would be challenging for him to maintain normal sinus rhythm given his severe left atrial dilation by echocardiogram.  3. Elevated Troponin Values - Troponin T values elevated at 132 and 131 this admission. He denies any recent chest pain. Suspect his enzyme trend is secondary to demand ischemia in the setting of his AKI and CHF. No plans for ischemic evaluation at this time given his renal function.   4. Accelerated HTN - BP has been significantly elevated, peaking at 175/133 within the past 24 hours. Reports this was variable when checked at home and he is listed as taking Hydralazine  100 mg 3 times daily, Losartan -HCTZ 100-25 mg daily and Lopressor  25 mg twice daily prior to admission.   - He has just been started on Coreg  3.125 mg twice daily, Hydralazine  25 mg 3 times daily and Imdur  30 mg daily. Pending BP trend throughout the day, would further titrate Coreg  and/or Hydralazine .  5. HLD - Goal LDL is less than 70 in the setting of prior CVA. He has been continued on PTA Atorvastatin  80mg  daily and Zetia  10mg  daily.   6. AKI - Creatinine was previously at 0.97 in 05/2024 and elevated at 1.97 on admission. At 2.19 today.  He initially received a 1 L fluid bolus but additional fluids have been held given his cardiomyopathy.   Risk Assessment/Risk Scores:  CHA2DS2-VASc Score = 4  This indicates a 4.8% annual risk of  stroke. The patient's score is based upon: CHF History: 1 HTN History: 1 Diabetes History: 0 Stroke History: 2 Vascular Disease History: 0 Age Score: 0 Gender Score: 0   For  questions or updates, please contact [facility] Please consult [email or web address] for contact info under   Signed, Laymon CHRISTELLA Qua, PA-C  11/03/2024 9:50 AM   Patient seen and discussed with PA Qua, I agree with her documentation. 64 yo male history of HTN, prior CVA, prior GI bleeding 2019 thought secondary to NSAIDs, presented with SOB x 1week. Denies any chest pain, no LE edema.    K 3.3 BUN 32 Cr 1.97 WBC 10.8 Hgb 13.2 Plt 341 Mg 1.7 proBNP 10,395 TSH 1.99  Trop 132-->131 EKG: afib, difficult to determine if lateral Qwaves of lead misplacement, lateral precordial TWIs CT PE: no PE, trace right pleural effusion Echo: LVEF 20-25%, mild LVH, indet diastolic, mild RV dysfunction, severe LAE, mild MR,     1.Acute HFrEF - Jan 2023 echo: LVEF 60-65% - Jan 2026 Echo: LVEF 20-25%, mild LVH, indet diastolic, mild RV dysfunction, severe LAE, mild MR,  - CT trace right pleural effusion, proBNP 10,395  - trial of IV lasix  60mg  x 1 today and follow response as far as symptoms and renal function.   - medical therapy limited by AKI. Avoid ACE/ARB/ARNI/MRA. Consider SGLT2i closer to d/c, GFR 33 ok for use.  - started on coreg  3.125mg  bid, hydralazine  25mg  tid, imdur  30mg  daily. Increase hydralazine  to 50mg  tid. Very low EF, cautious on titration of coreg .  - AKI would prohibit cath at this time - certaintly given his degree of HTN would consider HTN cardiomyopathy. - new diagnosis of afib, rates have not been significantly elevated here, I think tachy mediated CM less likely.    2.Afib - new diagnosis this admission - on hep gtt currently, transition to DOAC when clear no inpatient invasive procedures will be planned - on coreg  for rate control   3. AKI - Cr 1.97 on presentation, baseline 1-1.2 - could be a degree of venous congestion related to HF, attempt at diuresis could potentially improve renal function    4. HTN - severe HTN on presentation, prior admits similar -  medical therapy limited by AKI, will increase hydralazine  to 50mg  tid.   Dorn Ross MD    [1] No Known Allergies  "

## 2024-11-03 NOTE — Care Management CC44 (Signed)
"         Condition Code 44 Documentation Completed  Patient Details  Name: LAVELL SUPPLE MRN: [id number] Date of Birth: [birth year]   Condition Code 44 given:  Yes Patient signature on Condition Code 44 notice:  Yes Documentation of 2 MD's agreement:  Yes Code 44 added to claim:  Yes    Hoy DELENA Bigness, LCSW 11/03/2024, 10:14 AM  "

## 2024-11-03 NOTE — Consult Note (Signed)
 PHARMACY - ANTICOAGULATION CONSULT NOTE  Pharmacy Consult for Heparin  Indication: atrial fibrillation  Allergies[1]  Patient Measurements: Height: 5' 7 (170.2 cm) Weight: 83.3 kg (183 lb 11.2 oz) IBW/kg (Calculated) : 66.1 HEPARIN  DW (KG): 84.2  Vital Signs: Temp: 98.1 F (36.7 C) (01/22 0413) Temp Source: Oral (01/22 0413) BP: 160/110 (01/22 0413) Pulse Rate: 92 (01/22 0413)  Labs: Recent Labs    11/01/24 1803 11/01/24 1803 11/02/24 0408 11/02/24 1050 11/02/24 1926 11/03/24 0148  HGB 13.2  --  12.3*  --   --  11.4*  HCT 40.6  --  37.7*  --   --  35.6*  PLT 341  --  311  --   --  278  APTT 27  --   --   --   --   --   LABPROT 13.9  --   --   --   --   --   INR 1.0  --   --   --   --   --   HEPARINUNFRC  --    < > 0.64 0.80* 0.60 0.39  CREATININE 1.97*  --  1.96*  --   --  2.19*   < > = values in this interval not displayed.    Estimated Creatinine Clearance: 36.6 mL/min (A) (by C-G formula based on SCr of 2.19 mg/dL (H)).   Medical History: Past Medical History:  Diagnosis Date   GERD (gastroesophageal reflux disease)    Hypertension    Stroke (HCC)    left side deficits    Medications:  No history of chronic anticoagulant use PTA  Assessment: 64 year old male who presents emergency department chief complaint of right abdominal pain and flank pain for approximate the past 3 days. Cardiac monitor showing atrial fibrillation. Pharmacy has been consulted to initiate and dose continuous heparin  infusion.  Heparin  level therapeutic x 2  Goal of Therapy:  Heparin  level 0.3-0.7 units/ml Monitor platelets by anticoagulation protocol: Yes   Plan:  Cont heparin  1100 units/hr Daily CBC and heparin  level Monitor for bleeding  Lynwood Mckusick, PharmD, BCPS Clinical Pharmacist Phone: [phone]            [1] No Known Allergies

## 2024-11-03 NOTE — Telephone Encounter (Signed)
 Patient Product/process Development Scientist completed.    The patient is insured through [employer]. Patient has Medicare and is not eligible for a copay card, but may be able to apply for patient assistance or Medicare RX Payment Plan (Patient Must reach out to their plan, if eligible for payment plan), if available.    Ran test claim for sacubitril-valsartan 24-26 mg and the current 30 day co-pay is $75.30 due to a deductible.  Ran test claim for dapagliflozin 10 mg and the current 30 day co-pay is $181.89 due to a deductible.  Ran test claim for Jardiance 10 mg and the current 30 day co-pay is $204.12 due to a deductible.  This test claim was processed through [facility]- copay amounts may vary at other pharmacies due to pharmacy/plan contracts, or as the patient moves through the different stages of their insurance plan.     Reyes Sharps, CPHT Pharmacy Technician Patient Advocate Specialist [facility] Patient Advocate Team Direct Number: [phone]  Fax: [phone]

## 2024-11-03 NOTE — Progress Notes (Signed)
 Heart Failure Stewardship Pharmacy Note  PCP: Edman Meade PEDLAR, FNP PCP-Cardiologist: None  HPI: Scott Ritter is a 64 y.o. 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  Also complained of intermittent sharp right flank pain lasting about 20 seconds then resolving spontaneously. Endorsed frequent urination.  No chest pain at home, however had an episode of nonradiating left-sided sharp chest pain while in the ER that lasted 10 to 15 seconds then resolved spontaneously.   On admission, proBNP was 89604, HS-troponin was 132, K 3.3, Scr 1.97, BUN 32, Mg 1.7, Ph 3.2, LFTs normal, and normal TSH. Chest x-ray noted no pulmonary embolism, trace right pleural effusion, aortic atherosclerosis.   Pertinent cardiac history: - TTE 01/2019 noted LVEF 55-60%, mild LVH, diastolic parameters consistent with impaired relaxation - TTE 10/2021 noted LVEF 60-65%, mild LVH - TTE 11/02/24 noted LVEF 20-25%, global hypokinesis, mild LVH, mildly reduced RV systolic function, moderately elevated pulmonary artery systolic pressure, mild MVR  Pertinent Lab Values: Creat  Date Value Ref Range Status  04/03/2021 1.11 0.70 - 1.33 mg/dL Final    Comment:    For patients >50 years of age, the reference limit for Creatinine is approximately 13% higher for people identified as African-American. .    Creatinine, Ser  Date Value Ref Range Status  11/03/2024 2.19 (H) 0.61 - 1.24 mg/dL Final   BUN  Date Value Ref Range Status  11/03/2024 34 (H) 8 - 23 mg/dL Final  95/85/7974 13 8 - 27 mg/dL Final   Potassium  Date Value Ref Range Status  11/03/2024 3.4 (L) 3.5 - 5.1 mmol/L Final   Sodium  Date Value Ref Range Status  11/03/2024 139 135 - 145 mmol/L Final  01/25/2024 137 134 - 144 mmol/L Final   B Natriuretic Peptide  Date Value Ref Range  Status  05/25/2024 297.0 (H) 0.0 - 100.0 pg/mL Final    Comment:    Performed at [facility], [address] [place], [place] [address]   Magnesium  Date Value Ref Range Status  11/02/2024 1.9 1.7 - 2.4 mg/dL Final    Comment:    Performed at [facility], [address], [place], [place] [address]   Hgb A1c MFr Bld  Date Value Ref Range Status  01/25/2024 5.7 (H) 4.8 - 5.6 % Final    Comment:             Prediabetes: 5.7 - 6.4          Diabetes: >6.4          Glycemic control for adults with diabetes: <7.0    TSH  Date Value Ref Range Status  11/03/2024 1.990 0.350 - 4.500 uIU/mL Final    Comment:    Performed at [facility], [address], [place], [place] [address]  01/25/2024 0.992 0.450 - 4.500 uIU/mL Final    Vital Signs: Temp:  [97.9 F (36.6 C)-98.2 F (36.8 C)] 98.1 F (36.7 C) (01/22 0413) Pulse Rate:  [61-92] 92 (01/22 0413) Cardiac Rhythm: Atrial fibrillation (01/21 1900) Resp:  [17-18] 17 (01/22 0413) BP: (152-160)/(97-127) 160/110 (01/22 0413) SpO2:  [95 %-99 %] 95 % (01/22 0413) Weight:  [83.7 kg (184 lb 8.4 oz)] 83.7 kg (184 lb 8.4 oz) (01/22 0413)  Intake/Output Summary (Last 24 hours) at 11/03/2024 0816 Last data filed at 11/03/2024 0427 Gross per 24  hour  Intake 642.17 ml  Output 250 ml  Net 392.17 ml    Current Heart Failure Medications:  Loop diuretic: none Beta-Blocker: carvedilol  3.125 mg BID ACEI/ARB/ARNI: none MRA: none SGLT2i: none Other: hydralazine  25 mg TID, isosorbide  mononitrate 30 mg daily  Prior to admission Heart Failure Medications:  Loop diuretic: none Beta-Blocker: metoprolol  tartrate 25 mg BID (last filled 2024) ACEI/ARB/ARNI: losartan /hydrochlorothiazide 100/25 mg daily MRA: none SGLT2i: none Other: hydralazine  100 mg TID  Assessment: 1. Acute systolic heart failure (LVEF 20-25%) global hypokinesis, mild LVH, mildly reduced RV systolic function, moderately elevated pulmonary artery systolic pressure, mild MVR, due  to mixed ICM and NICM. NYHA class II symptoms.   Patient is at [facility]. Unable to visually assess, visit conducted via telephone. -Symptoms: Reports SOB vastly improved, but still present. Reports swelling on one ankle, no abdominal swelling. Denies orthopnea, chest pain, and palpitations. Appetite is poor. -Volume: Unable to visually assess. Per chart: no JVD, no edema. NT-proBNP significantly elevated, could be secondary to atrial fibrillation vs hypervolemia. Patient reported increased urination prior to admission is now back to baseline, however color has slightly darkened. Worsening renal function.  -Hemodynamics: BP significantly elevated, HR 80-90s. -BB: Agree with switching metoprolol  tartrate to carvedilol  for mortality benefit in HFrEF. Can consider optimizing dose pending how BP and HR tolerate addition of other GDMT. -ACEI/ARB/ARNI: Agree with holding losartan /hydrochlorothiazide. Consider transitioning to Entresto 24/26 mg BID vs ARB, for better BP control and improved mortality benefit pending resolution of worsening renal function. Scr 2.19. -MRA: Consider adding spironolactone 25 mg daily pending resolution of worsening renal function, although not directly contraindicated at this time with Scr < 2.5.  -SGLT2i: Consider adding pending copay check. A1c 5.7% (01/25/24), consider rechecking. Endorsed frequent urination at home. -Other: Consider increasing to hydralazine  50 mg TID. Home dose is 100 mg TID, BP is significantly elevated.   Plan: 1) Medication changes recommended at this time: - Consider increasing to hydralazine  50 mg TID.  2) Patient assistance: - Generic Entresto copay is $75.30, Generic Doreen copay is $181.89, Jardiance copay is $204.12 copays so high due to a deductible   3) Education: - Patient has been educated on current HF medications and potential additions to HF medication regimen - Patient verbalizes understanding that over the next few months, these  medication doses may change and more medications may be added to optimize HF regimen - Patient has been educated on basic disease state pathophysiology and goals of therapy   Calton Nash, [email or web address] PharmD Candidate

## 2024-11-03 NOTE — Plan of Care (Signed)
  Problem: Education: Goal: Knowledge of General Education information will improve Description: Including pain rating scale, medication(s)/side effects and non-pharmacologic comfort measures Outcome: Progressing   Problem: Health Behavior/Discharge Planning: Goal: Ability to manage health-related needs will improve Outcome: Progressing   Problem: Clinical Measurements: Goal: Ability to maintain clinical measurements within normal limits will improve Outcome: Progressing Goal: Will remain free from infection Outcome: Progressing Goal: Diagnostic test results will improve Outcome: Progressing Goal: Cardiovascular complication will be avoided Outcome: Progressing   Problem: Elimination: Goal: Will not experience complications related to urinary retention Outcome: Progressing   Problem: Safety: Goal: Ability to remain free from injury will improve Outcome: Progressing   Problem: Skin Integrity: Goal: Risk for impaired skin integrity will decrease Outcome: Progressing

## 2024-11-03 NOTE — Progress Notes (Signed)
 " PROGRESS NOTE  Scott Ritter, is a 64 y.o. male, DOB - 01/11/1961, FMW:[id number]  Admit date - 11/01/2024   Admitting Physician Scott LOISE Hurst, DO  Outpatient Primary MD for the patient is Bacchus, Meade PEDLAR, FNP  LOS - 1  Chief Complaint  Patient presents with   Flank Pain           Brief Narrative:  64 y.o. 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, hypertension, obesity, admitted on 11/01/2024 with new onset A-fib and AKI    -Assessment and Plan: 1)HFrEF--- acute systolic dysfunction CHF-- -echo from 11/02/2024 showed EF of 20 to 25%, global hypokinesis, pulmonary hypertension, no aortic stenosis, no mitral stenosis - Please note that echo from January 2023 had EF of 60 to 65% without wall motion abnormalities at that time -- Cardiology consult appreciated --CTA chest on admission without acute findings -Continue Coreg , isosorbide /hydralazine  combo as ordered --Patient received IV Lasix  on 11/03/2024 reevaluate in a.m. for possible repeat Lasix  dosing -Avoid ACEI/ARB/ARNI due to renal concerns  2)PAFIb--new onset A-fib, A-fib may be contributing to decreased EF as above in #1 -TSH 1.99 -- Coreg  for rate control IV heparin  for stroke prophylaxis - Anticipate transition to Eliquis  if no cardiac interventions planned - Defer decision on possible cardioversion to cardiology service-  3)History of prior stroke about 18 years ago with residual mild left-sided hemiparesis -- Continue aspirin  and atorvastatin  for secondary stroke prophylaxis -- Okay to stop aspirin  once patient is transitioned to Eliquis  for A-fib  4)AKI----acute kidney injury-suspect due to A-fib with RVR with hemodynamic instability and hypoperfusion -   creatinine on admission= 1.97  ,  baseline creatinine = 0.9 to 1.1 (August 2025)    ,  creatinine is now= around 2, -- renally adjust medications, avoid nephrotoxic agents / dehydration  /  hypotension  5) hypokalemia--replace and recheck - Magnesium WNL  6) mild anemia--- no bleeding concerns -Monitor closely while on anticoagulation  7)HLD--continue atorvastatin  and Zetia    Status is: Inpatient   Disposition: The patient is from: Home              Anticipated d/c is to: Home              Anticipated d/c date is: 2 days              Patient currently is not medically stable to d/c. Barriers: Not Clinically Stable-   Code Status :  -  Code Status: Full Code   Family Communication:   NA (patient is alert, awake and coherent)   DVT Prophylaxis  :   - SCDs /Iv Heparin      Lab Results  Component Value Date   PLT 278 11/03/2024    Inpatient Medications  Scheduled Meds:  aspirin  EC  81 mg Oral Daily   atorvastatin   80 mg Oral Daily   carvedilol   3.125 mg Oral BID WC   ezetimibe   10 mg Oral Daily   hydrALAZINE   50 mg Oral TID   isosorbide  mononitrate  30 mg Oral Daily   Continuous Infusions:  heparin  1,100 Units/hr (11/03/24 1213)   PRN Meds:.acetaminophen , melatonin, polyethylene glycol, prochlorperazine    Anti-infectives (From admission, onward)    None       Subjective: Scott Ritter today has no fevers, no emesis,  No chest pain,   - - Dyspnea on exertion improving - Voiding well after Lasix    Objective: Vitals:   11/03/24 0914 11/03/24  9078 11/03/24 1200 11/03/24 1602  BP: (!) 150/90 (!) 150/90 (!) 140/109 123/84  Pulse:  92 89 92  Resp:      Temp:      TempSrc:      SpO2:      Weight:      Height:        Intake/Output Summary (Last 24 hours) at 11/03/2024 1939 Last data filed at 11/03/2024 1803 Gross per 24 hour  Intake 412.2 ml  Output 800 ml  Net -387.8 ml   Filed Weights   11/01/24 2238 11/02/24 0528 11/03/24 0413  Weight: 84.3 kg 83.3 kg 83.7 kg    Physical Exam  Gen:- Awake Alert, no acute distress HEENT:- [place].AT, No sclera icterus Neck-Supple Neck,No JVD,.  Lungs-  CTAB , fair symmetrical air movement CV-  S1, S2 normal, irregularly irregular  abd-  +ve B.Sounds, Abd Soft, No tenderness,    Extremity/Skin:- trace  edema, pedal pulses present  Psych-affect is appropriate, oriented x3 Neuro-mild residual left-sided deficits otherwise, no additional new focal deficits, no tremors  Data Reviewed: I have personally reviewed following labs and imaging studies  CBC: Recent Labs  Lab 11/01/24 1803 11/02/24 0408 11/03/24 0148  WBC 10.8* 10.6* 9.7  NEUTROABS 8.2* 7.6  --   HGB 13.2 12.3* 11.4*  HCT 40.6 37.7* 35.6*  MCV 85.8 86.3 87.3  PLT 341 311 278   Basic Metabolic Panel: Recent Labs  Lab 11/01/24 1803 11/02/24 0408 11/03/24 0148  NA 140 140 139  K 3.3* 3.3* 3.4*  CL 102 104 103  CO2 22 21* 25  GLUCOSE 101* 94 97  BUN 32* 31* 34*  CREATININE 1.97* 1.96* 2.19*  CALCIUM  8.8* 8.9 8.5*  MG 1.7 1.9  --   PHOS  --  3.2  --    GFR: Estimated Creatinine Clearance: 36.6 mL/min (A) (by C-G formula based on SCr of 2.19 mg/dL (H)). Liver Function Tests: Recent Labs  Lab 11/01/24 1803  AST 32  ALT 25  ALKPHOS 105  BILITOT 1.1  PROT 6.7  ALBUMIN 3.5   Radiology Studies: ECHOCARDIOGRAM COMPLETE Result Date: 11/02/2024    ECHOCARDIOGRAM REPORT   Patient Name:   Scott Ritter Date of Exam: 11/02/2024 Medical Rec #:  [id number]          Height:       66.9 in Accession #:    [id number]         Weight:       183.6 lb Date of Birth:  [birth year]          BSA:          1.949 m Patient Age:    63 years           BP:           153/100 mmHg Patient Gender: M                  HR:           73 bpm. Exam Location:  [facility] Procedure: 2D Echo, 3D Echo, Cardiac Doppler, Color Doppler and Strain Analysis            (Both Spectral and Color Flow Doppler were utilized during            procedure). Indications:    Atrial Fibrillation I48.91  History:        Patient has prior history of Echocardiogram examinations, most  recent 10/23/2021. Risk Factors:Hypertension and Dyslipidemia.   Sonographer:    Aida Pizza RCS Referring Phys: [id number] CAROLE N HALL IMPRESSIONS  1. Left ventricular ejection fraction, by estimation, is 20 to 25%. The left ventricle has severely decreased function. The left ventricle demonstrates global hypokinesis. There is mild left ventricular hypertrophy. Left ventricular diastolic parameters  are indeterminate. The average left ventricular global longitudinal strain is -4.8 %. The global longitudinal strain is abnormal.  2. Right ventricular systolic function is mildly reduced. The right ventricular size is normal. There is moderately elevated pulmonary artery systolic pressure.  3. Left atrial size was severely dilated.  4. Right atrial size was mildly dilated.  5. The mitral valve is abnormal. Mild mitral valve regurgitation. No evidence of mitral stenosis.  6. The tricuspid valve is abnormal.  7. The aortic valve is tricuspid. Aortic valve regurgitation is not visualized. No aortic stenosis is present.  8. The inferior vena cava is dilated in size with <50% respiratory variability, suggesting right atrial pressure of 15 mmHg. FINDINGS  Left Ventricle: Left ventricular ejection fraction, by estimation, is 20 to 25%. The left ventricle has severely decreased function. The left ventricle demonstrates global hypokinesis. The average left ventricular global longitudinal strain is -4.8 %. Strain was performed and the global longitudinal strain is abnormal. The left ventricular internal cavity size was normal in size. There is mild left ventricular hypertrophy. Left ventricular diastolic parameters are indeterminate. Right Ventricle: The right ventricular size is normal. Right vetricular wall thickness was not well visualized. Right ventricular systolic function is mildly reduced. There is moderately elevated pulmonary artery systolic pressure. The tricuspid regurgitant velocity is 3.22 m/s, and with an assumed right atrial pressure of 15 mmHg, the estimated right  ventricular systolic pressure is 56.5 mmHg. Left Atrium: Left atrial size was severely dilated. Right Atrium: Right atrial size was mildly dilated. Pericardium: There is no evidence of pericardial effusion. Mitral Valve: The mitral valve is abnormal. Mild mitral valve regurgitation. No evidence of mitral valve stenosis. Tricuspid Valve: The tricuspid valve is abnormal. Tricuspid valve regurgitation is mild . No evidence of tricuspid stenosis. Aortic Valve: The aortic valve is tricuspid. Aortic valve regurgitation is not visualized. No aortic stenosis is present. Aortic valve mean gradient measures 2.0 mmHg. Aortic valve peak gradient measures 3.7 mmHg. Aortic valve area, by VTI measures 1.61 cm. Pulmonic Valve: The pulmonic valve was not well visualized. Pulmonic valve regurgitation is mild to moderate. No evidence of pulmonic stenosis. Aorta: The aortic root is normal in size and structure and the ascending aorta was not well visualized. Venous: The inferior vena cava is dilated in size with less than 50% respiratory variability, suggesting right atrial pressure of 15 mmHg. IAS/Shunts: No atrial level shunt detected by color flow Doppler.  LEFT VENTRICLE PLAX 2D LVIDd:         5.60 cm LVIDs:         4.70 cm      2D Longitudinal Strain LV PW:         1.10 cm      2D Strain GLS Avg:     -4.8 % LV IVS:        1.00 cm LVOT diam:     2.10 cm LV SV:         29 LV SV Index:   15           3D Volume EF: LVOT Area:     3.46 cm     3D EF:  35 %                             LV EDV:       190 ml                             LV ESV:       122 ml LV Volumes (MOD)            LV SV:        67 ml LV vol d, MOD A2C: 147.0 ml LV vol d, MOD A4C: 188.0 ml LV vol s, MOD A2C: 101.0 ml LV vol s, MOD A4C: 116.0 ml LV SV MOD A2C:     46.0 ml LV SV MOD A4C:     188.0 ml LV SV MOD BP:      60.6 ml RIGHT VENTRICLE RV S prime:     7.46 cm/s TAPSE (M-mode): 1.3 cm LEFT ATRIUM              Index        RIGHT ATRIUM           Index LA diam:         5.00 cm  2.57 cm/m   RA Area:     22.50 cm LA Vol (A2C):   177.0 ml 90.84 ml/m  RA Volume:   71.10 ml  36.49 ml/m LA Vol (A4C):   137.0 ml 70.31 ml/m LA Biplane Vol: 156.0 ml 80.06 ml/m  AORTIC VALVE AV Area (Vmax):    1.67 cm AV Area (Vmean):   1.65 cm AV Area (VTI):     1.61 cm AV Vmax:           96.63 cm/s AV Vmean:          65.033 cm/s AV VTI:            0.179 m AV Peak Grad:      3.7 mmHg AV Mean Grad:      2.0 mmHg LVOT Vmax:         46.59 cm/s LVOT Vmean:        31.033 cm/s LVOT VTI:          0.083 m LVOT/AV VTI ratio: 0.46  AORTA Ao Root diam: 3.30 cm MITRAL VALVE                TRICUSPID VALVE MV Area (PHT): 5.02 cm     TR Peak grad:   41.5 mmHg MV Decel Time: 151 msec     TR Vmax:        322.00 cm/s MR Peak grad: 111.1 mmHg MR Mean grad: 67.0 mmHg     SHUNTS MR Vmax:      527.00 cm/s   Systemic VTI:  0.08 m MR Vmean:     378.0 cm/s    Systemic Diam: 2.10 cm MV E velocity: 108.00 cm/s Dorn Ross MD Electronically signed by Dorn Ross MD Signature Date/Time: 11/02/2024/3:48:18 PM    Final    CT ABDOMEN PELVIS W CONTRAST Result Date: 11/01/2024 EXAM: CT ABDOMEN AND PELVIS WITH CONTRAST 11/01/2024 07:58:39 PM TECHNIQUE: CT of the abdomen and pelvis was performed with the administration of intravenous contrast. Multiplanar reformatted images are provided for review. Automated exposure control, iterative reconstruction, and/or weight-based adjustment of the mA/kV was utilized to reduce the radiation dose to as low as reasonably achievable. COMPARISON:  None available. CLINICAL HISTORY: FINDINGS: LOWER CHEST: Trace right pleural effusion. LIVER: The liver is unremarkable. GALLBLADDER AND BILE DUCTS: Multiple small gallstones without associated inflammatory changes. No biliary ductal dilatation. SPLEEN: No acute abnormality. PANCREAS: No acute abnormality. ADRENAL GLANDS: No acute abnormality. KIDNEYS, URETERS AND BLADDER: Subcentimeter bilateral renal cysts, benign (Bosniak 1). No  stones in the kidneys or ureters. No hydronephrosis. No perinephric or periureteral stranding. Urinary bladder is unremarkable. GI AND BOWEL: Normal appendix (image 48). Stomach demonstrates no acute abnormality. There is no bowel obstruction. PERITONEUM AND RETROPERITONEUM: No ascites. No free air. Small fat containing umbilical hernia. VASCULATURE: Moderate atherosclerotic calcification of the abdominal aorta without aneurysm. LYMPH NODES: No lymphadenopathy. REPRODUCTIVE ORGANS: Prostate is unremarkable. BONES AND SOFT TISSUES: No acute osseous abnormality. Tiny fat-containing bilateral inguinal hernias. IMPRESSION: 1. No acute abdominopelvic abnormality. 2. Cholelithiasis, without CT evidence of acute cholecystitis. 3. Aortic Atherosclerosis (ICD10-I70.0). Electronically signed by: Pinkie Pebbles MD 11/01/2024 08:07 PM EST RP Workstation: [id number]   CT Angio Chest PE W/Cm &/Or Wo Cm Result Date: 11/01/2024 EXAM: CTA of the Chest with contrast for PE 11/01/2024 07:58:39 PM TECHNIQUE: CTA of the chest was performed after the administration of intravenous contrast. Multiplanar reformatted images are provided for review. MIP images are provided for review. Automated exposure control, iterative reconstruction, and/or weight based adjustment of the mA/kV was utilized to reduce the radiation dose to as low as reasonably achievable. COMPARISON: None available. CLINICAL HISTORY: SOB, right sided pain, lethargy FINDINGS: PULMONARY ARTERIES: Pulmonary arteries are adequately opacified for evaluation. No pulmonary embolism. Main pulmonary artery is normal in caliber. MEDIASTINUM: The heart and pericardium demonstrate no acute abnormality. Mild thoracic aortic atherosclerosis. There is no acute abnormality of the thoracic aorta. LYMPH NODES: No mediastinal, hilar or axillary lymphadenopathy. LUNGS AND PLEURA: Mild subpleural scarring/atelectasis in the posterior right upper lobe and bilateral lower lobes. Trace right  pleural effusion. No pneumothorax. UPPER ABDOMEN: Limited images of the upper abdomen are unremarkable. SOFT TISSUES AND BONES: No acute bone or soft tissue abnormality. IMPRESSION: 1. No pulmonary embolism. 2. Trace right pleural effusion. 3. Aortic Atherosclerosis (ICD10-I70.0). Electronically signed by: Pinkie Pebbles MD 11/01/2024 08:05 PM EST RP Workstation: [id number]   Scheduled Meds:  aspirin  EC  81 mg Oral Daily   atorvastatin   80 mg Oral Daily   carvedilol   3.125 mg Oral BID WC   ezetimibe   10 mg Oral Daily   hydrALAZINE   50 mg Oral TID   isosorbide  mononitrate  30 mg Oral Daily   Continuous Infusions:  heparin  1,100 Units/hr (11/03/24 1213)    LOS: 1 day   Rendall Carwin M.D on 11/03/2024 at 7:39 PM  Go to [email or web address] - for contact info  [facility] - Office  [phone]  If 7PM-7AM, please contact night-coverage [email or web address] 11/03/2024, 7:39 PM    "

## 2024-11-04 ENCOUNTER — Telehealth: Payer: Self-pay | Admitting: Pharmacist

## 2024-11-04 ENCOUNTER — Telehealth (HOSPITAL_COMMUNITY): Payer: Self-pay

## 2024-11-04 ENCOUNTER — Other Ambulatory Visit (HOSPITAL_COMMUNITY): Payer: Self-pay

## 2024-11-04 DIAGNOSIS — I4891 Unspecified atrial fibrillation: Secondary | ICD-10-CM | POA: Diagnosis not present

## 2024-11-04 DIAGNOSIS — I480 Paroxysmal atrial fibrillation: Secondary | ICD-10-CM | POA: Diagnosis not present

## 2024-11-04 DIAGNOSIS — I5021 Acute systolic (congestive) heart failure: Secondary | ICD-10-CM

## 2024-11-04 DIAGNOSIS — I5023 Acute on chronic systolic (congestive) heart failure: Secondary | ICD-10-CM | POA: Diagnosis not present

## 2024-11-04 DIAGNOSIS — N179 Acute kidney failure, unspecified: Secondary | ICD-10-CM | POA: Diagnosis not present

## 2024-11-04 LAB — CBC
HCT: 36.2 % — ABNORMAL LOW (ref 39.0–52.0)
Hemoglobin: 11.7 g/dL — ABNORMAL LOW (ref 13.0–17.0)
MCH: 27.9 pg (ref 26.0–34.0)
MCHC: 32.3 g/dL (ref 30.0–36.0)
MCV: 86.4 fL (ref 80.0–100.0)
Platelets: 296 K/uL (ref 150–400)
RBC: 4.19 MIL/uL — ABNORMAL LOW (ref 4.22–5.81)
RDW: 15.7 % — ABNORMAL HIGH (ref 11.5–15.5)
WBC: 9 K/uL (ref 4.0–10.5)
nRBC: 0 % (ref 0.0–0.2)

## 2024-11-04 LAB — HEPARIN LEVEL (UNFRACTIONATED): Heparin Unfractionated: 0.39 [IU]/mL (ref 0.30–0.70)

## 2024-11-04 LAB — BASIC METABOLIC PANEL WITH GFR
Anion gap: 12 (ref 5–15)
BUN: 30 mg/dL — ABNORMAL HIGH (ref 8–23)
CO2: 25 mmol/L (ref 22–32)
Calcium: 8.8 mg/dL — ABNORMAL LOW (ref 8.9–10.3)
Chloride: 104 mmol/L (ref 98–111)
Creatinine, Ser: 2.02 mg/dL — ABNORMAL HIGH (ref 0.61–1.24)
GFR, Estimated: 37 mL/min — ABNORMAL LOW
Glucose, Bld: 85 mg/dL (ref 70–99)
Potassium: 3.4 mmol/L — ABNORMAL LOW (ref 3.5–5.1)
Sodium: 141 mmol/L (ref 135–145)

## 2024-11-04 LAB — MAGNESIUM: Magnesium: 1.7 mg/dL (ref 1.7–2.4)

## 2024-11-04 MED ORDER — FUROSEMIDE 10 MG/ML IJ SOLN
60.0000 mg | Freq: Once | INTRAMUSCULAR | Status: AC
  Filled 2024-11-04: qty 6

## 2024-11-04 MED ORDER — LABETALOL HCL 5 MG/ML IV SOLN
10.0000 mg | INTRAVENOUS | Status: DC | PRN
  Administered 2024-11-04: 10 mg via INTRAVENOUS
  Filled 2024-11-04: qty 1

## 2024-11-04 MED ORDER — MAGNESIUM SULFATE 2 GM/50ML IV SOLN
2.0000 g | Freq: Once | INTRAVENOUS | Status: AC
  Administered 2024-11-04: 2 g via INTRAVENOUS
  Filled 2024-11-04: qty 50

## 2024-11-04 MED ORDER — HYDRALAZINE HCL 50 MG PO TABS
75.0000 mg | ORAL_TABLET | Freq: Three times a day (TID) | ORAL | Status: DC
  Administered 2024-11-04 – 2024-11-05 (×3): 75 mg via ORAL
  Filled 2024-11-04 (×3): qty 1

## 2024-11-04 MED ORDER — FUROSEMIDE 10 MG/ML IJ SOLN
INTRAMUSCULAR | Status: AC
  Administered 2024-11-04: 60 mg via INTRAVENOUS
  Filled 2024-11-04: qty 6

## 2024-11-04 MED ORDER — POTASSIUM CHLORIDE CRYS ER 20 MEQ PO TBCR
40.0000 meq | EXTENDED_RELEASE_TABLET | Freq: Once | ORAL | Status: AC
  Administered 2024-11-04: 40 meq via ORAL
  Filled 2024-11-04: qty 2

## 2024-11-04 MED ORDER — POTASSIUM CHLORIDE CRYS ER 20 MEQ PO TBCR
60.0000 meq | EXTENDED_RELEASE_TABLET | Freq: Once | ORAL | Status: AC
  Administered 2024-11-04: 60 meq via ORAL
  Filled 2024-11-04: qty 3

## 2024-11-04 MED ORDER — CARVEDILOL 3.125 MG PO TABS
6.2500 mg | ORAL_TABLET | Freq: Two times a day (BID) | ORAL | Status: DC
  Administered 2024-11-04 – 2024-11-05 (×2): 6.25 mg via ORAL
  Filled 2024-11-04 (×2): qty 2

## 2024-11-04 MED ORDER — APIXABAN 5 MG PO TABS
5.0000 mg | ORAL_TABLET | Freq: Two times a day (BID) | ORAL | Status: DC
  Administered 2024-11-04 – 2024-11-05 (×3): 5 mg via ORAL
  Filled 2024-11-04 (×3): qty 1

## 2024-11-04 NOTE — Progress Notes (Signed)
 Mobility Specialist Progress Note:    11/04/24 1430  Mobility  Activity Ambulated with assistance  Level of Assistance Independent  Assistive Device [place]  Distance Ambulated (ft) 140 ft  Range of Motion/Exercises Active;All extremities  Activity Response Tolerated well  Mobility Referral Yes  Mobility visit 1 Mobility  Mobility Specialist Start Time (ACUTE ONLY) 1430  Mobility Specialist Stop Time (ACUTE ONLY) 1450  Mobility Specialist Time Calculation (min) (ACUTE ONLY) 20 min   Pt received in chair, agreeable to mobility. Independently able to stand and ambulate with straight cane. Tolerated well, asx throughout. Returned to chair, all needs met.   Kileen Lange Mobility Specialist Please contact via Special Educational Needs Teacher or  Rehab office at [phone]

## 2024-11-04 NOTE — Progress Notes (Deleted)
 Heart Failure Stewardship Pharmacy Note  PCP: Edman Meade PEDLAR, FNP PCP-Cardiologist: None  HPI: Scott Ritter is a 64 y.o. 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  Also complained of intermittent sharp right flank pain lasting about 20 seconds then resolving spontaneously. Endorsed frequent urination.  No chest pain at home, however had an episode of nonradiating left-sided sharp chest pain while in the ER that lasted 10 to 15 seconds then resolved spontaneously.   On admission, proBNP was 89604, HS-troponin was 132, K 3.3, Scr 1.97, BUN 32, Mg 1.7, Ph 3.2, LFTs normal, and normal TSH. Chest x-ray noted no pulmonary embolism, trace right pleural effusion, aortic atherosclerosis.   Pertinent cardiac history: - TTE 01/2019 noted LVEF 55-60%, mild LVH, diastolic parameters consistent with impaired relaxation - TTE 10/2021 noted LVEF 60-65%, mild LVH - TTE 11/02/24 noted LVEF 20-25%, global hypokinesis, mild LVH, mildly reduced RV systolic function, moderately elevated pulmonary artery systolic pressure, mild MVR  Pertinent Lab Values: Creat  Date Value Ref Range Status  04/03/2021 1.11 0.70 - 1.33 mg/dL Final    Comment:    For patients >44 years of age, the reference limit for Creatinine is approximately 13% higher for people identified as African-American. .    Creatinine, Ser  Date Value Ref Range Status  11/04/2024 2.02 (H) 0.61 - 1.24 mg/dL Final   BUN  Date Value Ref Range Status  11/04/2024 30 (H) 8 - 23 mg/dL Final  95/85/7974 13 8 - 27 mg/dL Final   Potassium  Date Value Ref Range Status  11/04/2024 3.4 (L) 3.5 - 5.1 mmol/L Final   Sodium  Date Value Ref Range Status  11/04/2024 141 135 - 145 mmol/L Final  01/25/2024 137 134 - 144 mmol/L Final   B Natriuretic Peptide  Date Value Ref Range  Status  05/25/2024 297.0 (H) 0.0 - 100.0 pg/mL Final    Comment:    Performed at [facility], [address] [place], [place] [address]   Magnesium   Date Value Ref Range Status  11/04/2024 1.7 1.7 - 2.4 mg/dL Final    Comment:    Performed at [facility], [address], [place], [place] [address]   Hgb A1c MFr Bld  Date Value Ref Range Status  01/25/2024 5.7 (H) 4.8 - 5.6 % Final    Comment:             Prediabetes: 5.7 - 6.4          Diabetes: >6.4          Glycemic control for adults with diabetes: <7.0    TSH  Date Value Ref Range Status  11/03/2024 1.990 0.350 - 4.500 uIU/mL Final    Comment:    Performed at [facility], [address], [place], [place] [address]  01/25/2024 0.992 0.450 - 4.500 uIU/mL Final    Vital Signs: Temp:  [97.6 F (36.4 C)-98.3 F (36.8 C)] 98.2 F (36.8 C) (01/23 0508) Pulse Rate:  [87-107] 91 (01/23 0508) Cardiac Rhythm: Atrial fibrillation (01/22 1928) Resp:  [16-21] 21 (01/23 0508) BP: (123-161)/(69-109) 156/105 (01/23 0508) SpO2:  [95 %-99 %] 96 % (01/23 0508) Weight:  [83.3 kg (183 lb 10.3 oz)] 83.3 kg (183 lb 10.3 oz) (01/23 0508)  Intake/Output Summary (Last 24 hours) at 11/04/2024 0529 Last data filed at 11/04/2024 0514 Gross per 24  hour  Intake 300 ml  Output 1350 ml  Net -1050 ml    Current Heart Failure Medications:  Loop diuretic: furosemide  IV 60 mg daily Beta-Blocker: carvedilol  3.125 mg BID ACEI/ARB/ARNI: none MRA: none SGLT2i: none Other: hydralazine  25 mg TID, isosorbide  mononitrate 30 mg daily  Prior to admission Heart Failure Medications:  Loop diuretic: none Beta-Blocker: metoprolol  tartrate 25 mg BID (last filled 2024) ACEI/ARB/ARNI: losartan /hydrochlorothiazide 100/25 mg daily MRA: none SGLT2i: none Other: hydralazine  100 mg TID  Assessment: 1. Acute systolic heart failure (LVEF 20-25%) global hypokinesis, mild LVH, mildly reduced RV systolic function, moderately elevated pulmonary artery systolic  pressure, mild MVR, due to mixed ICM and NICM. NYHA class II symptoms.   Patient is at [facility]. Unable to visually assess, visit conducted via telephone. -Symptoms: Reports feeling good, a little bit than yesterday. Reports appetite is still poor, but improved today. Reports left ankle swelling still present. Denies orthopnea, chest pain, and palpitations.  -Volume: Unable to visually assess. Per chart: no JVD, 1+ pitting edema along LLE, trace edema along RLE. NT-proBNP significantly elevated, could be secondary to atrial fibrillation vs hypervolemia. Agree with furosemide  IV 60 mg daily. Renal function slightly improved today. -Hemodynamics: BP significantly elevated, HR 80-90s. -BB: Continue carvedilol  3.125 mg BID. Can consider optimizing dose pending how BP and HR tolerate addition of other GDMT. -ACEI/ARB/ARNI: Agree with holding losartan /hydrochlorothiazide. Consider transitioning to Entresto 24/26 mg BID vs ARB, for better BP control and improved mortality benefit pending resolution of worsening renal function. Scr 2.02. -MRA: Can consider adding spironolactone 25 mg daily prior to discharge pending resolution of worsening renal function. Renal function slightly improved today. Would not add if Scr >2.5.  -SGLT2i: Consider adding Farxiga 10 mg daily, would offer additional diuresis benefit given LLE. A1c 5.7% (01/25/24), consider rechecking. Endorsed frequent urination at home. -Other: Consider increasing to home dose hydralazine  100 mg TID, BP is significantly elevated.  Plan: 1) Medication changes recommended at this time: - Consider increasing to home dose hydralazine  100 mg TID. - Replete potassium chloride  ER 40 mEq once to reach K >4. - Consider adding Farxiga 10 mg daily.   2) Patient assistance: - Generic Entresto copay is $75.30, Generic Doreen copay is $181.89, Jardiance copay is $204.12 copays so high due to a deductible   3) Education: - Patient has been educated on  current HF medications and potential additions to HF medication regimen - Patient verbalizes understanding that over the next few months, these medication doses may change and more medications may be added to optimize HF regimen - Patient has been educated on basic disease state pathophysiology and goals of therapy   Calton Nash, [email or web address] PharmD Candidate

## 2024-11-04 NOTE — Progress Notes (Signed)
 Heparin  drip was stopped and po Eliquis  was given.

## 2024-11-04 NOTE — Telephone Encounter (Signed)
 Heart Failure Stewardship Pharmacy Note  PCP: Edman Meade PEDLAR, FNP PCP-Cardiologist: Alvan Carrier, MD  HPI: Scott Ritter is a 64 y.o. 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  Also complained of intermittent sharp right flank pain lasting about 20 seconds then resolving spontaneously. Endorsed frequent urination.  No chest pain at home, however had an episode of nonradiating left-sided sharp chest pain while in the ER that lasted 10 to 15 seconds then resolved spontaneously.    On admission, proBNP was 89604, HS-troponin was 132, K 3.3, Scr 1.97, BUN 32, Mg 1.7, Ph 3.2, LFTs normal, and normal TSH. Chest x-ray noted no pulmonary embolism, trace right pleural effusion, aortic atherosclerosis.   Pertinent cardiac history: - TTE 01/2019 noted LVEF 55-60%, mild LVH, diastolic parameters consistent with impaired relaxation - TTE 10/2021 noted LVEF 60-65%, mild LVH - TTE 11/02/24 noted LVEF 20-25%, global hypokinesis, mild LVH, mildly reduced RV systolic function, moderately elevated pulmonary artery systolic pressure, mild MVR  Pertinent Lab Values: Creat  Date Value Ref Range Status  04/03/2021 1.11 0.70 - 1.33 mg/dL Final    Comment:    For patients >16 years of age, the reference limit for Creatinine is approximately 13% higher for people identified as African-American. .    Creatinine, Ser  Date Value Ref Range Status  11/04/2024 2.02 (H) 0.61 - 1.24 mg/dL Final   BUN  Date Value Ref Range Status  11/04/2024 30 (H) 8 - 23 mg/dL Final  95/85/7974 13 8 - 27 mg/dL Final   Potassium  Date Value Ref Range Status  11/04/2024 3.4 (L) 3.5 - 5.1 mmol/L Final   Sodium  Date Value Ref Range Status  11/04/2024 141 135 - 145 mmol/L Final  01/25/2024 137 134 - 144 mmol/L Final   B Natriuretic Peptide   Date Value Ref Range Status  05/25/2024 297.0 (H) 0.0 - 100.0 pg/mL Final    Comment:    Performed at [facility], [address] [place], [place] [address]   Magnesium   Date Value Ref Range Status  11/04/2024 1.7 1.7 - 2.4 mg/dL Final    Comment:    Performed at [facility], [address] [place], [place] [address]   Hgb A1c MFr Bld  Date Value Ref Range Status  01/25/2024 5.7 (H) 4.8 - 5.6 % Final    Comment:             Prediabetes: 5.7 - 6.4          Diabetes: >6.4          Glycemic control for adults with diabetes: <7.0    TSH  Date Value Ref Range Status  11/03/2024 1.990 0.350 - 4.500 uIU/mL Final    Comment:    Performed at [facility], [address] [place], [place] [address]  01/25/2024 0.992 0.450 - 4.500 uIU/mL Final    Vital Signs: Temp:  [97.6 F (36.4 C)-98.3 F (36.8 C)] 98.2 F (36.8 C) (01/23 0508) Pulse Rate:  [87-107] 91 (01/23 0508) Cardiac Rhythm: Atrial fibrillation (01/22 1928) Resp:  [16-21] 21 (01/23 0508) BP: (123-161)/(69-109) 156/105 (01/23 0508) SpO2:  [95 %-99 %] 96 % (01/23 0508) Weight:  [83.3 kg (183 lb 10.3 oz)] 83.3 kg (183 lb 10.3 oz) (01/23 0508)  Intake/Output Summary (Last 24 hours) at 11/04/2024 0529 Last data filed at 11/04/2024 [phone]  Gross per 24 hour  Intake 300 ml  Output 1350 ml  Net -1050 ml    Current Heart Failure Medications:  Loop diuretic: furosemide  IV 60 mg daily Beta-Blocker: carvedilol  3.125 mg BID ACEI/ARB/ARNI: none MRA: none SGLT2i: none Other: hydralazine  75 mg TID, isosorbide  mononitrate 30 mg daily  Prior to admission Heart Failure Medications:  Loop diuretic: none Beta-Blocker: metoprolol  tartrate 25 mg BID (last filled 2024) ACEI/ARB/ARNI: losartan /hydrochlorothiazide 100/25 mg daily MRA: none SGLT2i: none Other: hydralazine  100 mg TID  Assessment: 1. Acute systolic heart failure (LVEF 20-25%) global hypokinesis, mild LVH, mildly reduced RV systolic function, moderately elevated  pulmonary artery systolic pressure, mild MVR, due to mixed ICM and NICM. NYHA class II symptoms.   Patient is at [facility]. Unable to perform in-person assessment; visit conducted via telephone. -Symptoms: Reports feeling good, mildly improved from yesterday. Reports appetite is still poor, but improving today. Reports left ankle swelling is still present. Denies orthopnea, chest pain, and palpitations.  -Volume: Unable to visually assess. Patient noted in chart review to have JVD, 1+ pitting edema along LLE, trace edema along RLE. NT-proBNP significantly elevated, could be secondary to atrial fibrillation vs hypervolemia. Agree with furosemide  IV 60 mg daily. Renal function slightly improved today. -Hemodynamics: BP significantly elevated, HR 80-90s. -BB: Continue carvedilol  3.125 mg BID. Can consider optimizing dose pending how BP and HR tolerate addition of other GDMT. -ACEI/ARB/ARNI: Agree with holding losartan /hydrochlorothiazide. Consider transitioning to Entresto 24/26 mg BID vs ARB, for better BP control and improved mortality benefit pending resolution of worsening renal function. Scr 2.02. -MRA: Can consider adding spironolactone 25 mg daily prior to discharge pending resolution of worsening renal function. Renal function slightly improved today. Would not add if Scr >2.5.  -SGLT2i: Consider adding Farxiga 10 mg daily, would offer additional diuresis benefit given LLE. A1c 5.7% (01/25/24), consider rechecking. Endorsed frequent urination at home. -Other: Consider increasing to home dose hydralazine  100 mg TID, BP is significantly elevated.  Plan: 1) Medication changes recommended at this time: - Consider increasing to home dose hydralazine  100 mg TID. - Replete potassium chloride  ER 40 mEq once to reach K >4. - Consider adding Farxiga 10 mg daily.   2) Patient assistance: - Generic Entresto copay is $75.30, Generic Doreen copay is $181.89, Bernadine copay is $204.12 copays so high due  to a deductible  - Patient is not eligible for copay cards, however, he may qualify for a grant. Please reach out if planning to initiate SGLT2i.  3) Education: - Patient has been educated on current HF medications and potential additions to HF medication regimen - Patient verbalizes understanding that over the next few months, these medication doses may change and more medications may be added to optimize HF regimen - Patient has been educated on basic disease state pathophysiology and goals of therapy   Patient seen alongside Calton Nash, [place] PharmD Candidate  Please do not hesitate to reach out with questions or concerns,  Jaun Bash, PharmD, CPP, BCPS, [facility] Heart Failure Pharmacist  Phone - [phone] 11/04/2024 12:01 PM

## 2024-11-04 NOTE — Progress Notes (Signed)
 BP 163/98. Notified MD and he gave the okay to administer the 1600 dose of hydralazine  now.
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
## 2024-11-04 NOTE — Progress Notes (Signed)
 " PROGRESS NOTE  Scott Ritter, is a 64 y.o. male, DOB - December 02, 1960, FMW:[id number]  Admit date - 11/01/2024   Admitting Physician Terry LOISE Hurst, DO  Outpatient Primary MD for the patient is Bacchus, Meade PEDLAR, FNP  LOS - 2  Chief Complaint  Patient presents with   Flank Pain           Brief Narrative:  64 y.o. 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, hypertension, obesity, admitted on 11/01/2024 with new onset A-fib and AKI    -Assessment and Plan: 1)HFrEF--- acute systolic dysfunction CHF-- -echo from 11/02/2024 showed EF of 20 to 25%, global hypokinesis, pulmonary hypertension, no aortic stenosis, no mitral stenosis - Please note that echo from January 2023 had EF of 60 to 65% without wall motion abnormalities at that time -- Cardiology consult appreciated --CTA chest on admission without acute findings -Continue Coreg , isosorbide /hydralazine  combo as ordered (titrating Coreg  and hydralazine  up) --Patient received additional IV Lasix  on 11/04/2024 reevaluate in a.m. for possible repeat Lasix  dosing Avoid ACEI/ARB/ARNI/MRA/SGLT2i  due to renal concerns  2)PAFIb--new onset A-fib, A-fib may be contributing to decreased EF as above in #1 -TSH 1.99 -- Coreg  for rate control and Apixiban for stroke prophylaxis - Defer decision on possible cardioversion to cardiology service-  3)History of prior stroke about 18 years ago with residual mild left-sided hemiparesis -- Continue aspirin  and atorvastatin  for secondary stroke prophylaxis -- Okay to stop aspirin  while on Eliquis  for A-fib  4)AKI----acute kidney injury-suspect due to A-fib with RVR with hemodynamic instability and hypoperfusion -   creatinine on admission= 1.97  ,  baseline creatinine = 0.9 to 1.1 (August 2025)    ,  creatinine is now= around 2, -- renally adjust medications, avoid nephrotoxic agents / dehydration  / hypotension  5)  hypokalemia/hypomagnesemia--replace and recheck  6) mild anemia--- no bleeding concerns -Monitor closely while on anticoagulation  7)HLD--continue atorvastatin  and Zetia    Status is: Inpatient   Disposition: The patient is from: Home              Anticipated d/c is to: Home              Anticipated d/c date is: 1 day              Patient currently is not medically stable to d/c. Barriers: Not Clinically Stable-   Code Status :  -  Code Status: Full Code   Family Communication:   NA (patient is alert, awake and coherent)   DVT Prophylaxis  :   - SCDs /Iv Heparin    apixaban  (ELIQUIS ) tablet 5 mg   Lab Results  Component Value Date   PLT 296 11/04/2024   Inpatient Medications  Scheduled Meds:  apixaban   5 mg Oral BID   atorvastatin   80 mg Oral Daily   carvedilol   6.25 mg Oral BID WC   ezetimibe   10 mg Oral Daily   hydrALAZINE   75 mg Oral TID   isosorbide  mononitrate  30 mg Oral Daily   potassium chloride   40 mEq Oral Once   Continuous Infusions:  PRN Meds:.acetaminophen , labetalol , melatonin, polyethylene glycol, prochlorperazine    Anti-infectives (From admission, onward)    None       Subjective: Kay Converse today has no fevers, no emesis,  No chest pain,   - - Continues to diurese well - No significant dyspnea at rest, some dyspnea with exertion   Objective: Vitals:   11/04/24  1300 11/04/24 1400 11/04/24 1600 11/04/24 1730  BP: (!) 163/98 (!) 149/112 (!) 154/120 (!) 143/95  Pulse: 98 92 85 82  Resp:      Temp: (!) 97.4 F (36.3 C)     TempSrc:      SpO2: 98%     Weight:      Height:        Intake/Output Summary (Last 24 hours) at 11/04/2024 1830 Last data filed at 11/04/2024 1400 Gross per 24 hour  Intake 300 ml  Output 1450 ml  Net -1150 ml   Filed Weights   11/02/24 0528 11/03/24 0413 11/04/24 0508  Weight: 83.3 kg 83.7 kg 83.3 kg    Physical Exam  Gen:- Awake Alert, no acute distress HEENT:- [place].AT, No sclera  icterus Neck-Supple Neck,No JVD,.  Lungs-  CTAB , fair symmetrical air movement CV- S1, S2 normal, irregularly irregular  abd-  +ve B.Sounds, Abd Soft, No tenderness,    Extremity/Skin:- trace  edema, pedal pulses present  Psych-affect is appropriate, oriented x3 Neuro-mild residual left-sided deficits (baseline) otherwise, no additional new focal deficits, no tremors  Data Reviewed: I have personally reviewed following labs and imaging studies  CBC: Recent Labs  Lab 11/01/24 1803 11/02/24 0408 11/03/24 0148 11/04/24 0418  WBC 10.8* 10.6* 9.7 9.0  NEUTROABS 8.2* 7.6  --   --   HGB 13.2 12.3* 11.4* 11.7*  HCT 40.6 37.7* 35.6* 36.2*  MCV 85.8 86.3 87.3 86.4  PLT 341 311 278 296   Basic Metabolic Panel: Recent Labs  Lab 11/01/24 1803 11/02/24 0408 11/03/24 0148 11/04/24 0418  NA 140 140 139 141  K 3.3* 3.3* 3.4* 3.4*  CL 102 104 103 104  CO2 22 21* 25 25  GLUCOSE 101* 94 97 85  BUN 32* 31* 34* 30*  CREATININE 1.97* 1.96* 2.19* 2.02*  CALCIUM  8.8* 8.9 8.5* 8.8*  MG 1.7 1.9  --  1.7  PHOS  --  3.2  --   --    GFR: Estimated Creatinine Clearance: 39.7 mL/min (A) (by C-G formula based on SCr of 2.02 mg/dL (H)). Liver Function Tests: Recent Labs  Lab 11/01/24 1803  AST 32  ALT 25  ALKPHOS 105  BILITOT 1.1  PROT 6.7  ALBUMIN 3.5   Scheduled Meds:  apixaban   5 mg Oral BID   atorvastatin   80 mg Oral Daily   carvedilol   6.25 mg Oral BID WC   ezetimibe   10 mg Oral Daily   hydrALAZINE   75 mg Oral TID   isosorbide  mononitrate  30 mg Oral Daily   potassium chloride   40 mEq Oral Once   Continuous Infusions:   LOS: 2 days   Rendall Carwin M.D on 11/04/2024 at 6:30 PM  Go to [email or web address] - for contact info  [facility] - Office  [phone]  If 7PM-7AM, please contact night-coverage [email or web address] 11/04/2024, 6:30 PM    "

## 2024-11-04 NOTE — Consult Note (Signed)
 PHARMACY - ANTICOAGULATION CONSULT NOTE  Pharmacy Consult for Heparin  Indication: atrial fibrillation  Allergies[1]  Patient Measurements: Height: 5' 7 (170.2 cm) Weight: 83.3 kg (183 lb 10.3 oz) IBW/kg (Calculated) : 66.1 HEPARIN  DW (KG): 84.2  Vital Signs: Temp: 98.2 F (36.8 C) (01/23 0508) Temp Source: Oral (01/23 0508) BP: 156/105 (01/23 0508) Pulse Rate: 91 (01/23 0508)  Labs: Recent Labs    11/01/24 1803 11/02/24 0408 11/02/24 1050 11/02/24 1926 11/03/24 0148 11/04/24 0418  HGB 13.2 12.3*  --   --  11.4* 11.7*  HCT 40.6 37.7*  --   --  35.6* 36.2*  PLT 341 311  --   --  278 296  APTT 27  --   --   --   --   --   LABPROT 13.9  --   --   --   --   --   INR 1.0  --   --   --   --   --   HEPARINUNFRC  --  0.64   < > 0.60 0.39 0.39  CREATININE 1.97* 1.96*  --   --  2.19* 2.02*   < > = values in this interval not displayed.    Estimated Creatinine Clearance: 39.7 mL/min (A) (by C-G formula based on SCr of 2.02 mg/dL (H)).   Medical History: Past Medical History:  Diagnosis Date   GERD (gastroesophageal reflux disease)    Hypertension    Stroke (HCC)    left side deficits    Medications:  No history of chronic anticoagulant use PTA  Assessment: 64 year old male who presents emergency department chief complaint of right abdominal pain and flank pain for approximate the past 3 days. Cardiac monitor showing atrial fibrillation. Pharmacy has been consulted to initiate and dose continuous heparin  infusion.  Heparin  level 0.39, therapeutic  Goal of Therapy:  Heparin  level 0.3-0.7 units/ml Monitor platelets by anticoagulation protocol: Yes   Plan:  Cont heparin  1100 units/hr Daily CBC and heparin  level Monitor for bleeding F/U PO anticoag plan  Autry Droege, BS Pharm D, BCPS Clinical Pharmacist            [1] No Known Allergies

## 2024-11-04 NOTE — Care Management Important Message (Signed)
 Important Message  Patient Details  Name: Scott Ritter MRN: [id number] Date of Birth: 05-31-1961   Important Message Given:  Yes - Medicare IM     [name] [name] 11/04/2024, 4:24 PM

## 2024-11-04 NOTE — Telephone Encounter (Signed)
 Pharmacy Patient Advocate Encounter  Insurance verification completed.    The patient is insured through [employer]. Patient has Medicare and is not eligible for a copay card, but may be able to apply for patient assistance or Medicare RX Payment Plan (Patient Must reach out to their plan, if eligible for payment plan), if available.    Ran test claim for Eliquis  5mg  tablets and the current 30 day co-pay is $249 due to deductible.   This test claim was processed through [facility]- copay amounts may vary at other pharmacies due to pharmacy/plan contracts, or as the patient moves through the different stages of their insurance plan.

## 2024-11-05 DIAGNOSIS — N179 Acute kidney failure, unspecified: Secondary | ICD-10-CM | POA: Diagnosis not present

## 2024-11-05 DIAGNOSIS — I639 Cerebral infarction, unspecified: Secondary | ICD-10-CM

## 2024-11-05 DIAGNOSIS — I4891 Unspecified atrial fibrillation: Secondary | ICD-10-CM | POA: Diagnosis not present

## 2024-11-05 DIAGNOSIS — I480 Paroxysmal atrial fibrillation: Secondary | ICD-10-CM | POA: Diagnosis not present

## 2024-11-05 LAB — RENAL FUNCTION PANEL
Albumin: 3.4 g/dL — ABNORMAL LOW (ref 3.5–5.0)
Anion gap: 10 (ref 5–15)
BUN: 26 mg/dL — ABNORMAL HIGH (ref 8–23)
CO2: 24 mmol/L (ref 22–32)
Calcium: 9.1 mg/dL (ref 8.9–10.3)
Chloride: 106 mmol/L (ref 98–111)
Creatinine, Ser: 1.97 mg/dL — ABNORMAL HIGH (ref 0.61–1.24)
GFR, Estimated: 38 mL/min — ABNORMAL LOW
Glucose, Bld: 83 mg/dL (ref 70–99)
Phosphorus: 3.4 mg/dL (ref 2.5–4.6)
Potassium: 4.5 mmol/L (ref 3.5–5.1)
Sodium: 140 mmol/L (ref 135–145)

## 2024-11-05 LAB — CBC
HCT: 36.5 % — ABNORMAL LOW (ref 39.0–52.0)
Hemoglobin: 11.9 g/dL — ABNORMAL LOW (ref 13.0–17.0)
MCH: 28.1 pg (ref 26.0–34.0)
MCHC: 32.6 g/dL (ref 30.0–36.0)
MCV: 86.3 fL (ref 80.0–100.0)
Platelets: 299 10*3/uL (ref 150–400)
RBC: 4.23 MIL/uL (ref 4.22–5.81)
RDW: 15.8 % — ABNORMAL HIGH (ref 11.5–15.5)
WBC: 9.2 10*3/uL (ref 4.0–10.5)
nRBC: 0 % (ref 0.0–0.2)

## 2024-11-05 LAB — BASIC METABOLIC PANEL WITH GFR
Anion gap: 13 (ref 5–15)
BUN: 27 mg/dL — ABNORMAL HIGH (ref 8–23)
CO2: 22 mmol/L (ref 22–32)
Calcium: 9.1 mg/dL (ref 8.9–10.3)
Chloride: 104 mmol/L (ref 98–111)
Creatinine, Ser: 1.8 mg/dL — ABNORMAL HIGH (ref 0.61–1.24)
GFR, Estimated: 42 mL/min — ABNORMAL LOW
Glucose, Bld: 87 mg/dL (ref 70–99)
Potassium: 4.5 mmol/L (ref 3.5–5.1)
Sodium: 139 mmol/L (ref 135–145)

## 2024-11-05 MED ORDER — FUROSEMIDE 40 MG PO TABS
60.0000 mg | ORAL_TABLET | Freq: Every day | ORAL | Status: DC
  Administered 2024-11-05: 60 mg via ORAL
  Filled 2024-11-05: qty 1

## 2024-11-05 MED ORDER — CARVEDILOL 6.25 MG PO TABS: 6.2500 mg | ORAL_TABLET | Freq: Two times a day (BID) | ORAL | 5 refills | Status: DC

## 2024-11-05 MED ORDER — HYDRALAZINE HCL 100 MG PO TABS: 100.0000 mg | ORAL_TABLET | Freq: Three times a day (TID) | ORAL | 3 refills | Status: AC

## 2024-11-05 MED ORDER — APIXABAN 5 MG PO TABS: 5.0000 mg | ORAL_TABLET | Freq: Two times a day (BID) | ORAL | 11 refills | Status: AC

## 2024-11-05 MED ORDER — FUROSEMIDE 20 MG PO TABS: 60.0000 mg | ORAL_TABLET | Freq: Every day | ORAL | 5 refills | Status: DC

## 2024-11-05 MED ORDER — ATORVASTATIN CALCIUM 80 MG PO TABS: 80.0000 mg | ORAL_TABLET | Freq: Every day | ORAL | 3 refills | Status: AC

## 2024-11-05 MED ORDER — ISOSORBIDE MONONITRATE ER 30 MG PO TB24: 30.0000 mg | ORAL_TABLET | Freq: Every day | ORAL | 3 refills | Status: AC

## 2024-11-05 NOTE — Progress Notes (Signed)
 1.Acute HFrEF - Jan 2023 echo: LVEF 60-65% - Jan 2026 Echo: LVEF 20-25%, mild LVH, indet diastolic, mild RV dysfunction, severe LAE, mild MR,  - CT trace right pleural effusion, proBNP 10,395   -received IV lasix  60mg  x 1, incomplete I/Os. By weights down 4 lbs since admit. Downtrend in Cr with diuresis consistent with HF and venous congestion - transition to oral lasix  60mg  daily   - medical therapy limited by AKI. Avoid ACE/ARB/ARNI/MRA. Consider SGLT2i closer to d/c, GFR 33 ok for use.  - on coreg  3.125mg  bid, hydralazine  75 mg tid, imdur  30mg  daily.  - Very low EF, cautious on titration of coreg  over time - AKI would prohibit cath at this time for ischemic CM evaluation - certaintly given his degree of HTN would consider HTN cardiomyopathy. - new diagnosis of afib, rates have not been significantly elevated here, I think tachy mediated CM less likely.    - with Cr no plans for inpatient cath.Reassess as outpatient - ok for discharge.      2.Afib - new diagnosis this admission - on coreg  for rate control - eliquis  for stroke prevention     3. AKI - Cr 1.97 on presentation, baseline 1-1.2 - could be a degree of venous congestion related to HF, mild downtrend in Cr with diuresis thus far, conitnue to monitor       4. HTN - severe HTN on presentation, prior admits similar - medical therapy limited by AKI - only got 2 doses of his recently increased hydralazine  yesterday, follow with 75mg  tid today.  - may also see some improvement with ongoing diuresis.    4. NSVT -isolated 12 beat episode - keep K >4, Mg >2. COntinue beta blocker.   OK for discharge from cardiac standpoint, we will sign off inpatient care. He already has f/u arranged Jan 28   Dorn Ross MD

## 2024-11-05 NOTE — Discharge Summary (Signed)
 "                                                                                   Scott Ritter, is a 64 y.o. male  DOB 1961-01-05  MRN [id number].  Admission date:  11/01/2024  Admitting Physician  Terry LOISE Hurst, DO  Discharge Date:  11/05/2024   Primary MD  Edman Meade PEDLAR, FNP  Recommendations for primary care physician for things to follow:  1)Watch for bleeding while on Blood Thinners--watch for blood in your stool which can make your stool black, maroon, mahogany or red---, blood in your urine which can make your urine pink or red, nosebleeds , also watch for possible bruising -You are taking Apixaban /Eliquis --- which is a blood thinner--- be careful to avoid injury or falls  2)Very Low-salt diet advised---Less than 2 gm of Sodium per day advised----ok to use Mrs DASH salt substitute instead of Salt 3)Weigh yourself daily, call if you gain more than 3 pounds in 1 day or more than 5 pounds in 1 week as your diuretic medications may need to be adjusted  4)Avoid ibuprofen /Advil /Aleve/Motrin Josefine Powders/Naproxen/BC powders/Meloxicam/Diclofenac/Indomethacin and other Nonsteroidal anti-inflammatory medications as these will make you more likely to bleed and can cause stomach ulcers, can also cause Kidney problems.   5)Repeat CBC and BMP blood tests in about 5 to 7 days  Admission Diagnosis  Atrial fibrillation (HCC) [I48.91] Hypokalemia [E87.6] Acute kidney injury [N17.9] Atrial fibrillation, unspecified type [facility]) [I48.91]   Discharge Diagnosis  Atrial fibrillation (HCC) [I48.91] Hypokalemia [E87.6] Acute kidney injury [N17.9] Atrial fibrillation, unspecified type (HCC) [I48.91]  ***  Principal Problem:   Atrial fibrillation (HCC) Active Problems:   Acute heart failure with reduced ejection fraction (HFrEF) /acute systolic dysfunction CHF   New onset a-fib (HCC)   AKI (acute kidney injury)   History of Stroke      Past Medical History:  Diagnosis Date   GERD  (gastroesophageal reflux disease)    Hypertension    Stroke (HCC)    left side deficits    Past Surgical History:  Procedure Laterality Date   BACK SURGERY     BIOPSY  09/17/2018   Procedure: BIOPSY;  Surgeon: Harvey Margo CROME, MD;  [facility];  Service: Endoscopy;;  duodenal biopsy    COLONOSCOPY N/A 02/29/2016   Dr. Harvey: redundant colon, non-bleeding internal hemorrhoids    COLONOSCOPY WITH PROPOFOL  N/A 04/09/2021   Procedure: COLONOSCOPY WITH PROPOFOL ;  Surgeon: Cindie Carlin POUR, DO;  Location[facility];  Service: Endoscopy;  Laterality: N/A;  7:30am   ESOPHAGOGASTRODUODENOSCOPY N/A 12/17/2016   Dr. harvey: 3 mm nonbleeding Mallory-Weiss tear.  EGD performed for hematemesis.  Hemoglobin normal.   ESOPHAGOGASTRODUODENOSCOPY N/A 02/19/2018   Dr. Shaaron: Performed for melena, hemoglobin of 6.  Mucosal changes in the esophagus query short segment Barrett's, biopsy more consistent with reflux changes.  Erosive gastropathy but no H. pylori.  Duodenal erosions.  Suspected NSAID induced injury.   ESOPHAGOGASTRODUODENOSCOPY  08/18/2018   Dr. Golda: IDA/heme positive stool.  Esophageal mucosal changes consistent with short segment Barrett's esophagus, not biopsied.  2 cm hiatal hernia.  Duodenal bulb, second portion of duodenum, third  portion of the duodenum.  Video capsule somewhat difficult to pass through the oropharynx but was eventually advanced into the second portion of the duodenum and released.   ESOPHAGOGASTRODUODENOSCOPY N/A 08/18/2018   Procedure: ESOPHAGOGASTRODUODENOSCOPY (EGD);  Surgeon: Golda Claudis PENNER, MD;  [facility];  Service: Endoscopy;  Laterality: N/A;   ESOPHAGOGASTRODUODENOSCOPY N/A 09/17/2018   gastritis, 2 nonbleeding angiectasia's in the duodenum were treated with [facility] coagulation status post biopsy.   FOOT SURGERY Left    GIVENS CAPSULE STUDY N/A 08/18/2018   Procedure: GIVENS CAPSULE STUDY;  Surgeon: Golda Claudis PENNER, MD;  Location:  [facility];  Service: Endoscopy;  Laterality: N/A;   HERNIA REPAIR     POLYPECTOMY  04/09/2021   Procedure: POLYPECTOMY;  Surgeon: Cindie Carlin POUR, DO;  Location: [facility];  Service: Endoscopy;;  descending; cecal       HPI  from the history and physical done on the day of admission:     ***  ****     Hospital Course:     No notes on file  ***** Assessment and Plan: No notes have been filed under this hospital service. Service: Hospitalist       Discharge Condition: ***  Follow UP   Follow-up Information     Bacchus, Meade PEDLAR, FNP. Schedule an appointment as soon as possible for a visit in 5 day(s).   Specialty: Family Medicine Contact information: [address] [place] [address] [phone]                  Consults obtained - ***  Diet and Activity recommendation:  As advised  Discharge Instructions    **** Discharge Instructions     Call MD for:  difficulty breathing, headache or visual disturbances   Complete by: As directed    Call MD for:  persistant dizziness or light-headedness   Complete by: As directed    Call MD for:  persistant nausea and vomiting   Complete by: As directed    Call MD for:  temperature >100.4   Complete by: As directed    Diet - low sodium heart healthy   Complete by: As directed    Discharge instructions   Complete by: As directed    1)Watch for bleeding while on Blood Thinners--watch for blood in your stool which can make your stool black, maroon, mahogany or red---, blood in your urine which can make your urine pink or red, nosebleeds , also watch for possible bruising -You are taking Apixaban /Eliquis --- which is a blood thinner--- be careful to avoid injury or falls  2)Very Low-salt diet advised---Less than 2 gm of Sodium per day advised----ok to use Mrs DASH salt substitute instead of Salt 3)Weigh yourself daily, call if you gain more than 3 pounds in 1 day or more than 5 pounds in 1  week as your diuretic medications may need to be adjusted  4)Avoid ibuprofen /Advil /Aleve/Motrin /Goody Powders/Naproxen/BC powders/Meloxicam/Diclofenac/Indomethacin and other Nonsteroidal anti-inflammatory medications as these will make you more likely to bleed and can cause stomach ulcers, can also cause Kidney problems.   5)Repeat CBC and BMP blood tests in about 5 to 7 days   Increase activity slowly   Complete by: As directed          Discharge Medications     Allergies as of 11/05/2024   No Known Allergies      Medication List     STOP taking these medications    losartan -hydrochlorothiazide 100-25 MG  tablet Commonly known as: HYZAAR   metoprolol  tartrate 25 MG tablet Commonly known as: LOPRESSOR    potassium chloride  SA 20 MEQ tablet Commonly known as: KLOR-CON  M       TAKE these medications    acetaminophen  500 MG tablet Commonly known as: TYLENOL  Take 500 mg by mouth every 6 (six) hours as needed for moderate pain (pain score 4-6).   apixaban  5 MG Tabs tablet Commonly known as: Eliquis  Take 1 tablet (5 mg total) by mouth 2 (two) times daily.   atorvastatin  80 MG tablet Commonly known as: LIPITOR Take 1 tablet (80 mg total) by mouth daily.   carvedilol  6.25 MG tablet Commonly known as: COREG  Take 1 tablet (6.25 mg total) by mouth 2 (two) times daily with a meal.   ezetimibe  10 MG tablet Commonly known as: Zetia  Take 1 tablet (10 mg total) by mouth daily.   furosemide  20 MG tablet Commonly known as: LASIX  Take 3 tablets (60 mg total) by mouth daily. Start taking on: November 06, 2024   gabapentin  300 MG capsule Commonly known as: NEURONTIN  Take 1 capsule (300 mg total) by mouth 2 (two) times daily.   hydrALAZINE  100 MG tablet Commonly known as: APRESOLINE  Take 1 tablet (100 mg total) by mouth 3 (three) times daily.   isosorbide  mononitrate 30 MG 24 hr tablet Commonly known as: IMDUR  Take 1 tablet (30 mg total) by mouth daily. Start taking  on: November 06, 2024   Vitamin D  50 MCG (2000 UT) Caps Take 2,000 Units by mouth daily.        Major procedures and Radiology Reports - PLEASE review detailed and final reports for all details, in brief -   ***  ECHOCARDIOGRAM COMPLETE Result Date: 11/02/2024    ECHOCARDIOGRAM REPORT   Patient Name:   KEROLOS NEHME Date of Exam: 11/02/2024 Medical Rec #:  [id number]          Height:       66.9 in Accession #:    [id number]         Weight:       183.6 lb Date of Birth:  09-27-1961          BSA:          1.949 m Patient Age:    63 years           BP:           153/100 mmHg Patient Gender: M                  HR:           73 bpm. Exam Location:  [facility] Procedure: 2D Echo, 3D Echo, Cardiac Doppler, Color Doppler and Strain Analysis            (Both Spectral and Color Flow Doppler were utilized during            procedure). Indications:    Atrial Fibrillation I48.91  History:        Patient has prior history of Echocardiogram examinations, most                 recent 10/23/2021. Risk Factors:Hypertension and Dyslipidemia.  Sonographer:    Aida Pizza RCS Referring Phys: [id number] CAROLE N HALL IMPRESSIONS  1. Left ventricular ejection fraction, by estimation, is 20 to 25%. The left ventricle has severely decreased function. The left ventricle demonstrates global hypokinesis. There is mild left ventricular hypertrophy. Left ventricular diastolic parameters  are indeterminate.  The average left ventricular global longitudinal strain is -4.8 %. The global longitudinal strain is abnormal.  2. Right ventricular systolic function is mildly reduced. The right ventricular size is normal. There is moderately elevated pulmonary artery systolic pressure.  3. Left atrial size was severely dilated.  4. Right atrial size was mildly dilated.  5. The mitral valve is abnormal. Mild mitral valve regurgitation. No evidence of mitral stenosis.  6. The tricuspid valve is abnormal.  7. The aortic valve is tricuspid. Aortic  valve regurgitation is not visualized. No aortic stenosis is present.  8. The inferior vena cava is dilated in size with <50% respiratory variability, suggesting right atrial pressure of 15 mmHg. FINDINGS  Left Ventricle: Left ventricular ejection fraction, by estimation, is 20 to 25%. The left ventricle has severely decreased function. The left ventricle demonstrates global hypokinesis. The average left ventricular global longitudinal strain is -4.8 %. Strain was performed and the global longitudinal strain is abnormal. The left ventricular internal cavity size was normal in size. There is mild left ventricular hypertrophy. Left ventricular diastolic parameters are indeterminate. Right Ventricle: The right ventricular size is normal. Right vetricular wall thickness was not well visualized. Right ventricular systolic function is mildly reduced. There is moderately elevated pulmonary artery systolic pressure. The tricuspid regurgitant velocity is 3.22 m/s, and with an assumed right atrial pressure of 15 mmHg, the estimated right ventricular systolic pressure is 56.5 mmHg. Left Atrium: Left atrial size was severely dilated. Right Atrium: Right atrial size was mildly dilated. Pericardium: There is no evidence of pericardial effusion. Mitral Valve: The mitral valve is abnormal. Mild mitral valve regurgitation. No evidence of mitral valve stenosis. Tricuspid Valve: The tricuspid valve is abnormal. Tricuspid valve regurgitation is mild . No evidence of tricuspid stenosis. Aortic Valve: The aortic valve is tricuspid. Aortic valve regurgitation is not visualized. No aortic stenosis is present. Aortic valve mean gradient measures 2.0 mmHg. Aortic valve peak gradient measures 3.7 mmHg. Aortic valve area, by VTI measures 1.61 cm. Pulmonic Valve: The pulmonic valve was not well visualized. Pulmonic valve regurgitation is mild to moderate. No evidence of pulmonic stenosis. Aorta: The aortic root is normal in size and structure  and the ascending aorta was not well visualized. Venous: The inferior vena cava is dilated in size with less than 50% respiratory variability, suggesting right atrial pressure of 15 mmHg. IAS/Shunts: No atrial level shunt detected by color flow Doppler.  LEFT VENTRICLE PLAX 2D LVIDd:         5.60 cm LVIDs:         4.70 cm      2D Longitudinal Strain LV PW:         1.10 cm      2D Strain GLS Avg:     -4.8 % LV IVS:        1.00 cm LVOT diam:     2.10 cm LV SV:         29 LV SV Index:   15           3D Volume EF: LVOT Area:     3.46 cm     3D EF:        35 %                             LV EDV:       190 ml  LV ESV:       122 ml LV Volumes (MOD)            LV SV:        67 ml LV vol d, MOD A2C: 147.0 ml LV vol d, MOD A4C: 188.0 ml LV vol s, MOD A2C: 101.0 ml LV vol s, MOD A4C: 116.0 ml LV SV MOD A2C:     46.0 ml LV SV MOD A4C:     188.0 ml LV SV MOD BP:      60.6 ml RIGHT VENTRICLE RV S prime:     7.46 cm/s TAPSE (M-mode): 1.3 cm LEFT ATRIUM              Index        RIGHT ATRIUM           Index LA diam:        5.00 cm  2.57 cm/m   RA Area:     22.50 cm LA Vol (A2C):   177.0 ml 90.84 ml/m  RA Volume:   71.10 ml  36.49 ml/m LA Vol (A4C):   137.0 ml 70.31 ml/m LA Biplane Vol: 156.0 ml 80.06 ml/m  AORTIC VALVE AV Area (Vmax):    1.67 cm AV Area (Vmean):   1.65 cm AV Area (VTI):     1.61 cm AV Vmax:           96.63 cm/s AV Vmean:          65.033 cm/s AV VTI:            0.179 m AV Peak Grad:      3.7 mmHg AV Mean Grad:      2.0 mmHg LVOT Vmax:         46.59 cm/s LVOT Vmean:        31.033 cm/s LVOT VTI:          0.083 m LVOT/AV VTI ratio: 0.46  AORTA Ao Root diam: 3.30 cm MITRAL VALVE                TRICUSPID VALVE MV Area (PHT): 5.02 cm     TR Peak grad:   41.5 mmHg MV Decel Time: 151 msec     TR Vmax:        322.00 cm/s MR Peak grad: 111.1 mmHg MR Mean grad: 67.0 mmHg     SHUNTS MR Vmax:      527.00 cm/s   Systemic VTI:  0.08 m MR Vmean:     378.0 cm/s    Systemic Diam: 2.10 cm MV E  velocity: 108.00 cm/s Dorn Ross MD Electronically signed by Dorn Ross MD Signature Date/Time: 11/02/2024/3:48:18 PM    Final    CT ABDOMEN PELVIS W CONTRAST Result Date: 11/01/2024 EXAM: CT ABDOMEN AND PELVIS WITH CONTRAST 11/01/2024 07:58:39 PM TECHNIQUE: CT of the abdomen and pelvis was performed with the administration of intravenous contrast. Multiplanar reformatted images are provided for review. Automated exposure control, iterative reconstruction, and/or weight-based adjustment of the mA/kV was utilized to reduce the radiation dose to as low as reasonably achievable. COMPARISON: None available. CLINICAL HISTORY: FINDINGS: LOWER CHEST: Trace right pleural effusion. LIVER: The liver is unremarkable. GALLBLADDER AND BILE DUCTS: Multiple small gallstones without associated inflammatory changes. No biliary ductal dilatation. SPLEEN: No acute abnormality. PANCREAS: No acute abnormality. ADRENAL GLANDS: No acute abnormality. KIDNEYS, URETERS AND BLADDER: Subcentimeter bilateral renal cysts, benign (Bosniak 1). No stones in the kidneys or ureters. No hydronephrosis. No perinephric or periureteral  stranding. Urinary bladder is unremarkable. GI AND BOWEL: Normal appendix (image 48). Stomach demonstrates no acute abnormality. There is no bowel obstruction. PERITONEUM AND RETROPERITONEUM: No ascites. No free air. Small fat containing umbilical hernia. VASCULATURE: Moderate atherosclerotic calcification of the abdominal aorta without aneurysm. LYMPH NODES: No lymphadenopathy. REPRODUCTIVE ORGANS: Prostate is unremarkable. BONES AND SOFT TISSUES: No acute osseous abnormality. Tiny fat-containing bilateral inguinal hernias. IMPRESSION: 1. No acute abdominopelvic abnormality. 2. Cholelithiasis, without CT evidence of acute cholecystitis. 3. Aortic Atherosclerosis (ICD10-I70.0). Electronically signed by: Pinkie Pebbles MD 11/01/2024 08:07 PM EST RP Workstation: [id number]   CT Angio Chest PE W/Cm &/Or Wo  Cm Result Date: 11/01/2024 EXAM: CTA of the Chest with contrast for PE 11/01/2024 07:58:39 PM TECHNIQUE: CTA of the chest was performed after the administration of intravenous contrast. Multiplanar reformatted images are provided for review. MIP images are provided for review. Automated exposure control, iterative reconstruction, and/or weight based adjustment of the mA/kV was utilized to reduce the radiation dose to as low as reasonably achievable. COMPARISON: None available. CLINICAL HISTORY: SOB, right sided pain, lethargy FINDINGS: PULMONARY ARTERIES: Pulmonary arteries are adequately opacified for evaluation. No pulmonary embolism. Main pulmonary artery is normal in caliber. MEDIASTINUM: The heart and pericardium demonstrate no acute abnormality. Mild thoracic aortic atherosclerosis. There is no acute abnormality of the thoracic aorta. LYMPH NODES: No mediastinal, hilar or axillary lymphadenopathy. LUNGS AND PLEURA: Mild subpleural scarring/atelectasis in the posterior right upper lobe and bilateral lower lobes. Trace right pleural effusion. No pneumothorax. UPPER ABDOMEN: Limited images of the upper abdomen are unremarkable. SOFT TISSUES AND BONES: No acute bone or soft tissue abnormality. IMPRESSION: 1. No pulmonary embolism. 2. Trace right pleural effusion. 3. Aortic Atherosclerosis (ICD10-I70.0). Electronically signed by: Pinkie Pebbles MD 11/01/2024 08:05 PM EST RP Workstation: [id number]    Micro Results   *** No results found for this or any previous visit (from the past 240 hours).  Today   Subjective    Priest Lockridge today has no ***          Patient has been seen and examined prior to discharge   Objective   Blood pressure (!) 155/104, pulse 94, temperature 98.2 F (36.8 C), temperature source Oral, resp. rate 18, height 5' 7 (1.702 m), weight 82.1 kg, SpO2 96%.   Intake/Output Summary (Last 24 hours) at 11/05/2024 1215 Last data filed at 11/05/2024 0605 Gross per 24 hour   Intake --  Output 1150 ml  Net -1150 ml    Exam Gen:- Awake Alert, no acute distress *** HEENT:- [place].AT, No sclera icterus Neck-Supple Neck,No JVD,.  Lungs-  CTAB , good air movement bilaterally CV- S1, S2 normal, regular Abd-  +ve B.Sounds, Abd Soft, No tenderness,    Extremity/Skin:- No  edema,   good pulses Psych-affect is appropriate, oriented x3 Neuro-no new focal deficits, no tremors ***   Data Review   CBC w Diff:  Lab Results  Component Value Date   WBC 9.2 11/05/2024   HGB 11.9 (L) 11/05/2024   HGB 13.9 01/25/2024   HCT 36.5 (L) 11/05/2024   HCT 42.5 01/25/2024   PLT 299 11/05/2024   PLT 336 01/25/2024   LYMPHOPCT 19 11/02/2024   MONOPCT 7 11/02/2024   EOSPCT 1 11/02/2024   BASOPCT 1 11/02/2024    CMP:  Lab Results  Component Value Date   NA 140 11/05/2024   NA 137 01/25/2024   K 4.5 11/05/2024   CL 106 11/05/2024   CO2 24 11/05/2024   BUN 26 (  H) 11/05/2024   BUN 13 01/25/2024   CREATININE 1.97 (H) 11/05/2024   CREATININE 1.11 04/03/2021   PROT 6.7 11/01/2024   PROT 7.4 01/25/2024   ALBUMIN 3.4 (L) 11/05/2024   ALBUMIN 4.2 01/25/2024   BILITOT 1.1 11/01/2024   BILITOT 0.4 01/25/2024   ALKPHOS 105 11/01/2024   AST 32 11/01/2024   ALT 25 11/01/2024  .  Total Discharge time is about 33 minutes  Rendall Carwin M.D on 11/05/2024 at 12:15 PM  Go to [email or web address] -  for contact info  [facility] - Office  [phone]   "

## 2024-11-07 ENCOUNTER — Telehealth: Payer: Self-pay

## 2024-11-07 NOTE — Transitions of Care (Post Inpatient/ED Visit) (Signed)
 Today's TOC FU Call Status: Today's TOC FU Call Status:: Successful TOC FU Call Completed TOC FU Call Complete Date: 11/07/24  Patient's Name and Date of Birth confirmed. Name, DOB  Transition Care Management Follow-up Telephone Call Date of Discharge: 11/05/24 [facility] Type of Discharge: Inpatient Admission Primary Inpatient Discharge Diagnosis:: Atrial fibrillation How have you been since you were released from the hospital?: Better Any questions or concerns?: Yes Patient Questions/Concerns:: Patient had questtion after patient reported brief lightheadness on Saturday but he rested and it went away and has not come back. Reviewed what to call providers for, possible causes of lightheadeness including blood pressure, atrial fib, potential dehydrations, new medications, and encouraged patient to report persistent lightheadeness/dizziness, fall preacutions, bleeding precautions, start taking blood pressures daily with daily weights and record to take to providers. Reviewed AVS and discharge instructions with patient. Prior Left sided weakness from stroke 18 years go due to blood pressure per patient reported. Has scale and wrist blood pressure cuff. Patient Questions/Concerns Addressed: Other: (See above comments on patient questions/concerns.)  Items Reviewed: Did you receive and understand the discharge instructions provided?: Yes Medications obtained,verified, and reconciled?: Yes (Medications Reviewed) Dietary orders reviewed?: No Do you have support at home?: No  Medications Reviewed Today: Medications Reviewed Today     Reviewed by Carolee Heron NOVAK, RN (Case Manager) on 11/07/24 at 1331  Med List Status: <None>   Medication Order Taking? Sig Documenting Provider Last Dose Status Informant  acetaminophen  (TYLENOL ) 500 MG tablet [id number] Yes Take 500 mg by mouth every 6 (six) hours as needed for moderate pain (pain score 4-6). [provider]   Active Self, Pharmacy Records  apixaban  (ELIQUIS ) 5 MG TABS tablet [id number] Yes Take 1 tablet (5 mg total) by mouth 2 (two) times daily. Pearlean Manus, MD  Active   atorvastatin  (LIPITOR) 80 MG tablet [id number] Yes Take 1 tablet (80 mg total) by mouth daily. Pearlean Manus, MD  Active   carvedilol  (COREG ) 6.25 MG tablet [id number] Yes Take 1 tablet (6.25 mg total) by mouth 2 (two) times daily with a meal. Emokpae, Courage, MD  Active   Cholecalciferol  (VITAMIN D ) 2000 units CAPS [id number] Yes Take 2,000 Units by mouth daily. [provider]  Active Self, Pharmacy Records  ezetimibe  (ZETIA ) 10 MG tablet [id number] Yes Take 1 tablet (10 mg total) by mouth daily. Edman Meade PEDLAR, FNP  Active Self, Pharmacy Records  furosemide  (LASIX ) 20 MG tablet [id number] Yes Take 3 tablets (60 mg total) by mouth daily. Pearlean Manus, MD  Active   gabapentin  (NEURONTIN ) 300 MG capsule [id number] Yes Take 1 capsule (300 mg total) by mouth 2 (two) times daily. Edman Meade PEDLAR, FNP  Active Self, Pharmacy Records  hydrALAZINE  (APRESOLINE ) 100 MG tablet [id number] Yes Take 1 tablet (100 mg total) by mouth 3 (three) times daily. Pearlean Manus, MD  Active   isosorbide  mononitrate (IMDUR ) 30 MG 24 hr tablet [id number] Yes Take 1 tablet (30 mg total) by mouth daily. Pearlean Manus, MD  Active             Home Care and Equipment/Supplies: Were Home Health Services Ordered?: No Any new equipment or medical supplies ordered?: No  Functional Questionnaire: Do you need assistance with bathing/showering or dressing?: No Do you need assistance with meal preparation?: No Do you need assistance with eating?: No Do you have difficulty maintaining continence: No Do you need assistance with getting out of bed/getting out of a chair/moving?: No Do you have  difficulty managing or taking your medications?: No  Follow up appointments reviewed: PCP Follow-up appointment confirmed?: No (Patient to call today  to request HFU.) MD Provider Line [phone] Given: No Specialist Hospital Follow-up appointment confirmed?: Yes Date of Specialist follow-up appointment?: 11/09/24 Follow-Up Specialty Provider:: Laymon Qua at [facility] [place], [place] Do you need transportation to your follow-up appointment?: No Do you understand care options if your condition(s) worsen?: Yes-patient verbalized understanding (Also reviewed AVS with patient again.)  SDOH Interventions Today    Flowsheet Row Most Recent Value  SDOH Interventions   Food Insecurity Interventions Intervention Not Indicated  Housing Interventions Intervention Not Indicated  Transportation Interventions Intervention Not Indicated, Patient Resources (Friends/Family)  Utilities Interventions Intervention Not Indicated  Health Literacy Interventions Intervention Not Indicated    Goals Addressed             This Visit's Progress    VBCI Transitions of Care (TOC) Care Plan       Problems:  Recent Hospitalization for treatment of Atrial Fibrillation and HTN Knowledge Deficit Related to atrial fibrillation, new medications, recent discharge instructions and No Hospital Follow Up Provider appointment was schedule during hospital discharge but patient will contact office for hospital follow up after rationale explained and has contact information.   Goal:  Over the next 30 days, the patient will not experience hospital readmission  Interventions:  Transitions of Care: Doctor Visits  - discussed the importance of doctor visits Troubleshoot use of DME in the home   AFIB Interventions:   Counseled on increased risk of stroke due to Afib and benefits of anticoagulation for stroke prevention Reviewed importance of adherence to anticoagulant exactly as prescribed Counseled on bleeding risk associated with new medication, a blood thinner,  and importance of self-monitoring for signs/symptoms of bleeding Counseled on avoidance of  NSAIDs due to increased bleeding risk with anticoagulants Counseled on importance of regular laboratory monitoring as prescribed Counseled on seeking medical attention after a head injury or if there is blood in the urine/stool Afib action plan reviewed Screening for signs and symptoms of depression related to chronic disease state  Assessed social determinant of health barriers  Hypertension Interventions:  Started on new medications on this discharge, reviewed AVS, medications, history of stroke, afib with blood thinner medications, new blood pressure medications, what to medication to stop.  Last practice recorded BP readings:  BP Readings from Last 3 Encounters:  11/05/24 (!) 155/104  05/30/24 (!) 140/70  05/25/24 (!) 175/91   Most recent eGFR/CrCl:  Lab Results  Component Value Date   EGFR 73 01/25/2024    No components found for: CRCL  Evaluation of current treatment plan related to hypertension self management and patient's adherence to plan as established by provider Provided education to patient re: stroke prevention, s/s of heart attack and stroke Reviewed medications with patient and discussed importance of compliance Counseled on adverse effects of illicit drug and excessive alcohol use in patients with high blood pressure  Discussed plans with patient for ongoing care management follow up and provided patient with direct contact information for care management team Advised patient, providing education and rationale, to monitor blood pressure daily and record, calling PCP for findings outside established parameters Reviewed scheduled/upcoming provider appointments including: Making PCP hospital follow up and requesting as such, follow up with cardiology provider on 11/09/24 with location confirmed with patient and transportation needs (none).  Provided education on prescribed diet very low salt, heart healthy diet, use Ms Hollis, which patient has before  and likes it.   Screening for signs and symptoms of depression related to chronic disease state  Assessed social determinant of health barriers: None noted.  Assessed for self care equipment: has wrist blood pressure cuff and scales for daily weights.   Patient Self Care Activities:  Attend all scheduled provider appointments Call pharmacy for medication refills 3-7 days in advance of running out of medications Reviewed bleeding/fall precautions with new blood thinner medication started at discharge.  Report any falls to provider no matter how minor, especially any falls where you hit your head.  Call provider office for new concerns or questions  Notify RN Care Manager of TOC call rescheduling needs Participate in Transition of Care Program/Attend TOC scheduled calls Perform all self care activities independently  Take medications as prescribed   Call office if I gain more than 2 pounds in one day or 5 pounds in one week or as instructed by provider on AVS/DC Summary or follow up visits post discharge.  Do ankle pumps when sitting keep legs up while sitting track weight in diary watch for swelling in feet, ankles and legs every day weigh myself daily know when to call the doctor: As reviewed in After Visit Summary/Discharge instructions Very low salt, heart healthy diet, may use Ms Hollis substitute.  begin a symptom diary bring symptom diary to all appointments Learn or ask PCP or cardiologist to teach how to check pulse (heart) rate before taking medicine make a plan to eat healthy keep all lab appointments especially while on blood thinners.  take medicine as prescribed and report any concerning side effects.  Learn or ask PCP office to help teach how to take a pulse with LEFT arm weakness present to determine high or low heart rate, regular or irregular rythmn.  Blood Pressure Record Sheet To take your blood pressure, you will need a blood pressure machine. You may be prescribed one, or you can  buy a blood pressure machine (blood pressure monitor) at your clinic, drug store, or online. When choosing one, look for these features: An automatic monitor that has an arm cuff. A cuff that wraps snugly, but not too tightly, around your upper arm. You should be able to fit only one finger between your arm and the cuff. A device that stores blood pressure reading results. Follow your health care provider's instructions for how to take your blood pressure.  To use this form: Get one reading in the morning (a.m.) before you take any medicines. Get one reading in the evening (p.m.) before supper. Take at least two readings with each blood pressure check. This makes sure the results are correct. Wait 1-2 minutes between measurements. Write down the results in the spaces on this form. Repeat this once a week, or as told by your health care provider. Make a follow-up appointment with your health care provider to discuss the results. Blood pressure log Date: _______________________ a.m. _____________________(1st reading) _____________________(2nd reading) p.m. _____________________(1st reading) _____________________(2nd reading) Date: _______________________ a.m. _____________________(1st reading) _____________________(2nd reading) p.m. _____________________(1st reading) _____________________(2nd reading) Date: _______________________ a.m. _____________________(1st reading) _____________________(2nd reading) p.m. _____________________(1st reading) _____________________(2nd reading) Date: _______________________ a.m. _____________________(1st reading) _____________________(2nd reading) p.m. _____________________(1st reading) _____________________(2nd reading) Date: _______________________ a.m. _____________________(1st reading) _____________________(2nd reading) p.m. _____________________(1st reading) _____________________(2nd reading) This information is not intended to replace advice given to  you by your health care provider. Make sure you discuss any questions you have with your health care provider. Document Revised: 06/13/2021 Document Reviewed: 06/13/2021 Elsevier Patient Education  2024 Elsevier  Inc.  Plan:  An initial telephone outreach has been scheduled for: Next week 11/15/2024 around 2 pm.  The patient will call PCP as advised with rationale given, to schedule hospital follow up or HFU.         11/07/2024: Successful outreach by [facility] RN CM for post discharge follow up call with patient by telephone call. Patient verbally agreed to follow up calls.   The patient has been provided with contact information for the care management team and has been advised to call with any health-related questions or concerns but not urgent or emergency needs.  The patient verbalized understanding with current POC and goals discussed.  The patient is directed to their insurance card regarding availability of benefits coverage    TOC follow-up call completed with patient for the information on this call. Patient/family reached and identity verified. Patient/family or informant for call reports clinical status since discharge as stable or improving unless otherwise noted.  Denies unplanned ED visits, hospital readmission, or falls since discharge.  Concerns reviewed during call. Issues previously addressed by inpatient or outpatient providers were acknowledged and verified with patient/family.  No new or unresolved care management needs identified at this time other than what is documented here or ongoing plan of care  NO escalation to higher level of care is noted or need based on patient reporting on this call.   Medication list reviewed in full. Patient confirms adherence to prescribed regimen except as otherwise documented. Focused review completed on new, changed, and high-risk medications as applicable.  Patient/family/informant for call denies access barriers, adverse effects, or  medication confusion.  Follow-up appointments reviewed. Patient/family or informant on this call is aware of recommended follow-up plan and reports ability to attend.  No transportation or scheduling barriers identified unless otherwise noted.  Self-care instructions and diagnosis-specific red flags reviewed and documented if any assessed or noted. None noted on this call.  Patient/family/informant verbalized understanding via teach-back and agrees to plan of care.  Patient/family/informant has TOC RN CM contact number in case of questions but not for Urgent or Emergency care needs.  Patient/family/informant noted on this call was advised to contact PCP or seek urgent/emergency 911 care if symptoms worsen.    An initial telephone outreach has been scheduled for: Next week 11/15/2024 around 2 pm.     Bing Edison MSN, RN RN Case Manager [facility]  VBCI-Population Health Office Hours M-F [phone] Direct Dial: [phone] Main Phone [phone]  Fax: [phone] [email or web address]

## 2024-11-09 ENCOUNTER — Ambulatory Visit: Attending: Student | Admitting: Student

## 2024-11-09 ENCOUNTER — Encounter: Payer: Self-pay | Admitting: Student

## 2024-11-09 VITALS — BP 122/64 | HR 61 | Ht 67.0 in | Wt 184.6 lb

## 2024-11-09 DIAGNOSIS — I10 Essential (primary) hypertension: Secondary | ICD-10-CM | POA: Diagnosis not present

## 2024-11-09 DIAGNOSIS — Z79899 Other long term (current) drug therapy: Secondary | ICD-10-CM

## 2024-11-09 DIAGNOSIS — I480 Paroxysmal atrial fibrillation: Secondary | ICD-10-CM | POA: Diagnosis not present

## 2024-11-09 DIAGNOSIS — N179 Acute kidney failure, unspecified: Secondary | ICD-10-CM

## 2024-11-09 DIAGNOSIS — I5021 Acute systolic (congestive) heart failure: Secondary | ICD-10-CM

## 2024-11-09 NOTE — Patient Instructions (Signed)
 Medication Instructions:  Your physician recommends that you continue on your current medications as directed. Please refer to the Current Medication list given to you today.  *If you need a refill on your cardiac medications before your next appointment, please call your pharmacy*  Lab Work: Your physician recommends that you return for lab work in: BMET   If you have labs (blood work) drawn today and your tests are completely normal, you will receive your results only by: MyChart Message (if you have MyChart) OR A paper copy in the mail If you have any lab test that is abnormal or we need to change your treatment, we will call you to review the results.  Testing/Procedures: Your physician has requested that you have an echocardiogram. Echocardiography is a painless test that uses sound waves to create images of your heart. It provides your doctor with information about the size and shape of your heart and how well your hearts chambers and valves are working. This procedure takes approximately one hour. There are no restrictions for this procedure. Please do NOT wear cologne, perfume, aftershave, or lotions (deodorant is allowed). Please arrive 15 minutes prior to your appointment time.  Please note: We ask at that you not bring children with you during ultrasound (echo/ vascular) testing. Due to room size and safety concerns, children are not allowed in the ultrasound rooms during exams. Our front office staff cannot provide observation of children in our lobby area while testing is being conducted. An adult accompanying a patient to their appointment will only be allowed in the ultrasound room at the discretion of the ultrasound technician under special circumstances. We apologize for any inconvenience.   Follow-Up: At [facility], you and your health needs are our priority.  As part of our continuing mission to provide you with exceptional heart care, our providers are all part  of one team.  This team includes your primary Cardiologist (physician) and Advanced Practice Providers or APPs (Physician Assistants and Nurse Practitioners) who all work together to provide you with the care you need, when you need it.  Your next appointment:   2 month(s)  Provider:   Dorn Ross, MD    We recommend signing up for the patient portal called MyChart.  Sign up information is provided on this After Visit Summary.  MyChart is used to connect with patients for Virtual Visits (Telemedicine).  Patients are able to view lab/test results, encounter notes, upcoming appointments, etc.  Non-urgent messages can be sent to your provider as well.   To learn more about what you can do with MyChart, go to [email or web address].   Other Instructions Thank you for choosing [facility]!

## 2024-11-09 NOTE — Progress Notes (Unsigned)
 "  Cardiology Office Note    Date:  11/09/2024  ID:  Scott Ritter, DOB March 30, 1961, MRN [id number] Cardiologist: Alvan Carrier, MD { :  History of Present Illness:    Scott Ritter is a 64 y.o. male with past medical history of HTN, HLD, prior CVA and recently diagnosed atrial fibrillation and HFrEF who presents to the office today for hospital follow-up.  He was recently admitted to [facility] from 1/20 - 11/05/2024 for evaluation of worsening shortness of breath and flank pain.  He was found to be in atrial fibrillation but rates were mostly well-controlled. He was initially started on IV Heparin  for anticoagulation. Echocardiogram showed his EF was reduced at 20 to 25% and his cardiomyopathy was felt to be due to uncontrolled hypertension as compared to a tachycardia-mediated cardiomyopathy given that his heart rate had overall been well-controlled. He did have an AKI with creatinine at 1.97 on admission which had peaked at 2.19 during admission. Further ischemic evaluation was not pursued given this. He responded well to IV Lasix  during admission and weight had declined to 181 lbs at discharge. He was transitioned to Lasix  60 mg daily while being continued on Coreg  6.25 mg twice daily, Hydralazine  100mg  3 times daily and Imdur  30 mg daily. Was started on Eliquis  for anticoagulation and was recommended to readdress his cardiomyopathy and ischemic evaluation as an outpatient.  In talking with the patient today, he reports overall doing well since his recent hospitalization. Reports his breathing has been back to baseline and he denies any dyspnea on exertion, orthopnea, PND or pitting edema. No recent chest pain or palpitations.  Reports good compliance with his medications thus far.  No reports of active bleeding since being on Eliquis . Reports frequent urination with Lasix . He is trying to limit the sodium intake in his food.   Studies Reviewed:   EKG: EKG is not ordered  today.  Echocardiogram: 10/2024 IMPRESSIONS     1. Left ventricular ejection fraction, by estimation, is 20 to 25%. The  left ventricle has severely decreased function. The left ventricle  demonstrates global hypokinesis. There is mild left ventricular  hypertrophy. Left ventricular diastolic parameters   are indeterminate. The average left ventricular global longitudinal  strain is -4.8 %. The global longitudinal strain is abnormal.   2. Right ventricular systolic function is mildly reduced. The right  ventricular size is normal. There is moderately elevated pulmonary artery  systolic pressure.   3. Left atrial size was severely dilated.   4. Right atrial size was mildly dilated.   5. The mitral valve is abnormal. Mild mitral valve regurgitation. No  evidence of mitral stenosis.   6. The tricuspid valve is abnormal.   7. The aortic valve is tricuspid. Aortic valve regurgitation is not  visualized. No aortic stenosis is present.   8. The inferior vena cava is dilated in size with <50% respiratory  variability, suggesting right atrial pressure of 15 mmHg.   Risk Assessment/Calculations:   CHA2DS2-VASc Score = 4  This indicates a 4.8% annual risk of stroke. The patient's score is based upon: CHF History: 1 HTN History: 1 Diabetes History: 0 Stroke History: 2 Vascular Disease History: 0 Age Score: 0 Gender Score: 0   Physical Exam:   VS:  BP 122/64 (BP Location: Right Arm, Cuff Size: Normal)   Pulse 61   Ht 5' 7 (1.702 m)   Wt 184 lb 9.6 oz (83.7 kg)   SpO2 95%   BMI 28.91 kg/m  Wt Readings from Last 3 Encounters:  11/09/24 184 lb 9.6 oz (83.7 kg)  11/05/24 181 lb (82.1 kg)  06/20/24 194 lb (88 kg)     GEN: Pleasant male appearing in no acute distress NECK: No JVD; No carotid bruits CARDIAC: Irregularly irregular, no murmurs, rubs, gallops RESPIRATORY:  Clear to auscultation without rales, wheezing or rhonchi  ABDOMEN: Appears non-distended. No obvious  abdominal masses. EXTREMITIES: No clubbing or cyanosis. No pitting edema.  Distal pedal pulses are 2+ bilaterally.   Assessment and Plan:   1. Acute HFrEF (heart failure with reduced ejection fraction) (HCC) - Diagnosed during his recent admission and EF was at 20 to 25% with global hypokinesis and mildly reduced RV function.  Cardiomyopathy felt to be due to uncontrolled hypertension as compared to a tachycardia mediated cardiomyopathy given his well-controlled heart rate. We reviewed options for ischemic evaluation and given that he overall feels well, he prefers to have a follow-up echocardiogram initially and then based off of that, can arrange for ischemic evaluation if EF remains reduced. Tentatively reviewed a possible cardiac catheterization if his cardiomyopathy persists and renal function improves. Will arrange for a limited echocardiogram in 2 months for reassessment of LV and RV function. - GDMT was overall limited given his AKI during admission and he was not started on an ACE-I/ARB/ARNI/MRA/SGLT2i due to this. Would recheck a BMET as outlined below. If renal function has improved, would plan to start Farxiga or Jardiance. For now, continue Coreg  6.25 mg twice daily, Lasix  60 mg daily, Hydralazine  100 mg TID and Imdur  30 mg daily.  2. PAF (paroxysmal atrial fibrillation) (HCC) - This was a new diagnosis for the patient during admission and rates were overall well-controlled. He was started on Coreg  6.25 mg twice daily to help with his heart rate and blood pressure. Heart rate is well-controlled in the 60's during today's visit. - Continue Eliquis  5 mg twice daily for anticoagulation which is the correct dose given his current age (64 years old), weight (180 lbs) and renal function (creatinine at 1.97 on most recent check).   3. Essential hypertension - BP is well-controlled at 122/64 during today's visit. Continue current medical therapy with Coreg  6.25 mg twice daily, Lasix  60 mg daily,  Hydralazine  100 mg 3 times daily and Imdur  30 mg daily.  4. AKI (acute kidney injury) - His creatinine peaked at 2.19 during admission and had improved to 1.97 at the time of hospital discharge. Recommended a repeat BMET today but he wants to hold off until he sees his PCP next week. Lab slip for BMET provided.    Signed, Laymon CHRISTELLA Qua, PA-C   "

## 2024-11-10 ENCOUNTER — Ambulatory Visit: Payer: Self-pay

## 2024-11-10 ENCOUNTER — Encounter: Payer: Self-pay | Admitting: Student

## 2024-11-10 ENCOUNTER — Ambulatory Visit

## 2024-11-10 VITALS — Temp 97.9°F | Ht 67.0 in | Wt 180.4 lb

## 2024-11-10 DIAGNOSIS — R001 Bradycardia, unspecified: Secondary | ICD-10-CM | POA: Diagnosis not present

## 2024-11-10 DIAGNOSIS — R42 Dizziness and giddiness: Secondary | ICD-10-CM

## 2024-11-10 DIAGNOSIS — I4891 Unspecified atrial fibrillation: Secondary | ICD-10-CM | POA: Diagnosis not present

## 2024-11-10 MED ORDER — CARVEDILOL 3.125 MG PO TABS: 3.1250 mg | ORAL_TABLET | Freq: Two times a day (BID) | ORAL | 3 refills | Status: AC

## 2024-11-10 NOTE — Progress Notes (Signed)
 "  Acute Office Visit  Subjective:     Patient ID: Scott Ritter, male    DOB: 1961/10/03, 64 y.o.   MRN: [id number]  Chief Complaint  Patient presents with   Follow-up    Follow up from hospital visit - past wknd Pt went in for heart problems and after taking prescribed meds he has been experiencing  Headaches, chills, dizziness, and weak feeling    HPI Discussed the use of AI scribe software for clinical note transcription with the patient, who gave verbal consent to proceed.  History of Present Illness Scott Ritter is a 64 year old male with atrial fibrillation and a history of stroke who presents with dizziness, headache, weakness and cold chills after starting Eliquis . Followed up with cardiology yesterday.   He began experiencing dizziness and cold chills after starting Eliquis  on November 01, 2024, following a hospital discharge for atrial fibrillation. The dizziness is described as 'real dizzy' and occurs after taking the medication, persisting for about a third of the day. He also experiences headaches, weakness, and fatigue, which he associates with the medication. He notes improvement in symptoms when not taking Eliquis . Says that he is certain it is coming from the medication, because he held his morning dose and his symptoms improved.   He has a history of stroke, resulting in left-sided deficits. No recent respiratory symptoms, fever, or urinary symptoms. He has not been in contact with anyone with the flu or other illnesses.  His current medications include Eliquis , taken twice daily since November 01, 2024, Coreg  prescribed by cardiology, and Lasix . Tylenol  has previously helped alleviate his headaches. During the review of symptoms, he denies current dizziness when standing up and reports no recent falls. His symptoms improve when he does not take Eliquis . Says that he was going to mention this at his hospital follow-up visit, but says the dizziness got worse last  night after seeing cardiology. He did not mention this at his appointment with cardiology.    Review of Systems  Constitutional:  Negative for fatigue.  Eyes:  Negative for visual disturbance.  Respiratory:  Negative for cough, chest tightness, shortness of breath and wheezing.   Cardiovascular:  Negative for chest pain, palpitations and leg swelling.  Genitourinary:  Negative for difficulty urinating.  Neurological:  Positive for dizziness, weakness and headaches. Negative for syncope, facial asymmetry, speech difficulty, light-headedness and numbness.  Psychiatric/Behavioral:  Positive for sleep disturbance.        Objective:    Today's Vitals   11/10/24 1007  Temp: 97.9 F (36.6 C)  SpO2: 98%  Weight: 180 lb 6.4 oz (81.8 kg)  Height: 5' 7 (1.702 m)   Orthostatic VS for the past 72 hrs (Last 3 readings):  Orthostatic BP Patient Position BP Location Orthostatic Pulse  11/10/24 1209 (!) 148/98 Sitting -- --  11/10/24 1030 (!) 152/96 Standing Right Arm --  11/10/24 1007 144/82 -- -- (!) 42    Body mass index is 28.25 kg/m.   Physical Exam Vitals and nursing note reviewed.  Constitutional:      General: He is not in acute distress.    Appearance: Normal appearance. He is not ill-appearing.  Cardiovascular:     Rate and Rhythm: Bradycardia present.     Heart sounds: Normal heart sounds, S1 normal and S2 normal. No murmur heard. Pulmonary:     Effort: Pulmonary effort is normal. No respiratory distress.     Breath sounds: Normal breath sounds. No wheezing.  Musculoskeletal:     Right lower leg: No edema.     Left lower leg: No edema.  Neurological:     Mental Status: He is alert. Mental status is at baseline.     Motor: No weakness.     Gait: Gait normal.     Comments: Left sided weakness at baseline.   Psychiatric:        Mood and Affect: Mood normal.        Behavior: Behavior normal.        Thought Content: Thought content normal.        Judgment: Judgment normal.    ECG completed, see media.   No results found for any visits on 11/10/24.    Assessment & Plan:  1. Dizziness (Primary) - Discussed with patient that symptoms are likely not from Eliquis , but likely due to bradycardia. Heart rate of 42 bpm likely due to Coreg . Advised patient to continue taking Eliquis  as prescribed. Consulted with cardiology, and she agreed with plan.  - Decreased Coreg  dose by half. - Ordered lab work to check potassium levels due to Lasix  use. - Patient has appointment with another provider at [facility] for a hospital follow-up. Advised patient to keep that appointment. If unable to follow up, advised to go to cardiology.  - Orthostatics completed in office, and were negative.  - EKG 12-Lead - Basic metabolic panel with GFR - CBC with Differential/Platelet  2. Atrial fibrillation, unspecified type (HCC) - ECG completed. Converted back to sinus rhythm. Prolonged QT interval. Patient to continue taking Eliquis  as prescribed.  - Basic metabolic panel with GFR - CBC with Differential/Platelet  3. Bradycardia - Bradycardia with heart rate of 42 bpm likely due to Coreg .  - Decreased Coreg  dose by half. - Ordered lab work to check potassium levels due to Lasix  use. - Basic metabolic panel with GFR - CBC with Differential/Platelet   Meds ordered this encounter  Medications   carvedilol  (COREG ) 3.125 MG tablet    Sig: Take 1 tablet (3.125 mg total) by mouth 2 (two) times daily with a meal.    Dispense:  60 tablet    Refill:  3     Return if symptoms worsen or fail to improve.   Damien KATHEE Pringle, FNP  Note:  This document was prepared using Dragon voice recognition software and may include unintentional dictation errors.   "

## 2024-11-10 NOTE — Telephone Encounter (Signed)
 FYI Only or Action Required?: Action required by provider: request for appointment.  Patient was last seen in primary care on 05/30/2024 by Edman Meade PEDLAR, FNP.  Called Nurse Triage reporting Medication Reaction.  Symptoms began several days ago.  Interventions attempted: Nothing.  Symptoms are: unchanged. Pt. States he is on 2 new medicines and  one of them is causing headace,dizziness and weakness.  Triage Disposition: Call PCP Now  Patient/caregiver understands and will follow disposition?: Yes       Summary: Allergic reaction to meds- dizziness, headache, weakness   Reason for Triage: The patient called in stating he has a hospital follow up next week but is very concerned about a medication he was prescribed while in the hospital as he has had a bad reaction to it. He states he was prescribed a few medications so he is not exactly sure which medication but he has had bad dizziness, headache and weakness. He would like to speak with a nurse. I will transfer him to [facility]         Reason for Disposition  [1] Caller has URGENT medicine question about med that primary care doctor (or NP/PA) or specialist prescribed AND [2] triager unable to answer question  Answer Assessment - Initial Assessment Questions 1. NAME of MEDICINE: What medicine(s) are you calling about?     Unsure It's a new medicine. 2. QUESTION: What is your question? (e.g., double dose of medicine, side effect)     Side effect 3. PRESCRIBER: Who prescribed the medicine? Reason: if prescribed by specialist, call should be referred to that group.     hospital 4. SYMPTOMS: Do you have any symptoms? If Yes, ask: What symptoms are you having?  How bad are the symptoms (e.g., mild, moderate, severe)     Headache, dizziness, weak 5. PREGNANCY:  Is there any chance that you are pregnant? When was your last menstrual period?     N/a  Protocols used: Medication Question Call-A-AH

## 2024-11-11 ENCOUNTER — Ambulatory Visit: Payer: Self-pay | Admitting: Student

## 2024-11-11 DIAGNOSIS — Z79899 Other long term (current) drug therapy: Secondary | ICD-10-CM

## 2024-11-11 LAB — BASIC METABOLIC PANEL WITH GFR
BUN/Creatinine Ratio: 10 (ref 10–24)
BUN: 28 mg/dL — ABNORMAL HIGH (ref 8–27)
CO2: 22 mmol/L (ref 20–29)
Calcium: 9.2 mg/dL (ref 8.6–10.2)
Chloride: 99 mmol/L (ref 96–106)
Creatinine, Ser: 2.77 mg/dL — ABNORMAL HIGH (ref 0.76–1.27)
Glucose: 87 mg/dL (ref 70–99)
Potassium: 3.1 mmol/L — ABNORMAL LOW (ref 3.5–5.2)
Sodium: 140 mmol/L (ref 134–144)
eGFR: 25 mL/min/{1.73_m2} — ABNORMAL LOW

## 2024-11-11 MED ORDER — FUROSEMIDE 20 MG PO TABS: 40.0000 mg | ORAL_TABLET | Freq: Every day | ORAL | 3 refills | Status: AC

## 2024-11-11 MED ORDER — POTASSIUM CHLORIDE CRYS ER 20 MEQ PO TBCR: EXTENDED_RELEASE_TABLET | ORAL | 3 refills | Status: AC

## 2024-11-11 NOTE — Telephone Encounter (Signed)
-----   Message from Brittany M Strader sent at 11/11/2024  8:31 AM EST ----- Please let the patient know his kidney function has worsened since hospital admission with creatinine increasing from 1.97 to 2.77. Potassium also low at 3.1. Start K-dur and would take 60 mEq today  and then 20 mEq daily. Reduce Lasix  to 40mg  daily. Recheck BMET next week.

## 2024-11-16 ENCOUNTER — Telehealth: Payer: Self-pay

## 2024-11-17 ENCOUNTER — Inpatient Hospital Stay: Payer: Self-pay | Admitting: Internal Medicine

## 2024-11-24 ENCOUNTER — Other Ambulatory Visit (HOSPITAL_COMMUNITY)

## 2024-12-01 ENCOUNTER — Inpatient Hospital Stay: Admitting: Internal Medicine

## 2025-01-05 ENCOUNTER — Other Ambulatory Visit (HOSPITAL_COMMUNITY)

## 2025-02-10 ENCOUNTER — Ambulatory Visit: Admitting: Cardiology

## 2025-02-21 ENCOUNTER — Ambulatory Visit
